# Patient Record
Sex: Female | Born: 1940 | Race: Black or African American | Hispanic: No | Smoking: Never smoker
Health system: Southern US, Community
[De-identification: ages and names within clinical notes are randomized; demographics above are authoritative.]

## PROBLEM LIST (undated history)

## (undated) ENCOUNTER — Emergency Department (HOSPITAL_COMMUNITY): Admission: EM | Payer: Medicare PPO | Source: Home / Self Care

## (undated) DIAGNOSIS — E785 Hyperlipidemia, unspecified: Secondary | ICD-10-CM

## (undated) DIAGNOSIS — I10 Essential (primary) hypertension: Secondary | ICD-10-CM

## (undated) HISTORY — PX: TUBAL LIGATION: SHX77

---

## 1999-07-17 ENCOUNTER — Other Ambulatory Visit: Admission: RE | Admit: 1999-07-17 | Discharge: 1999-07-17 | Payer: Self-pay | Admitting: Family Medicine

## 2000-02-20 ENCOUNTER — Encounter: Admission: RE | Admit: 2000-02-20 | Discharge: 2000-02-20 | Payer: Self-pay | Admitting: Family Medicine

## 2000-02-20 ENCOUNTER — Encounter: Payer: Self-pay | Admitting: Family Medicine

## 2000-05-27 ENCOUNTER — Encounter: Admission: RE | Admit: 2000-05-27 | Discharge: 2000-05-27 | Payer: Self-pay | Admitting: Family Medicine

## 2000-05-27 ENCOUNTER — Encounter: Payer: Self-pay | Admitting: Family Medicine

## 2000-06-04 ENCOUNTER — Encounter: Payer: Self-pay | Admitting: Family Medicine

## 2000-06-04 ENCOUNTER — Encounter: Admission: RE | Admit: 2000-06-04 | Discharge: 2000-06-04 | Payer: Self-pay | Admitting: Family Medicine

## 2000-12-08 ENCOUNTER — Other Ambulatory Visit: Admission: RE | Admit: 2000-12-08 | Discharge: 2000-12-08 | Payer: Self-pay | Admitting: Family Medicine

## 2001-12-10 ENCOUNTER — Encounter: Payer: Self-pay | Admitting: Family Medicine

## 2001-12-10 ENCOUNTER — Encounter: Admission: RE | Admit: 2001-12-10 | Discharge: 2001-12-10 | Payer: Self-pay | Admitting: Family Medicine

## 2002-08-25 ENCOUNTER — Encounter: Payer: Self-pay | Admitting: Family Medicine

## 2002-08-25 ENCOUNTER — Encounter: Admission: RE | Admit: 2002-08-25 | Discharge: 2002-08-25 | Payer: Self-pay | Admitting: Family Medicine

## 2003-05-26 ENCOUNTER — Encounter: Payer: Self-pay | Admitting: Family Medicine

## 2003-05-26 ENCOUNTER — Encounter: Admission: RE | Admit: 2003-05-26 | Discharge: 2003-05-26 | Payer: Self-pay | Admitting: Family Medicine

## 2003-08-22 ENCOUNTER — Encounter: Admission: RE | Admit: 2003-08-22 | Discharge: 2003-08-22 | Payer: Self-pay | Admitting: Family Medicine

## 2003-08-30 ENCOUNTER — Ambulatory Visit (HOSPITAL_COMMUNITY): Admission: RE | Admit: 2003-08-30 | Discharge: 2003-08-30 | Payer: Self-pay | Admitting: *Deleted

## 2003-09-26 ENCOUNTER — Encounter: Admission: RE | Admit: 2003-09-26 | Discharge: 2003-09-26 | Payer: Self-pay | Admitting: *Deleted

## 2004-10-30 ENCOUNTER — Ambulatory Visit (HOSPITAL_COMMUNITY): Admission: RE | Admit: 2004-10-30 | Discharge: 2004-10-30 | Payer: Self-pay | Admitting: Orthopedic Surgery

## 2004-10-30 ENCOUNTER — Ambulatory Visit (HOSPITAL_BASED_OUTPATIENT_CLINIC_OR_DEPARTMENT_OTHER): Admission: RE | Admit: 2004-10-30 | Discharge: 2004-10-30 | Payer: Self-pay | Admitting: Orthopedic Surgery

## 2005-09-09 ENCOUNTER — Encounter: Admission: RE | Admit: 2005-09-09 | Discharge: 2005-09-09 | Payer: Self-pay | Admitting: Family Medicine

## 2005-11-10 ENCOUNTER — Encounter: Admission: RE | Admit: 2005-11-10 | Discharge: 2005-11-10 | Payer: Self-pay | Admitting: Family Medicine

## 2006-11-10 ENCOUNTER — Emergency Department (HOSPITAL_COMMUNITY): Admission: EM | Admit: 2006-11-10 | Discharge: 2006-11-10 | Payer: Self-pay | Admitting: Emergency Medicine

## 2008-12-21 ENCOUNTER — Encounter: Admission: RE | Admit: 2008-12-21 | Discharge: 2008-12-21 | Payer: Self-pay | Admitting: Family Medicine

## 2010-08-13 ENCOUNTER — Emergency Department (HOSPITAL_COMMUNITY): Admission: EM | Admit: 2010-08-13 | Discharge: 2010-08-13 | Payer: Self-pay | Admitting: Emergency Medicine

## 2010-11-17 ENCOUNTER — Encounter: Payer: Self-pay | Admitting: Family Medicine

## 2011-02-18 ENCOUNTER — Other Ambulatory Visit: Payer: Self-pay | Admitting: Gastroenterology

## 2011-02-20 ENCOUNTER — Ambulatory Visit
Admission: RE | Admit: 2011-02-20 | Discharge: 2011-02-20 | Disposition: A | Discharge status: Home / Self Care | Payer: Medicare Other | Source: Ambulatory Visit | Attending: Gastroenterology | Admitting: Gastroenterology

## 2011-02-20 MED ORDER — IOHEXOL 300 MG/ML  SOLN
125.0000 mL | Freq: Once | INTRAMUSCULAR | Status: AC | PRN
  Administered 2011-02-20: 125 mL via INTRAVENOUS

## 2011-02-28 ENCOUNTER — Other Ambulatory Visit: Payer: Self-pay

## 2011-03-12 ENCOUNTER — Ambulatory Visit (HOSPITAL_COMMUNITY)
Admission: RE | Admit: 2011-03-12 | Discharge: 2011-03-12 | Disposition: A | Discharge status: Home / Self Care | Payer: Medicare Other | Source: Ambulatory Visit | Attending: Gastroenterology | Admitting: Gastroenterology

## 2011-03-12 DIAGNOSIS — K8020 Calculus of gallbladder without cholecystitis without obstruction: Secondary | ICD-10-CM | POA: Insufficient documentation

## 2011-03-12 DIAGNOSIS — I10 Essential (primary) hypertension: Secondary | ICD-10-CM | POA: Insufficient documentation

## 2011-03-12 DIAGNOSIS — E669 Obesity, unspecified: Secondary | ICD-10-CM | POA: Insufficient documentation

## 2011-03-12 DIAGNOSIS — K838 Other specified diseases of biliary tract: Secondary | ICD-10-CM | POA: Insufficient documentation

## 2011-03-12 DIAGNOSIS — K319 Disease of stomach and duodenum, unspecified: Secondary | ICD-10-CM | POA: Insufficient documentation

## 2011-03-12 DIAGNOSIS — E7800 Pure hypercholesterolemia, unspecified: Secondary | ICD-10-CM | POA: Insufficient documentation

## 2011-03-12 DIAGNOSIS — R1084 Generalized abdominal pain: Secondary | ICD-10-CM | POA: Insufficient documentation

## 2011-03-12 DIAGNOSIS — R1310 Dysphagia, unspecified: Secondary | ICD-10-CM | POA: Insufficient documentation

## 2011-03-14 NOTE — Op Note (Signed)
NAMEJESSIKA, Samantha Gonzales               ACCOUNT NO.:  [id number]   MEDICAL RECORD NO.:  [id number]          PATIENT TYPE:  AMB   LOCATION:  DSC                          FACILITY:  [facility]   PHYSICIAN:  Harvie Junior, M.D.   DATE OF BIRTH:  May 12, 1941   DATE OF PROCEDURE:  10/31/2003  DATE OF DISCHARGE:                                 OPERATIVE REPORT   PREOPERATIVE DIAGNOSIS:  Medial meniscal tear.   POSTOPERATIVE DIAGNOSES:  1.  Medial meniscal tear with corresponding chondromalacia of the medial      femoral condyle.  2.  Chondromalacia of the patellofemoral joint.   PRINCIPAL PROCEDURE:  1.  Partial posterior horn medial meniscectomy with corresponding      debridement of the medial compartment.  2.  Debridement of chondromalacia of the patellofemoral compartment,      specifically, the medial patella facet.   SURGEON:  Harvie Junior, M.D.   ASSISTANT:  Marshia Ly, P.A.   ANESTHESIA:  General.   BRIEF HISTORY:  She is a 70 year old female with a long history of having  had right knee pain.  She ultimately will be evaluated with MRI which showed  that she had a posterior horn medial meniscal tear.  I tried her  conservatively with anti-inflammatory medication, activity modification,  steroid injection.  All this failed and, because of continuing complaints of  pain, she will also be taken to the operating room for operative knee  arthroscopy.   DESCRIPTION OF PROCEDURE:  The patient was taken to the operating room.  After adequate anesthesia was obtained with general anesthetic, the patient  was placed on the operating table.  The right leg was prepped and draped in  the usual sterile fashion.  Following this, routine arthroscopic examination  of the knee revealed that there was an obvious posterior horn medial  meniscal tear.  This was debrided back to a smooth and stable rim.  It is a  complex tear which really went almost back to the posterior rim.  Once it  was  debrided, a probe was used to make sure that the meniscus was stable.  The suction shaver was used to contour the rim.  Attention was then turned  to the ACL normal, lateral side normal.  Attention turned to the  patellofemoral joint in the patellofemoral area.  There was a little bit of  a medial plica which was protecting what appeared to be some chondromalacia  of the medial patella facet.  This was debrided with a suction shaver back  to smooth and stable rim.  At this point, the knee was copiously  irrigated, suctioned, and dried.  The arthroscopic portals were closed with  a bandage.  A sterile and compressive dressing was applied and the patient  was taken to the recovery room.  She was noted to be in satisfactory  condition.  Estimated blood loss for the procedure was none.      JLG/MEDQ  D:  10/30/2004  T:  10/30/2004  Job:  [id number]

## 2011-05-19 ENCOUNTER — Ambulatory Visit
Admission: RE | Admit: 2011-05-19 | Discharge: 2011-05-19 | Disposition: A | Discharge status: Home / Self Care | Payer: Medicare Other | Source: Ambulatory Visit | Attending: Gastroenterology | Admitting: Gastroenterology

## 2011-12-25 ENCOUNTER — Ambulatory Visit (HOSPITAL_COMMUNITY)
Admission: RE | Admit: 2011-12-25 | Discharge: 2011-12-25 | Disposition: A | Discharge status: Home / Self Care | Payer: Medicare Other | Source: Ambulatory Visit | Attending: Internal Medicine | Admitting: Internal Medicine

## 2011-12-25 DIAGNOSIS — M79609 Pain in unspecified limb: Secondary | ICD-10-CM

## 2011-12-25 DIAGNOSIS — R609 Edema, unspecified: Secondary | ICD-10-CM

## 2011-12-25 DIAGNOSIS — R52 Pain, unspecified: Secondary | ICD-10-CM

## 2011-12-25 NOTE — Progress Notes (Signed)
VASCULAR LAB PRELIMINARY  PRELIMINARY  PRELIMINARY  PRELIMINARY  Left lower extremity venous duplex completed.    Preliminary report: Left leg is negative for superficial and deep vein thrombosis. Vanna Scotland, 12/25/2011, 7:01 PM

## 2013-11-03 ENCOUNTER — Other Ambulatory Visit (HOSPITAL_COMMUNITY): Payer: Self-pay | Admitting: Internal Medicine

## 2013-11-03 DIAGNOSIS — Z1231 Encounter for screening mammogram for malignant neoplasm of breast: Secondary | ICD-10-CM

## 2013-11-04 ENCOUNTER — Ambulatory Visit (HOSPITAL_COMMUNITY)
Admission: RE | Admit: 2013-11-04 | Discharge: 2013-11-04 | Disposition: A | Discharge status: Home / Self Care | Payer: Medicare HMO | Source: Ambulatory Visit | Attending: Internal Medicine | Admitting: Internal Medicine

## 2013-11-04 DIAGNOSIS — Z1231 Encounter for screening mammogram for malignant neoplasm of breast: Secondary | ICD-10-CM | POA: Insufficient documentation

## 2014-02-07 ENCOUNTER — Emergency Department (INDEPENDENT_AMBULATORY_CARE_PROVIDER_SITE_OTHER): Payer: Medicare HMO

## 2014-02-07 ENCOUNTER — Encounter (HOSPITAL_COMMUNITY): Payer: Self-pay | Admitting: Emergency Medicine

## 2014-02-07 ENCOUNTER — Emergency Department (INDEPENDENT_AMBULATORY_CARE_PROVIDER_SITE_OTHER)
Admission: EM | Admit: 2014-02-07 | Discharge: 2014-02-07 | Disposition: A | Discharge status: Home / Self Care | Payer: Self-pay | Source: Home / Self Care | Attending: Family Medicine | Admitting: Family Medicine

## 2014-02-07 DIAGNOSIS — S5000XA Contusion of unspecified elbow, initial encounter: Secondary | ICD-10-CM

## 2014-02-07 DIAGNOSIS — S8000XA Contusion of unspecified knee, initial encounter: Secondary | ICD-10-CM

## 2014-02-07 DIAGNOSIS — Y92009 Unspecified place in unspecified non-institutional (private) residence as the place of occurrence of the external cause: Secondary | ICD-10-CM

## 2014-02-07 DIAGNOSIS — W19XXXA Unspecified fall, initial encounter: Secondary | ICD-10-CM

## 2014-02-07 HISTORY — DX: Essential (primary) hypertension: I10

## 2014-02-07 MED ORDER — NAPROXEN 500 MG PO TABS: 500.0000 mg | ORAL_TABLET | Freq: Two times a day (BID) | ORAL | Status: DC

## 2014-02-07 MED ORDER — TRAMADOL HCL 50 MG PO TABS: 50.0000 mg | ORAL_TABLET | Freq: Four times a day (QID) | ORAL | Status: DC | PRN

## 2014-02-07 NOTE — Discharge Instructions (Signed)

## 2014-02-07 NOTE — ED Provider Notes (Signed)
CSN: [id number]     Arrival date & time 02/07/14  1441 History   First MD Initiated Contact with Patient 02/07/14 1616     Chief Complaint  Patient presents with  . Fall   (Consider location/radiation/quality/duration/timing/severity/associated sxs/prior Treatment) HPI Comments: 73 year old female presents complaining of right knee pain, left elbow pain after falling in her hallway earlier today. She was walking when her foot caught on the ground and she fell. She had immediate pain in the knee and elbow, as well as a slight pain in her head that has now resolved. She has been ambulatory but with severe pain. She has no numbness anywhere. The right knee seems to be swelling. No other injuries. No loss of consciousness. No NVD or blurry vision.  Patient is a 73 y.o. female presenting with fall.  Fall Associated symptoms include headaches (resolved).    Past Medical History  Diagnosis Date  . Hypertension    History reviewed. No pertinent past surgical history. History reviewed. No pertinent family history. History  Substance Use Topics  . Smoking status: Never Smoker   . Smokeless tobacco: Not on file  . Alcohol Use: No   OB History   Grav Para Term Preterm Abortions TAB SAB Ect Mult Living                 Review of Systems  Eyes: Negative for visual disturbance.  Gastrointestinal: Negative for nausea and vomiting.  Musculoskeletal:       See HPI  Neurological: Positive for headaches (resolved). Negative for dizziness.  All other systems reviewed and are negative.   Allergies  Penicillins  Home Medications   Prior to Admission medications   Not on File   BP 155/80  Pulse 76  Temp(Src) 98.7 F (37.1 C) (Oral)  Resp 18  SpO2 100% Physical Exam  Nursing note and vitals reviewed. Constitutional: She is oriented to person, place, and time. Vital signs are normal. She appears well-developed and well-nourished. She is active and cooperative. She does not appear ill.  No distress.  HENT:  Head: Normocephalic and atraumatic.  Pulmonary/Chest: Effort normal. No respiratory distress.  Musculoskeletal:       Left elbow: She exhibits normal range of motion, no swelling and no effusion. Tenderness found. Olecranon process tenderness noted.       Right knee: She exhibits decreased range of motion, swelling (diffuse, worse over patella), ecchymosis (prepatellar), abnormal patellar mobility and bony tenderness (patella). She exhibits normal alignment, no LCL laxity and no MCL laxity. Tenderness found. Medial joint line, lateral joint line and patellar tendon tenderness noted.  Neurological: She is alert and oriented to person, place, and time. She has normal strength. No cranial nerve deficit or sensory deficit. She exhibits normal muscle tone. Coordination normal. GCS eye subscore is 4. GCS verbal subscore is 5. GCS motor subscore is 6.  Skin: Skin is warm and dry. No rash noted. She is not diaphoretic.  Psychiatric: She has a normal mood and affect. Judgment normal.    ED Course  Procedures (including critical care time) Labs Review Labs Reviewed - No data to display  No results found for this or any previous visit. Imaging Review Dg Elbow Complete Left  02/07/2014   CLINICAL DATA:  Left elbow pain status post fall.  EXAM: LEFT ELBOW - COMPLETE 3+ VIEW  COMPARISON:  None.  FINDINGS: The radial head appears intact. The olecranon is also intact. There is a small olecranon spur. The supracondylar region exhibits no acute  abnormality. There is minimal spurring from the medial humeral epicondyles. There is no joint effusion.  IMPRESSION: There is no evidence of an acute fracture of the bones of the elbow. There is small olecranon spur.   Electronically Signed   By: David  SwazilandJordan   On: 02/07/2014 16:51   Dg Knee Complete 4 Views Right  02/07/2014   CLINICAL DATA:  Fall.  EXAM: RIGHT KNEE - COMPLETE 4+ VIEW  COMPARISON:  None.  FINDINGS: There is no evidence of fracture,  dislocation, or joint effusion. There are mild degenerative changes involving the right knee including medial compartment narrowing and marginal spur formation. Soft tissues are unremarkable.  IMPRESSION: 1. Osteoarthritis. 2. No acute findings.   Electronically Signed   By: Signa Kellaylor  Stroud M.D.   On: 02/07/2014 17:04     MDM   1. Fall   2. Knee contusion   3. Elbow contusion    X-rays negative. Treating symptomatically. RICE. Followup if no improvement in the week   Meds ordered this encounter  Medications  . naproxen (NAPROSYN) 500 MG tablet    Sig: Take 1 tablet (500 mg total) by mouth 2 (two) times daily.    Dispense:  30 tablet    Refill:  0    Order Specific Question:  Supervising Provider    Answer:  Linna HoffKINDL, JAMES D [id number]]  . traMADol (ULTRAM) 50 MG tablet    Sig: Take 1 tablet (50 mg total) by mouth every 6 (six) hours as needed.    Dispense:  15 tablet    Refill:  0    Order Specific Question:  Supervising Provider    Answer:  Bradd CanaryKINDL, JAMES D [[id number]]       Graylon GoodZachary H Dustin Bumbaugh, PA-C 02/07/14 1723

## 2014-02-07 NOTE — ED Notes (Signed)
Reports walking down hall way and falling to the floor.  Denies tripping over anything.   Pain in right knee and left elbow.  Incident happened today.

## 2014-02-10 NOTE — ED Provider Notes (Signed)
Medical screening examination/treatment/procedure(s) were performed by resident physician or non-physician practitioner and as supervising physician I was immediately available for consultation/collaboration.   Barkley BrunsKINDL,Zhania Shaheen DOUGLAS MD.   Linna HoffJames D Coulton Schlink, MD 02/10/14 [phone]

## 2014-11-01 ENCOUNTER — Other Ambulatory Visit: Payer: Self-pay | Admitting: Pain Medicine

## 2014-11-01 DIAGNOSIS — M545 Low back pain, unspecified: Secondary | ICD-10-CM

## 2014-11-01 DIAGNOSIS — M4686 Other specified inflammatory spondylopathies, lumbar region: Secondary | ICD-10-CM

## 2014-11-01 DIAGNOSIS — M79606 Pain in leg, unspecified: Secondary | ICD-10-CM

## 2014-11-02 ENCOUNTER — Ambulatory Visit
Admission: RE | Admit: 2014-11-02 | Discharge: 2014-11-02 | Disposition: A | Discharge status: Home / Self Care | Payer: Medicare HMO | Source: Ambulatory Visit | Attending: Pain Medicine | Admitting: Pain Medicine

## 2014-11-02 DIAGNOSIS — M79606 Pain in leg, unspecified: Secondary | ICD-10-CM

## 2014-11-02 DIAGNOSIS — M545 Low back pain, unspecified: Secondary | ICD-10-CM

## 2014-11-02 DIAGNOSIS — M4686 Other specified inflammatory spondylopathies, lumbar region: Secondary | ICD-10-CM

## 2015-04-20 ENCOUNTER — Encounter: Payer: Self-pay | Admitting: Gastroenterology

## 2015-09-27 ENCOUNTER — Other Ambulatory Visit: Payer: Self-pay | Admitting: Gastroenterology

## 2015-09-27 DIAGNOSIS — R1084 Generalized abdominal pain: Secondary | ICD-10-CM

## 2015-09-27 DIAGNOSIS — R634 Abnormal weight loss: Secondary | ICD-10-CM

## 2015-09-28 ENCOUNTER — Ambulatory Visit
Admission: RE | Admit: 2015-09-28 | Discharge: 2015-09-28 | Disposition: A | Discharge status: Home / Self Care | Payer: Medicare HMO | Source: Ambulatory Visit | Attending: Gastroenterology | Admitting: Gastroenterology

## 2015-09-28 DIAGNOSIS — R634 Abnormal weight loss: Secondary | ICD-10-CM

## 2015-09-28 DIAGNOSIS — R1084 Generalized abdominal pain: Secondary | ICD-10-CM

## 2015-09-28 MED ORDER — IOPAMIDOL (ISOVUE-300) INJECTION 61%: 100.0000 mL | Freq: Once | INTRAVENOUS | Status: DC | PRN

## 2018-01-17 ENCOUNTER — Emergency Department (HOSPITAL_COMMUNITY): Payer: Medicare Other

## 2018-01-17 ENCOUNTER — Other Ambulatory Visit: Payer: Self-pay

## 2018-01-17 ENCOUNTER — Inpatient Hospital Stay (HOSPITAL_COMMUNITY)
Admission: EM | Admit: 2018-01-17 | Discharge: 2018-01-20 | DRG: 641 | Disposition: A | Discharge status: Home / Self Care | Payer: Medicare Other | Attending: Internal Medicine | Admitting: Internal Medicine

## 2018-01-17 ENCOUNTER — Encounter (HOSPITAL_COMMUNITY): Payer: Self-pay | Admitting: Emergency Medicine

## 2018-01-17 ENCOUNTER — Observation Stay (HOSPITAL_COMMUNITY): Payer: Medicare Other

## 2018-01-17 DIAGNOSIS — R4182 Altered mental status, unspecified: Secondary | ICD-10-CM | POA: Diagnosis not present

## 2018-01-17 DIAGNOSIS — Y92009 Unspecified place in unspecified non-institutional (private) residence as the place of occurrence of the external cause: Secondary | ICD-10-CM

## 2018-01-17 DIAGNOSIS — M549 Dorsalgia, unspecified: Secondary | ICD-10-CM | POA: Diagnosis present

## 2018-01-17 DIAGNOSIS — Z880 Allergy status to penicillin: Secondary | ICD-10-CM

## 2018-01-17 DIAGNOSIS — N179 Acute kidney failure, unspecified: Secondary | ICD-10-CM | POA: Diagnosis present

## 2018-01-17 DIAGNOSIS — G8929 Other chronic pain: Secondary | ICD-10-CM | POA: Diagnosis present

## 2018-01-17 DIAGNOSIS — E519 Thiamine deficiency, unspecified: Secondary | ICD-10-CM | POA: Diagnosis present

## 2018-01-17 DIAGNOSIS — E538 Deficiency of other specified B group vitamins: Principal | ICD-10-CM | POA: Diagnosis present

## 2018-01-17 DIAGNOSIS — R269 Unspecified abnormalities of gait and mobility: Secondary | ICD-10-CM

## 2018-01-17 DIAGNOSIS — R634 Abnormal weight loss: Secondary | ICD-10-CM | POA: Diagnosis present

## 2018-01-17 DIAGNOSIS — R410 Disorientation, unspecified: Secondary | ICD-10-CM

## 2018-01-17 DIAGNOSIS — Z79891 Long term (current) use of opiate analgesic: Secondary | ICD-10-CM

## 2018-01-17 DIAGNOSIS — R262 Difficulty in walking, not elsewhere classified: Secondary | ICD-10-CM | POA: Diagnosis present

## 2018-01-17 DIAGNOSIS — R270 Ataxia, unspecified: Secondary | ICD-10-CM | POA: Diagnosis present

## 2018-01-17 DIAGNOSIS — R109 Unspecified abdominal pain: Secondary | ICD-10-CM | POA: Diagnosis present

## 2018-01-17 DIAGNOSIS — T50905A Adverse effect of unspecified drugs, medicaments and biological substances, initial encounter: Secondary | ICD-10-CM | POA: Diagnosis present

## 2018-01-17 DIAGNOSIS — F17220 Nicotine dependence, chewing tobacco, uncomplicated: Secondary | ICD-10-CM | POA: Diagnosis present

## 2018-01-17 DIAGNOSIS — Z79899 Other long term (current) drug therapy: Secondary | ICD-10-CM

## 2018-01-17 DIAGNOSIS — R52 Pain, unspecified: Secondary | ICD-10-CM

## 2018-01-17 DIAGNOSIS — I10 Essential (primary) hypertension: Secondary | ICD-10-CM | POA: Diagnosis present

## 2018-01-17 LAB — TROPONIN I: Troponin I: <0.03 ng/mL (ref <0.03)

## 2018-01-17 LAB — COMPREHENSIVE METABOLIC PANEL
ALBUMIN: 3.5 g/dL (ref 3.5–5.0)
ALK PHOS: 71 U/L (ref 38–126)
ALT: 11 U/L — ABNORMAL LOW (ref 14–54)
AST: 24 U/L (ref 15–41)
Anion gap: 11 (ref 5–15)
BILIRUBIN TOTAL: 0.6 mg/dL (ref 0.3–1.2)
BUN: 17 mg/dL (ref 6–20)
CALCIUM: 9.3 mg/dL (ref 8.9–10.3)
CO2: 25 mmol/L (ref 22–32)
Chloride: 103 mmol/L (ref 101–111)
Creatinine, Ser: 1.37 mg/dL — ABNORMAL HIGH (ref 0.44–1.00)
GFR calc Af Amer: 43 mL/min — ABNORMAL LOW (ref >60)
GFR calc non Af Amer: 37 mL/min — ABNORMAL LOW (ref >60)
GLUCOSE: 107 mg/dL — AB (ref 65–99)
POTASSIUM: 3.5 mmol/L (ref 3.5–5.1)
Sodium: 139 mmol/L (ref 135–145)
TOTAL PROTEIN: 6.6 g/dL (ref 6.5–8.1)

## 2018-01-17 LAB — URINALYSIS, ROUTINE W REFLEX MICROSCOPIC
Bilirubin Urine: NEGATIVE
Glucose, UA: NEGATIVE mg/dL
Hgb urine dipstick: NEGATIVE
Ketones, ur: NEGATIVE mg/dL
Nitrite: NEGATIVE
Protein, ur: 30 mg/dL — AB
Specific Gravity, Urine: 1.023 (ref 1.005–1.030)
pH: 5 (ref 5.0–8.0)

## 2018-01-17 LAB — CBC
HCT: 39 % (ref 36.0–46.0)
Hemoglobin: 12.9 g/dL (ref 12.0–15.0)
MCH: 32.9 pg (ref 26.0–34.0)
MCHC: 33.1 g/dL (ref 30.0–36.0)
MCV: 99.5 fL (ref 78.0–100.0)
Platelets: 207 10*3/uL (ref 150–400)
RBC: 3.92 MIL/uL (ref 3.87–5.11)
RDW: 14 % (ref 11.5–15.5)
WBC: 7.6 10*3/uL (ref 4.0–10.5)

## 2018-01-17 LAB — I-STAT CG4 LACTIC ACID, ED: LACTIC ACID, VENOUS: 1.04 mmol/L (ref 0.5–1.9)

## 2018-01-17 LAB — AMMONIA: Ammonia: 53 umol/L — ABNORMAL HIGH (ref 9–35)

## 2018-01-17 MED ORDER — SENNOSIDES-DOCUSATE SODIUM 8.6-50 MG PO TABS: 1.0000 | ORAL_TABLET | Freq: Every evening | ORAL | Status: DC | PRN

## 2018-01-17 MED ORDER — TRAMADOL HCL 50 MG PO TABS
50.0000 mg | ORAL_TABLET | Freq: Once | ORAL | Status: AC
  Administered 2018-01-17: 50 mg via ORAL
  Filled 2018-01-17: qty 1

## 2018-01-17 MED ORDER — STROKE: EARLY STAGES OF RECOVERY BOOK
Freq: Once | Status: AC
  Administered 2018-01-17: 21:00:00

## 2018-01-17 MED ORDER — HEPARIN SODIUM (PORCINE) 5000 UNIT/ML IJ SOLN
5000.0000 [IU] | Freq: Three times a day (TID) | INTRAMUSCULAR | Status: DC
  Administered 2018-01-17 – 2018-01-20 (×8): 5000 [IU] via SUBCUTANEOUS
  Filled 2018-01-17 (×8): qty 1

## 2018-01-17 MED ORDER — SODIUM CHLORIDE 0.9 % IV SOLN
INTRAVENOUS | Status: DC
  Administered 2018-01-17: 21:00:00 via INTRAVENOUS

## 2018-01-17 MED ORDER — PANTOPRAZOLE SODIUM 40 MG IV SOLR
40.0000 mg | INTRAVENOUS | Status: DC
  Administered 2018-01-17 – 2018-01-18 (×2): 40 mg via INTRAVENOUS
  Filled 2018-01-17 (×2): qty 40

## 2018-01-17 MED ORDER — SODIUM CHLORIDE 0.9 % IV SOLN
INTRAVENOUS | Status: DC
  Administered 2018-01-17: 250 mL/h via INTRAVENOUS

## 2018-01-17 MED ORDER — ACETAMINOPHEN 500 MG PO TABS
1000.0000 mg | ORAL_TABLET | Freq: Three times a day (TID) | ORAL | Status: DC
  Administered 2018-01-17 – 2018-01-18 (×2): 1000 mg via ORAL
  Filled 2018-01-17 (×2): qty 2

## 2018-01-17 NOTE — ED Notes (Signed)
Attempted report 

## 2018-01-17 NOTE — ED Provider Notes (Signed)
[facility] EMERGENCY DEPARTMENT Provider Note   CSN: [id number] Arrival date & time: 01/17/18  1033     History   Chief Complaint Chief Complaint  Patient presents with  . Altered Mental Status    HPI Samantha Gonzales is a 77 y.o. female.  HPI Patient presents to the emergency department with unsteady gait and altered mental status.  The daughter gives the history that the patient seems confused and she is not able to walk over the last 2 days.  Patient states that she is only having back pain at this time.  She denies any others symptoms.  The patient denies chest pain, shortness of breath, headache,blurred vision, neck pain, fever, cough,numbness, dizziness, anorexia, edema, abdominal pain, nausea, vomiting, diarrhea, rash, back pain, dysuria, hematemesis, bloody stool, near syncope, or syncope. Past Medical History:  Diagnosis Date  . Hypertension     There are no active problems to display for this patient.   History reviewed. No pertinent surgical history.   OB History   None      Home Medications    Prior to Admission medications   Medication Sig Start Date End Date Taking? Authorizing Provider  HYDROcodone-acetaminophen (NORCO) 7.5-325 MG tablet Take 1 tablet by mouth 3 (three) times daily as needed for pain. 12/24/17  Yes [provider]  naproxen (NAPROSYN) 500 MG tablet Take 1 tablet (500 mg total) by mouth 2 (two) times daily. Patient not taking: Reported on 01/17/2018 02/07/14   Graylon Good, PA-C  traMADol (ULTRAM) 50 MG tablet Take 1 tablet (50 mg total) by mouth every 6 (six) hours as needed. Patient not taking: Reported on 01/17/2018 02/07/14   Graylon Good, PA-C    Family History No family history on file.  Social History Social History   Tobacco Use  . Smoking status: Never Smoker  . Smokeless tobacco: Never Used  Substance Use Topics  . Alcohol use: No  . Drug use: No     Allergies    Penicillins   Review of Systems Review of Systems All other systems negative except as documented in the HPI. All pertinent positives and negatives as reviewed in the HPI.  Physical Exam Updated Vital Signs BP (!) 144/70 (BP Location: Right Arm)   Pulse (!) 46   Temp 98.4 F (36.9 C) (Oral)   Resp 14   SpO2 98%   Physical Exam  Constitutional: She is oriented to person, place, and time. She appears well-developed and well-nourished. No distress.  HENT:  Head: Normocephalic and atraumatic.  Mouth/Throat: Oropharynx is clear and moist.  Eyes: Pupils are equal, round, and reactive to light.  Neck: Normal range of motion. Neck supple.  Cardiovascular: Normal rate, regular rhythm and normal heart sounds. Exam reveals no gallop and no friction rub.  No murmur heard. Pulmonary/Chest: Effort normal and breath sounds normal. No respiratory distress. She has no wheezes.  Abdominal: Soft. Bowel sounds are normal. She exhibits no distension. There is no tenderness.  Neurological: She is alert and oriented to person, place, and time. She exhibits normal muscle tone. Coordination normal.  Skin: Skin is warm and dry. Capillary refill takes less than 2 seconds. No rash noted. No erythema.  Psychiatric: She has a normal mood and affect. Her behavior is normal.  Nursing note and vitals reviewed.    ED Treatments / Results  Labs (all labs ordered are listed, but only abnormal results are displayed) Labs Reviewed  COMPREHENSIVE METABOLIC PANEL - Abnormal; Notable  for the following components:      Result Value   Glucose, Bld 107 (*)    Creatinine, Ser 1.37 (*)    ALT 11 (*)    GFR calc non Af Amer 37 (*)    GFR calc Af Amer 43 (*)    All other components within normal limits  URINE CULTURE  CBC  AMMONIA  URINALYSIS, ROUTINE W REFLEX MICROSCOPIC  CBG MONITORING, ED  I-STAT CG4 LACTIC ACID, ED  I-STAT CG4 LACTIC ACID, ED    EKG None  Radiology Dg Chest 2 View  Result Date:  01/17/2018 CLINICAL DATA:  Balance problems with altered mental status 2 days. EXAM: CHEST - 2 VIEW COMPARISON:  None. FINDINGS: Lungs are adequately inflated without focal airspace consolidation or effusion. Mild cardiomegaly. Calcified plaque over the thoracic aorta. Degenerative change of the spine and shoulders. IMPRESSION: No acute cardiopulmonary disease. Mild cardiomegaly. Electronically Signed   By: Elberta Fortisaniel  Boyle M.D.   On: 01/17/2018 15:54   Dg Cervical Spine Complete  Result Date: 01/17/2018 CLINICAL DATA:  Altered mental status 2 days. EXAM: CERVICAL SPINE - COMPLETE 4+ VIEW COMPARISON:  None. FINDINGS: Vertebral body alignment and heights are normal. There is moderate spondylosis throughout the cervical spine. There is mild disc space narrowing at the C3-4, C4-5, C5-6 and C6-7 levels. Prevertebral soft tissues are within normal. There is moderate uncovertebral joint spurring and facet arthropathy. There is suboptimal positioning for proper evaluation of the right-sided neural foramina. There is moderate narrowing of the left neural foramina at the C4-5 and C6-7 levels. Atlantoaxial articulation is within normal. Hardware is present over the mandible. Two metallic densities project over the region of the left maxilla. IMPRESSION: No acute findings. 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 Suboptimal positioning for accurate evaluation of the right neural foramina. Electronically Signed   By: Elberta Fortisaniel  Boyle M.D.   On: 01/17/2018 15:57   Ct Head Wo Contrast  Result Date: 01/17/2018 CLINICAL DATA:  Altered mental status EXAM: CT HEAD WITHOUT CONTRAST TECHNIQUE: Contiguous axial images were obtained from the base of the skull through the vertex without intravenous contrast. COMPARISON:  None. FINDINGS: Brain: Mild atrophic changes are noted. No findings to suggest acute  hemorrhage, acute infarction or mass lesion are noted. Vascular: No hyperdense vessel or unexpected calcification. Skull: Normal. Negative for fracture or focal lesion. Sinuses/Orbits: No acute finding. Other: None. IMPRESSION: Mild atrophic changes without acute abnormality. Electronically Signed   By: Alcide CleverMark  Lukens M.D.   On: 01/17/2018 15:59    Procedures Procedures (including critical care time)  Medications Ordered in ED Medications  0.9 %  sodium chloride infusion (has no administration in time range)     Initial Impression / Assessment and Plan / ED Course  I have reviewed the triage vital signs and the nursing notes.  Pertinent labs & imaging results that were available during my care of the patient were reviewed by me and considered in my medical decision making (see chart for details).     Patient was not able to ambulate and there is a concern due to the fact that she has had some dizziness along with inability to walk that she has had a stroke.  I did consult the hospitalist who will see the patient I did order an MRI of her brain due to the concern for stroke.  Patient will be admitted and advised them  of the plan. Final Clinical Impressions(s) / ED Diagnoses   Final diagnoses:  None    ED Discharge Orders    None       Charlestine Night, PA-C 01/18/18 0015    Rolan Bucco, MD 01/18/18 [phone]

## 2018-01-17 NOTE — H&P (Signed)
History and Physical   Samantha Gonzales:[id number] DOB: Dec 15, 1940 DOA: 01/17/2018  PCP: System, Pcp Not In  Chief Complaint: confusion  HPI: this is a 77 year old woman with medical problems including chronic back pain on opioid therapy, chronic abdominal pain, hypertension, and longstanding chewing tobacco user presenting with 2 day history of confusion and difficulty walking.  Note history is obtained by the patient's family at bedside including her daughter and her sister, patient's own account is somewhat limited due to her cognitive state.  At baseline, the patient is independent in her ADLs and IADLs. She walks without assistance. However, 2 days ago she suffered an unwitnessed fall, he fell on her bilateral knees. Since then she's been unsteady on her feet, leaning against household objects to maintain her balance. Has had significant difficulty ambulating. Family reports that she's been somewhat confused, not oriented to year. Her conversation at times has not been appropriate, and she's been noted to be stumbling, family reports that she's also been dizzy. The patient cannot report that she takes for her medications, but does report she is followed by pain management clinic and takes a medication prescribed to 3 times daily. She denies any recent increase in her pain medications that are prescribed however, she does report chronic abdominal pain that she takes frequent over-the-counter medications including NSAIDs. She does not drink alcohol.  Family at bedside to report that she's been evaluated with an EGD as well as colonoscopy, this was over 1 year ago and did not reveal any acute abnormalities. The patient denies any fevers, chills, chest pain, shortness of breath, nausea, vomiting. Her bowel pain is poorly localized, she has bowel movements to 3 times weekly, describes soft.She takes a laxative as needed. She reports back pain as thoracic in location, rates a 5 out of 10.  Other  details include, she is educated through the eighth grade, formerly worked in [employer], never smoker, chronically chews tobacco. Formerly enjoyed working in her garden however she had to stop due to her chronic back pain.  ED Course:  Vital signs remarkable for heart rate ranging from 46-105, systolic blood pressure remained over 130 mm Hg, respiratory rate and oxygen saturation normal.  Diagnostics remarkable for pain of 1.37, which is elevated from baseline. Normal lactic acid. CBC unremarkable.  Chest x-ray without acute findings. Radiograph cervical spine without acute findings.CT scan of the head without contrast revealed mild atrophic changes without acute normality. Emergency medicine team's concern for CVA given that we may attempt to send the patient up she was unsteady on her feet, required much support. This accommodation confusion MRI was ordered. Hospital medicine consult further management.  Ammonia 53  Review of Systems: A complete ROS was obtained; pertinent positives negatives are denoted in the HPI. Otherwise, all systems are negative.   Past Medical History:  Diagnosis Date  . Hypertension    Social History   Socioeconomic History  . Marital status: Widowed    Spouse name: Not on file  . Number of children: Not on file  . Years of education: Not on file  . Highest education level: Not on file  Occupational History  . Not on file  Social Needs  . Financial resource strain: Not on file  . Food insecurity:    Worry: Not on file    Inability: Not on file  . Transportation needs:    Medical: Not on file    Non-medical: Not on file  Tobacco Use  . Smoking status:  Never Smoker  . Smokeless tobacco: Never Used  Substance and Sexual Activity  . Alcohol use: No  . Drug use: No  . Sexual activity: Never  Lifestyle  . Physical activity:    Days per week: Not on file    Minutes per session: Not on file  . Stress: Not on file  Relationships  . Social  connections:    Talks on phone: Not on file    Gets together: Not on file    Attends religious service: Not on file    Active member of club or organization: Not on file    Attends meetings of clubs or organizations: Not on file    Relationship status: Not on file  . Intimate partner violence:    Fear of current or ex partner: Not on file    Emotionally abused: Not on file    Physically abused: Not on file    Forced sexual activity: Not on file  Other Topics Concern  . Not on file  Social History Narrative  . Not on file   Family hx: Daughter living, obese.  Physical Exam: Vitals:   01/17/18 1430 01/17/18 1559 01/17/18 1830 01/17/18 2000  BP: 130/67 (!) 144/70 (!) 171/71 (!) 149/59  Pulse: 94 (!) 46 96   Resp: 16 14 17    Temp:      TempSrc:  Oral    SpO2: 100% 98% 100% 97%   General: Elderly black woman. Appears calm and comfortable. ENT: Grossly normal hearing, MMM, mouth with poor dentition, chewing tobacco - copious amounts present. Cardiovascular: RRR. 2/6 systolic murmur. No LE edema.  Respiratory: CTA bilaterally. No wheezes or crackles. Normal respiratory effort. Abdomen: Soft, mild generalized tenderness to palpation, ne rebound or guarding. Skin: No rash or induration seen on limited exam. Dry. Musculoskeletal: Grossly normal tone BUE/BLE. Appropriate ROM.  Psychiatric: Grossly normal mood and affect. Neurologic: Moves all extremities in coordinated fashion. Not oriented to year or specific historical details. Finger to nose WNL bilaterally. Is delayed in following commands. No rigidity in upper extremities. Gross sensation intact x 4 extremities. Proximal upper and lower extremity strength preserved.  Pupils b/l round and equally reactive. Heel to shin slow but without frank abnormality bilaterally. Visual fields intact to confrontation.  No frank CN deficits appreciated.   Deferred ambulation given recent attempt to ambulate with emergency medicine team resulted in  immediate unsteadiness requiring much support.  I have personally reviewed the following labs, culture data, and imaging studies.  Assessment/Plan:  #Confusion  #Impaired ambulation #Concern for CVA Course: presenting symptoms of 2 days of confusion and unsteadiness on feet.  CT head without head bleed or acute abnormalities. Assessment: differential remains possible posterior circulation CVA, other potential contributors include medication induced (on opioids at home for chronic back pain), AKI (Cr > 1 in this elderly woman), vs other (micronutrient deficiency, underlying chronic illness, metabolic, subclinical infection); if this is a CVA Plan: -will continue with CVA work up including MR brain imaging, TTE, telemetry, neck vessel imaging -limit sedating medications; checking TSH, RPR, vit B12, HIV, HCV to evaluate other causes -uncertain of what to make of mildly elevated ammonia level, consider liver US if clinical picture does not become more clear with aforementioned work up  #Other problems -AKI, likely: on admission, Cr of 1.37, UA with mildly increased spec grav; will hydrate with isotonic fluids (100 cc x 10 hrs) and re-assess response -Hypertension: ARB listed on home medication, hold and monitor for now -Chronic abdominal pain:  uncertain cause, start PPI, hold NSAIDs; procure troponin -Chronic back pain: limit sedating medications, acetaminophen 1 g PO tid  DVT prophylaxis: Perry hep Code Status: full code after discussion on admission Disposition Plan: possible DC within 24-36 hrs pending clinical course Consults called: none, consider PT / OT  Admission status: admit to hospital medicine, telemetry level of care   Laurell RoofPatrick Aithan Farrelly, MD [facility] [phone]  If 7PM-7AM, please contact night-coverage [email or web address] Password [email or web address]

## 2018-01-17 NOTE — ED Provider Notes (Signed)
MSE was initiated and I personally evaluated the patient and placed orders (if any) at  2:23 PM on January 17, 2018.  The patient appears stable so that the remainder of the MSE may be completed by another provider.  Blood pressure 131/85, pulse (!) 105, temperature 98.4 F (36.9 C), temperature source Oral, resp. rate 20, SpO2 99 %.  Samantha Gonzales is a 77 y.o. female companied by her daughter with whom she lives they are complaining of alteration in her balance and mental status onset 2 days ago she also has voice difference.  She is been in her normal state of health, she is reporting a fall several days ago that the daughter did not know about they also reporting that she has been noncompliant with her medications which they were unaware of, no fever, chills, nausea or vomiting, no known substance abuse.  Patient is reporting abdominal pain (daughter states that this is chronic) and back pain.    Kaylyn Limisciotta, Claudy Abdallah, PA-C 01/17/18 1434    Terrilee FilesButler, Michael C, MD 01/18/18 [phone]

## 2018-01-17 NOTE — ED Notes (Signed)
Attempted to ambulate pt to bathroom.  Pt very unsteady on her feet.  Opted for bedside commode.

## 2018-01-17 NOTE — ED Triage Notes (Signed)
Daughter stated, she's been not been able to walk or walking but stumbling and needs assistance. she's getting her days mixed up and repeating the same questions. This started a couple of days ago.

## 2018-01-17 NOTE — ED Notes (Addendum)
Pt requesting pain meds for chronic back pain.  PA notified.  PA also stated he walked in to the room and the pt was eating a large meal that had been brought in by family, though family had specifically been told not to feed pt.  PA stated pt was tolerating swallowing well.

## 2018-01-17 NOTE — ED Notes (Signed)
Asked pt for urine specimen. Pt unable to urinate @ this time.

## 2018-01-18 ENCOUNTER — Observation Stay (HOSPITAL_COMMUNITY): Payer: Medicare Other

## 2018-01-18 ENCOUNTER — Other Ambulatory Visit: Payer: Self-pay

## 2018-01-18 ENCOUNTER — Inpatient Hospital Stay (HOSPITAL_COMMUNITY): Payer: Medicare Other

## 2018-01-18 DIAGNOSIS — Y92009 Unspecified place in unspecified non-institutional (private) residence as the place of occurrence of the external cause: Secondary | ICD-10-CM | POA: Diagnosis not present

## 2018-01-18 DIAGNOSIS — F17220 Nicotine dependence, chewing tobacco, uncomplicated: Secondary | ICD-10-CM | POA: Diagnosis present

## 2018-01-18 DIAGNOSIS — R262 Difficulty in walking, not elsewhere classified: Secondary | ICD-10-CM | POA: Diagnosis present

## 2018-01-18 DIAGNOSIS — Z79891 Long term (current) use of opiate analgesic: Secondary | ICD-10-CM | POA: Diagnosis not present

## 2018-01-18 DIAGNOSIS — E519 Thiamine deficiency, unspecified: Secondary | ICD-10-CM | POA: Diagnosis present

## 2018-01-18 DIAGNOSIS — N179 Acute kidney failure, unspecified: Secondary | ICD-10-CM | POA: Diagnosis present

## 2018-01-18 DIAGNOSIS — R270 Ataxia, unspecified: Secondary | ICD-10-CM | POA: Diagnosis present

## 2018-01-18 DIAGNOSIS — R269 Unspecified abnormalities of gait and mobility: Secondary | ICD-10-CM | POA: Diagnosis not present

## 2018-01-18 DIAGNOSIS — R634 Abnormal weight loss: Secondary | ICD-10-CM | POA: Diagnosis present

## 2018-01-18 DIAGNOSIS — R4182 Altered mental status, unspecified: Secondary | ICD-10-CM | POA: Diagnosis present

## 2018-01-18 DIAGNOSIS — M549 Dorsalgia, unspecified: Secondary | ICD-10-CM | POA: Diagnosis present

## 2018-01-18 DIAGNOSIS — G8929 Other chronic pain: Secondary | ICD-10-CM | POA: Diagnosis present

## 2018-01-18 DIAGNOSIS — I6789 Other cerebrovascular disease: Secondary | ICD-10-CM

## 2018-01-18 DIAGNOSIS — E538 Deficiency of other specified B group vitamins: Secondary | ICD-10-CM | POA: Diagnosis present

## 2018-01-18 DIAGNOSIS — R109 Unspecified abdominal pain: Secondary | ICD-10-CM

## 2018-01-18 DIAGNOSIS — D519 Vitamin B12 deficiency anemia, unspecified: Secondary | ICD-10-CM | POA: Diagnosis not present

## 2018-01-18 DIAGNOSIS — I10 Essential (primary) hypertension: Secondary | ICD-10-CM | POA: Diagnosis present

## 2018-01-18 DIAGNOSIS — Z79899 Other long term (current) drug therapy: Secondary | ICD-10-CM | POA: Diagnosis not present

## 2018-01-18 DIAGNOSIS — T50905A Adverse effect of unspecified drugs, medicaments and biological substances, initial encounter: Secondary | ICD-10-CM | POA: Diagnosis present

## 2018-01-18 DIAGNOSIS — R7989 Other specified abnormal findings of blood chemistry: Secondary | ICD-10-CM | POA: Diagnosis not present

## 2018-01-18 DIAGNOSIS — Z880 Allergy status to penicillin: Secondary | ICD-10-CM | POA: Diagnosis not present

## 2018-01-18 DIAGNOSIS — R410 Disorientation, unspecified: Secondary | ICD-10-CM | POA: Diagnosis not present

## 2018-01-18 LAB — ECHOCARDIOGRAM COMPLETE
HEIGHTINCHES: 71 in
Weight: 2652.57 oz

## 2018-01-18 LAB — LIPID PANEL
CHOLESTEROL: 290 mg/dL — AB (ref 0–200)
HDL: 50 mg/dL (ref >40)
LDL Cholesterol: 221 mg/dL — ABNORMAL HIGH (ref 0–99)
TRIGLYCERIDES: 94 mg/dL (ref <150)
Total CHOL/HDL Ratio: 5.8 RATIO
VLDL: 19 mg/dL (ref 0–40)

## 2018-01-18 LAB — LIPASE, BLOOD: Lipase: 22 U/L (ref 11–51)

## 2018-01-18 LAB — VITAMIN B12: VITAMIN B 12: 140 pg/mL — AB (ref 180–914)

## 2018-01-18 LAB — RPR: RPR: NONREACTIVE

## 2018-01-18 LAB — HEMOGLOBIN A1C
HEMOGLOBIN A1C: 5.1 % (ref 4.8–5.6)
MEAN PLASMA GLUCOSE: 99.67 mg/dL

## 2018-01-18 LAB — URINE CULTURE: CULTURE: NO GROWTH

## 2018-01-18 LAB — TSH: TSH: 1.191 u[IU]/mL (ref 0.350–4.500)

## 2018-01-18 LAB — HIV ANTIBODY (ROUTINE TESTING W REFLEX): HIV SCREEN 4TH GENERATION: NONREACTIVE

## 2018-01-18 MED ORDER — CYANOCOBALAMIN 1000 MCG/ML IJ SOLN
1000.0000 ug | Freq: Once | INTRAMUSCULAR | Status: AC
  Administered 2018-01-18: 1000 ug via INTRAMUSCULAR
  Filled 2018-01-18: qty 1

## 2018-01-18 MED ORDER — GI COCKTAIL ~~LOC~~
30.0000 mL | Freq: Once | ORAL | Status: AC
  Administered 2018-01-18: 30 mL via ORAL
  Filled 2018-01-18 (×3): qty 30

## 2018-01-18 MED ORDER — HYDROCODONE-ACETAMINOPHEN 5-325 MG PO TABS
2.0000 | ORAL_TABLET | Freq: Once | ORAL | Status: AC
  Administered 2018-01-18: 2 via ORAL
  Filled 2018-01-18: qty 2

## 2018-01-18 MED ORDER — ONDANSETRON HCL 4 MG/2ML IJ SOLN: 4.0000 mg | Freq: Four times a day (QID) | INTRAMUSCULAR | Status: DC | PRN

## 2018-01-18 MED ORDER — SENNOSIDES-DOCUSATE SODIUM 8.6-50 MG PO TABS
1.0000 | ORAL_TABLET | Freq: Two times a day (BID) | ORAL | Status: DC
  Administered 2018-01-18: 1 via ORAL
  Filled 2018-01-18: qty 1

## 2018-01-18 NOTE — Progress Notes (Signed)
Rehab Admissions Coordinator Note:  Patient was screened by Clois DupesBoyette, Alysia Scism Godwin for appropriateness for an Inpatient Acute Rehab Consult per PT and OT recommendations, BUT, no definitive diagnosis at this time. I will follow. Can't plan consult without diagnosis to justify admit at this time.  Clois Dupes.  Kaislyn Gulas Godwin 01/18/2018, 2:06 PM  I can be reached at [phone].

## 2018-01-18 NOTE — Evaluation (Signed)
Clinical/Bedside Swallow Evaluation Patient Details  Name: Samantha Gonzales MRN: [id number] Date of Birth: 01-Dec-1940  Today's Date: 01/18/2018 Time: SLP Start Time (ACUTE ONLY): 0915 SLP Stop Time (ACUTE ONLY): 0929 SLP Time Calculation (min) (ACUTE ONLY): 14 min  Past Medical History:  Past Medical History:  Diagnosis Date  . Hypertension    Past Surgical History: History reviewed. No pertinent surgical history. HPI:  Patient presents to the emergency department with unsteady gait and altered mental status. The daughter gives the history that the patient seems confused and she is not able to walk over the last 2 days. Patient states that she is only having back pain at this time. Patient's CT and MRI were negative for stroke.    Assessment / Plan / Recommendation Clinical Impression  Patient has no history of swallowing problems. She was admitted due to confusion and possible stroke. Her CT and MRI are negative for stroke. She tolerated all consistencies well with no overt s/s of aspiration, with the exception of a consistent late throat clear with ice chips and ice water. She had a clear vocal quality. Coffee was given and patient had no throat clear after multiple attempts. Daughter was present at bedside and confirmed that her mother only drinks room temperature and warm drinks due to poor dentition and pain with cold liquids. Suspect the throat clear may be due to poor bolus control of very cold liquids. Recommend regular diet with thin liquids with no ice and speech therapy to follow up for diet tolerance. Communicated diet recommendation to nurse.  SLP Visit Diagnosis: Dysphagia, oropharyngeal phase (R13.12)    Aspiration Risk  Mild aspiration risk    Diet Recommendation Regular;Thin liquid   Liquid Administration via: Cup;Straw Medication Administration: Whole meds with liquid Supervision: Patient able to self feed Postural Changes: Seated upright at 90 degrees    Other   Recommendations Oral Care Recommendations: Oral care QID   Follow up Recommendations        Frequency and Duration min 1 x/week  1 week       Prognosis Prognosis for Safe Diet Advancement: Good Barriers to Reach Goals: Cognitive deficits      Swallow Study   General Date of Onset: 07/20/18 HPI: Patient presents to the emergency department with unsteady gait and altered mental status. The daughter gives the history that the patient seems confused and she is not able to walk over the last 2 days. Patient states that she is only having back pain at this time. Patient's CT and MRI were negative for stroke.  Type of Study: Bedside Swallow Evaluation Previous Swallow Assessment: no Diet Prior to this Study: NPO Temperature Spikes Noted: No Respiratory Status: Room air History of Recent Intubation: No Behavior/Cognition: Alert;Cooperative;Pleasant mood Oral Cavity Assessment: Within Functional Limits Oral Care Completed by SLP: No Oral Cavity - Dentition: Poor condition;Missing dentition Vision: Functional for self-feeding Self-Feeding Abilities: Able to feed self Patient Positioning: Upright in bed Baseline Vocal Quality: Normal Volitional Cough: Strong Volitional Swallow: Able to elicit    Oral/Motor/Sensory Function Overall Oral Motor/Sensory Function: Within functional limits   Ice Chips Ice chips: Impaired Presentation: Spoon Oral Phase Impairments: Reduced lingual movement/coordination Other Comments: Patient had a clear vocal quality after swallow but did have a delayed throat clear with ice chips and thin liquid.   Thin Liquid Thin Liquid: Impaired Presentation: Cup;Self Fed Oral Phase Impairments: Poor awareness of bolus Other Comments: Patient had a clear vocal quality after swallow but did have a delayed  throat clear with ice chips and thin liquid.    Nectar Thick Nectar Thick Liquid: Not tested   Honey Thick Honey Thick Liquid: Not tested   Puree Puree: Within  functional limits Presentation: Self Fed   Solid   GO   Solid: Within functional limits Presentation: Self Fed       Lindalou HoseSarah J. Tisheena Maguire, MA, CCC-SLP 01/18/2018 9:57 AM

## 2018-01-18 NOTE — Progress Notes (Signed)
Patient c/o of pain 7-10 in abd. Patient has no PRN ordered , MD(Vann) called. GI cocktail ordered, pharmacy notified and staff awaiting medication.

## 2018-01-18 NOTE — Progress Notes (Signed)
*  PRELIMINARY RESULTS* Echocardiogram 2D Echocardiogram has been performed.  Jeryl Columbialliott, Philana Younis 01/18/2018, 2:49 PM

## 2018-01-18 NOTE — Evaluation (Signed)
Occupational Therapy Evaluation Patient Details Name: Samantha Gonzales MRN: [id number] DOB: 03/04/1941 Today's Date: 01/18/2018    History of Present Illness 77 year old woman with medical problems including chronic back pain on opioid therapy, chronic abdominal pain, hypertension, and longstanding chewing tobacco user. Patient presents to the emergency department with unsteady gait and altered mental status. The daughter gives the history that the patient seems confused and she is not able to walk over the last 2 days. CT and MRI were negative for stroke.    Clinical Impression   PTA, pt was living with her daughter and was independent with ADLs and IADLs. Pt currently requiring Min A for ADLs in standing, Min A for UB ADLs, and Mod A for LB ADLs. Pt presenting with decreased balance and required physical A to prevent LOB. Currently, pt is at high risk to fall.  Daughter reporting that current cognition not baseline and pt presenting with decreased ST memory, processing, awareness, and attention. Pt will require further acute OT to facilitate safe dc. Recommend dc to CIR for further intensive OT to optimize safety, independence with ADLs, and return to PLOF.      Follow Up Recommendations  CIR;Supervision/Assistance - 24 hour    Equipment Recommendations  Other (comment);3 in 1 bedside commode(Defer to next venue)    Recommendations for Other Services Rehab consult;PT consult;Speech consult     Precautions / Restrictions Precautions Precautions: Fall Restrictions Weight Bearing Restrictions: No      Mobility Bed Mobility Overal bed mobility: Needs Assistance Bed Mobility: Supine to Sit;Sit to Supine     Supine to sit: Supervision;HOB elevated Sit to supine: Supervision   General bed mobility comments: supervision for safety  Transfers Overall transfer level: Needs assistance   Transfers: Sit to/from Stand Sit to Stand: Min assist         General transfer comment:  Min A for standing balance. Pt with anterior lean in standing and required tactile and verabl cues to correct and prevent LOB    Balance Overall balance assessment: Needs assistance Sitting-balance support: No upper extremity supported;Feet supported Sitting balance-Leahy Scale: Fair Sitting balance - Comments: Able to reach forward and adjust socks   Standing balance support: Bilateral upper extremity supported;During functional activity Standing balance-Leahy Scale: Poor Standing balance comment: Reliant on UE support and physical A                           ADL either performed or assessed with clinical judgement   ADL Overall ADL's : Needs assistance/impaired Eating/Feeding: Set up;Sitting   Grooming: Oral care;Minimal assistance;Standing Grooming Details (indicate cue type and reason): Pt requiring Min A for standing balance due to left lateral lean. Pt with three LOB stadning at sink and required physical A to correct Upper Body Bathing: Minimal assistance;Sitting   Lower Body Bathing: Moderate assistance;Sit to/from stand   Upper Body Dressing : Minimal assistance;Sitting   Lower Body Dressing: Moderate assistance;Sit to/from stand Lower Body Dressing Details (indicate cue type and reason): Pt able to adjust socks at EOB. Mod A for standign balance during LB dressing Toilet Transfer: Minimal assistance;Ambulation(Simulated in room)           Functional mobility during ADLs: Minimal assistance General ADL Comments: Pt presenting with poor fucntional performance and demonstrated decreased cognition, balance, and safety awareness.      Vision Baseline Vision/History: Wears glasses Wears Glasses: (As needed) Patient Visual Report: No change from baseline  Perception     Praxis      Pertinent Vitals/Pain Pain Assessment: 0-10 Pain Score: 8  Pain Location: Stomach Pain Descriptors / Indicators: Discomfort;Grimacing Pain Intervention(s): Monitored  during session;Repositioned;Patient requesting pain meds-RN notified     Hand Dominance Right   Extremity/Trunk Assessment Upper Extremity Assessment Upper Extremity Assessment: Overall WFL for tasks assessed   Lower Extremity Assessment Lower Extremity Assessment: Defer to PT evaluation   Cervical / Trunk Assessment Cervical / Trunk Assessment: Normal   Communication Communication Communication: Other (comment)(Slow response)   Cognition Arousal/Alertness: Awake/alert Behavior During Therapy: WFL for tasks assessed/performed Overall Cognitive Status: Impaired/Different from baseline Area of Impairment: Attention;Memory;Following commands;Safety/judgement;Awareness;Problem solving                   Current Attention Level: Sustained Memory: Decreased short-term memory Following Commands: Follows one step commands inconsistently;Follows one step commands with increased time Safety/Judgement: Decreased awareness of safety;Decreased awareness of deficits Awareness: Emergent Problem Solving: Slow processing;Difficulty sequencing;Requires verbal cues;Requires tactile cues General Comments: Daughter reporting this is not baseline cognition. Pt joking throughout session and turning to her daughter to answer questions. Pt oriented to time, self, and place. Pt presenting with poor problem solving, attention, and awarness. Pt not recalling recent fall reported by daughter and states "when did I fall! I dont remember that."    General Comments  Daughter present throughout session    Exercises     Shoulder Instructions      Home Living Family/patient expects to be discharged to:: Private residence Living Arrangements: Children Available Help at Discharge: Family Type of Home: House Home Access: Stairs to enter Secretary/administrator of Steps: 3 Entrance Stairs-Rails: Can reach both Home Layout: One level     Bathroom Shower/Tub: Therapist, sports: Standard     Home Equipment: None          Prior Functioning/Environment Level of Independence: Independent        Comments: ADLs, IADLs, driving.         OT Problem List: Decreased strength;Decreased range of motion;Decreased activity tolerance;Impaired balance (sitting and/or standing);Decreased cognition;Decreased safety awareness;Decreased knowledge of use of DME or AE;Decreased knowledge of precautions;Pain      OT Treatment/Interventions: Self-care/ADL training;Therapeutic exercise;Energy conservation;DME and/or AE instruction;Therapeutic activities;Patient/family education    OT Goals(Current goals can be found in the care plan section) Acute Rehab OT Goals Patient Stated Goal: Go home OT Goal Formulation: With patient/family Time For Goal Achievement: 02/01/18 Potential to Achieve Goals: Good ADL Goals Pt Will Perform Grooming: with set-up;with supervision;standing Pt Will Perform Upper Body Dressing: with set-up;with supervision;sitting Pt Will Perform Lower Body Dressing: with set-up;with supervision;sit to/from stand Pt Will Transfer to Toilet: with set-up;with supervision;ambulating;bedside commode Pt Will Perform Toileting - Clothing Manipulation and hygiene: with set-up;with supervision;sit to/from stand Pt Will Perform Tub/Shower Transfer: Tub transfer;with min guard assist;ambulating;3 in 1 Additional ADL Goal #1: Pt will performing ADLs in distracting environment with 1-2 verbal cues  OT Frequency: Min 2X/week   Barriers to D/C:            Co-evaluation              AM-PAC PT "6 Clicks" Daily Activity     Outcome Measure Help from another person eating meals?: None Help from another person taking care of personal grooming?: A Little Help from another person toileting, which includes using toliet, bedpan, or urinal?: A Lot Help from another person bathing (including washing, rinsing, drying)?: A Lot Help  from another person to put on and  taking off regular upper body clothing?: A Little Help from another person to put on and taking off regular lower body clothing?: A Lot 6 Click Score: 16   End of Session Equipment Utilized During Treatment: Gait belt Nurse Communication: Mobility status  Activity Tolerance: Patient tolerated treatment well Patient left: in bed;with call bell/phone within reach;with bed alarm set;with family/visitor present  OT Visit Diagnosis: Unsteadiness on feet (R26.81);Other abnormalities of gait and mobility (R26.89);Muscle weakness (generalized) (M62.81);History of falling (Z91.81);Other symptoms and signs involving cognitive function;Pain Pain - part of body: (Stomach)                Time: 4098-1191 OT Time Calculation (min): 20 min Charges:  OT General Charges $OT Visit: 1 Visit OT Evaluation $OT Eval Moderate Complexity: 1 Mod G-Codes:     Mekaila Tarnow MSOT, OTR/L Acute Rehab Pager: [phone] Office: [phone]  Theodoro Grist Jaymien Landin 01/18/2018, 12:08 PM

## 2018-01-18 NOTE — Evaluation (Signed)
Physical Therapy Evaluation Patient Details Name: Samantha Gonzales MRN: [id number] DOB: June 27, 1941 Today's Date: 01/18/2018   History of Present Illness  77 year old woman with medical problems including chronic back pain on opioid therapy, chronic abdominal pain, hypertension, and longstanding chewing tobacco user. Patient presents to the emergency department with unsteady gait and altered mental status. The daughter gives the history that the patient seems confused and she is not able to walk over the last 2 days. CT and MRI were negative for stroke.   Clinical Impression  Pt with impaired cognition and is at high falls risk. Pt was indep PTA now requires modA for safe OOB mobility and demo's decreased insight to safety and deficits. Acute PT to con't to follow. Recommend CIR upon d/c for maximal functional recovery.    Follow Up Recommendations CIR    Equipment Recommendations  (TBD)    Recommendations for Other Services Rehab consult     Precautions / Restrictions Precautions Precautions: Fall Restrictions Weight Bearing Restrictions: No      Mobility  Bed Mobility Overal bed mobility: Needs Assistance Bed Mobility: Supine to Sit     Supine to sit: Supervision;HOB elevated Sit to supine: Supervision   General bed mobility comments: supervision for safety, increased time  Transfers Overall transfer level: Needs assistance Equipment used: 1 person hand held assist Transfers: Sit to/from Stand Sit to Stand: Min assist         General transfer comment: pt adamant about trying to stand up on own however very unsteady requiring minA to prevent fall  Ambulation/Gait Ambulation/Gait assistance: Min assist;Mod assist Ambulation Distance (Feet): 120 Feet Assistive device: 1 person hand held assist(however pt refused to hold onto PT) Gait Pattern/deviations: Step-through pattern;Decreased stride length;Scissoring;Staggering right;Staggering left Gait velocity:  slow Gait velocity interpretation: Below normal speed for age/gender General Gait Details: pt very unsteady with frequent cross over gait patterns to the R and to the L. pt stating "ooo i'm so sorry for leaning into you like that." but then seconds later stating"let me go by myself"  Pt with difficulty maintaining upright posture as well/trunk flexed  Stairs            Wheelchair Mobility    Modified Rankin (Stroke Patients Only)       Balance Overall balance assessment: Needs assistance Sitting-balance support: No upper extremity supported;Feet supported Sitting balance-Leahy Scale: Fair Sitting balance - Comments: Able to reach forward and adjust socks   Standing balance support: Bilateral upper extremity supported;During functional activity Standing balance-Leahy Scale: Poor Standing balance comment: Reliant on UE support and physical A                             Pertinent Vitals/Pain Pain Assessment: Faces Faces Pain Scale: No hurt    Home Living Family/patient expects to be discharged to:: Private residence Living Arrangements: Children Available Help at Discharge: Family;Available PRN/intermittently(both dtr and granddtr work) Type of Home: House Home Access: Stairs to enter Entrance Stairs-Rails: Can reach both Secretary/administrator of Steps: 3 Home Layout: One level Home Equipment: None      Prior Function Level of Independence: Independent         Comments: ADLs, IADLs, driving.      Hand Dominance   Dominant Hand: Right    Extremity/Trunk Assessment   Upper Extremity Assessment Upper Extremity Assessment: Generalized weakness    Lower Extremity Assessment Lower Extremity Assessment: Generalized weakness    Cervical / Trunk  Assessment Cervical / Trunk Assessment: Normal  Communication   Communication: Other (comment)(Slow response)  Cognition Arousal/Alertness: Awake/alert Behavior During Therapy: WFL for tasks  assessed/performed Overall Cognitive Status: Impaired/Different from baseline Area of Impairment: Attention;Memory;Following commands;Safety/judgement;Awareness;Problem solving                   Current Attention Level: Sustained Memory: Decreased short-term memory Following Commands: Follows one step commands inconsistently;Follows one step commands with increased time Safety/Judgement: Decreased awareness of safety;Decreased awareness of deficits Awareness: Emergent Problem Solving: Slow processing;Difficulty sequencing;Requires verbal cues;Requires tactile cues        General Comments General comments (skin integrity, edema, etc.): suspect pt has chewing tobacco in room, notified RN    Exercises     Assessment/Plan    PT Assessment Patient needs continued PT services  PT Problem List Decreased strength;Decreased activity tolerance;Decreased balance;Decreased mobility;Decreased coordination;Decreased cognition;Decreased knowledge of use of DME;Decreased safety awareness       PT Treatment Interventions DME instruction;Gait training;Stair training;Functional mobility training;Therapeutic activities;Therapeutic exercise;Balance training;Neuromuscular re-education    PT Goals (Current goals can be found in the Care Plan section)  Acute Rehab PT Goals Patient Stated Goal: Go home PT Goal Formulation: With patient/family Time For Goal Achievement: 01/25/18 Potential to Achieve Goals: Good    Frequency Min 4X/week   Barriers to discharge Decreased caregiver support family works    Co-evaluation               AM-PAC PT "6 Clicks" Daily Activity  Outcome Measure Difficulty turning over in bed (including adjusting bedclothes, sheets and blankets)?: A Little Difficulty moving from lying on back to sitting on the side of the bed? : A Little Difficulty sitting down on and standing up from a chair with arms (e.g., wheelchair, bedside commode, etc,.)?: Unable Help  needed moving to and from a bed to chair (including a wheelchair)?: A Little Help needed walking in hospital room?: A Little Help needed climbing 3-5 steps with a railing? : A Little 6 Click Score: 16    End of Session Equipment Utilized During Treatment: Gait belt Activity Tolerance: Patient tolerated treatment well Patient left: in chair;with call bell/phone within reach;with chair alarm set;with family/visitor present Nurse Communication: Mobility status(suspicion of having chewing tobacco in room) PT Visit Diagnosis: Unsteadiness on feet (R26.81)    Time: 4098-11911112-1137 PT Time Calculation (min) (ACUTE ONLY): 25 min   Charges:   PT Evaluation $PT Eval Moderate Complexity: 1 Mod PT Treatments $Gait Training: 8-22 mins   PT G Codes:        Lewis ShockAshly Areesha Dehaven, PT, DPT Pager #: [phone] Office #: [phone]   Neri Vieyra M Keyaria Lawson 01/18/2018, 1:24 PM

## 2018-01-18 NOTE — Progress Notes (Signed)
Patient received GI cocktail at later time, due to medication not available, requiring pharmacy to be contacted. Then patient off unit for procedure, ultra sound was contacted for approval to give GI cocktail so close to procedure time. US gave the okay and GI cocktail given for the management of patient symptoms.

## 2018-01-18 NOTE — Progress Notes (Signed)
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  Wealthy Danielski Eunice, RDMS, RVT   

## 2018-01-18 NOTE — Progress Notes (Signed)
PROGRESS NOTE    Samantha Gonzales  NFA:[id number] DOB: [birth year] DOA: 01/17/2018 PCP: System, Pcp Not In   Outpatient Specialists:  Outlaw (GI)   Brief Narrative:  77 year old woman with medical problems including chronic back pain on opioid therapy, chronic abdominal pain, hypertension, and longstanding chewing tobacco user presenting with 2 day history of confusion and difficulty walking.  Note history is obtained by the patient's family at bedside including her daughter and her sister, patient's own account is somewhat limited due to her cognitive state.  At baseline, the patient is independent in her ADLs and IADLs. She walks without assistance. However, 2 days ago she suffered an unwitnessed fall, she fell on her bilateral knees. Since then she's been unsteady on her feet, leaning against household objects to maintain her balance. Has had significant difficulty ambulating. Family reports that she's been somewhat confused, not oriented to year. Her conversation at times has not been appropriate, and she's been noted to be stumbling, family reports that she's also been dizzy. The patient cannot report that she takes for her medications, but does report she is followed by pain management clinic and takes a medication prescribed to 3 times daily. She denies any recent increase in her pain medications that are prescribed however, she does report chronic abdominal pain that she takes frequent over-the-counter medications including NSAIDs. She does not drink alcohol.  Family at bedside to report that she's been evaluated with an EGD as well as colonoscopy, this was over 1 year ago and did not reveal any acute abnormalities. The patient denies any fevers, chills, chest pain, shortness of breath, nausea, vomiting. Her bowel pain is poorly localized, she has bowel movements to 3 times weekly, describes soft.She takes a laxative as needed. She reports back pain as thoracic in location, rates a 5 out of  10.      Assessment & Plan:   Active Problems:   Altered mental state   Confusion with Impaired ambulation -CT head and MRI negative -B12 low-- replace IM -carotids/echo pending -? Ammonia significance -bowel regimen -limit sedating meds -not taking in much PO and not back at baseline yet per family -no sign of infection -may need thoracic and lumbar imaging of spine  AKI -IVF -recheck in AM  Hypertension:  -hold nephrotoxic meds  Chronic abdominal pain:  -check RUQ u/s -had EGD in last year per family-- no records in Epic-- takes NSAIDS -mildly elevated ammonia level -limit sedating meds      DVT prophylaxis:  SQ Heparin  Code Status: Full Code   Family Communication: At bedside  Disposition Plan:     Consultants:    Subjective: C/o pain in abdomen--not eating much Mental status and speech not at baseline either-- slowed  Objective: Vitals:   01/17/18 2000 01/17/18 2048 01/18/18 0424 01/18/18 1152  BP: (!) 149/59 (!) 127/114 (!) 174/54 (!) 148/59  Pulse:  81 77 92  Resp:  16 18 18   Temp:  98.7 F (37.1 C) 98.4 F (36.9 C) 99.1 F (37.3 C)  TempSrc:  Oral Oral Oral  SpO2: 97% 100% 100% 100%  Weight:  75.2 kg (165 lb 12.6 oz)    Height:  5\' 11"  (1.803 m)      Intake/Output Summary (Last 24 hours) at 01/18/2018 1337 Last data filed at 01/18/2018 1000 Gross per 24 hour  Intake 4751.25 ml  Output -  Net 4751.25 ml   Filed Weights   01/17/18 2048  Weight: 75.2 kg (165 lb 12.6 oz)  Examination:  General exam: Appears calm and comfortable  Respiratory system: Clear to auscultation. Respiratory effort normal. Cardiovascular system: S1 & S2 heard, RRR. No JVD, murmurs, rubs, gallops or clicks. No pedal edema. Gastrointestinal system: Abdomen is nondistended, soft and nontender. No organomegaly or masses felt. Normal bowel sounds heard. Central nervous system: Alert and oriented. No focal neurological deficits. Extremities:  Symmetric 5 x 5 power. Skin: No rashes, lesions or ulcers Psychiatry: Judgement and insight appear normal. Mood & affect appropriate.     Data Reviewed: I have personally reviewed following labs and imaging studies  CBC: Recent Labs  Lab 01/17/18 1058  WBC 7.6  HGB 12.9  HCT 39.0  MCV 99.5  PLT 207   Basic Metabolic Panel: Recent Labs  Lab 01/17/18 1058  NA 139  K 3.5  CL 103  CO2 25  GLUCOSE 107*  BUN 17  CREATININE 1.37*  CALCIUM 9.3   GFR: Estimated Creatinine Clearance: 40.3 mL/min (A) (by C-G formula based on SCr of 1.37 mg/dL (H)). Liver Function Tests: Recent Labs  Lab 01/17/18 1058  AST 24  ALT 11*  ALKPHOS 71  BILITOT 0.6  PROT 6.6  ALBUMIN 3.5   No results for input(s): LIPASE, AMYLASE in the last 168 hours. Recent Labs  Lab 01/17/18 1540  AMMONIA 53*   Coagulation Profile: No results for input(s): INR, PROTIME in the last 168 hours. Cardiac Enzymes: Recent Labs  Lab 01/17/18 2130  TROPONINI <0.03   BNP (last 3 results) No results for input(s): PROBNP in the last 8760 hours. HbA1C: Recent Labs    01/18/18 0432  HGBA1C 5.1   CBG: No results for input(s): GLUCAP in the last 168 hours. Lipid Profile: Recent Labs    01/18/18 0432  CHOL 290*  HDL 50  LDLCALC 221*  TRIG 94  CHOLHDL 5.8   Thyroid Function Tests: Recent Labs    01/18/18 0432  TSH 1.191   Anemia Panel: Recent Labs    01/18/18 0432  VITAMINB12 140*   Urine analysis:    Component Value Date/Time   COLORURINE YELLOW 01/17/2018 1655   APPEARANCEUR HAZY (A) 01/17/2018 1655   LABSPEC 1.023 01/17/2018 1655   PHURINE 5.0 01/17/2018 1655   GLUCOSEU NEGATIVE 01/17/2018 1655   HGBUR NEGATIVE 01/17/2018 1655   BILIRUBINUR NEGATIVE 01/17/2018 1655   KETONESUR NEGATIVE 01/17/2018 1655   PROTEINUR 30 (A) 01/17/2018 1655   NITRITE NEGATIVE 01/17/2018 1655   LEUKOCYTESUR SMALL (A) 01/17/2018 1655     )No results found for this or any previous visit (from the  past 240 hour(s)).    Anti-infectives (From admission, onward)   None       Radiology Studies: Dg Chest 2 View  Result Date: 01/17/2018 CLINICAL DATA:  Balance problems with altered mental status 2 days. EXAM: CHEST - 2 VIEW COMPARISON:  None. FINDINGS: Lungs are adequately inflated without focal airspace consolidation or effusion. Mild cardiomegaly. Calcified plaque over the thoracic aorta. Degenerative change of the spine and shoulders. IMPRESSION: No acute cardiopulmonary disease. Mild cardiomegaly. Electronically Signed   By: Elberta Fortisaniel  Boyle M.D.   On: 01/17/2018 15:54   Dg Cervical Spine Complete  Result Date: 01/17/2018 CLINICAL DATA:  Altered mental status 2 days. EXAM: CERVICAL SPINE - COMPLETE 4+ VIEW COMPARISON:  None. FINDINGS: Vertebral body alignment and heights are normal. There is moderate spondylosis throughout the cervical spine. There is mild disc space narrowing at the C3-4, C4-5, C5-6 and C6-7 levels. Prevertebral soft tissues are within normal. There is  moderate uncovertebral joint spurring and facet arthropathy. There is suboptimal positioning for proper evaluation of the right-sided neural foramina. There is moderate narrowing of the left neural foramina at the C4-5 and C6-7 levels. Atlantoaxial articulation is within normal. Hardware is present over the mandible. Two metallic densities project over the region of the left maxilla. IMPRESSION: No acute findings. 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 Suboptimal positioning for accurate evaluation of the right neural foramina. Electronically Signed   By: Elberta Fortis M.D.   On: 01/17/2018 15:57   Ct Head Wo Contrast  Result Date: 01/17/2018 CLINICAL DATA:  Altered mental status EXAM: CT HEAD WITHOUT CONTRAST TECHNIQUE: Contiguous axial images were obtained from the base of the skull through the  vertex without intravenous contrast. COMPARISON:  None. FINDINGS: Brain: Mild atrophic changes are noted. No findings to suggest acute hemorrhage, acute infarction or mass lesion are noted. Vascular: No hyperdense vessel or unexpected calcification. Skull: Normal. Negative for fracture or focal lesion. Sinuses/Orbits: No acute finding. Other: None. IMPRESSION: Mild atrophic changes without acute abnormality. Electronically Signed   By: Alcide Clever M.D.   On: 01/17/2018 15:59   Mr Brain Wo Contrast (neuro Protocol)  Result Date: 01/17/2018 CLINICAL DATA:  Ataxia EXAM: MRI HEAD WITHOUT CONTRAST TECHNIQUE: Multiplanar, multiecho pulse sequences of the brain and surrounding structures were obtained without intravenous contrast. COMPARISON:  Head CT 01/17/2018 FINDINGS: Brain: The midline structures are normal. There is no acute infarct or acute hemorrhage. No mass lesion, hydrocephalus, dural abnormality or extra-axial collection. Multifocal white matter hyperintensity, most commonly due to chronic ischemic microangiopathy. 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 No age-advanced or lobar predominant atrophy. No chronic microhemorrhage or superficial siderosis. Vascular: Major intracranial arterial and venous sinus flow voids are preserved. Skull and upper cervical spine: Mild narrowing of the upper cervical spinal canal. Normal calvarium. Sinuses/Orbits: No fluid levels or advanced mucosal thickening. No mastoid or middle ear effusion. Normal orbits. IMPRESSION: 1. No acute abnormality. 2. Chronic microvascular ischemia. Electronically Signed   By: Deatra Robinson M.D.   On: 01/17/2018 23:30        Scheduled Meds: . acetaminophen  1,000 mg Oral Q8H  . gi cocktail  30 mL Oral Once  . heparin  5,000 Units Subcutaneous Q8H  . pantoprazole (PROTONIX) IV  40 mg Intravenous Q24H   Continuous Infusions: . sodium chloride 75 mL/hr at 01/18/18 0809     LOS: 0 days    Time  spent: 35 min    Joseph Art, DO [facility] Pager [phone]  If 7PM-7AM, please contact night-coverage [email or web address] Password [facility] 01/18/2018, 1:37 PM

## 2018-01-18 NOTE — Progress Notes (Signed)
Patient back from procedures and regular diet continued. Patient was chewing tobacco and was inform that she couldn't since this is a tobacco free establishment

## 2018-01-19 DIAGNOSIS — R7989 Other specified abnormal findings of blood chemistry: Secondary | ICD-10-CM

## 2018-01-19 LAB — BASIC METABOLIC PANEL
ANION GAP: 9 (ref 5–15)
BUN: 7 mg/dL (ref 6–20)
CHLORIDE: 109 mmol/L (ref 101–111)
CO2: 23 mmol/L (ref 22–32)
Calcium: 9 mg/dL (ref 8.9–10.3)
Creatinine, Ser: 0.84 mg/dL (ref 0.44–1.00)
GFR calc non Af Amer: >60 mL/min (ref >60)
Glucose, Bld: 88 mg/dL (ref 65–99)
Potassium: 3.3 mmol/L — ABNORMAL LOW (ref 3.5–5.1)
Sodium: 141 mmol/L (ref 135–145)

## 2018-01-19 LAB — MAGNESIUM: Magnesium: 2 mg/dL (ref 1.7–2.4)

## 2018-01-19 LAB — CBC
HCT: 37.6 % (ref 36.0–46.0)
HEMOGLOBIN: 12.2 g/dL (ref 12.0–15.0)
MCH: 31.6 pg (ref 26.0–34.0)
MCHC: 32.4 g/dL (ref 30.0–36.0)
MCV: 97.4 fL (ref 78.0–100.0)
Platelets: 164 10*3/uL (ref 150–400)
RBC: 3.86 MIL/uL — ABNORMAL LOW (ref 3.87–5.11)
RDW: 13.5 % (ref 11.5–15.5)
WBC: 5.3 10*3/uL (ref 4.0–10.5)

## 2018-01-19 LAB — AMMONIA: Ammonia: 75 umol/L — ABNORMAL HIGH (ref 9–35)

## 2018-01-19 LAB — HEPATITIS C ANTIBODY: HCV Ab: <0.1 s/co ratio (ref 0.0–0.9)

## 2018-01-19 MED ORDER — PANTOPRAZOLE SODIUM 40 MG PO TBEC
40.0000 mg | DELAYED_RELEASE_TABLET | Freq: Two times a day (BID) | ORAL | Status: DC
  Administered 2018-01-19 – 2018-01-20 (×3): 40 mg via ORAL
  Filled 2018-01-19 (×3): qty 1

## 2018-01-19 MED ORDER — AMLODIPINE BESYLATE 5 MG PO TABS
5.0000 mg | ORAL_TABLET | Freq: Every day | ORAL | Status: DC
  Administered 2018-01-19 – 2018-01-20 (×2): 5 mg via ORAL
  Filled 2018-01-19 (×2): qty 1

## 2018-01-19 MED ORDER — CYANOCOBALAMIN 1000 MCG/ML IJ SOLN
1000.0000 ug | Freq: Once | INTRAMUSCULAR | Status: AC
  Administered 2018-01-19: 1000 ug via INTRAMUSCULAR
  Filled 2018-01-19: qty 1

## 2018-01-19 MED ORDER — POTASSIUM CHLORIDE CRYS ER 20 MEQ PO TBCR
40.0000 meq | EXTENDED_RELEASE_TABLET | Freq: Once | ORAL | Status: AC
  Administered 2018-01-19: 40 meq via ORAL
  Filled 2018-01-19: qty 2

## 2018-01-19 MED ORDER — VITAMIN B-1 100 MG PO TABS
100.0000 mg | ORAL_TABLET | Freq: Every day | ORAL | Status: DC
  Administered 2018-01-19 – 2018-01-20 (×2): 100 mg via ORAL
  Filled 2018-01-19 (×2): qty 1

## 2018-01-19 MED ORDER — HYDROCODONE-ACETAMINOPHEN 5-325 MG PO TABS
1.0000 | ORAL_TABLET | Freq: Four times a day (QID) | ORAL | Status: DC | PRN
  Administered 2018-01-19 – 2018-01-20 (×2): 1 via ORAL
  Filled 2018-01-19 (×2): qty 1

## 2018-01-19 MED ORDER — LACTULOSE 10 GM/15ML PO SOLN
20.0000 g | Freq: Two times a day (BID) | ORAL | Status: DC
  Administered 2018-01-19 – 2018-01-20 (×3): 20 g via ORAL
  Filled 2018-01-19 (×3): qty 30

## 2018-01-19 NOTE — Progress Notes (Signed)
Physical Therapy Treatment Patient Details Name: Samantha Gonzales MRN: [id number] DOB: 05/16/1941 Today's Date: 01/19/2018    History of Present Illness 77 year old woman with medical problems including chronic back pain on opioid therapy, chronic abdominal pain, hypertension, and longstanding chewing tobacco user. Patient presents to the emergency department with unsteady gait and altered mental status. The daughter gives the history that the patient seems confused and she is not able to walk over the last 2 days. CT and MRI were negative for stroke.     PT Comments    Pt slightly improved from yesterday. Pt with no recall of ambulating or working with PT yesterday. Pt con't to chew tobacco in room despite education that this a tobacco free facility. Pt remains to be at high falls risk. Discussed options with daughter. If pt unable to go to CIR and family unable to provide 24/7 assist pt will need SNF.   Follow Up Recommendations  CIR     Equipment Recommendations       Recommendations for Other Services Rehab consult     Precautions / Restrictions Precautions Precautions: Fall Restrictions Weight Bearing Restrictions: No    Mobility  Bed Mobility Overal bed mobility: Needs Assistance Bed Mobility: Supine to Sit     Supine to sit: Supervision;HOB elevated Sit to supine: Supervision   General bed mobility comments: supervision for safety, increased time  Transfers Overall transfer level: Needs assistance Equipment used: 1 person hand held assist Transfers: Sit to/from Stand Sit to Stand: Min guard         General transfer comment: pt attempted to stand on own and fell forward catching self on sink with minA from PT  Ambulation/Gait Ambulation/Gait assistance: Min assist Ambulation Distance (Feet): 175 Feet Assistive device: 1 person hand held assist Gait Pattern/deviations: Step-through pattern;Decreased stride length;Scissoring;Staggering right;Staggering  left Gait velocity: slow Gait velocity interpretation: Below normal speed for age/gender General Gait Details: pt with more stability this date however con't to have cross over gait pattern requiring minA to maintain balance.   Stairs Stairs: Yes   Stair Management: One rail Right;Alternating pattern Number of Stairs: 5 General stair comments: pt unsteady requiring minA to prevent fall, mildly ataxic/impaired coordination  Wheelchair Mobility    Modified Rankin (Stroke Patients Only)       Balance Overall balance assessment: Needs assistance Sitting-balance support: No upper extremity supported;Feet supported Sitting balance-Leahy Scale: Fair Sitting balance - Comments: Able to reach forward and adjust socks   Standing balance support: During functional activity;No upper extremity supported Standing balance-Leahy Scale: Poor Standing balance comment: reaching for something to hold onto                            Cognition Arousal/Alertness: Awake/alert Behavior During Therapy: [facility] for tasks assessed/performed Overall Cognitive Status: Impaired/Different from baseline Area of Impairment: Attention;Memory;Following commands;Safety/judgement;Awareness;Problem solving                   Current Attention Level: Sustained Memory: Decreased short-term memory(doesn't remember walking or PT yesterday) Following Commands: Follows multi-step commands inconsistently Safety/Judgement: Decreased awareness of safety;Decreased awareness of deficits Awareness: Emergent Problem Solving: Slow processing;Difficulty sequencing;Requires verbal cues;Requires tactile cues General Comments: pt con't to chew tobacco despite education on this being a tobacco free facility      Exercises      General Comments General comments (skin integrity, edema, etc.): educated pt on not being able to have chewing tobacco in room  Pertinent Vitals/Pain Pain Assessment:  Faces Faces Pain Scale: No hurt    Home Living                      Prior Function            PT Goals (current goals can now be found in the care plan section) Acute Rehab PT Goals Patient Stated Goal: go home Progress towards PT goals: Progressing toward goals    Frequency    Min 4X/week      PT Plan Current plan remains appropriate    Co-evaluation              AM-PAC PT "6 Clicks" Daily Activity  Outcome Measure  Difficulty turning over in bed (including adjusting bedclothes, sheets and blankets)?: None Difficulty moving from lying on back to sitting on the side of the bed? : None Difficulty sitting down on and standing up from a chair with arms (e.g., wheelchair, bedside commode, etc,.)?: Unable Help needed moving to and from a bed to chair (including a wheelchair)?: A Little Help needed walking in hospital room?: A Little Help needed climbing 3-5 steps with a railing? : A Little 6 Click Score: 18    End of Session Equipment Utilized During Treatment: Gait belt Activity Tolerance: Patient tolerated treatment well Patient left: in bed;with call bell/phone within reach;with bed alarm set;with family/visitor present Nurse Communication: Mobility status PT Visit Diagnosis: Unsteadiness on feet (R26.81)     Time: [id number] PT Time Calculation (min) (ACUTE ONLY): 18 min  Charges:  $Gait Training: 8-22 mins                    G Codes:       Lewis ShockAshly Mosi Hannold, PT, DPT Pager #: [phone] Office #: [phone]    Kiante Ciavarella M Paizlee Kinder 01/19/2018, 11:47 AM

## 2018-01-19 NOTE — Care Management Note (Signed)
Case Management Note  Patient Details  Name: Samantha Gonzales MRN: [id number] Date of Birth: 05/19/1941  Subjective/Objective:      Pt in with altered mental state. She is from home with family.             Action/Plan: Recommendations are for CIR. Pt does not have admitting diagnosis for CIR. Per PT note family wanting SNF. CSW updated. CM following for d/c disposition.  Expected Discharge Date:                  Expected Discharge Plan:  Skilled Nursing Facility  In-House Referral:  Clinical Social Work  Discharge planning Services     Post Acute Care Choice:    Choice offered to:     DME Arranged:    DME Agency:     HH Arranged:    HH Agency:     Status of Service:  In process, will continue to follow  If discussed at Long Length of Stay Meetings, dates discussed:    Additional Comments:  Kermit BaloKelli F Wassim Kirksey, RN 01/19/2018, 2:56 PM

## 2018-01-19 NOTE — Evaluation (Signed)
Speech Language Pathology Evaluation Patient Details Name: Samantha Gonzales MRN: [id number] DOB: 04/22/1941 Today's Date: 01/19/2018 Time: [id number] SLP Time Calculation (min) (ACUTE ONLY): 21 min  Problem List:  Patient Active Problem List   Diagnosis Date Noted  . Altered mental status 01/18/2018  . Altered mental state 01/17/2018   Past Medical History:  Past Medical History:  Diagnosis Date  . Hypertension    Past Surgical History: History reviewed. No pertinent surgical history. HPI:  Patient presented 3/24 to the emergency department with unsteady gait and altered mental status. The daughter gives the history that the patient seemed confused and she is not able to walk 2 days PTA. Patient's CT and MRI were negative for stroke.   Assessment / Plan / Recommendation Clinical Impression   Pt presents with Mod cognitive communication deficits including sustained attention, memory, calculation, executive functioning (planning, thought organization), and reasoning. Deficits identified in verbal basic questioning and during functional mobility when pt ambulated to bathroom with observed unsteadiness and reduced awareness for need for safety precautions. Pt benefited from mod verbal cues to reinforce instructions, provide additional context clues, and sustain attention to task to complete assessment. Memory and attention deficits further emphasized with pt's repeated questioning about purpose of test with SLP providing significant explanations. Pt and pt's granddaughter report that pt's current cognitive ability is equivalent to baseline and decline further acute SLP services; however, recommend further assessment in next venue of care.     SLP Assessment  SLP Recommendation/Assessment: All further Speech Lanaguage Pathology  needs can be addressed in the next venue of care SLP Visit Diagnosis: Cognitive communication deficit (R41.841)    Follow Up Recommendations  Inpatient Rehab     Frequency and Duration           SLP Evaluation Cognition  Overall Cognitive Status: History of cognitive impairments - at baseline Arousal/Alertness: Awake/alert Orientation Level: Oriented to place;Oriented to situation;Oriented to person;Disoriented to time Attention: Sustained Sustained Attention: Impaired Sustained Attention Impairment: Verbal basic Memory: Impaired Memory Impairment: Storage deficit;Decreased recall of new information Awareness: Impaired Awareness Impairment: Emergent impairment Problem Solving: Impaired Problem Solving Impairment: Verbal basic;Functional basic Executive Function: Reasoning;Organizing;Self Correcting Reasoning: Impaired Reasoning Impairment: Verbal basic;Functional basic Organizing: Impaired Organizing Impairment: Verbal basic Self Correcting: Appears intact Safety/Judgment: Impaired       Comprehension  Auditory Comprehension Overall Auditory Comprehension: Appears within functional limits for tasks assessed    Expression Expression Primary Mode of Expression: Verbal Verbal Expression Overall Verbal Expression: Appears within functional limits for tasks assessed Written Expression Dominant Hand: Right Written Expression: Not tested   Oral / Motor  Motor Speech Overall Motor Speech: Appears within functional limits for tasks assessed   GO                   [place] Umaiza Matusik SLP Student Clinician  [place] Virgilene Stryker 01/19/2018, 4:16 PM

## 2018-01-19 NOTE — Consult Note (Signed)
[facility] Consult[facility]  Referring Provider: Dr. Marlin CanaryJessica Vann [facility] Primary Care Physician:  System, Pcp Not In Primary Gastroenterologist:  Dr. Charlott RakesVincent Schooler  Reason for Consultation:  confusion  HPI: Samantha HalonMaxine B Gonzales is a 77 y.o. female with chronic back pain and chronic opioid use.  Presents for confusion and difficulty walking.  Patient has couple year history constant abdominal pain, diffuse, "upset stomach,"  no alleviating/exacerbating factors.  Associated symptoms include nausea, occasional constipation.  No change in pain after bowel movement.  No blood in stool.  No loss-of-appetite, unintentional weight loss.  Endoscopy and colonoscopy February 2017 by Dr. Bosie ClosSchooler showed couple polyps but otherwise unremarkable.  Has history of abdominal pain dating back at least 15 years, and has had ongoing biliary ductal dilatation but with normal LFTs.   Past Medical History:  Diagnosis Date  . Hypertension     History reviewed. No pertinent surgical history.  Prior to Admission medications   Medication Sig Start Date End Date Taking? Authorizing Provider  HYDROcodone-acetaminophen (NORCO) 7.5-325 MG tablet Take 1 tablet by mouth 3 (three) times daily as needed for pain. 12/24/17  Yes [provider]  irbesartan (AVAPRO) 300 MG tablet Take 300 mg by mouth daily.   Yes [provider]  naproxen (NAPROSYN) 500 MG tablet Take 1 tablet (500 mg total) by mouth 2 (two) times daily. Patient not taking: Reported on 01/17/2018 02/07/14   Graylon GoodBaker, Zachary H, PA-C  traMADol (ULTRAM) 50 MG tablet Take 1 tablet (50 mg total) by mouth every 6 (six) hours as needed. Patient not taking: Reported on 01/17/2018 02/07/14   Graylon GoodBaker, Zachary H, PA-C    Current Facility-Administered Medications  Medication Dose Route Frequency Provider Last Rate Last Dose  . 0.9 %  sodium chloride infusion   Intravenous Continuous Marlin CanaryVann, Jessica U, DO 75 mL/hr at 01/19/18 0945    . amLODipine  (NORVASC) tablet 5 mg  5 mg Oral Daily Vann, Jessica U, DO      . cyanocobalamin ((VITAMIN B-12)) injection 1,000 mcg  1,000 mcg Intramuscular Once Vann, Jessica U, DO      . heparin injection 5,000 Units  5,000 Units Subcutaneous Q8H Elder LoveKuhlman, Patrick D, MD   5,000 Units at 01/19/18 0615  . lactulose (CHRONULAC) 10 GM/15ML solution 20 g  20 g Oral BID Marlin CanaryVann, Jessica U, DO   20 g at 01/19/18 0943  . ondansetron (ZOFRAN) injection 4 mg  4 mg Intravenous Q6H PRN Vann, Jessica U, DO      . pantoprazole (PROTONIX) EC tablet 40 mg  40 mg Oral BID Vann, Jessica U, DO      . thiamine (VITAMIN B-1) tablet 100 mg  100 mg Oral Daily Vann, Jessica U, DO        Allergies as of 01/17/2018 - Review Complete 01/17/2018  Allergen Reaction Noted  . Penicillins Hives, Itching, and Rash 02/07/2014    No family history on file.  Social History   Socioeconomic History  . Marital status: Widowed    Spouse name: Not on file  . Number of children: Not on file  . Years of education: Not on file  . Highest education level: Not on file  Occupational History  . Not on file  Social Needs  . Financial resource strain: Not on file  . Food insecurity:    Worry: Not on file    Inability: Not on file  . Transportation needs:    Medical: Not on file    Non-medical: Not on  file  Tobacco Use  . Smoking status: Never Smoker  . Smokeless tobacco: Never Used  Substance and Sexual Activity  . Alcohol use: No  . Drug use: No  . Sexual activity: Never  Lifestyle  . Physical activity:    Days per week: Not on file    Minutes per session: Not on file  . Stress: Not on file  Relationships  . Social connections:    Talks on phone: Not on file    Gets together: Not on file    Attends religious service: Not on file    Active member of club or organization: Not on file    Attends meetings of clubs or organizations: Not on file    Relationship status: Not on file  . Intimate partner violence:    Fear of current or  ex partner: Not on file    Emotionally abused: Not on file    Physically abused: Not on file    Forced sexual activity: Not on file  Other Topics Concern  . Not on file  Social History Narrative  . Not on file    Review of Systems: As per HPI, all others negative.  Physical Exam: Vital signs in last 24 hours: Temp:  [97.7 F (36.5 C)-99.2 F (37.3 C)] 98.5 F (36.9 C) (03/26 1258) Pulse Rate:  [42-89] 74 (03/26 1258) Resp:  [18-20] 18 (03/26 1258) BP: (160-208)/(65-175) 165/70 (03/26 1258) SpO2:  [97 %-100 %] 99 % (03/26 1258)   General:   Alert,  Well-developed, well-nourished, pleasant and cooperative in NAD Head:  Normocephalic and atraumatic. Eyes:  Sclera clear, no icterus.   Conjunctiva pink. Ears:  Normal auditory acuity. Nose:  No deformity, discharge,  or lesions. Mouth:  No deformity or lesions.  Oropharynx pink & moist. Neck:  Supple; no masses or thyromegaly. Lungs:  Clear throughout to auscultation.   No wheezes, crackles, or rhonchi. No acute distress. Heart:  Regular rate and rhythm; no murmurs, clicks, rubs,  or gallops. Abdomen:  Soft, generalized abdominal tenderness without peritonitis, and nondistended. No masses, hepatosplenomegaly or hernias noted. Normal bowel sounds, without guarding, and without rebound.     Msk:  Symmetrical without gross deformities. Normal posture. Pulses:  Normal pulses noted. Extremities:  Without clubbing or edema. Neurologic:  Alert and  oriented x4, a bit bradykinetic and somnolent, but does not appear confused to me right now.;  grossly normal neurologically. Skin:  Intact without significant lesions or rashes. Psych:  Alert and cooperative. Normal mood and affect.   Lab Results: Recent Labs    01/17/18 1058 01/19/18 0410  WBC 7.6 5.3  HGB 12.9 12.2  HCT 39.0 37.6  PLT 207 164   BMET Recent Labs    01/17/18 1058 01/19/18 0410  NA 139 141  K 3.5 3.3*  CL 103 109  CO2 25 23  GLUCOSE 107* 88  BUN 17 7   CREATININE 1.37* 0.84  CALCIUM 9.3 9.0   LFT Recent Labs    01/17/18 1058  PROT 6.6  ALBUMIN 3.5  AST 24  ALT 11*  ALKPHOS 71  BILITOT 0.6   PT/INR No results for input(s): LABPROT, INR in the last 72 hours.  Studies/Results: Dg Chest 2 View  Result Date: 01/17/2018 CLINICAL DATA:  Balance problems with altered mental status 2 days. EXAM: CHEST - 2 VIEW COMPARISON:  None. FINDINGS: Lungs are adequately inflated without focal airspace consolidation or effusion. Mild cardiomegaly. Calcified plaque over the thoracic aorta. Degenerative change of the  spine and shoulders. IMPRESSION: No acute cardiopulmonary disease. Mild cardiomegaly. Electronically Signed   By: Elberta Fortis M.D.   On: 01/17/2018 15:54   Dg Cervical Spine Complete  Result Date: 01/17/2018 CLINICAL DATA:  Altered mental status 2 days. EXAM: CERVICAL SPINE - COMPLETE 4+ VIEW COMPARISON:  None. FINDINGS: Vertebral body alignment and heights are normal. There is moderate spondylosis throughout the cervical spine. There is mild disc space narrowing at the C3-4, C4-5, C5-6 and C6-7 levels. Prevertebral soft tissues are within normal. There is moderate uncovertebral joint spurring and facet arthropathy. There is suboptimal positioning for proper evaluation of the right-sided neural foramina. There is moderate narrowing of the left neural foramina at the C4-5 and C6-7 levels. Atlantoaxial articulation is within normal. Hardware is present over the mandible. Two metallic densities project over the region of the left maxilla. IMPRESSION: No acute findings. 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 Suboptimal positioning for accurate evaluation of the right neural foramina. Electronically Signed   By: Elberta Fortis M.D.   On: 01/17/2018 15:57   Ct Head Wo Contrast  Result Date: 01/17/2018 CLINICAL DATA:   Altered mental status EXAM: CT HEAD WITHOUT CONTRAST TECHNIQUE: Contiguous axial images were obtained from the base of the skull through the vertex without intravenous contrast. COMPARISON:  None. FINDINGS: Brain: Mild atrophic changes are noted. No findings to suggest acute hemorrhage, acute infarction or mass lesion are noted. Vascular: No hyperdense vessel or unexpected calcification. Skull: Normal. Negative for fracture or focal lesion. Sinuses/Orbits: No acute finding. Other: None. IMPRESSION: Mild atrophic changes without acute abnormality. Electronically Signed   By: Alcide Clever M.D.   On: 01/17/2018 15:59   Mr Brain Wo Contrast (neuro Protocol)  Result Date: 01/17/2018 CLINICAL DATA:  Ataxia EXAM: MRI HEAD WITHOUT CONTRAST TECHNIQUE: Multiplanar, multiecho pulse sequences of the brain and surrounding structures were obtained without intravenous contrast. COMPARISON:  Head CT 01/17/2018 FINDINGS: Brain: The midline structures are normal. There is no acute infarct or acute hemorrhage. No mass lesion, hydrocephalus, dural abnormality or extra-axial collection. Multifocal white matter hyperintensity, most commonly due to chronic ischemic microangiopathy. 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 No age-advanced or lobar predominant atrophy. No chronic microhemorrhage or superficial siderosis. Vascular: Major intracranial arterial and venous sinus flow voids are preserved. Skull and upper cervical spine: Mild narrowing of the upper cervical spinal canal. Normal calvarium. Sinuses/Orbits: No fluid levels or advanced mucosal thickening. No mastoid or middle ear effusion. Normal orbits. IMPRESSION: 1. No acute abnormality. 2. Chronic microvascular ischemia. Electronically Signed   By: Deatra Robinson M.D.   On: 01/17/2018 23:30   US Abdomen Limited Ruq  Result Date: 01/18/2018 CLINICAL DATA:  77 year old female with abdominal pain for 3 weeks. Initial encounter. EXAM: ULTRASOUND  ABDOMEN LIMITED RIGHT UPPER QUADRANT COMPARISON:  09/28/2015 CT. FINDINGS: Gallbladder: Elongated gallbladder spanning over 13.7 cm containing small amount of sludge. No gallbladder wall thickening. No tenderness over the gallbladder during scanning per sonographer. Common bile duct: Diameter: 8.1 mm proximally. 13 mm mid aspect. Distal aspect not visualized secondary to bowel gas. No common bile duct stone identified. Liver: Intrahepatic biliary duct dilation. 2.1 x 1.9 x 2 cm cyst right lobe. Portal vein is patent on color Doppler imaging with normal direction of blood flow towards the liver. IMPRESSION: Elongated gallbladder containing small amount of sludge without ultrasound evidence of inflammation. Similar appearance of dilated  intra and extrahepatic biliary ducts when compared to prior CT. Etiology indeterminate. If further delineation is clinically desired, MRCP may then be considered. 2.1 cm liver cyst. Smaller low-density liver lesions noted on prior CT are not delineated on present ultrasound. Electronically Signed   By: Lacy Duverney M.D.   On: 01/18/2018 18:38   Impression:  1.  Elevated ammonia level.  Modestly elevated.  No clear imaging or clinical or biochemical evidence of liver disease.   Do not think patient has hepatic encephalopathy.  This is very likely a spurious value of unlikely significance.  Further, there is no correlation between ammonia levels and encephalopathy even in patients with known liver disease (which this patient does not have). 2.  Abdominal pain.  Chronic, dating at least 15 years by review of chart.  Patient could have functional abdominal pain or even narcotic bowel syndrome. 3.  Dilated PD/CBD.  Normal LFTs.  Likely from narcotics.  This has been chronic finding on imaging studies dating back at least 7 years. 4.  Altered mental status, improving, medication effect?  Plan:  1.  No new GI work-up needed for patient's longstanding and previously investigated  (CT, endoscopy, colonoscopy, labs) chronic abdominal pain. 2.  Would not pursue further work-up of elevated ammonia in absence of more compelling evidence of liver disease (jaundice, elevated LFTs, imaging appearance consistent with cirrhosis, none of which patient currently has). 3.  Unclear why patient had confusion; it's possible she accidentally took too many of her narcotics (and daughter tells me this has happened before), but patient denies this. 4.  [facility] will sign-off; please call with questions; thank you for the consultation.   LOS: 1 day   Emeline Simpson M  01/19/2018, 1:28 PM  Cell [phone] If no answer or after 5 PM call [phone]

## 2018-01-19 NOTE — Progress Notes (Signed)
PROGRESS NOTE    Samantha Gonzales  NWG:[id number] DOB: 1941/01/30 DOA: 01/17/2018 PCP: System, Pcp Not In   Outpatient Specialists:  Outlaw (GI)   Brief Narrative:  77 year old woman with medical problems including chronic back pain on opioid therapy, chronic abdominal pain, hypertension, and longstanding chewing tobacco user presenting with 2 day history of confusion and difficulty walking.  Note history is obtained by the patient's family at bedside including her daughter and her sister, patient's own account is somewhat limited due to her cognitive state.  At baseline, the patient is independent in her ADLs and IADLs. She walks without assistance. However, 2 days ago she suffered an unwitnessed fall, she fell on her bilateral knees. Since then she's been unsteady on her feet, leaning against household objects to maintain her balance. Has had significant difficulty ambulating. Family reports that she's been somewhat confused, not oriented to year. Her conversation at times has not been appropriate, and she's been noted to be stumbling, family reports that she's also been dizzy. The patient cannot report that she takes for her medications, but does report she is followed by pain management clinic and takes a medication prescribed to 3 times daily. She denies any recent increase in her pain medications that are prescribed however, she does report chronic abdominal pain that she takes frequent over-the-counter medications including NSAIDs. She does not drink alcohol.  Family at bedside to report that she's been evaluated with an EGD as well as colonoscopy, this was over 1 year ago and did not reveal any acute abnormalities. The patient denies any fevers, chills, chest pain, shortness of breath, nausea, vomiting. Her bowel pain is poorly localized, she has bowel movements to 3 times weekly, describes soft.She takes a laxative as needed. She reports back pain as thoracic in location, rates a 5 out of  10.      Assessment & Plan:   Active Problems:   Altered mental state   Altered mental status   Confusion with Impaired ambulation -CT head and MRI negative -B12 low-- replace IM -carotids/echo: EF 70%- hyperdynamic function, carotids: normal -? Ammonia significance -bowel regimen as was on pain meds at home -limit sedating meds -not taking in much PO and not back at baseline yet per family- not safe to be d/c'd -no sign of infection -GI consult- ? EGD needed: last was 2012 -PPI BID ? 30lb weight loss of 5 years  AKI -IVF -improved  Hypertension:  -hold nephrotoxic meds -add norvasc for now  Chronic abdominal pain:  -RUQ u/s - takes NSAIDS- last EGD was 2012- Dr. Dulce Sellar??-- not sure if patient ever followed up with Dr. Bosie Clos -mildly elevated ammonia level -LFTs normal     DVT prophylaxis:  SQ Heparin  Code Status: Full Code   Family Communication: At bedside  Disposition Plan:     Consultants:  GI  Subjective: Up with PT- not able to find way back to room Denies alcohol consumption-- family also denies  Objective: Vitals:   01/19/18 0000 01/19/18 0420 01/19/18 0837 01/19/18 1258  BP: (!) 166/75 (!) 163/129 (!) 160/68 (!) 165/70  Pulse: 85 67 89 74  Resp: 18  20 18   Temp: 97.7 F (36.5 C) 97.9 F (36.6 C) 98.3 F (36.8 C) 98.5 F (36.9 C)  TempSrc: Oral Oral Oral Oral  SpO2: 97% 100% 100% 99%  Weight:      Height:        Intake/Output Summary (Last 24 hours) at 01/19/2018 1318 Last data filed at  01/19/2018 0840 Gross per 24 hour  Intake 240 ml  Output -  Net 240 ml   Filed Weights   01/17/18 2048  Weight: 75.2 kg (165 lb 12.6 oz)    Examination:  General exam: in bed, NAD Respiratory system: no increased work of breathing Cardiovascular system: rrr Gastrointestinal system: +BS, soft Central nervous system: alert Extremities: uncoordinated Skin: No rashes, lesions or ulcers Psychiatry: Judgement and insight appear  normal. Mood & affect appropriate.     Data Reviewed: I have personally reviewed following labs and imaging studies  CBC: Recent Labs  Lab 01/17/18 1058 01/19/18 0410  WBC 7.6 5.3  HGB 12.9 12.2  HCT 39.0 37.6  MCV 99.5 97.4  PLT 207 164   Basic Metabolic Panel: Recent Labs  Lab 01/17/18 1058 01/19/18 0410  NA 139 141  K 3.5 3.3*  CL 103 109  CO2 25 23  GLUCOSE 107* 88  BUN 17 7  CREATININE 1.37* 0.84  CALCIUM 9.3 9.0   GFR: Estimated Creatinine Clearance: 65.7 mL/min (by C-G formula based on SCr of 0.84 mg/dL). Liver Function Tests: Recent Labs  Lab 01/17/18 1058  AST 24  ALT 11*  ALKPHOS 71  BILITOT 0.6  PROT 6.6  ALBUMIN 3.5   Recent Labs  Lab 01/18/18 0432  LIPASE 22   Recent Labs  Lab 01/17/18 1540 01/19/18 0410  AMMONIA 53* 75*   Coagulation Profile: No results for input(s): INR, PROTIME in the last 168 hours. Cardiac Enzymes: Recent Labs  Lab 01/17/18 2130  TROPONINI <0.03   BNP (last 3 results) No results for input(s): PROBNP in the last 8760 hours. HbA1C: Recent Labs    01/18/18 0432  HGBA1C 5.1   CBG: No results for input(s): GLUCAP in the last 168 hours. Lipid Profile: Recent Labs    01/18/18 0432  CHOL 290*  HDL 50  LDLCALC 221*  TRIG 94  CHOLHDL 5.8   Thyroid Function Tests: Recent Labs    01/18/18 0432  TSH 1.191   Anemia Panel: Recent Labs    01/18/18 0432  VITAMINB12 140*   Urine analysis:    Component Value Date/Time   COLORURINE YELLOW 01/17/2018 1655   APPEARANCEUR HAZY (A) 01/17/2018 1655   LABSPEC 1.023 01/17/2018 1655   PHURINE 5.0 01/17/2018 1655   GLUCOSEU NEGATIVE 01/17/2018 1655   HGBUR NEGATIVE 01/17/2018 1655   BILIRUBINUR NEGATIVE 01/17/2018 1655   KETONESUR NEGATIVE 01/17/2018 1655   PROTEINUR 30 (A) 01/17/2018 1655   NITRITE NEGATIVE 01/17/2018 1655   LEUKOCYTESUR SMALL (A) 01/17/2018 1655     ) Recent Results (from the past 240 hour(s))  Urine culture     Status: None    Collection Time: 01/17/18  4:55 PM  Result Value Ref Range Status   Specimen Description URINE, RANDOM  Final   Special Requests NONE  Final   Culture   Final    NO GROWTH Performed at [facility], [address] [place], [place] [address]    Report Status 01/18/2018 FINAL  Final      Anti-infectives (From admission, onward)   None       Radiology Studies: Dg Chest 2 View  Result Date: 01/17/2018 CLINICAL DATA:  Balance problems with altered mental status 2 days. EXAM: CHEST - 2 VIEW COMPARISON:  None. FINDINGS: Lungs are adequately inflated without focal airspace consolidation or effusion. Mild cardiomegaly. Calcified plaque over the thoracic aorta. Degenerative change of the spine and shoulders. IMPRESSION: No acute cardiopulmonary disease. Mild  cardiomegaly. Electronically Signed   By: Elberta Fortis M.D.   On: 01/17/2018 15:54   Dg Cervical Spine Complete  Result Date: 01/17/2018 CLINICAL DATA:  Altered mental status 2 days. EXAM: CERVICAL SPINE - COMPLETE 4+ VIEW COMPARISON:  None. FINDINGS: Vertebral body alignment and heights are normal. There is moderate spondylosis throughout the cervical spine. There is mild disc space narrowing at the C3-4, C4-5, C5-6 and C6-7 levels. Prevertebral soft tissues are within normal. There is moderate uncovertebral joint spurring and facet arthropathy. There is suboptimal positioning for proper evaluation of the right-sided neural foramina. There is moderate narrowing of the left neural foramina at the C4-5 and C6-7 levels. Atlantoaxial articulation is within normal. Hardware is present over the mandible. Two metallic densities project over the region of the left maxilla. IMPRESSION: No acute findings. 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 Suboptimal positioning for accurate evaluation of the right neural foramina.  Electronically Signed   By: Elberta Fortis M.D.   On: 01/17/2018 15:57   Ct Head Wo Contrast  Result Date: 01/17/2018 CLINICAL DATA:  Altered mental status EXAM: CT HEAD WITHOUT CONTRAST TECHNIQUE: Contiguous axial images were obtained from the base of the skull through the vertex without intravenous contrast. COMPARISON:  None. FINDINGS: Brain: Mild atrophic changes are noted. No findings to suggest acute hemorrhage, acute infarction or mass lesion are noted. Vascular: No hyperdense vessel or unexpected calcification. Skull: Normal. Negative for fracture or focal lesion. Sinuses/Orbits: No acute finding. Other: None. IMPRESSION: Mild atrophic changes without acute abnormality. Electronically Signed   By: Alcide Clever M.D.   On: 01/17/2018 15:59   Mr Brain Wo Contrast (neuro Protocol)  Result Date: 01/17/2018 CLINICAL DATA:  Ataxia EXAM: MRI HEAD WITHOUT CONTRAST TECHNIQUE: Multiplanar, multiecho pulse sequences of the brain and surrounding structures were obtained without intravenous contrast. COMPARISON:  Head CT 01/17/2018 FINDINGS: Brain: The midline structures are normal. There is no acute infarct or acute hemorrhage. No mass lesion, hydrocephalus, dural abnormality or extra-axial collection. Multifocal white matter hyperintensity, most commonly due to chronic ischemic microangiopathy. 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 No age-advanced or lobar predominant atrophy. No chronic microhemorrhage or superficial siderosis. Vascular: Major intracranial arterial and venous sinus flow voids are preserved. Skull and upper cervical spine: Mild narrowing of the upper cervical spinal canal. Normal calvarium. Sinuses/Orbits: No fluid levels or advanced mucosal thickening. No mastoid or middle ear effusion. Normal orbits. IMPRESSION: 1. No acute abnormality. 2. Chronic microvascular ischemia. Electronically Signed   By: Deatra Robinson M.D.   On: 01/17/2018 23:30   US Abdomen Limited  Ruq  Result Date: 01/18/2018 CLINICAL DATA:  77 year old female with abdominal pain for 3 weeks. Initial encounter. EXAM: ULTRASOUND ABDOMEN LIMITED RIGHT UPPER QUADRANT COMPARISON:  09/28/2015 CT. FINDINGS: Gallbladder: Elongated gallbladder spanning over 13.7 cm containing small amount of sludge. No gallbladder wall thickening. No tenderness over the gallbladder during scanning per sonographer. Common bile duct: Diameter: 8.1 mm proximally. 13 mm mid aspect. Distal aspect not visualized secondary to bowel gas. No common bile duct stone identified. Liver: Intrahepatic biliary duct dilation. 2.1 x 1.9 x 2 cm cyst right lobe. Portal vein is patent on color Doppler imaging with normal direction of blood flow towards the liver. IMPRESSION: Elongated gallbladder containing small amount of sludge without ultrasound evidence of inflammation. Similar appearance of dilated intra and extrahepatic biliary ducts when compared to prior  CT. Etiology indeterminate. If further delineation is clinically desired, MRCP may then be considered. 2.1 cm liver cyst. Smaller low-density liver lesions noted on prior CT are not delineated on present ultrasound. Electronically Signed   By: Lacy Duverney M.D.   On: 01/18/2018 18:38        Scheduled Meds: . acetaminophen  1,000 mg Oral Q8H  . cyanocobalamin  1,000 mcg Intramuscular Once  . heparin  5,000 Units Subcutaneous Q8H  . lactulose  20 g Oral BID  . pantoprazole (PROTONIX) IV  40 mg Intravenous Q24H   Continuous Infusions: . sodium chloride 75 mL/hr at 01/19/18 0945     LOS: 1 day    Time spent: 35 min    Joseph Art, DO [facility] Pager [phone]  If 7PM-7AM, please contact night-coverage [email or web address] Password [email or web address] 01/19/2018, 1:18 PM

## 2018-01-20 DIAGNOSIS — D519 Vitamin B12 deficiency anemia, unspecified: Secondary | ICD-10-CM

## 2018-01-20 DIAGNOSIS — R410 Disorientation, unspecified: Secondary | ICD-10-CM

## 2018-01-20 LAB — CBC
HEMATOCRIT: 37.6 % (ref 36.0–46.0)
HEMOGLOBIN: 12.3 g/dL (ref 12.0–15.0)
MCH: 31.9 pg (ref 26.0–34.0)
MCHC: 32.7 g/dL (ref 30.0–36.0)
MCV: 97.4 fL (ref 78.0–100.0)
Platelets: 170 10*3/uL (ref 150–400)
RBC: 3.86 MIL/uL — AB (ref 3.87–5.11)
RDW: 13.3 % (ref 11.5–15.5)
WBC: 5.5 10*3/uL (ref 4.0–10.5)

## 2018-01-20 LAB — BASIC METABOLIC PANEL
ANION GAP: 11 (ref 5–15)
BUN: 6 mg/dL (ref 6–20)
CO2: 20 mmol/L — ABNORMAL LOW (ref 22–32)
Calcium: 9.3 mg/dL (ref 8.9–10.3)
Chloride: 108 mmol/L (ref 101–111)
Creatinine, Ser: 0.75 mg/dL (ref 0.44–1.00)
GFR calc non Af Amer: >60 mL/min (ref >60)
Glucose, Bld: 96 mg/dL (ref 65–99)
POTASSIUM: 4 mmol/L (ref 3.5–5.1)
SODIUM: 139 mmol/L (ref 135–145)

## 2018-01-20 MED ORDER — AMLODIPINE BESYLATE 5 MG PO TABS: 5.0000 mg | ORAL_TABLET | Freq: Every day | ORAL | 0 refills | Status: DC

## 2018-01-20 MED ORDER — PANTOPRAZOLE SODIUM 40 MG PO TBEC: 40.0000 mg | DELAYED_RELEASE_TABLET | Freq: Two times a day (BID) | ORAL | 0 refills | Status: DC

## 2018-01-20 MED ORDER — B-12 1000 MCG SL SUBL: 1000.0000 ug | SUBLINGUAL_TABLET | Freq: Every day | SUBLINGUAL | 0 refills | Status: DC

## 2018-01-20 MED ORDER — THIAMINE HCL 100 MG PO TABS: 100.0000 mg | ORAL_TABLET | Freq: Every day | ORAL | 0 refills | Status: DC

## 2018-01-20 MED ORDER — LACTULOSE 10 GM/15ML PO SOLN: 20.0000 g | Freq: Two times a day (BID) | ORAL | 0 refills | Status: DC | PRN

## 2018-01-20 NOTE — [place] FL2 (Signed)
  [place] MEDICAID FL2 LEVEL OF CARE SCREENING TOOL     IDENTIFICATION  Patient Name: Samantha HalonMaxine B Romo Birthdate: 06/14/1941 Sex: female Admission Date (Current Location): 01/17/2018  [facility] [place]   Facility and Address:  [facility], [address], [place], [place] [address]      Provider Number: [id number]  Attending Physician Name and Address:  Joseph ArtVann, Jessica U, DO  Relative Name and Phone Number:       Current Level of Care: Hospital Recommended Level of Care: Skilled Nursing Facility Prior Approval Number:    Date Approved/Denied:   PASRR Number: [id number]  Discharge Plan: SNF    Current Diagnoses: Patient Active Problem List   Diagnosis Date Noted  . Altered mental status 01/18/2018  . Altered mental state 01/17/2018    Orientation RESPIRATION BLADDER Height & Weight     Self, Situation, Place  Normal Continent Weight: 165 lb 12.6 oz (75.2 kg) Height:  5\' 11"  (180.3 cm)  BEHAVIORAL SYMPTOMS/MOOD NEUROLOGICAL BOWEL NUTRITION STATUS      Continent Diet(regular)  AMBULATORY STATUS COMMUNICATION OF NEEDS Skin   Limited Assist Verbally Normal                       Personal Care Assistance Level of Assistance  Bathing, Feeding, Dressing Bathing Assistance: Limited assistance Feeding assistance: Independent Dressing Assistance: Limited assistance     Functional Limitations Info  Sight, Hearing, Speech Sight Info: Adequate Hearing Info: Adequate Speech Info: Impaired(slurred/dysarthria)    SPECIAL CARE FACTORS FREQUENCY  PT (By licensed PT), OT (By licensed OT), Speech therapy     PT Frequency: 5x.wk OT Frequency: 5x/wk     Speech Therapy Frequency: 5x/wk      Contractures Contractures Info: Not present    Additional Factors Info  Code Status, Allergies Code Status Info: Full Allergies Info: Penicillins           Current Medications (01/20/2018):  This is the current hospital active  medication list Current Facility-Administered Medications  Medication Dose Route Frequency Provider Last Rate Last Dose  . amLODipine (NORVASC) tablet 5 mg  5 mg Oral Daily Marlin CanaryVann, Jessica U, DO   5 mg at 01/19/18 1543  . heparin injection 5,000 Units  5,000 Units Subcutaneous Q8H Elder LoveKuhlman, Patrick D, MD   5,000 Units at 01/20/18 0545  . HYDROcodone-acetaminophen (NORCO/VICODIN) 5-325 MG per tablet 1 tablet  1 tablet Oral Q6H PRN Marlin CanaryVann, Jessica U, DO   1 tablet at 01/20/18 0054  . lactulose (CHRONULAC) 10 GM/15ML solution 20 g  20 g Oral BID Marlin CanaryVann, Jessica U, DO   20 g at 01/19/18 2144  . ondansetron (ZOFRAN) injection 4 mg  4 mg Intravenous Q6H PRN Vann, Jessica U, DO      . pantoprazole (PROTONIX) EC tablet 40 mg  40 mg Oral BID Marlin CanaryVann, Jessica U, DO   40 mg at 01/19/18 2144  . thiamine (VITAMIN B-1) tablet 100 mg  100 mg Oral Daily Marlin CanaryVann, Jessica U, DO   100 mg at 01/19/18 1543     Discharge Medications: Please see discharge summary for a list of discharge medications.  Relevant Imaging Results:  Relevant Lab Results:   Additional Information SS#: [id number]  Baldemar LenisElizabeth M Princesa Willig, LCSW

## 2018-01-20 NOTE — Progress Notes (Signed)
Noted plans fro SNF. I agree with recommendation. [phone]

## 2018-01-20 NOTE — Progress Notes (Signed)
CSW met with patient and patient's granddaughter at bedside to discuss discharge plan and potential for SNF. Patient said she was agreeable if that was that recommendation, but that she really feels like she could go home. Patient's granddaughter called patient's daughter on the phone and said that they would really like PT to walk with her again to see what she needs. PT was able to meet with patient, and per report will be updating recommendations for patient to return home with home health and 24 hour supervision for the first few days. Patient's granddaughter says someone can stay with the patient for a few days at discharge to ensure her safety.  SNF placement no longer needed. CSW signing off.  Laveda Abbe,  Clinical Social Worker [phone]

## 2018-01-20 NOTE — Progress Notes (Signed)
Occupational Therapy Treatment Patient Details Name: Samantha Gonzales MRN: [id number] DOB: 02/20/1941 Today's Date: 01/20/2018    History of present illness 77 year old woman with medical problems including chronic back pain on opioid therapy, chronic abdominal pain, hypertension, and longstanding chewing tobacco user. Patient presents to the emergency department with unsteady gait and altered mental status. The daughter gives the history that the patient seems confused and she is not able to walk over the last 2 days. CT and MRI were negative for stroke.    OT comments  Pt progressing towards established OT goals. Providing education on safe tub transfer and use of shower seat for safety and to decrease fall risk. Pt and daughter verbalize understanding. Pt continue to present with decreased cognition and balance, but deficits have decreased since previous session. Pt near baseline function. Update dc recommendation to home with 24 hour assist. Discussed need for initial 24 hour supervision/assistance; daughter verbalized understanding. Answering all pt and family questions in preparation for dc later today.    Follow Up Recommendations  No OT follow up;Supervision/Assistance - 24 hour    Equipment Recommendations  Other (comment)(Family confirming that will purchase shower seat after dc)    Recommendations for Other Services Rehab consult;PT consult;Speech consult    Precautions / Restrictions Precautions Precautions: Fall Restrictions Weight Bearing Restrictions: No       Mobility Bed Mobility Overal bed mobility: Needs Assistance;Modified Independent Bed Mobility: Supine to Sit;Sit to Supine     Supine to sit: Modified independent (Device/Increase time) Sit to supine: Modified independent (Device/Increase time)   General bed mobility comments: no physical assist needed, HOB elevated  Transfers Overall transfer level: Needs assistance Equipment used: None Transfers: Sit  to/from Stand Sit to Stand: Supervision         General transfer comment: pt with increased stability this date, con't to have trunk flexion however granddaughter states "that's how she always is"    Balance Overall balance assessment: Needs assistance Sitting-balance support: No upper extremity supported;Feet supported Sitting balance-Leahy Scale: Good Sitting balance - Comments: Able to reach forward and adjust socks   Standing balance support: During functional activity Standing balance-Leahy Scale: Fair Standing balance comment: reaching for something to hold onto                           ADL either performed or assessed with clinical judgement   ADL Overall ADL's : Needs assistance/impaired                                 Tub/ Shower Transfer: Tub transfer;Min guard;Ambulation;Shower Field seismologistseat Tub/Shower Transfer Details (indicate cue type and reason): Pt performing tub transfer with Min guard for safety. Mod VCs for sequencing and follow educational cues Functional mobility during ADLs: Min guard General ADL Comments: Pt demonstrating increased balance and cognition compared to evaluation. Pt continued to present with some balance and cognitive deficits.      Vision       Perception     Praxis      Cognition Arousal/Alertness: Awake/alert Behavior During Therapy: WFL for tasks assessed/performed Overall Cognitive Status: History of cognitive impairments - at baseline Area of Impairment: Safety/judgement;Awareness;Problem solving;Following commands;Memory;Attention                   Current Attention Level: Sustained Memory: Decreased short-term memory Following Commands: Follows multi-step commands with increased time;Follows multi-step commands inconsistently(easily  distracted inhibiting multistep command follow) Safety/Judgement: Decreased awareness of safety;Decreased awareness of deficits Awareness: Emergent Problem Solving:  Requires verbal cues;Requires tactile cues General Comments: Pt continues to be highly distractable. requiring Max verbal and visual cues for reutrning to room and using signs. Pt requiring verbal cues to follow educational sequencing.        Exercises     Shoulder Instructions       General Comments Pt's daughter present during session. Discussed purchase of shower seat for safety adn daughter stating she would purchase after dc. Pt's daughter confirming she is aware of 24/7 supervision need at dc    Pertinent Vitals/ Pain       Pain Assessment: No/denies pain Pain Location: ("but it hurts all the time") Pain Intervention(s): Monitored during session  Home Living                                          Prior Functioning/Environment              Frequency  Min 2X/week        Progress Toward Goals  OT Goals(current goals can now be found in the care plan section)  Progress towards OT goals: Progressing toward goals  Acute Rehab OT Goals Patient Stated Goal: go home OT Goal Formulation: With patient/family Time For Goal Achievement: 02/01/18 Potential to Achieve Goals: Good ADL Goals Pt Will Perform Grooming: with set-up;with supervision;standing Pt Will Perform Upper Body Dressing: with set-up;with supervision;sitting Pt Will Perform Lower Body Dressing: with set-up;with supervision;sit to/from stand Pt Will Transfer to Toilet: with set-up;with supervision;ambulating;bedside commode Pt Will Perform Toileting - Clothing Manipulation and hygiene: with set-up;with supervision;sit to/from stand Pt Will Perform Tub/Shower Transfer: Tub transfer;with min guard assist;ambulating;3 in 1 Additional ADL Goal #1: Pt will performing ADLs in distracting environment with 1-2 verbal cues  Plan Discharge plan needs to be updated    Co-evaluation                 AM-PAC PT "6 Clicks" Daily Activity     Outcome Measure   Help from another person  eating meals?: None Help from another person taking care of personal grooming?: A Little Help from another person toileting, which includes using toliet, bedpan, or urinal?: A Little Help from another person bathing (including washing, rinsing, drying)?: A Lot Help from another person to put on and taking off regular upper body clothing?: A Little Help from another person to put on and taking off regular lower body clothing?: A Little 6 Click Score: 18    End of Session Equipment Utilized During Treatment: Gait belt  OT Visit Diagnosis: Unsteadiness on feet (R26.81);Other abnormalities of gait and mobility (R26.89);Muscle weakness (generalized) (M62.81);History of falling (Z91.81);Other symptoms and signs involving cognitive function;Pain Pain - part of body: (Stomach)   Activity Tolerance Patient tolerated treatment well   Patient Left in bed;with call bell/phone within reach;with bed alarm set;with family/visitor present   Nurse Communication Mobility status        Time: 1340-1356 OT Time Calculation (min): 16 min  Charges: OT General Charges $OT Visit: 1 Visit OT Treatments $Self Care/Home Management : 8-22 mins  Rennie Hack MSOT, OTR/L Acute Rehab Pager: [phone] Office: [phone]   Theodoro Grist Nikeshia Keetch 01/20/2018, 4:06 PM

## 2018-01-20 NOTE — Progress Notes (Signed)
Physical Therapy Treatment Patient Details Name: Samantha Gonzales MRN: [id number] DOB: 03/17/1941 Today's Date: 01/20/2018    History of Present Illness 77 year old woman with medical problems including chronic back pain on opioid therapy, chronic abdominal pain, hypertension, and longstanding chewing tobacco user. Patient presents to the emergency department with unsteady gait and altered mental status. The daughter gives the history that the patient seems confused and she is not able to walk over the last 2 days. CT and MRI were negative for stroke.     PT Comments    Pt much improved both functionally and cognitively today compared to the last 2 days. Pt safe to d/c home with support of family 24/7 assist for the next 2-3 days. Per pt's granddaughter pt's ambulation and cognition near baseline. Pt tolerated amb of 300' without AD and demo'd no episode of LOB. Pt carried a 3 pound object to mimic home set up and had no difficulty. Pt also was able to maintain balance during moderate perturbations in all directions. Acute PT to con't to follow.   Follow Up Recommendations  No PT follow up;Supervision/Assistance - 24 hour     Equipment Recommendations  None recommended by PT    Recommendations for Other Services       Precautions / Restrictions Precautions Precautions: Fall Restrictions Weight Bearing Restrictions: No    Mobility  Bed Mobility Overal bed mobility: Needs Assistance;Modified Independent Bed Mobility: Supine to Sit;Sit to Supine     Supine to sit: Modified independent (Device/Increase time) Sit to supine: Modified independent (Device/Increase time)   General bed mobility comments: no physical assist needed, HOB elevated  Transfers Overall transfer level: Needs assistance Equipment used: None Transfers: Sit to/from Stand Sit to Stand: Supervision         General transfer comment: pt with increased stability this date, con't to have trunk flexion however  granddaughter states "that's how she always is"  Ambulation/Gait Ambulation/Gait assistance: Min guard Ambulation Distance (Feet): 300 Feet Assistive device: None Gait Pattern/deviations: Step-through pattern;Decreased stride length;Trunk flexed Gait velocity: slow Gait velocity interpretation: Below normal speed for age/gender General Gait Details: pt more steady than yesterday. Pt with no cross over gait pattern however does present with L/R lateral sway. Pt with extremely short step length for patients height. no overt episode of LOB. Pt's granddaughter states this is pretty close to home   Stairs Stairs: Yes   Stair Management: One rail Right;Alternating pattern Number of Stairs: 5 General stair comments: pt more steady and didn't demo any ataxia this date.  Wheelchair Mobility    Modified Rankin (Stroke Patients Only)       Balance Overall balance assessment: Needs assistance Sitting-balance support: No upper extremity supported;Feet supported Sitting balance-Leahy Scale: Good     Standing balance support: During functional activity Standing balance-Leahy Scale: Fair                 High Level Balance Comments: provided moderate pertebations in all directions t/o ambulation, pt able to maintain balance and self correct without assist.             Cognition Arousal/Alertness: Awake/alert Behavior During Therapy: WFL for tasks assessed/performed Overall Cognitive Status: History of cognitive impairments - at baseline                       Memory: Decreased short-term memory Following Commands: (easily distracted inhibiting multistep command follow) Safety/Judgement: Decreased awareness of safety;Decreased awareness of deficits Awareness: Emergent   General  Comments: pt reports "i can't read well" pt doesn't keep track of days. pt more aware of situation      Exercises      General Comments General comments (skin integrity, edema, etc.):  spoke with patient and granddaughter regarding pt requiring 24/7 assist upon d/c home for atleast 2-3 days      Pertinent Vitals/Pain Pain Assessment: 0-10 Pain Score: 3  Pain Location: stomach("but it hurts all the time") Pain Descriptors / Indicators: Discomfort;Grimacing Pain Intervention(s): Monitored during session    Home Living                      Prior Function            PT Goals (current goals can now be found in the care plan section) Acute Rehab PT Goals Patient Stated Goal: go home Progress towards PT goals: Progressing toward goals    Frequency    Min 4X/week      PT Plan Discharge plan needs to be updated    Co-evaluation              AM-PAC PT "6 Clicks" Daily Activity  Outcome Measure  Difficulty turning over in bed (including adjusting bedclothes, sheets and blankets)?: None Difficulty moving from lying on back to sitting on the side of the bed? : None Difficulty sitting down on and standing up from a chair with arms (e.g., wheelchair, bedside commode, etc,.)?: A Little Help needed moving to and from a bed to chair (including a wheelchair)?: A Little Help needed walking in hospital room?: A Little Help needed climbing 3-5 steps with a railing? : A Little 6 Click Score: 20    End of Session Equipment Utilized During Treatment: Gait belt Activity Tolerance: Patient tolerated treatment well Patient left: in bed;with call bell/phone within reach;with bed alarm set;with family/visitor present Nurse Communication: Mobility status PT Visit Diagnosis: Unsteadiness on feet (R26.81)     Time: 1610-9604 PT Time Calculation (min) (ACUTE ONLY): 22 min  Charges:  $Gait Training: 8-22 mins                    G Codes:       Lewis Shock, PT, DPT Pager #: [phone] Office #: [phone]    Samantha Gonzales 01/20/2018, 12:12 PM

## 2018-01-20 NOTE — Progress Notes (Signed)
Pt discharged from unit. No new concerns. IV removed. Pt and family educated on discharge information and follow up.

## 2018-01-20 NOTE — Discharge Summary (Signed)
Physician Discharge Summary  Drema HalonMaxine B Foglio UJW:[id number] DOB: 09/08/1941 DOA: 01/17/2018  PCP: System, Pcp Not In  Admit date: 01/17/2018 Discharge date: 01/20/2018   Recommendations for Outpatient Follow-Up:   1. Improved after B12 replacement and thimaine supplementation-monitor levels 2. Family to provide supervision   Discharge Diagnosis:   Active Problems:   Altered mental state   Altered mental status   Discharge disposition:  Home  Discharge Condition: Improved.  Diet recommendation: Regular.  Wound care: None.   History of Present Illness:   77 year old woman with medical problems including chronic back pain on opioid therapy, chronic abdominal pain, hypertension, and longstanding chewing tobacco user presenting with 2 day history of confusion and difficulty walking.  Note history is obtained by the patient's family at bedside including her daughter and her sister, patient's own account is somewhat limited due to her cognitive state.  At baseline, the patient is independent in her ADLs and IADLs. She walks without assistance. However, 2 days ago she suffered an unwitnessed fall, he fell on her bilateral knees. Since then she's been unsteady on her feet, leaning against household objects to maintain her balance. Has had significant difficulty ambulating. Family reports that she's been somewhat confused, not oriented to year. Her conversation at times has not been appropriate, and she's been noted to be stumbling, family reports that she's also been dizzy. The patient cannot report that she takes for her medications, but does report she is followed by pain management clinic and takes a medication prescribed to 3 times daily. She denies any recent increase in her pain medications that are prescribed however, she does report chronic abdominal pain that she takes frequent over-the-counter medications including NSAIDs. She does not drink alcohol.  Family at bedside to  report that she's been evaluated with an EGD as well as colonoscopy, this was over 1 year ago and did not reveal any acute abnormalities. The patient denies any fevers, chills, chest pain, shortness of breath, nausea, vomiting. Her bowel pain is poorly localized, she has bowel movements to 3 times weekly, describes soft.She takes a laxative as needed. She reports back pain as thoracic in location, rates a 5 out of 10.  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 Formerly enjoyed working in her garden however she had to stop due to her chronic back pain.     Hospital Course by Problem:   Confusion with Impaired ambulation due to low B12/thiamine vs medication misuse -CT head and MRI negative -B12 low-- replaced IM x 2 and now sublingual -carotids/echo: EF 70%- hyperdynamic function, carotids: normal -? Ammonia significance -bowel regimen as was on pain meds at home -limit sedating meds -no sign of infection -PPI BID   AKI -IVF -improved  Hypertension:  -resume home emds -add norvasc for now  Chronic abdominal pain:  -RUQ u/s stable - takes NSAIDS- last EGD was 2012- Dr. Dulce Sellarutlaw??-- not sure if patient ever followed up with Dr. Bosie ClosSchooler -seen by GI-- no further intervention -mildly elevated ammonia level -LFTs normal      Medical Consultants:    gi   Discharge Exam:   Vitals:   01/20/18 0750 01/20/18 1157  BP: (!) 147/58 (!) 150/57  Pulse: 74   Resp: 16   Temp: 98.5 F (36.9 C)   SpO2: 100%    Vitals:   01/20/18 0008 01/20/18 0439 01/20/18 0750 01/20/18 1157  BP: (!) 150/65 (!) 149/63 Marland Kitchen(!)  147/58 (!) 150/57  Pulse: 77 71 74   Resp: 20 20 16    Temp: 98.4 F (36.9 C) 98.1 F (36.7 C) 98.5 F (36.9 C)   TempSrc: Oral Oral Oral   SpO2: 97% 98% 100%   Weight:      Height:        Gen:  NAD   The results of significant diagnostics from this  hospitalization (including imaging, microbiology, ancillary and laboratory) are listed below for reference.     Procedures and Diagnostic Studies:   Dg Chest 2 View  Result Date: 01/17/2018 CLINICAL DATA:  Balance problems with altered mental status 2 days. EXAM: CHEST - 2 VIEW COMPARISON:  None. FINDINGS: Lungs are adequately inflated without focal airspace consolidation or effusion. Mild cardiomegaly. Calcified plaque over the thoracic aorta. Degenerative change of the spine and shoulders. IMPRESSION: No acute cardiopulmonary disease. Mild cardiomegaly. Electronically Signed   By: Elberta Fortis M.D.   On: 01/17/2018 15:54   Dg Cervical Spine Complete  Result Date: 01/17/2018 CLINICAL DATA:  Altered mental status 2 days. EXAM: CERVICAL SPINE - COMPLETE 4+ VIEW COMPARISON:  None. FINDINGS: Vertebral body alignment and heights are normal. There is moderate spondylosis throughout the cervical spine. There is mild disc space narrowing at the C3-4, C4-5, C5-6 and C6-7 levels. Prevertebral soft tissues are within normal. There is moderate uncovertebral joint spurring and facet arthropathy. There is suboptimal positioning for proper evaluation of the right-sided neural foramina. There is moderate narrowing of the left neural foramina at the C4-5 and C6-7 levels. Atlantoaxial articulation is within normal. Hardware is present over the mandible. Two metallic densities project over the region of the left maxilla. IMPRESSION: No acute findings. 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 Suboptimal positioning for accurate evaluation of the right neural foramina. Electronically Signed   By: Elberta Fortis M.D.   On: 01/17/2018 15:57   Ct Head Wo Contrast  Result Date: 01/17/2018 CLINICAL DATA:  Altered mental status EXAM: CT HEAD WITHOUT CONTRAST TECHNIQUE: Contiguous axial images were  obtained from the base of the skull through the vertex without intravenous contrast. COMPARISON:  None. FINDINGS: Brain: Mild atrophic changes are noted. No findings to suggest acute hemorrhage, acute infarction or mass lesion are noted. Vascular: No hyperdense vessel or unexpected calcification. Skull: Normal. Negative for fracture or focal lesion. Sinuses/Orbits: No acute finding. Other: None. IMPRESSION: Mild atrophic changes without acute abnormality. Electronically Signed   By: Alcide Clever M.D.   On: 01/17/2018 15:59   Mr Brain Wo Contrast (neuro Protocol)  Result Date: 01/17/2018 CLINICAL DATA:  Ataxia EXAM: MRI HEAD WITHOUT CONTRAST TECHNIQUE: Multiplanar, multiecho pulse sequences of the brain and surrounding structures were obtained without intravenous contrast. COMPARISON:  Head CT 01/17/2018 FINDINGS: Brain: The midline structures are normal. There is no acute infarct or acute hemorrhage. No mass lesion, hydrocephalus, dural abnormality or extra-axial collection. Multifocal white matter hyperintensity, most commonly due to chronic ischemic microangiopathy. 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 No age-advanced or lobar predominant atrophy. No chronic microhemorrhage or superficial siderosis. Vascular: Major intracranial arterial and venous sinus flow voids are preserved. Skull and upper cervical spine: Mild narrowing of the upper cervical spinal canal. Normal calvarium. Sinuses/Orbits: No fluid levels or advanced mucosal thickening. No mastoid or middle ear effusion. Normal orbits. IMPRESSION: 1. No acute abnormality. 2. Chronic microvascular ischemia. Electronically Signed   By: Caryn Bee  Chase Picket M.D.   On: 01/17/2018 23:30   US Abdomen Limited Ruq  Result Date: 01/18/2018 CLINICAL DATA:  77 year old female with abdominal pain for 3 weeks. Initial encounter. EXAM: ULTRASOUND ABDOMEN LIMITED RIGHT UPPER QUADRANT COMPARISON:  09/28/2015 CT. FINDINGS: Gallbladder:  Elongated gallbladder spanning over 13.7 cm containing small amount of sludge. No gallbladder wall thickening. No tenderness over the gallbladder during scanning per sonographer. Common bile duct: Diameter: 8.1 mm proximally. 13 mm mid aspect. Distal aspect not visualized secondary to bowel gas. No common bile duct stone identified. Liver: Intrahepatic biliary duct dilation. 2.1 x 1.9 x 2 cm cyst right lobe. Portal vein is patent on color Doppler imaging with normal direction of blood flow towards the liver. IMPRESSION: Elongated gallbladder containing small amount of sludge without ultrasound evidence of inflammation. Similar appearance of dilated intra and extrahepatic biliary ducts when compared to prior CT. Etiology indeterminate. If further delineation is clinically desired, MRCP may then be considered. 2.1 cm liver cyst. Smaller low-density liver lesions noted on prior CT are not delineated on present ultrasound. Electronically Signed   By: Lacy Duverney M.D.   On: 01/18/2018 18:38     Labs:   Basic Metabolic Panel: Recent Labs  Lab 01/17/18 1058 01/19/18 0410 01/20/18 0046  NA 139 141 139  K 3.5 3.3* 4.0  CL 103 109 108  CO2 25 23 20*  GLUCOSE 107* 88 96  BUN 17 7 6   CREATININE 1.37* 0.84 0.75  CALCIUM 9.3 9.0 9.3  MG  --  2.0  --    GFR Estimated Creatinine Clearance: 69 mL/min (by C-G formula based on SCr of 0.75 mg/dL). Liver Function Tests: Recent Labs  Lab 01/17/18 1058  AST 24  ALT 11*  ALKPHOS 71  BILITOT 0.6  PROT 6.6  ALBUMIN 3.5   Recent Labs  Lab 01/18/18 0432  LIPASE 22   Recent Labs  Lab 01/17/18 1540 01/19/18 0410  AMMONIA 53* 75*   Coagulation profile No results for input(s): INR, PROTIME in the last 168 hours.  CBC: Recent Labs  Lab 01/17/18 1058 01/19/18 0410 01/20/18 0046  WBC 7.6 5.3 5.5  HGB 12.9 12.2 12.3  HCT 39.0 37.6 37.6  MCV 99.5 97.4 97.4  PLT 207 164 170   Cardiac Enzymes: Recent Labs  Lab 01/17/18 2130  TROPONINI  <0.03   BNP: Invalid input(s): POCBNP CBG: No results for input(s): GLUCAP in the last 168 hours. D-Dimer No results for input(s): DDIMER in the last 72 hours. Hgb A1c Recent Labs    01/18/18 0432  HGBA1C 5.1   Lipid Profile Recent Labs    01/18/18 0432  CHOL 290*  HDL 50  LDLCALC 221*  TRIG 94  CHOLHDL 5.8   Thyroid function studies Recent Labs    01/18/18 0432  TSH 1.191   Anemia work up Recent Labs    01/18/18 0432  VITAMINB12 140*   Microbiology Recent Results (from the past 240 hour(s))  Urine culture     Status: None   Collection Time: 01/17/18  4:55 PM  Result Value Ref Range Status   Specimen Description URINE, RANDOM  Final   Special Requests NONE  Final   Culture   Final    NO GROWTH Performed at [facility], [address], [place], [place] [address]    Report Status 01/18/2018 FINAL  Final     Discharge Instructions:   Discharge Instructions    Diet general   Complete by:  As directed  Discharge instructions   Complete by:  As directed    Establish with PCP Bowel regimen while on pain medications-- use pain medications sparingly-- only when absolutely necessary Supervision by family   Increase activity slowly   Complete by:  As directed      Allergies as of 01/20/2018      Reactions   Penicillins Hives, Itching, Rash   Has patient had a PCN reaction causing immediate rash, facial/tongue/throat swelling, SOB or lightheadedness with hypotension: Yes Has patient had a PCN reaction causing severe rash involving mucus membranes or skin necrosis: yes Has patient had a PCN reaction that required hospitalization: unk Has patient had a PCN reaction occurring within the last 10 years: unk If all of the above answers are "NO", then may proceed with Cephalosporin use.      Medication List    STOP taking these medications   irbesartan 300 MG tablet Commonly known as:  AVAPRO   naproxen 500 MG tablet Commonly known as:   NAPROSYN   traMADol 50 MG tablet Commonly known as:  ULTRAM     TAKE these medications   amLODipine 5 MG tablet Commonly known as:  NORVASC Take 1 tablet (5 mg total) by mouth daily. Start taking on:  01/21/2018   B-12 1000 MCG Subl Place 1,000 mcg under the tongue daily.   HYDROcodone-acetaminophen 7.5-325 MG tablet Commonly known as:  NORCO Take 1 tablet by mouth 3 (three) times daily as needed for pain.   lactulose 10 GM/15ML solution Commonly known as:  CHRONULAC Take 30 mLs (20 g total) by mouth 2 (two) times daily as needed for mild constipation. For daily bowel movements   pantoprazole 40 MG tablet Commonly known as:  PROTONIX Take 1 tablet (40 mg total) by mouth 2 (two) times daily.   thiamine 100 MG tablet Take 1 tablet (100 mg total) by mouth daily. Start taking on:  01/21/2018      Follow-up Information    establish with PCP Follow up.            Time coordinating discharge: 37 min  Signed:  Joseph Art   [facility] 01/20/2018, 12:10 PM

## 2018-01-20 NOTE — Care Management Note (Signed)
Case Management Note  Patient Details  Name: Samantha HalonMaxine B Mofield MRN: [id number] Date of Birth: 11/14/1940  Subjective/Objective:       Pt admitted with altered mental status. She is from home alone but has support from her daughter and sister. Pt has no PCP.             Action/Plan: PT today recommending no PT f/u and no DME. They do recommend 24 hour supervision. CM spoke to the patient and her daughter and she will have 24/7 supervision at least through Tuesday.  CM provided the daughter with the [facility] number to assist her in finding her mother a PCP.  Daughter to provide transportation home.     Expected Discharge Date:  01/20/18               Expected Discharge Plan:  Skilled Nursing Facility  In-House Referral:  Clinical Social Work  Discharge planning Services  CM Consult(no PCP)  Post Acute Care Choice:    Choice offered to:     DME Arranged:    DME Agency:     HH Arranged:    HH Agency:     Status of Service:  Completed, signed off  If discussed at [employer] Meetings, dates discussed:    Additional Comments:  Kermit BaloKelli F Jireh Vinas, RN 01/20/2018, 12:45 PM

## 2018-01-21 ENCOUNTER — Other Ambulatory Visit: Payer: Self-pay

## 2018-01-21 ENCOUNTER — Emergency Department (HOSPITAL_COMMUNITY): Payer: Medicare Other

## 2018-01-21 ENCOUNTER — Encounter (HOSPITAL_COMMUNITY): Payer: Self-pay | Admitting: Emergency Medicine

## 2018-01-21 ENCOUNTER — Emergency Department (HOSPITAL_COMMUNITY)
Admission: EM | Admit: 2018-01-21 | Discharge: 2018-01-21 | Disposition: A | Discharge status: Home / Self Care | Payer: Medicare Other | Attending: Emergency Medicine | Admitting: Emergency Medicine

## 2018-01-21 DIAGNOSIS — K5900 Constipation, unspecified: Secondary | ICD-10-CM | POA: Insufficient documentation

## 2018-01-21 DIAGNOSIS — I10 Essential (primary) hypertension: Secondary | ICD-10-CM | POA: Insufficient documentation

## 2018-01-21 DIAGNOSIS — Z79899 Other long term (current) drug therapy: Secondary | ICD-10-CM | POA: Insufficient documentation

## 2018-01-21 DIAGNOSIS — K5909 Other constipation: Secondary | ICD-10-CM

## 2018-01-21 NOTE — ED Provider Notes (Signed)
[facility] Provider Note   CSN: [id number] Arrival date & time: 01/21/18  [id number]     History   Chief Complaint Chief Complaint  Patient presents with  . Constipation    HPI Samantha Gonzales is a 77 y.o. female with a history of hypertension who presents the emergency department today for constipation.  Daughter at bedside helps provide history.  She was recently admitted to the hospital from 3/24-3/27 for confusion and altered mental status.  Patient had a reassuring workup with a negative CT head and brain MRI.  Her B12 was noted to be low and this was replaced IM.  At that time the patient was reported to have chronic abdominal pain was evaluated by Dr. Dulce Sellarutlaw of [facility].  Patient is reported to have a history of chronic abdominal pain (15 years) that is thought to be either chronic opioid use or functional abdominal pain.  She has had reassuring endoscopic and colonoscopy in February 2017 by Dr. Bosie ClosSchooler that showed polyps but otherwise unremarkable.  She also does have a history of biliary ductal dilation with normal LFTs.  Patient states that she is presenting because she has not had a bowel movement since her stay in the hospital.  She states that she has been taking MiraLAX for her symptoms without relief.  She was discharged home on lactulose but has not filled this prescription.  She has her normal generalized "upset stomach" but no new belly pain.  No distention.  She is still passing gas.  Denies previous abdominal history.  No fever, chills, chest pain, shortness of breath, nausea/vomiting/diarrhea, or urinary symptoms.  HPI  Past Medical History:  Diagnosis Date  . Hypertension     Patient Active Problem List   Diagnosis Date Noted  . Altered mental status 01/18/2018  . Altered mental state 01/17/2018    History reviewed. No pertinent surgical history.   OB History   None      Home Medications    Prior to Admission  medications   Medication Sig Start Date End Date Taking? Authorizing Provider  amLODipine (NORVASC) 5 MG tablet Take 1 tablet (5 mg total) by mouth daily. 01/21/18   Joseph ArtVann, Jessica U, DO  Cyanocobalamin (B-12) 1000 MCG SUBL Place 1,000 mcg under the tongue daily. 01/20/18   Joseph ArtVann, Jessica U, DO  HYDROcodone-acetaminophen (NORCO) 7.5-325 MG tablet Take 1 tablet by mouth 3 (three) times daily as needed for pain. 12/24/17   [provider]  lactulose (CHRONULAC) 10 GM/15ML solution Take 30 mLs (20 g total) by mouth 2 (two) times daily as needed for mild constipation. For daily bowel movements 01/20/18   Marlin CanaryVann, Jessica U, DO  pantoprazole (PROTONIX) 40 MG tablet Take 1 tablet (40 mg total) by mouth 2 (two) times daily. 01/20/18   Joseph ArtVann, Jessica U, DO  thiamine 100 MG tablet Take 1 tablet (100 mg total) by mouth daily. 01/21/18   Joseph ArtVann, Jessica U, DO    Family History No family history on file.  Social History Social History   Tobacco Use  . Smoking status: Never Smoker  . Smokeless tobacco: Never Used  Substance Use Topics  . Alcohol use: No  . Drug use: No     Allergies   Penicillins   Review of Systems Review of Systems  All other systems reviewed and are negative.    Physical Exam Updated Vital Signs BP (!) 163/73 (BP Location: Left Arm)   Pulse 88   Temp 98.5  F (36.9 C) (Oral)   Resp 14   Ht 6' (1.829 m)   Wt 81.6 kg (180 lb)   SpO2 99%   BMI 24.41 kg/m   Physical Exam  Constitutional: She appears well-developed and well-nourished.  HENT:  Head: Normocephalic and atraumatic.  Right Ear: External ear normal.  Left Ear: External ear normal.  Nose: Nose normal.  Mouth/Throat: Uvula is midline, oropharynx is clear and moist and mucous membranes are normal. No tonsillar exudate.  Eyes: Pupils are equal, round, and reactive to light. Right eye exhibits no discharge. Left eye exhibits no discharge. No scleral icterus.  Neck: Trachea normal. Neck supple. No spinous  process tenderness present. No neck rigidity. Normal range of motion present.  Cardiovascular: Normal rate, regular rhythm and intact distal pulses.  No murmur heard. Pulses:      Radial pulses are 2+ on the right side, and 2+ on the left side.       Dorsalis pedis pulses are 2+ on the right side, and 2+ on the left side.       Posterior tibial pulses are 2+ on the right side, and 2+ on the left side.  No lower extremity swelling or edema. Calves symmetric in size bilaterally.  Pulmonary/Chest: Effort normal and breath sounds normal. She exhibits no tenderness.  Abdominal: Soft. Bowel sounds are normal. She exhibits no distension. There is no tenderness. There is no rigidity, no rebound, no guarding, no CVA tenderness, no tenderness at McBurney's point and negative Murphy's sign. No hernia.  Genitourinary:  Genitourinary Comments: Chaperone was present.  Patient without pain around the rectal area. Perianal sensory intact. No external fissure's palpated or examined. Noted external hemorrhoids without thrombosis. No induration of the skin or swelling. Digital Rectal Exam reveals sphincter with good tone. No masses palpated. No hard stool or impacted stool in rectal vault. Stool color is brown with no overt blood or melena.  Musculoskeletal: She exhibits no edema.  Lymphadenopathy:    She has no cervical adenopathy.  Neurological: She is alert.  Skin: Skin is warm and dry. No rash noted. She is not diaphoretic.  Psychiatric: She has a normal mood and affect.  Nursing note and vitals reviewed.    ED Treatments / Results  Labs (all labs ordered are listed, but only abnormal results are displayed) Labs Reviewed - No data to display  EKG None  Radiology Dg Abdomen 1 View  Result Date: 01/21/2018 CLINICAL DATA:  Constipation for several days EXAM: ABDOMEN - 1 VIEW COMPARISON:  None. FINDINGS: Scattered large and small bowel gas is noted. Mild retained fecal material is seen although no  obstructive changes are seen. No abnormal mass or abnormal calcifications are noted. Degenerative changes of the hip joints and lumbar spine are noted. IMPRESSION: Mild retained fecal material although no significant constipation is noted. Electronically Signed   By: Alcide Clever M.D.   On: 01/21/2018 08:47    Procedures Procedures (including critical care time)  Medications Ordered in ED Medications - No data to display   Initial Impression / Assessment and Plan / ED Course  I have reviewed the triage vital signs and the nursing notes.  Pertinent labs & imaging results that were available during my care of the patient were reviewed by me and considered in my medical decision making (see chart for details).     77 y.o. female with a history of hypertension who presents the emergency department today for constipation.  Daughter at bedside helps provide history.  She was recently admitted to the hospital from 3/24-3/27 for confusion and altered mental status.  Patient had a reassuring workup with a negative CT head and brain MRI.  Her B12 was noted to be low and this was replaced IM.  At that time the patient was reported to have chronic abdominal pain was evaluated by Dr. Dulce Sellar of [facility].  Patient is reported to have a history of chronic abdominal pain (15 years) that is thought to be either chronic opioid use or functional abdominal pain.  She has had reassuring endoscopy and colonoscopy in February 2017 by Dr. Bosie Clos that showed polyps but otherwise unremarkable.  Patient states that she is presenting because she has not had a bowel movement since her stay in the hospital.  She states that she has been taking MiraLAX for her symptoms without relief.  She was discharged home on lactulose but has not filled this prescription.  She has her normal generalized "upset stomach" but no new belly pain.  No distention.  She is still passing gas.  Denies previous abdominal history.  No fever, chills,  chest pain, shortness of breath, nausea/vomiting/diarrhea, or urinary symptoms.  Patient's vital signs are reassuring.  On exam the patient abdomen is soft, nondistended and nontender to palpation.  Bowel sounds auscultated in all 4 quadrants.  No high-pitched, tinkling bowel sounds.  Digital rectal exam without evidence of impaction.  X-ray with mild retained fecal matter.  No evidence of obstruction.  Given the patient does not have tenderness, distention, nausea/vomiting do not feel the patient needs further workup with a CT scan at this time.  She has been prescribed lactulose at home but has not filled this.  Recommend the patient fill this and continue taking MiraLAX as well as lactulose for her symptoms.  She has a GI doctor that I recommend she follow-up with if this becomes a recurring issue. I advised the patient to follow-up with PCP this week. Specific return precautions discussed. Time was given for all questions to be answered. The patient verbalized understanding and agreement with plan. The patient appears safe for discharge home.  Patient case discussed with Dr. Rush Landmark who is in agreement with plan.  Final Clinical Impressions(s) / ED Diagnoses   Final diagnoses:  Other constipation    ED Discharge Orders    None       Princella Pellegrini 01/21/18 1028    Tegeler, Canary Brim, MD 01/21/18 [phone]

## 2018-01-21 NOTE — Discharge Instructions (Addendum)
Please read and follow all provided instructions  Your diagnoses today includes: Constipation  Tests performed today include: X-ray of your abdomen that shows a large amount of stool in your abdomen consistent with constipation Vital signs. See below for your results today.   To help reduce constipation and promote bowel health, 1. Drink at least 64 ounces of water each day (increase water intake); 2. Eat plenty of fiber -i.e. fruits, vegetables, whole grains, legumes (increase fiber intake). If you are not able to eat foods high in fiber, you may use Benefiber or Metamucil over-the-counter. 3. Get plenty of physical activity Please continue your MiraLAX as prescribed.  Please fill your lactulose and take as prescribed. Please note that some of these medications may cause you to have abdominal cramping which is normal. If you develop severe abdominal pain, fever, vomiting, distention of your abdomen, unable to have a bowel movement for 5 days or are not passing gas, please return to the hospital.  Return instructions:  Please return to the Emergency Department if you experience worsening symptoms.  Please return if you have worsening abdominal pain, swelling of your abdomen, persistent vomiting, blood in your stool or vomit, or fever.  Please return if you have any other emergent concerns. Additional Information:  Your vital signs today were: BP (!) 163/73 (BP Location: Left Arm)    Pulse 88    Temp 98.5 F (36.9 C) (Oral)    Resp 14    Ht 6' (1.829 m)    Wt 81.6 kg (180 lb)    SpO2 99%    BMI 24.41 kg/m  If your blood pressure (BP) was elevated above 135/85 this visit, please have this repeated by your doctor within one month. ---------------

## 2018-01-21 NOTE — ED Triage Notes (Signed)
Patient complains of constipation, last bowel movement four days ago. Patient state she took a laxative yesterday without success so she came to be evaluated. Denies any pain. Discharged yesterday after being admitted for "disorientation". Alert and oriented and in no apparent distress at this time.

## 2019-09-04 ENCOUNTER — Inpatient Hospital Stay (HOSPITAL_COMMUNITY)
Admission: EM | Admit: 2019-09-04 | Discharge: 2019-09-14 | DRG: 417 | Disposition: A | Discharge status: Home Health | Payer: Medicare Other | Attending: Surgery | Admitting: Surgery

## 2019-09-04 ENCOUNTER — Encounter (HOSPITAL_COMMUNITY): Payer: Self-pay | Admitting: Emergency Medicine

## 2019-09-04 ENCOUNTER — Emergency Department (HOSPITAL_COMMUNITY): Payer: Medicare Other

## 2019-09-04 ENCOUNTER — Other Ambulatory Visit: Payer: Self-pay

## 2019-09-04 DIAGNOSIS — Z880 Allergy status to penicillin: Secondary | ICD-10-CM

## 2019-09-04 DIAGNOSIS — Z6824 Body mass index (BMI) 24.0-24.9, adult: Secondary | ICD-10-CM

## 2019-09-04 DIAGNOSIS — K838 Other specified diseases of biliary tract: Secondary | ICD-10-CM | POA: Diagnosis present

## 2019-09-04 DIAGNOSIS — K810 Acute cholecystitis: Secondary | ICD-10-CM | POA: Diagnosis not present

## 2019-09-04 DIAGNOSIS — Z20828 Contact with and (suspected) exposure to other viral communicable diseases: Secondary | ICD-10-CM | POA: Diagnosis present

## 2019-09-04 DIAGNOSIS — R109 Unspecified abdominal pain: Secondary | ICD-10-CM

## 2019-09-04 DIAGNOSIS — R188 Other ascites: Secondary | ICD-10-CM

## 2019-09-04 DIAGNOSIS — K8000 Calculus of gallbladder with acute cholecystitis without obstruction: Secondary | ICD-10-CM | POA: Diagnosis not present

## 2019-09-04 DIAGNOSIS — L0291 Cutaneous abscess, unspecified: Secondary | ICD-10-CM

## 2019-09-04 DIAGNOSIS — E785 Hyperlipidemia, unspecified: Secondary | ICD-10-CM | POA: Diagnosis present

## 2019-09-04 DIAGNOSIS — I10 Essential (primary) hypertension: Secondary | ICD-10-CM | POA: Diagnosis present

## 2019-09-04 DIAGNOSIS — Z79899 Other long term (current) drug therapy: Secondary | ICD-10-CM

## 2019-09-04 DIAGNOSIS — F17220 Nicotine dependence, chewing tobacco, uncomplicated: Secondary | ICD-10-CM | POA: Diagnosis present

## 2019-09-04 DIAGNOSIS — K8050 Calculus of bile duct without cholangitis or cholecystitis without obstruction: Secondary | ICD-10-CM

## 2019-09-04 DIAGNOSIS — K828 Other specified diseases of gallbladder: Secondary | ICD-10-CM | POA: Diagnosis present

## 2019-09-04 DIAGNOSIS — R1031 Right lower quadrant pain: Secondary | ICD-10-CM

## 2019-09-04 DIAGNOSIS — K819 Cholecystitis, unspecified: Secondary | ICD-10-CM | POA: Diagnosis present

## 2019-09-04 DIAGNOSIS — Z419 Encounter for procedure for purposes other than remedying health state, unspecified: Secondary | ICD-10-CM

## 2019-09-04 DIAGNOSIS — E440 Moderate protein-calorie malnutrition: Secondary | ICD-10-CM | POA: Insufficient documentation

## 2019-09-04 DIAGNOSIS — K651 Peritoneal abscess: Secondary | ICD-10-CM | POA: Diagnosis not present

## 2019-09-04 DIAGNOSIS — K660 Peritoneal adhesions (postprocedural) (postinfection): Secondary | ICD-10-CM | POA: Diagnosis present

## 2019-09-04 LAB — LACTIC ACID, PLASMA
Lactic Acid, Venous: 1.2 mmol/L (ref 0.5–1.9)
Lactic Acid, Venous: 1.5 mmol/L (ref 0.5–1.9)

## 2019-09-04 LAB — URINALYSIS, ROUTINE W REFLEX MICROSCOPIC
Bacteria, UA: NONE SEEN
Bilirubin Urine: NEGATIVE
Glucose, UA: NEGATIVE mg/dL
Ketones, ur: NEGATIVE mg/dL
Nitrite: NEGATIVE
Protein, ur: 100 mg/dL — AB
Specific Gravity, Urine: 1.035 — ABNORMAL HIGH (ref 1.005–1.030)
pH: 8 (ref 5.0–8.0)

## 2019-09-04 LAB — CBC
HCT: 39.3 % (ref 36.0–46.0)
Hemoglobin: 12.9 g/dL (ref 12.0–15.0)
MCH: 32.7 pg (ref 26.0–34.0)
MCHC: 32.8 g/dL (ref 30.0–36.0)
MCV: 99.7 fL (ref 80.0–100.0)
Platelets: 179 10*3/uL (ref 150–400)
RBC: 3.94 MIL/uL (ref 3.87–5.11)
RDW: 14.2 % (ref 11.5–15.5)
WBC: 14.2 10*3/uL — ABNORMAL HIGH (ref 4.0–10.5)
nRBC: 0 % (ref 0.0–0.2)

## 2019-09-04 LAB — COMPREHENSIVE METABOLIC PANEL
ALT: 168 U/L — ABNORMAL HIGH (ref 0–44)
AST: 201 U/L — ABNORMAL HIGH (ref 15–41)
Albumin: 3.3 g/dL — ABNORMAL LOW (ref 3.5–5.0)
Alkaline Phosphatase: 208 U/L — ABNORMAL HIGH (ref 38–126)
Anion gap: 15 (ref 5–15)
BUN: 11 mg/dL (ref 8–23)
CO2: 25 mmol/L (ref 22–32)
Calcium: 9.7 mg/dL (ref 8.9–10.3)
Chloride: 98 mmol/L (ref 98–111)
Creatinine, Ser: 0.99 mg/dL (ref 0.44–1.00)
GFR calc Af Amer: >60 mL/min (ref >60)
GFR calc non Af Amer: 55 mL/min — ABNORMAL LOW (ref >60)
Glucose, Bld: 124 mg/dL — ABNORMAL HIGH (ref 70–99)
Potassium: 2.8 mmol/L — ABNORMAL LOW (ref 3.5–5.1)
Sodium: 138 mmol/L (ref 135–145)
Total Bilirubin: 1.4 mg/dL — ABNORMAL HIGH (ref 0.3–1.2)
Total Protein: 7.3 g/dL (ref 6.5–8.1)

## 2019-09-04 LAB — SURGICAL PCR SCREEN
MRSA, PCR: NEGATIVE
Staphylococcus aureus: NEGATIVE

## 2019-09-04 LAB — LIPASE, BLOOD: Lipase: 20 U/L (ref 11–51)

## 2019-09-04 LAB — SARS CORONAVIRUS 2 (TAT 6-24 HRS): SARS Coronavirus 2: NEGATIVE

## 2019-09-04 MED ORDER — ACETAMINOPHEN 325 MG PO TABS
650.0000 mg | ORAL_TABLET | Freq: Four times a day (QID) | ORAL | Status: DC | PRN
  Administered 2019-09-05 – 2019-09-14 (×9): 650 mg via ORAL
  Filled 2019-09-04 (×11): qty 2

## 2019-09-04 MED ORDER — CIPROFLOXACIN IN D5W 400 MG/200ML IV SOLN
400.0000 mg | Freq: Two times a day (BID) | INTRAVENOUS | Status: DC
  Administered 2019-09-04 – 2019-09-05 (×2): 400 mg via INTRAVENOUS
  Filled 2019-09-04 (×3): qty 200

## 2019-09-04 MED ORDER — CHLORHEXIDINE GLUCONATE 0.12 % MT SOLN
15.0000 mL | Freq: Two times a day (BID) | OROMUCOSAL | Status: DC
  Administered 2019-09-04 – 2019-09-14 (×12): 15 mL via OROMUCOSAL
  Filled 2019-09-04 (×14): qty 15

## 2019-09-04 MED ORDER — OXYCODONE HCL 5 MG PO TABS
5.0000 mg | ORAL_TABLET | ORAL | Status: DC | PRN
  Administered 2019-09-04 – 2019-09-14 (×47): 10 mg via ORAL
  Filled 2019-09-04 (×49): qty 2

## 2019-09-04 MED ORDER — HYDROMORPHONE HCL 1 MG/ML IJ SOLN
1.0000 mg | INTRAMUSCULAR | Status: DC | PRN
  Administered 2019-09-04: 1 mg via INTRAVENOUS
  Filled 2019-09-04: qty 1

## 2019-09-04 MED ORDER — ONDANSETRON 4 MG PO TBDP: 4.0000 mg | ORAL_TABLET | Freq: Four times a day (QID) | ORAL | Status: DC | PRN

## 2019-09-04 MED ORDER — DIPHENHYDRAMINE HCL 50 MG/ML IJ SOLN: 12.5000 mg | Freq: Four times a day (QID) | INTRAMUSCULAR | Status: DC | PRN

## 2019-09-04 MED ORDER — IOHEXOL 300 MG/ML  SOLN
100.0000 mL | Freq: Once | INTRAMUSCULAR | Status: AC | PRN
  Administered 2019-09-04: 100 mL via INTRAVENOUS

## 2019-09-04 MED ORDER — METOPROLOL TARTRATE 5 MG/5ML IV SOLN: 5.0000 mg | Freq: Four times a day (QID) | INTRAVENOUS | Status: DC | PRN

## 2019-09-04 MED ORDER — MORPHINE SULFATE (PF) 4 MG/ML IV SOLN
4.0000 mg | Freq: Once | INTRAVENOUS | Status: AC
  Administered 2019-09-04: 4 mg via INTRAVENOUS
  Filled 2019-09-04: qty 1

## 2019-09-04 MED ORDER — FENTANYL CITRATE (PF) 100 MCG/2ML IJ SOLN
50.0000 ug | Freq: Once | INTRAMUSCULAR | Status: AC
  Administered 2019-09-04: 50 ug via INTRAVENOUS

## 2019-09-04 MED ORDER — DIPHENHYDRAMINE HCL 12.5 MG/5ML PO ELIX: 12.5000 mg | ORAL_SOLUTION | Freq: Four times a day (QID) | ORAL | Status: DC | PRN

## 2019-09-04 MED ORDER — ORAL CARE MOUTH RINSE
15.0000 mL | Freq: Two times a day (BID) | OROMUCOSAL | Status: DC
  Administered 2019-09-05 – 2019-09-14 (×17): 15 mL via OROMUCOSAL

## 2019-09-04 MED ORDER — CIPROFLOXACIN IN D5W 400 MG/200ML IV SOLN
400.0000 mg | Freq: Once | INTRAVENOUS | Status: AC
  Administered 2019-09-04: 400 mg via INTRAVENOUS
  Filled 2019-09-04: qty 200

## 2019-09-04 MED ORDER — ONDANSETRON HCL 4 MG/2ML IJ SOLN
4.0000 mg | Freq: Four times a day (QID) | INTRAMUSCULAR | Status: DC | PRN
  Administered 2019-09-04 – 2019-09-07 (×3): 4 mg via INTRAVENOUS
  Filled 2019-09-04 (×3): qty 2

## 2019-09-04 MED ORDER — ACETAMINOPHEN 650 MG RE SUPP: 650.0000 mg | Freq: Four times a day (QID) | RECTAL | Status: DC | PRN

## 2019-09-04 MED ORDER — METRONIDAZOLE IN NACL 5-0.79 MG/ML-% IV SOLN
500.0000 mg | Freq: Once | INTRAVENOUS | Status: AC
  Administered 2019-09-04: 500 mg via INTRAVENOUS
  Filled 2019-09-04: qty 100

## 2019-09-04 MED ORDER — KCL IN DEXTROSE-NACL 20-5-0.45 MEQ/L-%-% IV SOLN
INTRAVENOUS | Status: DC
  Administered 2019-09-04 – 2019-09-07 (×3): via INTRAVENOUS
  Administered 2019-09-09: 12:00:00 1 mL via INTRAVENOUS
  Administered 2019-09-10 – 2019-09-12 (×4): via INTRAVENOUS
  Filled 2019-09-04 (×12): qty 1000

## 2019-09-04 MED ORDER — SODIUM CHLORIDE 0.9% FLUSH: 3.0000 mL | Freq: Once | INTRAVENOUS | Status: DC

## 2019-09-04 MED ORDER — FENTANYL CITRATE (PF) 100 MCG/2ML IJ SOLN
50.0000 ug | Freq: Once | INTRAMUSCULAR | Status: DC
  Filled 2019-09-04: qty 2

## 2019-09-04 MED ORDER — FENTANYL CITRATE (PF) 100 MCG/2ML IJ SOLN
50.0000 ug | Freq: Once | INTRAMUSCULAR | Status: AC
  Administered 2019-09-04: 50 ug via INTRAVENOUS
  Filled 2019-09-04: qty 2

## 2019-09-04 MED ORDER — MORPHINE SULFATE (PF) 4 MG/ML IV SOLN
4.0000 mg | INTRAVENOUS | Status: DC | PRN
  Administered 2019-09-04 (×2): 4 mg via INTRAVENOUS
  Filled 2019-09-04 (×2): qty 1

## 2019-09-04 NOTE — ED Provider Notes (Signed)
[facility] EMERGENCY DEPARTMENT Provider Note   CSN: [id number] Arrival date & time: 09/04/19  0909     History   Chief Complaint Chief Complaint  Patient presents with   Abdominal Pain    HPI Samantha Gonzales is a 78 y.o. female.     The history is provided by the patient and medical records. No language interpreter was used.  Abdominal Pain Pain location:  RLQ Pain quality: aching   Pain radiates to:  Does not radiate Pain severity:  Severe Onset quality:  Gradual Duration:  3 days Timing:  Constant Progression:  Worsening Chronicity:  New Relieved by:  Nothing Worsened by:  Palpation Ineffective treatments:  None tried Associated symptoms: anorexia   Associated symptoms: no chest pain, no chills, no constipation, no cough, no diarrhea, no dysuria, no fatigue, no fever, no nausea, no shortness of breath and no vomiting     Past Medical History:  Diagnosis Date   Hypertension     Patient Active Problem List   Diagnosis Date Noted   Altered mental status 01/18/2018   Altered mental state 01/17/2018    History reviewed. No pertinent surgical history.   OB History   No obstetric history on file.      Home Medications    Prior to Admission medications   Medication Sig Start Date End Date Taking? Authorizing Provider  amLODipine (NORVASC) 5 MG tablet Take 1 tablet (5 mg total) by mouth daily. 01/21/18   Joseph Art, DO  Cyanocobalamin (B-12) 1000 MCG SUBL Place 1,000 mcg under the tongue daily. 01/20/18   Joseph Art, DO  HYDROcodone-acetaminophen (NORCO) 7.5-325 MG tablet Take 1 tablet by mouth 3 (three) times daily as needed for pain. 12/24/17   [provider]  lactulose (CHRONULAC) 10 GM/15ML solution Take 30 mLs (20 g total) by mouth 2 (two) times daily as needed for mild constipation. For daily bowel movements 01/20/18   Marlin Canary U, DO  pantoprazole (PROTONIX) 40 MG tablet Take 1 tablet (40 mg total) by mouth 2  (two) times daily. 01/20/18   Joseph Art, DO  thiamine 100 MG tablet Take 1 tablet (100 mg total) by mouth daily. 01/21/18   Joseph Art, DO    Family History No family history on file.  Social History Social History   Tobacco Use   Smoking status: Never Smoker   Smokeless tobacco: Never Used  Substance Use Topics   Alcohol use: No   Drug use: No     Allergies   Penicillins   Review of Systems Review of Systems  Constitutional: Negative for chills, fatigue and fever.  HENT: Negative for congestion.   Respiratory: Negative for cough, chest tightness, shortness of breath and wheezing.   Cardiovascular: Negative for chest pain and palpitations.  Gastrointestinal: Positive for abdominal pain and anorexia. Negative for abdominal distention, constipation, diarrhea, nausea and vomiting.  Genitourinary: Negative for dysuria, flank pain and frequency.  Musculoskeletal: Negative for back pain, neck pain and neck stiffness.  Skin: Negative for rash and wound.  Neurological: Negative for light-headedness and headaches.  All other systems reviewed and are negative.    Physical Exam Updated Vital Signs BP (!) 167/70 (BP Location: Right Arm)    Pulse 88    Temp 100.1 F (37.8 C) (Oral)    Resp 18    SpO2 96%   Physical Exam Vitals signs and nursing note reviewed.  Constitutional:      General: She is  not in acute distress.    Appearance: She is well-developed. She is not ill-appearing, toxic-appearing or diaphoretic.  HENT:     Head: Normocephalic and atraumatic.     Right Ear: External ear normal.     Left Ear: External ear normal.     Nose: Nose normal.     Mouth/Throat:     Pharynx: No oropharyngeal exudate.  Eyes:     Conjunctiva/sclera: Conjunctivae normal.     Pupils: Pupils are equal, round, and reactive to light.  Neck:     Musculoskeletal: Normal range of motion and neck supple.  Cardiovascular:     Rate and Rhythm: Normal rate.     Heart sounds:  Normal heart sounds. No murmur.  Pulmonary:     Effort: Pulmonary effort is normal. No respiratory distress.     Breath sounds: No stridor. No wheezing, rhonchi or rales.  Chest:     Chest wall: No tenderness.  Abdominal:     General: Abdomen is flat. Bowel sounds are normal. There is no distension.     Tenderness: There is abdominal tenderness in the right lower quadrant. There is no right CVA tenderness, left CVA tenderness or rebound.    Skin:    General: Skin is warm.     Findings: No erythema or rash.  Neurological:     Mental Status: She is alert and oriented to person, place, and time.     Motor: No abnormal muscle tone.     Coordination: Coordination normal.     Deep Tendon Reflexes: Reflexes are normal and symmetric.      ED Treatments / Results  Labs (all labs ordered are listed, but only abnormal results are displayed) Labs Reviewed  COMPREHENSIVE METABOLIC PANEL - Abnormal; Notable for the following components:      Result Value   Potassium 2.8 (*)    Glucose, Bld 124 (*)    Albumin 3.3 (*)    AST 201 (*)    ALT 168 (*)    Alkaline Phosphatase 208 (*)    Total Bilirubin 1.4 (*)    GFR calc non Af Amer 55 (*)    All other components within normal limits  CBC - Abnormal; Notable for the following components:   WBC 14.2 (*)    All other components within normal limits  URINALYSIS, ROUTINE W REFLEX MICROSCOPIC - Abnormal; Notable for the following components:   Specific Gravity, Urine 1.035 (*)    Hgb urine dipstick SMALL (*)    Protein, ur 100 (*)    Leukocytes,Ua TRACE (*)    All other components within normal limits  URINE CULTURE  CULTURE, BLOOD (ROUTINE X 2)  CULTURE, BLOOD (ROUTINE X 2)  SARS CORONAVIRUS 2 (TAT 6-24 HRS)  LIPASE, BLOOD  LACTIC ACID, PLASMA  LACTIC ACID, PLASMA    EKG None  Radiology Ct Abdomen Pelvis W Contrast  Result Date: 09/04/2019 CLINICAL DATA:  Severe right lower quadrant abdominal pain. EXAM: CT ABDOMEN AND PELVIS  WITH CONTRAST TECHNIQUE: Multidetector CT imaging of the abdomen and pelvis was performed using the standard protocol following bolus administration of intravenous contrast. CONTRAST:  OMNIPAQUE IOHEXOL 300 MG/ML  SOLN COMPARISON:  CT abdomen pelvis 09/28/2015; CT abdomen pelvis 02/20/2011 FINDINGS: Lower chest: Normal heart size. Dependent atelectasis bilateral lower lobes. No pleural effusion. Hepatobiliary: Liver is normal in size and contour. Stable cyst within the central aspect of the liver. Persistent intrahepatic and extrahepatic biliary ductal dilatation. Common bile duct measures 9 mm distally (  image 39; series 3). The gallbladder is markedly distended measuring up to 18 cm in craniocaudal dimension. There is mild wall thickening of the mid aspect of the gallbladder. Sludge within the gallbladder lumen. Pancreas: Similar-appearing prominence of the pancreatic duct. No inflammatory change. Spleen: Unremarkable Adrenals/Urinary Tract: Normal adrenal glands. Bilateral renal cyst. Kidneys enhance symmetrically with contrast. No hydronephrosis. Urinary bladder is unremarkable. Stomach/Bowel: No abnormal bowel wall thickening or evidence for bowel obstruction. Normal appendix. No free fluid or free intraperitoneal air. Vascular/Lymphatic: Normal caliber abdominal aorta. Peripheral calcified atherosclerotic plaque. No retroperitoneal lymphadenopathy. Reproductive: Reflux of contrast into the left gonadal veins. Other: None. Musculoskeletal: Left hip joint degenerative changes. Lumbar spine degenerative changes. No aggressive or acute appearing osseous lesions. IMPRESSION: 1. The gallbladder is hydropic and markedly distended measuring up to 18 cm, extending caudally into the lower abdomen. There is some wall thickening of the mid aspect of the gallbladder. Findings raise the possibility of associated superimposed acute cholecystitis. 2. There is persistent intrahepatic and extrahepatic biliary ductal  dilatation which is similar when compared to CT 02/14/2011. 3. These results were called by telephone at the time of interpretation on 09/04/2019 at 11:11 am to provider [facility] Alesha Jaffee , who verbally acknowledged these results. Electronically Signed   By: Annia Beltrew  Davis M.D.   On: 09/04/2019 11:14    Procedures Procedures (including critical care time)  CRITICAL CARE Performed by: Canary Brimhristopher J Cesilia Shinn Total critical care time: 35 minutes Critical care time was exclusive of separately billable procedures and treating other patients. Critical care was necessary to treat or prevent imminent or life-threatening deterioration. Critical care was time spent personally by me on the following activities: development of treatment plan with patient and/or surrogate as well as nursing, discussions with consultants, evaluation of patient's response to treatment, examination of patient, obtaining history from patient or surrogate, ordering and performing treatments and interventions, ordering and review of laboratory studies, ordering and review of radiographic studies, pulse oximetry and re-evaluation of patient's condition.    Medications Ordered in ED Medications  sodium chloride flush (NS) 0.9 % injection 3 mL (0 mLs Intravenous Hold 09/04/19 0919)  ciprofloxacin (CIPRO) IVPB 400 mg (has no administration in time range)  acetaminophen (TYLENOL) tablet 650 mg (has no administration in time range)    Or  acetaminophen (TYLENOL) suppository 650 mg (has no administration in time range)  oxyCODONE (Oxy IR/ROXICODONE) immediate release tablet 5-10 mg (10 mg Oral Given 09/04/19 1344)  morphine 4 MG/ML injection 4 mg (has no administration in time range)  diphenhydrAMINE (BENADRYL) 12.5 MG/5ML elixir 12.5 mg (has no administration in time range)    Or  diphenhydrAMINE (BENADRYL) injection 12.5 mg (has no administration in time range)  ondansetron (ZOFRAN-ODT) disintegrating tablet 4 mg ( Oral See  Alternative 09/04/19 1344)    Or  ondansetron (ZOFRAN) injection 4 mg (4 mg Intravenous Given 09/04/19 1344)  metoprolol tartrate (LOPRESSOR) injection 5 mg (has no administration in time range)  fentaNYL (SUBLIMAZE) injection 50 mcg (50 mcg Intravenous Given 09/04/19 0952)  iohexol (OMNIPAQUE) 300 MG/ML solution 100 mL (100 mLs Intravenous Contrast Given 09/04/19 1008)  ciprofloxacin (CIPRO) IVPB 400 mg (400 mg Intravenous New Bag/Given 09/04/19 1333)    And  metroNIDAZOLE (FLAGYL) IVPB 500 mg (0 mg Intravenous Stopped 09/04/19 1334)  fentaNYL (SUBLIMAZE) injection 50 mcg (50 mcg Intravenous Given 09/04/19 1123)  morphine 4 MG/ML injection 4 mg (4 mg Intravenous Given 09/04/19 1230)     Initial Impression / Assessment and Plan / ED Course  I have reviewed the triage vital signs and the nursing notes.  Pertinent labs & imaging results that were available during my care of the patient were reviewed by me and considered in my medical decision making (see chart for details).        Samantha Gonzales is a 78 y.o. female with a past medical history significant for hypertension who presents with worsening right lower quadrant abdominal pain.  Patient reports that for the last 3 to 4 days she has had worsening pain in her right lower quadrant.  She reports that the pain is a 9 out of 10 in severity currently.  She is never had this pain before.  She denies recent trauma.  She denies nausea, vomiting, urinary symptoms, or GI symptoms.  Constipation or diarrhea and had a normal bowel movement yesterday.  She reports he still passing flatus.  She is concerned about her appendix given location of pain.  She denies history of kidney stones but reports the pain does radiate around towards the right flank.  She denies significant right back pain.  No rashes.  She denies fevers, chills, but does have decreased appetite.  On exam, patient does have extreme tenderness in her right lower quadrant and somewhat in the  right mid abdomen.  Other abdomen not significant tender.  Minimal tenderness in her right flank and no CVA tenderness.  Lungs clear and chest nontender.  Exam otherwise unremarkable.  I did hear bowel sounds.  Clinically I am concerned about an appendicitis, right kidney stone, right side diverticulitis, or other etiology of pain on the side.   Patient will get pain medicine and be n.p.o.  She will have screening labs and a CT scan to further evaluate.  Anticipate reassessment after work-up.  Labs are concerning for leukocytosis and LFT elevation.  CT scan returned showing evidence of acute cholecystitis but no abscess or perforation.  General surgery was called who will see and admit patient.  Patient was given antibiotics.  Patient required multiple pain medication doses will with her discomfort and will be admitted.  Patient admitted for further management.   Final Clinical Impressions(s) / ED Diagnoses   Final diagnoses:  Right lower quadrant abdominal pain  Acute cholecystitis     Clinical Impression: 1. Right lower quadrant abdominal pain   2. Acute cholecystitis     Disposition: Admit  This note was prepared with assistance of Dragon voice recognition software. Occasional wrong-word or sound-a-like substitutions may have occurred due to the inherent limitations of voice recognition software.      Ryun Velez, Gwenyth Allegra, MD 09/04/19 [phone]

## 2019-09-04 NOTE — ED Triage Notes (Addendum)
Pt here for eval of right sided abdominal pain for 3 days, pin point to RLQ. Guarding abdomen. Denies N/V/D/dysuria. Denies lumps/hernias.

## 2019-09-04 NOTE — Progress Notes (Signed)
Pt received from ED via stretcher.  Up to the bathroom with assistance of two.  C/o pain, will medicate.  Oriented to room and dept.  Explained to pt that she will be having her gall bladder taken out tomorrow.

## 2019-09-04 NOTE — Progress Notes (Signed)
Talked to patient's daughter (pt lives with her) and updated her on the plan of care, also helped pt talk to her on her phone.  Informed her that although surgery is not posted now, she will likely have her gallbladder taken out tomorrow.  Pt would like to talk to the surgeon in the am before signing her operative permit.

## 2019-09-04 NOTE — H&P (Signed)
Samantha Gonzales is an 78 y.o. female.   Chief Complaint: Right-sided abdominal pain HPI: 78 year old female with a history of hypertension presented to the emergency department complaining of right-sided abdominal pain.  No previous history of this.  Work-up revealed moderate elevation of liver function tests.  CT scan of the abdomen and pelvis was done showing a very enlarged gallbladder with some wall thickening.  There was also some intrahepatic and extrahepatic biliary ductal dilatation, however, that was seen on a previous study in 2012.  I was asked to see her for admission and management of her gallbladder disease.  Her daughter is present and assists with the history.  No recent illness or sick exposure.  Past Medical History:  Diagnosis Date  . Hypertension     Past surgical history, tubal ligation  No family history on file. Social History:  reports that she has never smoked. She has never used smokeless tobacco. She reports that she does not drink alcohol or use drugs.  Allergies:  Allergies  Allergen Reactions  . Penicillins Hives, Itching and Rash    Has patient had a PCN reaction causing immediate rash, facial/tongue/throat swelling, SOB or lightheadedness with hypotension: Yes Has patient had a PCN reaction causing severe rash involving mucus membranes or skin necrosis: yes Has patient had a PCN reaction that required hospitalization: unk Has patient had a PCN reaction occurring within the last 10 years: unk If all of the above answers are "NO", then may proceed with Cephalosporin use.     (Not in a hospital admission)   Results for orders placed or performed during the hospital encounter of 09/04/19 (from the past 48 hour(s))  Lipase, blood     Status: None   Collection Time: 09/04/19  9:22 AM  Result Value Ref Range   Lipase 20 11 - 51 U/L    Comment: Performed at [facility], [address], [place], [place] [address]  Comprehensive metabolic panel      Status: Abnormal   Collection Time: 09/04/19  9:22 AM  Result Value Ref Range   Sodium 138 135 - 145 mmol/L   Potassium 2.8 (L) 3.5 - 5.1 mmol/L   Chloride 98 98 - 111 mmol/L   CO2 25 22 - 32 mmol/L   Glucose, Bld 124 (H) 70 - 99 mg/dL   BUN 11 8 - 23 mg/dL   Creatinine, Ser 7.65 0.44 - 1.00 mg/dL   Calcium 9.7 8.9 - 46.5 mg/dL   Total Protein 7.3 6.5 - 8.1 g/dL   Albumin 3.3 (L) 3.5 - 5.0 g/dL   AST 035 (H) 15 - 41 U/L   ALT 168 (H) 0 - 44 U/L   Alkaline Phosphatase 208 (H) 38 - 126 U/L   Total Bilirubin 1.4 (H) 0.3 - 1.2 mg/dL   GFR calc non Af Amer 55 (L) >60 mL/min   GFR calc Af Amer >60 >60 mL/min   Anion gap 15 5 - 15    Comment: Performed at [facility], [address], [place], [place] [address]  CBC     Status: Abnormal   Collection Time: 09/04/19  9:22 AM  Result Value Ref Range   WBC 14.2 (H) 4.0 - 10.5 K/uL   RBC 3.94 3.87 - 5.11 MIL/uL   Hemoglobin 12.9 12.0 - 15.0 g/dL   HCT 12.7 51.7 - 00.1 %   MCV 99.7 80.0 - 100.0 fL   MCH 32.7 26.0 - 34.0 pg   MCHC 32.8  30.0 - 36.0 g/dL   RDW 24.414.2 01.011.5 - 27.215.5 %   Platelets 179 150 - 400 K/uL   nRBC 0.0 0.0 - 0.2 %    Comment: Performed at [facility], [address] [place], [place] [address]  Lactic acid, plasma     Status: None   Collection Time: 09/04/19  9:49 AM  Result Value Ref Range   Lactic Acid, Venous 1.2 0.5 - 1.9 mmol/L    Comment: Performed at [facility], [address] [place], [place] [address]  Urinalysis, Routine w reflex microscopic     Status: Abnormal   Collection Time: 09/04/19 11:10 AM  Result Value Ref Range   Color, Urine YELLOW YELLOW   APPearance CLEAR CLEAR   Specific Gravity, Urine 1.035 (H) 1.005 - 1.030   pH 8.0 5.0 - 8.0   Glucose, UA NEGATIVE NEGATIVE mg/dL   Hgb urine dipstick SMALL (A) NEGATIVE   Bilirubin Urine NEGATIVE NEGATIVE   Ketones, ur NEGATIVE NEGATIVE mg/dL   Protein, ur 425100 (A) NEGATIVE mg/dL   Nitrite NEGATIVE NEGATIVE   Leukocytes,Ua TRACE (A)  NEGATIVE   RBC / HPF 0-5 0 - 5 RBC/hpf   WBC, UA 6-10 0 - 5 WBC/hpf   Bacteria, UA NONE SEEN NONE SEEN   Squamous Epithelial / LPF 0-5 0 - 5    Comment: Performed at [facility], [address] [place], [place] [address]   Ct Abdomen Pelvis W Contrast  Result Date: 09/04/2019 CLINICAL DATA:  Severe right lower quadrant abdominal pain. EXAM: CT ABDOMEN AND PELVIS WITH CONTRAST TECHNIQUE: Multidetector CT imaging of the abdomen and pelvis was performed using the standard protocol following bolus administration of intravenous contrast. CONTRAST:  100mL OMNIPAQUE IOHEXOL 300 MG/ML  SOLN COMPARISON:  CT abdomen pelvis 09/28/2015; CT abdomen pelvis 02/20/2011 FINDINGS: Lower chest: Normal heart size. Dependent atelectasis bilateral lower lobes. No pleural effusion. Hepatobiliary: Liver is normal in size and contour. Stable cyst within the central aspect of the liver. Persistent intrahepatic and extrahepatic biliary ductal dilatation. Common bile duct measures 9 mm distally (image 39; series 3). The gallbladder is markedly distended measuring up to 18 cm in craniocaudal dimension. There is mild wall thickening of the mid aspect of the gallbladder. Sludge within the gallbladder lumen. Pancreas: Similar-appearing prominence of the pancreatic duct. No inflammatory change. Spleen: Unremarkable Adrenals/Urinary Tract: Normal adrenal glands. Bilateral renal cyst. Kidneys enhance symmetrically with contrast. No hydronephrosis. Urinary bladder is unremarkable. Stomach/Bowel: No abnormal bowel wall thickening or evidence for bowel obstruction. Normal appendix. No free fluid or free intraperitoneal air. Vascular/Lymphatic: Normal caliber abdominal aorta. Peripheral calcified atherosclerotic plaque. No retroperitoneal lymphadenopathy. Reproductive: Reflux of contrast into the left gonadal veins. Other: None. Musculoskeletal: Left hip joint degenerative changes. Lumbar spine degenerative changes. No aggressive or  acute appearing osseous lesions. IMPRESSION: 1. The gallbladder is hydropic and markedly distended measuring up to 18 cm, extending caudally into the lower abdomen. There is some wall thickening of the mid aspect of the gallbladder. Findings raise the possibility of associated superimposed acute cholecystitis. 2. There is persistent intrahepatic and extrahepatic biliary ductal dilatation which is similar when compared to CT 02/14/2011. 3. These results were called by telephone at the time of interpretation on 09/04/2019 at 11:11 am to provider [facility] , who verbally acknowledged these results. Electronically Signed   By: Annia Beltrew  Davis M.D.   On: 09/04/2019 11:14    Review of Systems  Constitutional: Negative for chills and fever.  HENT: Negative.   Eyes: Negative for blurred vision.  Respiratory: Negative for cough and shortness of breath.   Cardiovascular: Negative for chest pain.  Gastrointestinal: Positive for abdominal pain and nausea. Negative for constipation, diarrhea and vomiting.  Genitourinary: Negative.   Musculoskeletal: Negative.   Skin: Negative.   Neurological: Negative.   Endo/Heme/Allergies: Negative.   Psychiatric/Behavioral: Negative.     Blood pressure (!) 163/59, pulse 88, temperature 99.5 F (37.5 C), resp. rate 16, SpO2 99 %. Physical Exam  Constitutional: She is oriented to person, place, and time. She appears well-developed and well-nourished. No distress.  HENT:  Head: Normocephalic.  Right Ear: External ear normal.  Left Ear: External ear normal.  Mouth/Throat: Oropharynx is clear and moist.  Eyes: Pupils are equal, round, and reactive to light. EOM are normal.  No notable icterus  Neck: Neck supple. No tracheal deviation present. No thyromegaly present.  Cardiovascular: Normal rate, regular rhythm and normal heart sounds.  Respiratory: Effort normal and breath sounds normal. No respiratory distress. She has no wheezes. She has no rales.  GI: Soft.  She exhibits no distension. There is abdominal tenderness. There is no rebound and no guarding.  Quite tender on right side but no peritonitis, gallbladder is vaguely palpable  Musculoskeletal: Normal range of motion.        General: No edema.  Neurological: She is alert and oriented to person, place, and time.  Skin: Skin is warm.  Psychiatric: She has a normal mood and affect.     Assessment/Plan Cholecystitis with enlarged gallbladder -admit, IV Cipro in light of allergy, LFTs are moderately elevated so we will repeat those in the a.m.  I discussed laparoscopic cholecystectomy with possible cholangiogram with her and her daughter.  I discussed the procedure, risks, and benefits.  Dr. Brantley Stage will consider going ahead with that tomorrow pending her laboratory studies.  Covid test ordered.  Zenovia Jarred, MD 09/04/2019, 11:59 AM

## 2019-09-05 ENCOUNTER — Encounter (HOSPITAL_COMMUNITY): Payer: Self-pay | Admitting: Anesthesiology

## 2019-09-05 ENCOUNTER — Encounter (HOSPITAL_COMMUNITY): Admission: EM | Disposition: A | Discharge status: Home Health | Payer: Self-pay | Source: Home / Self Care

## 2019-09-05 ENCOUNTER — Observation Stay (HOSPITAL_COMMUNITY): Payer: Medicare Other | Admitting: Anesthesiology

## 2019-09-05 ENCOUNTER — Observation Stay (HOSPITAL_COMMUNITY): Payer: Medicare Other

## 2019-09-05 DIAGNOSIS — K8000 Calculus of gallbladder with acute cholecystitis without obstruction: Secondary | ICD-10-CM | POA: Diagnosis present

## 2019-09-05 DIAGNOSIS — Z79899 Other long term (current) drug therapy: Secondary | ICD-10-CM | POA: Diagnosis not present

## 2019-09-05 DIAGNOSIS — K660 Peritoneal adhesions (postprocedural) (postinfection): Secondary | ICD-10-CM | POA: Diagnosis present

## 2019-09-05 DIAGNOSIS — K810 Acute cholecystitis: Secondary | ICD-10-CM | POA: Diagnosis present

## 2019-09-05 DIAGNOSIS — Z6824 Body mass index (BMI) 24.0-24.9, adult: Secondary | ICD-10-CM | POA: Diagnosis not present

## 2019-09-05 DIAGNOSIS — K651 Peritoneal abscess: Secondary | ICD-10-CM | POA: Diagnosis not present

## 2019-09-05 DIAGNOSIS — K838 Other specified diseases of biliary tract: Secondary | ICD-10-CM | POA: Diagnosis present

## 2019-09-05 DIAGNOSIS — Z20828 Contact with and (suspected) exposure to other viral communicable diseases: Secondary | ICD-10-CM | POA: Diagnosis present

## 2019-09-05 DIAGNOSIS — K828 Other specified diseases of gallbladder: Secondary | ICD-10-CM | POA: Diagnosis present

## 2019-09-05 DIAGNOSIS — E785 Hyperlipidemia, unspecified: Secondary | ICD-10-CM | POA: Diagnosis present

## 2019-09-05 DIAGNOSIS — E440 Moderate protein-calorie malnutrition: Secondary | ICD-10-CM | POA: Diagnosis present

## 2019-09-05 DIAGNOSIS — Z880 Allergy status to penicillin: Secondary | ICD-10-CM | POA: Diagnosis not present

## 2019-09-05 DIAGNOSIS — F17220 Nicotine dependence, chewing tobacco, uncomplicated: Secondary | ICD-10-CM | POA: Diagnosis present

## 2019-09-05 DIAGNOSIS — I10 Essential (primary) hypertension: Secondary | ICD-10-CM | POA: Diagnosis present

## 2019-09-05 HISTORY — PX: CHOLECYSTECTOMY: SHX55

## 2019-09-05 LAB — COMPREHENSIVE METABOLIC PANEL
ALT: 171 U/L — ABNORMAL HIGH (ref 0–44)
AST: 204 U/L — ABNORMAL HIGH (ref 15–41)
Albumin: 2.7 g/dL — ABNORMAL LOW (ref 3.5–5.0)
Alkaline Phosphatase: 301 U/L — ABNORMAL HIGH (ref 38–126)
Anion gap: 11 (ref 5–15)
BUN: 9 mg/dL (ref 8–23)
CO2: 27 mmol/L (ref 22–32)
Calcium: 9.3 mg/dL (ref 8.9–10.3)
Chloride: 99 mmol/L (ref 98–111)
Creatinine, Ser: 1.15 mg/dL — ABNORMAL HIGH (ref 0.44–1.00)
GFR calc Af Amer: 54 mL/min — ABNORMAL LOW (ref >60)
GFR calc non Af Amer: 46 mL/min — ABNORMAL LOW (ref >60)
Glucose, Bld: 108 mg/dL — ABNORMAL HIGH (ref 70–99)
Potassium: 2.7 mmol/L — CL (ref 3.5–5.1)
Sodium: 137 mmol/L (ref 135–145)
Total Bilirubin: 1 mg/dL (ref 0.3–1.2)
Total Protein: 6.3 g/dL — ABNORMAL LOW (ref 6.5–8.1)

## 2019-09-05 LAB — CBC
HCT: 34.2 % — ABNORMAL LOW (ref 36.0–46.0)
Hemoglobin: 11.4 g/dL — ABNORMAL LOW (ref 12.0–15.0)
MCH: 33.2 pg (ref 26.0–34.0)
MCHC: 33.3 g/dL (ref 30.0–36.0)
MCV: 99.7 fL (ref 80.0–100.0)
Platelets: 147 10*3/uL — ABNORMAL LOW (ref 150–400)
RBC: 3.43 MIL/uL — ABNORMAL LOW (ref 3.87–5.11)
RDW: 14 % (ref 11.5–15.5)
WBC: 10.4 10*3/uL (ref 4.0–10.5)
nRBC: 0 % (ref 0.0–0.2)

## 2019-09-05 LAB — URINE CULTURE: Culture: <10000 — AB

## 2019-09-05 SURGERY — LAPAROSCOPIC CHOLECYSTECTOMY WITH INTRAOPERATIVE CHOLANGIOGRAM
Anesthesia: General | Site: Abdomen

## 2019-09-05 MED ORDER — SUGAMMADEX SODIUM 200 MG/2ML IV SOLN
INTRAVENOUS | Status: DC | PRN
  Administered 2019-09-05: 150 mg via INTRAVENOUS

## 2019-09-05 MED ORDER — HYDROMORPHONE HCL 1 MG/ML IJ SOLN
INTRAMUSCULAR | Status: AC
  Filled 2019-09-05: qty 1

## 2019-09-05 MED ORDER — MIDAZOLAM HCL 2 MG/2ML IJ SOLN
INTRAMUSCULAR | Status: AC
  Filled 2019-09-05: qty 2

## 2019-09-05 MED ORDER — PROPOFOL 10 MG/ML IV BOLUS
INTRAVENOUS | Status: AC
  Filled 2019-09-05: qty 20

## 2019-09-05 MED ORDER — 0.9 % SODIUM CHLORIDE (POUR BTL) OPTIME
TOPICAL | Status: DC | PRN
  Administered 2019-09-05: 10:00:00 1000 mL

## 2019-09-05 MED ORDER — BUPIVACAINE-EPINEPHRINE (PF) 0.5% -1:200000 IJ SOLN
INTRAMUSCULAR | Status: DC | PRN
  Administered 2019-09-05: 12 mL

## 2019-09-05 MED ORDER — BOOST / RESOURCE BREEZE PO LIQD CUSTOM
1.0000 | Freq: Three times a day (TID) | ORAL | Status: DC
  Administered 2019-09-06 – 2019-09-09 (×4): 1 via ORAL

## 2019-09-05 MED ORDER — SODIUM CHLORIDE 0.9 % IR SOLN
Status: DC | PRN
  Administered 2019-09-05: 3000 mL

## 2019-09-05 MED ORDER — POTASSIUM CHLORIDE CRYS ER 20 MEQ PO TBCR
40.0000 meq | EXTENDED_RELEASE_TABLET | Freq: Once | ORAL | Status: AC
  Administered 2019-09-05: 40 meq via ORAL
  Filled 2019-09-05: qty 2

## 2019-09-05 MED ORDER — FENTANYL CITRATE (PF) 100 MCG/2ML IJ SOLN
INTRAMUSCULAR | Status: DC | PRN
  Administered 2019-09-05: 25 ug via INTRAVENOUS
  Administered 2019-09-05: 50 ug via INTRAVENOUS
  Administered 2019-09-05: 25 ug via INTRAVENOUS

## 2019-09-05 MED ORDER — PROPOFOL 10 MG/ML IV BOLUS
INTRAVENOUS | Status: DC | PRN
  Administered 2019-09-05: 40 mg via INTRAVENOUS
  Administered 2019-09-05: 100 mg via INTRAVENOUS

## 2019-09-05 MED ORDER — CIPROFLOXACIN IN D5W 400 MG/200ML IV SOLN
400.0000 mg | Freq: Two times a day (BID) | INTRAVENOUS | Status: AC
  Administered 2019-09-05 – 2019-09-12 (×13): 400 mg via INTRAVENOUS
  Filled 2019-09-05 (×16): qty 200

## 2019-09-05 MED ORDER — FENTANYL CITRATE (PF) 250 MCG/5ML IJ SOLN
INTRAMUSCULAR | Status: AC
  Filled 2019-09-05: qty 5

## 2019-09-05 MED ORDER — ROCURONIUM BROMIDE 10 MG/ML (PF) SYRINGE
PREFILLED_SYRINGE | INTRAVENOUS | Status: AC
  Filled 2019-09-05: qty 10

## 2019-09-05 MED ORDER — LACTATED RINGERS IV SOLN
INTRAVENOUS | Status: DC | PRN
  Administered 2019-09-05 (×2): via INTRAVENOUS

## 2019-09-05 MED ORDER — POTASSIUM CHLORIDE 10 MEQ/100ML IV SOLN
INTRAVENOUS | Status: AC
  Filled 2019-09-05: qty 100

## 2019-09-05 MED ORDER — LACTATED RINGERS IV SOLN
Freq: Once | INTRAVENOUS | Status: AC
  Administered 2019-09-05: 10:00:00 via INTRAVENOUS

## 2019-09-05 MED ORDER — ONDANSETRON HCL 4 MG/2ML IJ SOLN
INTRAMUSCULAR | Status: DC | PRN
  Administered 2019-09-05: 4 mg via INTRAVENOUS

## 2019-09-05 MED ORDER — ONDANSETRON HCL 4 MG/2ML IJ SOLN
INTRAMUSCULAR | Status: AC
  Filled 2019-09-05: qty 2

## 2019-09-05 MED ORDER — LIDOCAINE 2% (20 MG/ML) 5 ML SYRINGE
INTRAMUSCULAR | Status: AC
  Filled 2019-09-05: qty 5

## 2019-09-05 MED ORDER — HYDROMORPHONE HCL 1 MG/ML IJ SOLN
0.5000 mg | INTRAMUSCULAR | Status: DC | PRN
  Administered 2019-09-05 (×2): 0.5 mg via INTRAVENOUS

## 2019-09-05 MED ORDER — POTASSIUM CHLORIDE 10 MEQ/100ML IV SOLN
10.0000 meq | INTRAVENOUS | Status: AC
  Administered 2019-09-05 (×4): 10 meq via INTRAVENOUS
  Filled 2019-09-05: qty 100

## 2019-09-05 MED ORDER — ROCURONIUM BROMIDE 50 MG/5ML IV SOSY
PREFILLED_SYRINGE | INTRAVENOUS | Status: DC | PRN
  Administered 2019-09-05: 40 mg via INTRAVENOUS

## 2019-09-05 MED ORDER — DEXAMETHASONE SODIUM PHOSPHATE 10 MG/ML IJ SOLN
INTRAMUSCULAR | Status: AC
  Filled 2019-09-05: qty 1

## 2019-09-05 MED ORDER — BUPIVACAINE-EPINEPHRINE 0.5% -1:200000 IJ SOLN
INTRAMUSCULAR | Status: AC
  Filled 2019-09-05: qty 1

## 2019-09-05 MED ORDER — DEXAMETHASONE SODIUM PHOSPHATE 10 MG/ML IJ SOLN
INTRAMUSCULAR | Status: DC | PRN
  Administered 2019-09-05: 5 mg via INTRAVENOUS

## 2019-09-05 MED ORDER — SODIUM CHLORIDE 0.9 % IV SOLN
INTRAVENOUS | Status: DC | PRN
  Administered 2019-09-05: 11:00:00 40 mL

## 2019-09-05 MED ORDER — LIDOCAINE 2% (20 MG/ML) 5 ML SYRINGE
INTRAMUSCULAR | Status: DC | PRN
  Administered 2019-09-05: 100 mg via INTRAVENOUS

## 2019-09-05 SURGICAL SUPPLY — 41 items
APPLIER CLIP ROT 10 11.4 M/L (STAPLE) ×4
BLADE CLIPPER SURG (BLADE) IMPLANT
CANISTER SUCT 3000ML PPV (MISCELLANEOUS) ×2 IMPLANT
CHLORAPREP W/TINT 26 (MISCELLANEOUS) ×2 IMPLANT
CLIP APPLIE ROT 10 11.4 M/L (STAPLE) ×2 IMPLANT
COVER MAYO STAND STRL (DRAPES) ×2 IMPLANT
COVER SURGICAL LIGHT HANDLE (MISCELLANEOUS) ×2 IMPLANT
COVER WAND RF STERILE (DRAPES) IMPLANT
DERMABOND ADVANCED (GAUZE/BANDAGES/DRESSINGS) ×1
DERMABOND ADVANCED .7 DNX12 (GAUZE/BANDAGES/DRESSINGS) ×1 IMPLANT
DRAPE C-ARM 42X120 X-RAY (DRAPES) ×2 IMPLANT
DRAPE WARM FLUID 44X44 (DRAPES) IMPLANT
ELECT REM PT RETURN 9FT ADLT (ELECTROSURGICAL) ×2
ELECTRODE REM PT RTRN 9FT ADLT (ELECTROSURGICAL) ×1 IMPLANT
GLOVE BIO SURGEON STRL SZ8 (GLOVE) ×2 IMPLANT
GLOVE BIOGEL PI IND STRL 8 (GLOVE) ×1 IMPLANT
GLOVE BIOGEL PI INDICATOR 8 (GLOVE) ×1
GOWN STRL REUS W/ TWL LRG LVL3 (GOWN DISPOSABLE) ×3 IMPLANT
GOWN STRL REUS W/ TWL XL LVL3 (GOWN DISPOSABLE) ×1 IMPLANT
GOWN STRL REUS W/TWL LRG LVL3 (GOWN DISPOSABLE) ×3
GOWN STRL REUS W/TWL XL LVL3 (GOWN DISPOSABLE) ×1
KIT BASIN OR (CUSTOM PROCEDURE TRAY) ×2 IMPLANT
KIT TURNOVER KIT B (KITS) ×2 IMPLANT
NS IRRIG 1000ML POUR BTL (IV SOLUTION) ×2 IMPLANT
PAD ARMBOARD 7.5X6 YLW CONV (MISCELLANEOUS) ×2 IMPLANT
POUCH RETRIEVAL ECOSAC 10 (ENDOMECHANICALS) ×1 IMPLANT
POUCH RETRIEVAL ECOSAC 10MM (ENDOMECHANICALS) ×1
SCISSORS LAP 5X35 DISP (ENDOMECHANICALS) ×2 IMPLANT
SET CHOLANGIOGRAPH 5 50 .035 (SET/KITS/TRAYS/PACK) ×2 IMPLANT
SET IRRIG TUBING LAPAROSCOPIC (IRRIGATION / IRRIGATOR) ×2 IMPLANT
SET TUBE SMOKE EVAC HIGH FLOW (TUBING) ×2 IMPLANT
SLEEVE ENDOPATH XCEL 5M (ENDOMECHANICALS) ×2 IMPLANT
SPECIMEN JAR SMALL (MISCELLANEOUS) ×2 IMPLANT
SUT MNCRL AB 4-0 PS2 18 (SUTURE) ×2 IMPLANT
TOWEL GREEN STERILE (TOWEL DISPOSABLE) ×2 IMPLANT
TOWEL GREEN STERILE FF (TOWEL DISPOSABLE) ×2 IMPLANT
TRAY LAPAROSCOPIC MC (CUSTOM PROCEDURE TRAY) ×2 IMPLANT
TROCAR XCEL BLUNT TIP 100MML (ENDOMECHANICALS) ×2 IMPLANT
TROCAR XCEL NON-BLD 11X100MML (ENDOMECHANICALS) ×2 IMPLANT
TROCAR XCEL NON-BLD 5MMX100MML (ENDOMECHANICALS) ×2 IMPLANT
WATER STERILE IRR 1000ML POUR (IV SOLUTION) ×2 IMPLANT

## 2019-09-05 NOTE — Anesthesia Preprocedure Evaluation (Signed)
Anesthesia Evaluation  Patient identified by MRN, date of birth, ID band Patient awake    Reviewed: Allergy & Precautions, H&P , NPO status , Patient's Chart, lab work & pertinent test results, reviewed documented beta blocker date and time   Airway Mallampati: II  TM Distance: >3 FB Neck ROM: full    Dental no notable dental hx.    Pulmonary neg pulmonary ROS,    Pulmonary exam normal breath sounds clear to auscultation       Cardiovascular Exercise Tolerance: Good hypertension, Pt. on medications negative cardio ROS   Rhythm:regular Rate:Normal     Neuro/Psych negative neurological ROS  negative psych ROS   GI/Hepatic Neg liver ROS, GERD  Medicated,  Endo/Other  negative endocrine ROS  Renal/GU negative Renal ROS  negative genitourinary   Musculoskeletal   Abdominal   Peds  Hematology negative hematology ROS (+)   Anesthesia Other Findings   Reproductive/Obstetrics negative OB ROS                             Anesthesia Physical Anesthesia Plan  ASA: III  Anesthesia Plan: General   Post-op Pain Management:    Induction: Intravenous and Cricoid pressure planned  PONV Risk Score and Plan: 3 and Ondansetron and Treatment may vary due to age or medical condition  Airway Management Planned: Oral ETT  Additional Equipment:   Intra-op Plan:   Post-operative Plan:   Informed Consent: I have reviewed the patients History and Physical, chart, labs and discussed the procedure including the risks, benefits and alternatives for the proposed anesthesia with the patient or authorized representative who has indicated his/her understanding and acceptance.     Dental Advisory Given  Plan Discussed with: CRNA, Anesthesiologist and Surgeon  Anesthesia Plan Comments:         Anesthesia Quick Evaluation

## 2019-09-05 NOTE — Progress Notes (Signed)
CRITICAL VALUE ALERT  Critical Value: potassium 2.7  Date & Time Notied:  09/05/2019 3086   Provider Notified: Georganna Skeans  Orders Received/Actions taken: awaiting

## 2019-09-05 NOTE — Anesthesia Procedure Notes (Addendum)
Procedure Name: Intubation Date/Time: 09/05/2019 10:17 AM Performed by: Scheryl Darter, CRNA Pre-anesthesia Checklist: Patient identified, Emergency Drugs available, Suction available and Patient being monitored Patient Re-evaluated:Patient Re-evaluated prior to induction Oxygen Delivery Method: Circle System Utilized Preoxygenation: Pre-oxygenation with 100% oxygen Induction Type: IV induction Ventilation: Mask ventilation without difficulty Laryngoscope Size: Mac and 3 Grade View: Grade II Tube type: Oral Tube size: 7.5 mm Number of attempts: 1 Airway Equipment and Method: Stylet and Oral airway Placement Confirmation: ETT inserted through vocal cords under direct vision,  positive ETCO2 and breath sounds checked- equal and bilateral Secured at: 22 cm Tube secured with: Tape Dental Injury: Teeth and Oropharynx as per pre-operative assessment

## 2019-09-05 NOTE — Anesthesia Postprocedure Evaluation (Signed)
Anesthesia Post Note  Patient: Samantha Gonzales  Procedure(s) Performed: LAPAROSCOPIC CHOLECYSTECTOMY WITH INTRAOPERATIVE CHOLANGIOGRAM (N/A Abdomen)     Patient location during evaluation: PACU Anesthesia Type: General Level of consciousness: awake and alert Pain management: pain level controlled Vital Signs Assessment: post-procedure vital signs reviewed and stable Respiratory status: spontaneous breathing, nonlabored ventilation, respiratory function stable and patient connected to nasal cannula oxygen Cardiovascular status: blood pressure returned to baseline and stable Postop Assessment: no apparent nausea or vomiting Anesthetic complications: no    Last Vitals:  Vitals:   09/05/19 1417 09/05/19 2120  BP: (!) 143/57 (!) 140/56  Pulse: 91 88  Resp:  18  Temp: 37 C 37.1 C  SpO2: 100% 100%    Last Pain:  Vitals:   09/05/19 2120  TempSrc: Oral  PainSc:    Pain Goal: Patients Stated Pain Goal: 0 (09/04/19 1945)                 Kennidy Lamke     

## 2019-09-05 NOTE — Discharge Instructions (Signed)
[facility] ° °Please arrive at least 30 min before your appointment to complete your check in paperwork.  If you are unable to arrive 30 min prior to your appointment time we may have to cancel or reschedule you. °LAPAROSCOPIC SURGERY: POST OP INSTRUCTIONS °Always review your discharge instruction sheet given to you by the facility where your surgery was performed. °IF YOU HAVE DISABILITY OR FAMILY LEAVE FORMS, YOU MUST BRING THEM TO THE OFFICE FOR PROCESSING.   °DO NOT GIVE THEM TO YOUR DOCTOR. ° °PAIN CONTROL ° °1. First take acetaminophen (Tylenol) AND/or ibuprofen (Advil) to control your pain after surgery.  Follow directions on package.  Taking acetaminophen (Tylenol) and/or ibuprofen (Advil) regularly after surgery will help to control your pain and lower the amount of prescription pain medication you may need.  You should not take more than 4,000 mg (4 grams) of acetaminophen (Tylenol) in 24 hours.  You should not take ibuprofen (Advil), aleve, motrin, naprosyn or other NSAIDS if you have a history of stomach ulcers or chronic kidney disease.  °2. A prescription for pain medication may be given to you upon discharge.  Take your pain medication as prescribed, if you still have uncontrolled pain after taking acetaminophen (Tylenol) or ibuprofen (Advil). °3. Use ice packs to help control pain. °4. If you need a refill on your pain medication, please contact your pharmacy.  They will contact our office to request authorization. Prescriptions will not be filled after 5pm or on week-ends. ° °HOME MEDICATIONS °5. Take your usually prescribed medications unless otherwise directed. ° °DIET °6. You should follow a light diet the first few days after arrival home.  Be sure to include lots of fluids daily. Avoid fatty, fried foods.  ° °CONSTIPATION °7. It is common to experience some constipation after surgery and if you are taking pain medication.  Increasing fluid intake and taking a stool  softener (such as Colace) will usually help or prevent this problem from occurring.  A mild laxative (Milk of Magnesia or Miralax) should be taken according to package instructions if there are no bowel movements after 48 hours. ° °WOUND/INCISION CARE °8. Most patients will experience some swelling and bruising in the area of the incisions.  Ice packs will help.  Swelling and bruising can take several days to resolve.  °9. Unless discharge instructions indicate otherwise, follow guidelines below  °a. STERI-STRIPS - you may remove your outer bandages 48 hours after surgery, and you may shower at that time.  You have steri-strips (small skin tapes) in place directly over the incision.  These strips should be left on the skin for 7-10 days.   °b. DERMABOND/SKIN GLUE - you may shower in 24 hours.  The glue will flake off over the next 2-3 weeks. °10. Any sutures or staples will be removed at the office during your follow-up visit. ° °ACTIVITIES °11. You may resume regular (light) daily activities beginning the next day--such as daily self-care, walking, climbing stairs--gradually increasing activities as tolerated.  You may have sexual intercourse when it is comfortable.  Refrain from any heavy lifting or straining until approved by your doctor. °a. You may drive when you are no longer taking prescription pain medication, you can comfortably wear a seatbelt, and you can safely maneuver your car and apply brakes. ° °FOLLOW-UP °12. You should see your doctor in the office for a follow-up appointment approximately 2-3 weeks after your surgery.  You should have been given your post-op/follow-up appointment when   your surgery was scheduled.  If you did not receive a post-op/follow-up appointment, make sure that you call for this appointment within a day or two after you arrive home to insure a convenient appointment time. ° °OTHER INSTRUCTIONS ° °WHEN TO CALL YOUR DOCTOR: °1. Fever over 101.0 °2. Inability to  urinate °3. Continued bleeding from incision. °4. Increased pain, redness, or drainage from the incision. °5. Increasing abdominal pain ° °The clinic staff is available to answer your questions during regular business hours.  Please don’t hesitate to call and ask to speak to one of the nurses for clinical concerns.  If you have a medical emergency, go to the nearest emergency room or call 911.  A surgeon from [facility] is always on call at the hospital. °[address], [address], [place], [place]  [address] ? [address] [address], [place], [place]   [address] °[phone] ? [phone] ? FAX [phone] ° ° ° °

## 2019-09-05 NOTE — Plan of Care (Signed)
  Problem: Pain Managment: Goal: General experience of comfort will improve Outcome: Progressing   Problem: Safety: Goal: Ability to remain free from injury will improve Outcome: Progressing   Problem: Education: Goal: Required Educational Video(s) Outcome: Progressing   Problem: Clinical Measurements: Goal: Postoperative complications will be avoided or minimized Outcome: Progressing   Problem: Skin Integrity: Goal: Demonstration of wound healing without infection will improve Outcome: Progressing   

## 2019-09-05 NOTE — Transfer of Care (Signed)
Immediate Anesthesia Transfer of Care Note  Patient: Samantha Gonzales  Procedure(s) Performed: LAPAROSCOPIC CHOLECYSTECTOMY WITH INTRAOPERATIVE CHOLANGIOGRAM (N/A Abdomen)  Patient Location: PACU  Anesthesia Type:General  Level of Consciousness: awake, alert , oriented and sedated  Airway & Oxygen Therapy: Patient Spontanous Breathing and Patient connected to nasal cannula oxygen  Post-op Assessment: Report given to RN, Post -op Vital signs reviewed and stable and Patient moving all extremities  Post vital signs: Reviewed and stable  Last Vitals:  Vitals Value Taken Time  BP 158/68 09/05/19 1225  Temp    Pulse 88 09/05/19 1225  Resp 22 09/05/19 1226  SpO2 95 % 09/05/19 1225  Vitals shown include unvalidated device data.  Last Pain:  Vitals:   09/05/19 0840  TempSrc:   PainSc: 3       Patients Stated Pain Goal: 0 (94/17/40 8144)  Complications: No apparent anesthesia complications

## 2019-09-05 NOTE — Anesthesia Postprocedure Evaluation (Signed)
Anesthesia Post Note  Patient: MILAYA HORA  Procedure(s) Performed: LAPAROSCOPIC CHOLECYSTECTOMY WITH INTRAOPERATIVE CHOLANGIOGRAM (N/A Abdomen)     Patient location during evaluation: PACU Anesthesia Type: General Level of consciousness: awake and alert Pain management: pain level controlled Vital Signs Assessment: post-procedure vital signs reviewed and stable Respiratory status: spontaneous breathing, nonlabored ventilation, respiratory function stable and patient connected to nasal cannula oxygen Cardiovascular status: blood pressure returned to baseline and stable Postop Assessment: no apparent nausea or vomiting Anesthetic complications: no    Last Vitals:  Vitals:   09/05/19 1417 09/05/19 2120  BP: (!) 143/57 (!) 140/56  Pulse: 91 88  Resp:  18  Temp: 37 C 37.1 C  SpO2: 100% 100%    Last Pain:  Vitals:   09/05/19 2120  TempSrc: Oral  PainSc:    Pain Goal: Patients Stated Pain Goal: 0 (09/04/19 1945)                 Casondra Gasca

## 2019-09-05 NOTE — Interval H&P Note (Signed)
History and Physical Interval Note:  09/05/2019 8:15 AM  Samantha Gonzales  has presented today for surgery, with the diagnosis of cholecystitis.  The various methods of treatment have been discussed with the patient and family. After consideration of risks, benefits and other options for treatment, the patient has consented to  Procedure(s): LAPAROSCOPIC CHOLECYSTECTOMY WITH POSSIBLE INTRAOPERATIVE CHOLANGIOGRAM (N/A) as a surgical intervention.  The patient's history has been reviewed, patient examined, no change in status, stable for surgery.  I have reviewed the patient's chart and labs.  Questions were answered to the patient's satisfaction.   Pt seen, examined and agree The procedure has been discussed with the patient. Operative and non operative treatments have been discussed. Risks of surgery include bleeding, infection,  Common bile duct injury,  Injury to the stomach,liver, colon,small intestine, abdominal wall,  Diaphragm,  Major blood vessels,  And the need for an open procedure.  Other risks include worsening of medical problems, death,  DVT and pulmonary embolism, and cardiovascular events.   Medical options have also been discussed. The patient has been informed of long term expectations of surgery and non surgical options,  The patient agrees to proceed.    Elfrida

## 2019-09-05 NOTE — Op Note (Signed)
Laparoscopic Cholecystectomy with IOC Procedure Note  Indications: This patient presents with symptomatic gallbladder disease and will undergo laparoscopic cholecystectomy. The procedure has been discussed with the patient. Operative and non operative treatments have been discussed. Risks of surgery include bleeding, infection,  Common bile duct injury,  Injury to the stomach,liver, colon,small intestine, abdominal wall,  Diaphragm,  Major blood vessels,  And the need for an open procedure.  Other risks include worsening of medical problems, death,  DVT and pulmonary embolism, and cardiovascular events.   Medical options have also been discussed. The patient has been informed of long term expectations of surgery and non surgical options,  The patient agrees to proceed.    Pre-operative Diagnosis: Calculus of gallbladder with acute cholecystitis, without mention of obstruction  Post-operative Diagnosis: Same with possible common bile duct obstruction/stone   Surgeon: Dortha Schwalbe  MD  Assistants: Lavell Anchors PA-C  Anesthesia: General endotracheal anesthesia and Local anesthesia 0.25.% bupivacaine, with epinephrine  ASA Class: 2  Procedure Details  The patient was seen again in the Holding Room. The risks, benefits, complications, treatment options, and expected outcomes were discussed with the patient. The possibilities of reaction to medication, pulmonary aspiration, perforation of viscus, bleeding, recurrent infection, finding a normal gallbladder, the need for additional procedures, failure to diagnose a condition, the possible need to convert to an open procedure, and creating a complication requiring transfusion or operation were discussed with the patient. The patient and/or family concurred with the proposed plan, giving informed consent. The site of surgery properly noted/marked. The patient was taken to Operating Room, identified as Samantha Gonzales and the procedure verified as Laparoscopic  Cholecystectomy with Intraoperative Cholangiograms. A Time Out was held and the above information confirmed.  Prior to the induction of general anesthesia, antibiotic prophylaxis was administered. General endotracheal anesthesia was then administered and tolerated well. After the induction, the abdomen was prepped in the usual sterile fashion. The patient was positioned in the supine position with the left arm comfortably tucked, along with some reverse Trendelenburg.  Local anesthetic agent was injected into the skin near the umbilicus and an incision made. The midline fascia was incised and the Hasson technique was used to introduce a 12 mm port under direct vision. It was secured with a figure of eight Vicryl suture placed in the usual fashion. Pneumoperitoneum was then created with CO2 and tolerated well without any adverse changes in the patient's vital signs. Additional trocars were introduced under direct vision with an 11 mm trocar in the epigastrium and two  5 mm trocars in the right upper quadrant. All skin incisions were infiltrated with a local anesthetic agent before making the incision and placing the trocars.   The gallbladder was identified, the fundus grasped and retracted cephalad. Adhesions were lysed bluntly and with the electrocautery where indicated, taking care not to injure any adjacent organs or viscus. The infundibulum was grasped and retracted laterally, exposing the peritoneum overlying the triangle of Calot. This was then divided and exposed in a blunt fashion. The cystic duct was clearly identified and bluntly dissected circumferentially. The junctions of the gallbladder, cystic duct and common bile duct were clearly identified prior to the division of any linear structure.   An incision was made in the cystic duct and the cholangiogram catheter introduced. The catheter was secured using an endoclip. The study showed obstruction from possible stones with good visualization of  the distal and proximal biliary tree.  The duct was dilated and there is no  evidence of excretion of contrast into the duodenum.  Contrast ended in the distal common bile duct at the level of the ampulla or just proximal to that.  Patient will require GI consultation for possible ERCP.  The catheter was then removed.   The cystic duct was then  ligated with surgical clips  on the patient side and  clipped on the gallbladder side and divided. The cystic artery was identified, dissected free, ligated with clips and divided as well. Posterior cystic artery clipped and divided.  The gallbladder was dissected from the liver bed in retrograde fashion with the electrocautery. The gallbladder was removed. The liver bed was irrigated and inspected. Hemostasis was achieved with the electrocautery. Copious irrigation was utilized and was repeatedly aspirated until clear all particulate matter. Hemostasis was achieved with no signs  Of bleeding or bile leakage.  Pneumoperitoneum was completely reduced after viewing removal of the trocars under direct vision. The wound was thoroughly irrigated and the fascia was then closed with a figure of eight suture; the skin was then closed with 4 0 monocryl  and a sterile dressing was applied.  Instrument, sponge, and needle counts were correct at closure and at the conclusion of the case.   Findings: Cholecystitis with Cholelithiasis  Estimated Blood Loss: less than 50 mL         Drains: none          Total IV Fluids: per record          Specimens: Gallbladder           Complications: None; patient tolerated the procedure well.         Disposition: PACU - hemodynamically stable.         Condition: stable

## 2019-09-06 ENCOUNTER — Encounter (HOSPITAL_COMMUNITY): Payer: Self-pay | Admitting: Surgery

## 2019-09-06 LAB — HEPATIC FUNCTION PANEL
ALT: 175 U/L — ABNORMAL HIGH (ref 0–44)
AST: 144 U/L — ABNORMAL HIGH (ref 15–41)
Albumin: 2.4 g/dL — ABNORMAL LOW (ref 3.5–5.0)
Alkaline Phosphatase: 380 U/L — ABNORMAL HIGH (ref 38–126)
Bilirubin, Direct: 0.4 mg/dL — ABNORMAL HIGH (ref 0.0–0.2)
Indirect Bilirubin: 0.3 mg/dL (ref 0.3–0.9)
Total Bilirubin: 0.7 mg/dL (ref 0.3–1.2)
Total Protein: 6.3 g/dL — ABNORMAL LOW (ref 6.5–8.1)

## 2019-09-06 LAB — COMPREHENSIVE METABOLIC PANEL
ALT: UNDETERMINED U/L (ref 0–44)
AST: 272 U/L — ABNORMAL HIGH (ref 15–41)
Albumin: 2.5 g/dL — ABNORMAL LOW (ref 3.5–5.0)
Alkaline Phosphatase: 454 U/L — ABNORMAL HIGH (ref 38–126)
Anion gap: 12 (ref 5–15)
BUN: 7 mg/dL — ABNORMAL LOW (ref 8–23)
CO2: 22 mmol/L (ref 22–32)
Calcium: 9.3 mg/dL (ref 8.9–10.3)
Chloride: 104 mmol/L (ref 98–111)
Creatinine, Ser: 1.08 mg/dL — ABNORMAL HIGH (ref 0.44–1.00)
GFR calc Af Amer: 58 mL/min — ABNORMAL LOW (ref >60)
GFR calc non Af Amer: 50 mL/min — ABNORMAL LOW (ref >60)
Glucose, Bld: 172 mg/dL — ABNORMAL HIGH (ref 70–99)
Potassium: 3.6 mmol/L (ref 3.5–5.1)
Sodium: 138 mmol/L (ref 135–145)
Total Bilirubin: UNDETERMINED mg/dL (ref 0.3–1.2)
Total Protein: 6.2 g/dL — ABNORMAL LOW (ref 6.5–8.1)

## 2019-09-06 LAB — CBC
HCT: 38.6 % (ref 36.0–46.0)
Hemoglobin: 13.4 g/dL (ref 12.0–15.0)
MCH: 33.9 pg (ref 26.0–34.0)
MCHC: 34.7 g/dL (ref 30.0–36.0)
MCV: 97.7 fL (ref 80.0–100.0)
Platelets: 192 10*3/uL (ref 150–400)
RBC: 3.95 MIL/uL (ref 3.87–5.11)
RDW: 13.9 % (ref 11.5–15.5)
WBC: 12.4 10*3/uL — ABNORMAL HIGH (ref 4.0–10.5)
nRBC: 0 % (ref 0.0–0.2)

## 2019-09-06 LAB — SURGICAL PATHOLOGY

## 2019-09-06 MED ORDER — ENOXAPARIN SODIUM 40 MG/0.4ML ~~LOC~~ SOLN
40.0000 mg | SUBCUTANEOUS | Status: DC
  Administered 2019-09-06 – 2019-09-14 (×7): 40 mg via SUBCUTANEOUS
  Filled 2019-09-06 (×7): qty 0.4

## 2019-09-06 MED ORDER — MORPHINE SULFATE (PF) 2 MG/ML IV SOLN
2.0000 mg | INTRAVENOUS | Status: DC | PRN
  Administered 2019-09-06 – 2019-09-07 (×3): 2 mg via INTRAVENOUS
  Filled 2019-09-06 (×2): qty 1
  Filled 2019-09-06: qty 2

## 2019-09-06 NOTE — Progress Notes (Signed)
Initial Nutrition Assessment  DOCUMENTATION CODES:   Non-severe (moderate) malnutrition in context of chronic illness  INTERVENTION:   -Boost Breeze po TID, each supplement provides 250 kcal and 9 grams of protein. 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  GOAL:   Patient will meet greater than or equal to 90% of their needs  MONITOR:   PO intake, Supplement acceptance, Diet advancement, Labs, Weight trends  REASON FOR ASSESSMENT:   Malnutrition Screening Tool   ASSESSMENT:   78 year old pt admitted with right sided abdominal pain with PMH of HTN and cholecystitis. Pt found to have enlarged gallbladder and underwent laparoscopic cholecystectomy on 09/05/19.  Pt was sleeping and had recently received pain medication. Her daughter, Butch Penny, was in the room and was able to answer questions. Pt lives with daughter. She stated the pt has been declining in functional status for several years. She believes this is due to the issues with her gallbladder. She stated the pt typically eats very little. At breakfast she may have a few bites of grits and some bacon and then refuse to eat anymore for the day. Sometimes goes days without eating. She believes the pts typical wt is about 167#. She said it would be difficult to get the pt to consume any supplement but would try.  Performed a limited NFPE due to the pt waking and saying she was in too much pain for an exam.   Pt has been on a clear liquid diet and has consumed <25% at all meals. Spoke with the nurse, Remo Lipps, and she said the pt is refusing meals, boost and drinks. Pt only wants pain medication.   Last charted wt 68.9kg from 07/28/18.   Boost Breeze po TID and MVI with minerals.   Medications Reviewed: Cipro IVPB 400mg , dextrose 5% and  0.45% NaCL with KCl 20 mEq/L, dilaudid 1mg , morphine 2mg , oxycodone 10mg   Labs Reviewed: glucose 130, potassium 3.2   NUTRITION - FOCUSED PHYSICAL EXAM:    Most Recent Value  Orbital Region  Moderate depletion  Upper Arm Region  Unable to assess  Thoracic and Lumbar Region  Unable to assess  Buccal Region  Moderate depletion  Temple Region  Moderate depletion  Clavicle Bone Region  Moderate depletion  Clavicle and Acromion Bone Region  Moderate depletion  Scapular Bone Region  Unable to assess  Dorsal Hand  Unable to assess  Patellar Region  Mild depletion  Anterior Thigh Region  Unable to assess  Posterior Calf Region  Unable to assess  Edema (RD Assessment)  Unable to assess  Hair  Reviewed  Eyes  Unable to assess  Mouth  Unable to assess  Skin  Reviewed  Nails  Unable to assess       Diet Order:   Diet Order            Diet NPO time specified  Diet effective midnight              EDUCATION NEEDS:   Not appropriate for education at this time  Skin:  Skin Assessment: Reviewed RN Assessment  Last BM:  11/8  Height:   Ht Readings from Last 1 Encounters:  09/07/19 6' (1.829 m)    Weight:   Wt Readings from Last 1 Encounters:  09/07/19 81.6 kg    Ideal  Body Weight:  72.7 kg  BMI:  Body mass index is 24.4 kg/m.  Estimated Nutritional Needs:   Kcal:  2200-2400  Protein:  110-125  Fluid:  >2.0L    Daine Gip Dietetic Intern Pager # [phone]

## 2019-09-06 NOTE — Progress Notes (Signed)
1 Day Post-Op  Subjective: CC: Patient reports that she is having abdominal pain in her RUQ and around her incisions. No N/V.   Objective: Vital signs in last 24 hours: Temp:  [97 F (36.1 C)-98.8 F (37.1 C)] 98.8 F (37.1 C) (11/09 2120) Pulse Rate:  [88-91] 88 (11/09 2120) Resp:  [13-26] 18 (11/09 2120) BP: (140-165)/(56-78) 140/56 (11/09 2120) SpO2:  [93 %-100 %] 100 % (11/09 2120) Last BM Date: 09/04/19  Intake/Output from previous day: 11/09 0701 - 11/10 0700 In: 2541 [P.O.:120; I.V.:2221; IV Piggyback:200] Out: 150 [Urine:150] Intake/Output this shift: No intake/output data recorded.  PE: Gen:  Alert, NAD, pleasant Card:  RRR Pulm:  CTAB, no W/R/R, effort normal Abd: Soft, ND, generalized tenderness without peritonitis, +BS, Incisions with glue intact appears well and are without drainage, bleeding, or signs of infection Ext:  No LE edema Skin: no rashes noted, warm and dry  Lab Results:  Recent Labs    09/04/19 0922 09/05/19 0440  WBC 14.2* 10.4  HGB 12.9 11.4*  HCT 39.3 34.2*  PLT 179 147*   BMET Recent Labs    09/05/19 0440 09/06/19 0253  NA 137 138  K 2.7* 3.6  CL 99 104  CO2 27 22  GLUCOSE 108* 172*  BUN 9 7*  CREATININE 1.15* 1.08*  CALCIUM 9.3 9.3   PT/INR No results for input(s): LABPROT, INR in the last 72 hours. CMP     Component Value Date/Time   NA 138 09/06/2019 0253   K 3.6 09/06/2019 0253   CL 104 09/06/2019 0253   CO2 22 09/06/2019 0253   GLUCOSE 172 (H) 09/06/2019 0253   BUN 7 (L) 09/06/2019 0253   CREATININE 1.08 (H) 09/06/2019 0253   CALCIUM 9.3 09/06/2019 0253   PROT 6.2 (L) 09/06/2019 0253   ALBUMIN 2.5 (L) 09/06/2019 0253   AST 272 (H) 09/06/2019 0253   ALT QUANTITY NOT SUFFICIENT, UNABLE TO PERFORM TEST 09/06/2019 0253   ALKPHOS 454 (H) 09/06/2019 0253   BILITOT QUANTITY NOT SUFFICIENT, UNABLE TO PERFORM TEST 09/06/2019 0253   GFRNONAA 50 (L) 09/06/2019 0253   GFRAA 58 (L) 09/06/2019 0253   Lipase      Component Value Date/Time   LIPASE 20 09/04/2019 7628       Studies/Results: Dg Cholangiogram Operative  Result Date: 09/05/2019 CLINICAL DATA:  78 year old female with cholelithiasis EXAM: INTRAOPERATIVE CHOLANGIOGRAM TECHNIQUE: Cholangiographic images from the C-arm fluoroscopic device were submitted for interpretation post-operatively. Please see the procedural report for the amount of contrast and the fluoroscopy time utilized. COMPARISON:  None. FINDINGS: Surgical instruments project over the upper abdomen. There is cannulation of the cystic duct/gallbladder neck, with antegrade infusion of contrast. Enlarged intrahepatic and extrahepatic biliary system. No filling defect identified. There is abrupt cutoff of the common bile duct. IMPRESSION: Intraoperative cholangiogram demonstrates enlarged biliary system of the intrahepatic and extrahepatic ducts, with abrupt cutoff of the common bile duct. Given the appearance the differential includes underlying stricture, obstructing mass, and/or retained stones and correlation with ERCP or potentially MRCP may be useful. Please refer to the dictated operative report for full details of intraoperative findings and procedure Electronically Signed   By: Corrie Mckusick D.O.   On: 09/05/2019 11:47   Ct Abdomen Pelvis W Contrast  Result Date: 09/04/2019 CLINICAL DATA:  Severe right lower quadrant abdominal pain. EXAM: CT ABDOMEN AND PELVIS WITH CONTRAST TECHNIQUE: Multidetector CT imaging of the abdomen and pelvis was performed using the standard protocol following bolus administration  of intravenous contrast. CONTRAST:  OMNIPAQUE IOHEXOL 300 MG/ML  SOLN COMPARISON:  CT abdomen pelvis 09/28/2015; CT abdomen pelvis 02/20/2011 FINDINGS: Lower chest: Normal heart size. Dependent atelectasis bilateral lower lobes. No pleural effusion. Hepatobiliary: Liver is normal in size and contour. Stable cyst within the central aspect of the liver. Persistent intrahepatic  and extrahepatic biliary ductal dilatation. Common bile duct measures 9 mm distally (image 39; series 3). The gallbladder is markedly distended measuring up to 18 cm in craniocaudal dimension. There is mild wall thickening of the mid aspect of the gallbladder. Sludge within the gallbladder lumen. Pancreas: Similar-appearing prominence of the pancreatic duct. No inflammatory change. Spleen: Unremarkable Adrenals/Urinary Tract: Normal adrenal glands. Bilateral renal cyst. Kidneys enhance symmetrically with contrast. No hydronephrosis. Urinary bladder is unremarkable. Stomach/Bowel: No abnormal bowel wall thickening or evidence for bowel obstruction. Normal appendix. No free fluid or free intraperitoneal air. Vascular/Lymphatic: Normal caliber abdominal aorta. Peripheral calcified atherosclerotic plaque. No retroperitoneal lymphadenopathy. Reproductive: Reflux of contrast into the left gonadal veins. Other: None. Musculoskeletal: Left hip joint degenerative changes. Lumbar spine degenerative changes. No aggressive or acute appearing osseous lesions. IMPRESSION: 1. The gallbladder is hydropic and markedly distended measuring up to 18 cm, extending caudally into the lower abdomen. There is some wall thickening of the mid aspect of the gallbladder. Findings raise the possibility of associated superimposed acute cholecystitis. 2. There is persistent intrahepatic and extrahepatic biliary ductal dilatation which is similar when compared to CT 02/14/2011. 3. These results were called by telephone at the time of interpretation on 09/04/2019 at 11:11 am to provider [facility] , who verbally acknowledged these results. Electronically Signed   By: Annia Belt M.D.   On: 09/04/2019 11:14    Anti-infectives: Anti-infectives (From admission, onward)   Start     Dose/Rate Route Frequency Ordered Stop   09/05/19 2300  ciprofloxacin (CIPRO) IVPB 400 mg     400 mg 200 mL/hr over 60 Minutes Intravenous Every 12 hours  09/05/19 1425 09/12/19 2259   09/04/19 2300  ciprofloxacin (CIPRO) IVPB 400 mg  Status:  Discontinued     400 mg 200 mL/hr over 60 Minutes Intravenous Every 12 hours 09/04/19 1314 09/05/19 1425   09/04/19 1115  ciprofloxacin (CIPRO) IVPB 400 mg     400 mg 200 mL/hr over 60 Minutes Intravenous  Once 09/04/19 1101 09/04/19 1433   09/04/19 1115  metroNIDAZOLE (FLAGYL) IVPB 500 mg     500 mg 100 mL/hr over 60 Minutes Intravenous  Once 09/04/19 1101 09/04/19 1334       Assessment/Plan HTN HLD  Acute Cholecystitis  - POD #1. S/p Lap Chole with IOC - Dr. Luisa Hart - 09/05/2019 - Patient with positive IOC. LFT's pending this AM. GI consult  - Cont abx   FEN - NPO VTE - SCDs  ID - Cipro 11/8 - 11/16   LOS: 1 day    Jacinto Halim , [facility] 09/06/2019, 8:31 AM Please see Amion for pager number during day hours 7:00am-4:30pm

## 2019-09-06 NOTE — Consult Note (Signed)
Reason for Consult: Positive IOC Referring Physician: Surgical team  Samantha Gonzales is an 78 y.o. female.  HPI: Patient seen and examined and case discussed with her daughter as well as my partner Dr. Levora Angel in her office computer chart was reviewed by him and her IOC reviewed by me and she is currently having more pain than before her cholecystectomy but she cannot say whether the pain is any different but she is thirsty and wants to drink and has no other new complaints and her previous endoscopy and colonoscopy from 3 years ago was reviewed  Past Medical History:  Diagnosis Date  . Hypertension     Past Surgical History:  Procedure Laterality Date  . CHOLECYSTECTOMY  09/05/2019  . CHOLECYSTECTOMY N/A 09/05/2019   Procedure: LAPAROSCOPIC CHOLECYSTECTOMY WITH INTRAOPERATIVE CHOLANGIOGRAM;  Surgeon: Harriette Bouillon, MD;  Location: [facility] OR;  Service: General;  Laterality: N/A;  . TUBAL LIGATION      History reviewed. No pertinent family history.  Social History:  reports that she has never smoked. Her smokeless tobacco use includes chew. She reports that she does not drink alcohol or use drugs.  Allergies:  Allergies  Allergen Reactions  . Penicillins Hives, Itching and Rash    Has patient had a PCN reaction causing immediate rash, facial/tongue/throat swelling, SOB or lightheadedness with hypotension: Yes Has patient had a PCN reaction causing severe rash involving mucus membranes or skin necrosis: yes Has patient had a PCN reaction that required hospitalization: unk Has patient had a PCN reaction occurring within the last 10 years: unk If all of the above answers are "NO", then may proceed with Cephalosporin use.     Medications: I have reviewed the patient's current medications.  Results for orders placed or performed during the hospital encounter of 09/04/19 (from the past 48 hour(s))  Surgical PCR screen     Status: None   Collection Time: 09/04/19  8:05 PM   Specimen:  Nasal Mucosa; Nasal Swab  Result Value Ref Range   MRSA, PCR NEGATIVE NEGATIVE   Staphylococcus aureus NEGATIVE NEGATIVE    Comment: (NOTE) The Xpert SA Assay (FDA approved for NASAL specimens in patients 44 years of age and older), is one component of a comprehensive surveillance program. It is not intended to diagnose infection nor to guide or monitor treatment. Performed at [facility], [address] [place], [place] [address]   Comprehensive metabolic panel     Status: Abnormal   Collection Time: 09/05/19  4:40 AM  Result Value Ref Range   Sodium 137 135 - 145 mmol/L   Potassium 2.7 (LL) 3.5 - 5.1 mmol/L    Comment: CRITICAL RESULT CALLED TO, READ BACK BY AND VERIFIED WITH: RAGAAS N,RN 11/09/250 0526 WAYK    Chloride 99 98 - 111 mmol/L   CO2 27 22 - 32 mmol/L   Glucose, Bld 108 (H) 70 - 99 mg/dL   BUN 9 8 - 23 mg/dL   Creatinine, Ser 4.97 (H) 0.44 - 1.00 mg/dL   Calcium 9.3 8.9 - 02.6 mg/dL   Total Protein 6.3 (L) 6.5 - 8.1 g/dL   Albumin 2.7 (L) 3.5 - 5.0 g/dL   AST 378 (H) 15 - 41 U/L   ALT 171 (H) 0 - 44 U/L   Alkaline Phosphatase 301 (H) 38 - 126 U/L   Total Bilirubin 1.0 0.3 - 1.2 mg/dL   GFR calc non Af Amer 46 (L) >60 mL/min   GFR calc Af Amer 54 (L) >60  mL/min   Anion gap 11 5 - 15    Comment: Performed at [facility] [address], [place], [place] [address]  CBC     Status: Abnormal   Collection Time: 09/05/19  4:40 AM  Result Value Ref Range   WBC 10.4 4.0 - 10.5 K/uL   RBC 3.43 (L) 3.87 - 5.11 MIL/uL   Hemoglobin 11.4 (L) 12.0 - 15.0 g/dL   HCT 34.2 (L) 36.0 - 46.0 %   MCV 99.7 80.0 - 100.0 fL   MCH 33.2 26.0 - 34.0 pg   MCHC 33.3 30.0 - 36.0 g/dL   RDW 14.0 11.5 - 15.5 %   Platelets 147 (L) 150 - 400 K/uL   nRBC 0.0 0.0 - 0.2 %    Comment: Performed at [facility], [place] [address] [place], [place] [address]  Comprehensive metabolic panel     Status: Abnormal   Collection Time: 09/06/19  2:53 AM  Result Value Ref Range    Sodium 138 135 - 145 mmol/L   Potassium 3.6 3.5 - 5.1 mmol/L   Chloride 104 98 - 111 mmol/L   CO2 22 22 - 32 mmol/L   Glucose, Bld 172 (H) 70 - 99 mg/dL   BUN 7 (L) 8 - 23 mg/dL   Creatinine, Ser 1.08 (H) 0.44 - 1.00 mg/dL   Calcium 9.3 8.9 - 10.3 mg/dL   Total Protein 6.2 (L) 6.5 - 8.1 g/dL   Albumin 2.5 (L) 3.5 - 5.0 g/dL   AST 272 (H) 15 - 41 U/L   ALT QUANTITY NOT SUFFICIENT, UNABLE TO PERFORM TEST 0 - 44 U/L   Alkaline Phosphatase 454 (H) 38 - 126 U/L   Total Bilirubin QUANTITY NOT SUFFICIENT, UNABLE TO PERFORM TEST 0.3 - 1.2 mg/dL   GFR calc non Af Amer 50 (L) >60 mL/min   GFR calc Af Amer 58 (L) >60 mL/min   Anion gap 12 5 - 15    Comment: Performed at [facility] [address], [place],  [address]    Dg Cholangiogram Operative  Result Date: 09/05/2019 CLINICAL DATA:  78 year old female with cholelithiasis EXAM: INTRAOPERATIVE CHOLANGIOGRAM TECHNIQUE: Cholangiographic images from the C-arm fluoroscopic device were submitted for interpretation post-operatively. Please see the procedural report for the amount of contrast and the fluoroscopy time utilized. COMPARISON:  None. FINDINGS: Surgical instruments project over the upper abdomen. There is cannulation of the cystic duct/gallbladder neck, with antegrade infusion of contrast. Enlarged intrahepatic and extrahepatic biliary system. No filling defect identified. There is abrupt cutoff of the common bile duct. IMPRESSION: Intraoperative cholangiogram demonstrates enlarged biliary system of the intrahepatic and extrahepatic ducts, with abrupt cutoff of the common bile duct. Given the appearance the differential includes underlying stricture, obstructing mass, and/or retained stones and correlation with ERCP or potentially MRCP may be useful. Please refer to the dictated operative report for full details of intraoperative findings and procedure Electronically Signed   By: Corrie Mckusick D.O.   On: 09/05/2019 11:47    ROS  negative except above Blood pressure (!) 140/56, pulse 96, temperature 98.8 F (37.1 C), temperature source Oral, resp. rate 20, SpO2 100 %. Physical Exam patient when she wants to answers questions appropriately vital signs stable afebrile lying comfortably in the bed lungs are clear decreased heart sounds abdomen is pertinent for moderate tenderness throughout although softer in the lower quadrants rare bowel sounds CT and IOC reviewed white count decreased yesterday liver test increased a little  Assessment/Plan: Positive IOC probable stone versus stricture Plan: The risk benefits methods and success rate of ERCP with possible stone extraction was discussed with the patient and her daughter and will proceed tomorrow morning with further work-up and plans pending those findings  Elverda Wendel E 09/06/2019, 12:48 PM

## 2019-09-07 ENCOUNTER — Encounter (HOSPITAL_COMMUNITY): Payer: Self-pay | Admitting: Emergency Medicine

## 2019-09-07 ENCOUNTER — Inpatient Hospital Stay (HOSPITAL_COMMUNITY): Payer: Medicare Other

## 2019-09-07 ENCOUNTER — Encounter (HOSPITAL_COMMUNITY): Admission: EM | Disposition: A | Discharge status: Home Health | Payer: Self-pay | Source: Home / Self Care

## 2019-09-07 ENCOUNTER — Inpatient Hospital Stay (HOSPITAL_COMMUNITY): Payer: Medicare Other | Admitting: Certified Registered Nurse Anesthetist

## 2019-09-07 DIAGNOSIS — E440 Moderate protein-calorie malnutrition: Secondary | ICD-10-CM | POA: Insufficient documentation

## 2019-09-07 HISTORY — PX: SPHINCTEROTOMY: SHX5544

## 2019-09-07 HISTORY — PX: PANCREATIC STENT PLACEMENT: SHX5539

## 2019-09-07 HISTORY — PX: ERCP: SHX5425

## 2019-09-07 HISTORY — PX: REMOVAL OF STONES: SHX5545

## 2019-09-07 LAB — CBC
HCT: 34.1 % — ABNORMAL LOW (ref 36.0–46.0)
Hemoglobin: 11.8 g/dL — ABNORMAL LOW (ref 12.0–15.0)
MCH: 33.6 pg (ref 26.0–34.0)
MCHC: 34.6 g/dL (ref 30.0–36.0)
MCV: 97.2 fL (ref 80.0–100.0)
Platelets: 180 10*3/uL (ref 150–400)
RBC: 3.51 MIL/uL — ABNORMAL LOW (ref 3.87–5.11)
RDW: 14.1 % (ref 11.5–15.5)
WBC: 11.7 10*3/uL — ABNORMAL HIGH (ref 4.0–10.5)
nRBC: 0 % (ref 0.0–0.2)

## 2019-09-07 LAB — COMPREHENSIVE METABOLIC PANEL
ALT: 140 U/L — ABNORMAL HIGH (ref 0–44)
AST: 103 U/L — ABNORMAL HIGH (ref 15–41)
Albumin: 2.1 g/dL — ABNORMAL LOW (ref 3.5–5.0)
Alkaline Phosphatase: 329 U/L — ABNORMAL HIGH (ref 38–126)
Anion gap: 10 (ref 5–15)
BUN: 9 mg/dL (ref 8–23)
CO2: 24 mmol/L (ref 22–32)
Calcium: 8.8 mg/dL — ABNORMAL LOW (ref 8.9–10.3)
Chloride: 101 mmol/L (ref 98–111)
Creatinine, Ser: 1.03 mg/dL — ABNORMAL HIGH (ref 0.44–1.00)
GFR calc Af Amer: >60 mL/min (ref >60)
GFR calc non Af Amer: 53 mL/min — ABNORMAL LOW (ref >60)
Glucose, Bld: 130 mg/dL — ABNORMAL HIGH (ref 70–99)
Potassium: 3.2 mmol/L — ABNORMAL LOW (ref 3.5–5.1)
Sodium: 135 mmol/L (ref 135–145)
Total Bilirubin: 0.7 mg/dL (ref 0.3–1.2)
Total Protein: 5.7 g/dL — ABNORMAL LOW (ref 6.5–8.1)

## 2019-09-07 SURGERY — ERCP, WITH INTERVENTION IF INDICATED
Anesthesia: General

## 2019-09-07 MED ORDER — SUCCINYLCHOLINE CHLORIDE 20 MG/ML IJ SOLN
INTRAMUSCULAR | Status: DC | PRN
  Administered 2019-09-07: 100 mg via INTRAVENOUS

## 2019-09-07 MED ORDER — DEXAMETHASONE SODIUM PHOSPHATE 10 MG/ML IJ SOLN
INTRAMUSCULAR | Status: DC | PRN
  Administered 2019-09-07: 4 mg via INTRAVENOUS

## 2019-09-07 MED ORDER — CIPROFLOXACIN IN D5W 400 MG/200ML IV SOLN: 400.0000 mg | INTRAVENOUS | Status: DC

## 2019-09-07 MED ORDER — LACTATED RINGERS IV SOLN
INTRAVENOUS | Status: DC | PRN
  Administered 2019-09-07: 12:00:00 via INTRAVENOUS

## 2019-09-07 MED ORDER — CIPROFLOXACIN IN D5W 400 MG/200ML IV SOLN
INTRAVENOUS | Status: AC
  Filled 2019-09-07: qty 200

## 2019-09-07 MED ORDER — INDOMETHACIN 50 MG RE SUPP
RECTAL | Status: DC | PRN
  Administered 2019-09-07: 100 mg via RECTAL

## 2019-09-07 MED ORDER — SODIUM CHLORIDE 0.9 % IV SOLN: INTRAVENOUS | Status: DC

## 2019-09-07 MED ORDER — PROMETHAZINE HCL 25 MG/ML IJ SOLN: 6.2500 mg | INTRAMUSCULAR | Status: DC | PRN

## 2019-09-07 MED ORDER — ROCURONIUM BROMIDE 50 MG/5ML IV SOSY
PREFILLED_SYRINGE | INTRAVENOUS | Status: DC | PRN
  Administered 2019-09-07: 35 mg via INTRAVENOUS

## 2019-09-07 MED ORDER — GLUCAGON HCL RDNA (DIAGNOSTIC) 1 MG IJ SOLR
INTRAMUSCULAR | Status: AC
  Filled 2019-09-07: qty 1

## 2019-09-07 MED ORDER — FENTANYL CITRATE (PF) 100 MCG/2ML IJ SOLN
INTRAMUSCULAR | Status: AC
  Filled 2019-09-07: qty 2

## 2019-09-07 MED ORDER — CHLORHEXIDINE GLUCONATE CLOTH 2 % EX PADS: 6.0000 | MEDICATED_PAD | Freq: Once | CUTANEOUS | Status: DC

## 2019-09-07 MED ORDER — ONDANSETRON HCL 4 MG/2ML IJ SOLN
INTRAMUSCULAR | Status: DC | PRN
  Administered 2019-09-07: 4 mg via INTRAVENOUS

## 2019-09-07 MED ORDER — SODIUM CHLORIDE 0.9 % IV SOLN
INTRAVENOUS | Status: DC | PRN
  Administered 2019-09-07: 25 mL

## 2019-09-07 MED ORDER — INDOMETHACIN 50 MG RE SUPP
RECTAL | Status: AC
  Filled 2019-09-07: qty 2

## 2019-09-07 MED ORDER — FENTANYL CITRATE (PF) 100 MCG/2ML IJ SOLN
50.0000 ug | Freq: Once | INTRAMUSCULAR | Status: AC
  Administered 2019-09-07: 50 ug via INTRAVENOUS

## 2019-09-07 MED ORDER — PHENYLEPHRINE HCL (PRESSORS) 10 MG/ML IV SOLN
INTRAVENOUS | Status: DC | PRN
  Administered 2019-09-07: 120 ug via INTRAVENOUS

## 2019-09-07 MED ORDER — FENTANYL CITRATE (PF) 100 MCG/2ML IJ SOLN
INTRAMUSCULAR | Status: DC | PRN
  Administered 2019-09-07: 100 ug via INTRAVENOUS

## 2019-09-07 MED ORDER — CIPROFLOXACIN IN D5W 400 MG/200ML IV SOLN
INTRAVENOUS | Status: DC | PRN
  Administered 2019-09-07: 400 mg via INTRAVENOUS

## 2019-09-07 MED ORDER — PROPOFOL 10 MG/ML IV BOLUS
INTRAVENOUS | Status: DC | PRN
  Administered 2019-09-07: 130 mg via INTRAVENOUS
  Administered 2019-09-07: 40 mg via INTRAVENOUS

## 2019-09-07 MED ORDER — LIDOCAINE 2% (20 MG/ML) 5 ML SYRINGE
INTRAMUSCULAR | Status: DC | PRN
  Administered 2019-09-07 (×2): 40 mg via INTRAVENOUS

## 2019-09-07 MED ORDER — SUGAMMADEX SODIUM 200 MG/2ML IV SOLN
INTRAVENOUS | Status: DC | PRN
  Administered 2019-09-07: 200 mg via INTRAVENOUS

## 2019-09-07 NOTE — Anesthesia Procedure Notes (Addendum)
Procedure Name: Intubation Date/Time: 09/07/2019 12:28 PM Performed by: Inda Coke, CRNA Pre-anesthesia Checklist: Patient identified, Emergency Drugs available, Suction available and Patient being monitored Patient Re-evaluated:Patient Re-evaluated prior to induction Oxygen Delivery Method: Circle System Utilized Preoxygenation: Pre-oxygenation with 100% oxygen Induction Type: IV induction Ventilation: Mask ventilation without difficulty Laryngoscope Size: Mac and 4 Grade View: Grade I Tube type: Oral Tube size: 7.0 mm Number of attempts: 1 Airway Equipment and Method: Stylet and Oral airway Placement Confirmation: ETT inserted through vocal cords under direct vision,  positive ETCO2 and breath sounds checked- equal and bilateral Secured at: 22 cm Tube secured with: Tape Dental Injury: Teeth and Oropharynx as per pre-operative assessment

## 2019-09-07 NOTE — Progress Notes (Addendum)
2 Days Post-Op  Subjective: CC: Patient complains of epigastrium abdominal pain and aorund her incisions. No n/v. She is insure of flatus. Last BM 11/8  Objective: Vital signs in last 24 hours: Temp:  [98.1 F (36.7 C)-99.5 F (37.5 C)] 99.5 F (37.5 C) (11/11 0542) Pulse Rate:  [90-102] 90 (11/11 0542) Resp:  [15-20] 15 (11/11 0542) BP: (136-151)/(59-68) 136/59 (11/11 0542) SpO2:  [98 %-100 %] 98 % (11/11 0542) Last BM Date: 09/04/19  Intake/Output from previous day: 11/10 0701 - 11/11 0700 In: 2309.1 [P.O.:240; I.V.:1469.1; IV Piggyback:600] Out: 5 [Urine:5] Intake/Output this shift: No intake/output data recorded.  PE: Gen:  Alert, NAD, pleasant Card:  RRR Pulm:  CTAB, no W/R/R, effort normal Abd: Soft, ND, generalized tenderness without peritonitis, +BS, Incisions with glue intact appears well and are without drainage, bleeding, or signs of infection Ext:  No LE edema Skin: no rashes noted, warm and dry  Lab Results:  Recent Labs    09/06/19 1718 09/07/19 0249  WBC 12.4* 11.7*  HGB 13.4 11.8*  HCT 38.6 34.1*  PLT 192 180   BMET Recent Labs    09/06/19 0253 09/07/19 0249  NA 138 135  K 3.6 3.2*  CL 104 101  CO2 22 24  GLUCOSE 172* 130*  BUN 7* 9  CREATININE 1.08* 1.03*  CALCIUM 9.3 8.8*   PT/INR No results for input(s): LABPROT, INR in the last 72 hours. CMP     Component Value Date/Time   NA 135 09/07/2019 0249   K 3.2 (L) 09/07/2019 0249   CL 101 09/07/2019 0249   CO2 24 09/07/2019 0249   GLUCOSE 130 (H) 09/07/2019 0249   BUN 9 09/07/2019 0249   CREATININE 1.03 (H) 09/07/2019 0249   CALCIUM 8.8 (L) 09/07/2019 0249   PROT 5.7 (L) 09/07/2019 0249   ALBUMIN 2.1 (L) 09/07/2019 0249   AST 103 (H) 09/07/2019 0249   ALT 140 (H) 09/07/2019 0249   ALKPHOS 329 (H) 09/07/2019 0249   BILITOT 0.7 09/07/2019 0249   GFRNONAA 53 (L) 09/07/2019 0249   GFRAA >60 09/07/2019 0249   Lipase     Component Value Date/Time   LIPASE 20 09/04/2019 3419        Studies/Results: Dg Cholangiogram Operative  Result Date: 09/05/2019 CLINICAL DATA:  78 year old female with cholelithiasis EXAM: INTRAOPERATIVE CHOLANGIOGRAM TECHNIQUE: Cholangiographic images from the C-arm fluoroscopic device were submitted for interpretation post-operatively. Please see the procedural report for the amount of contrast and the fluoroscopy time utilized. COMPARISON:  None. FINDINGS: Surgical instruments project over the upper abdomen. There is cannulation of the cystic duct/gallbladder neck, with antegrade infusion of contrast. Enlarged intrahepatic and extrahepatic biliary system. No filling defect identified. There is abrupt cutoff of the common bile duct. IMPRESSION: Intraoperative cholangiogram demonstrates enlarged biliary system of the intrahepatic and extrahepatic ducts, with abrupt cutoff of the common bile duct. Given the appearance the differential includes underlying stricture, obstructing mass, and/or retained stones and correlation with ERCP or potentially MRCP may be useful. Please refer to the dictated operative report for full details of intraoperative findings and procedure Electronically Signed   By: Gilmer Mor D.O.   On: 09/05/2019 11:47    Anti-infectives: Anti-infectives (From admission, onward)   Start     Dose/Rate Route Frequency Ordered Stop   09/05/19 2300  ciprofloxacin (CIPRO) IVPB 400 mg     400 mg 200 mL/hr over 60 Minutes Intravenous Every 12 hours 09/05/19 1425 09/12/19 2259   09/04/19 2300  ciprofloxacin (CIPRO) IVPB 400 mg  Status:  Discontinued     400 mg 200 mL/hr over 60 Minutes Intravenous Every 12 hours 09/04/19 1314 09/05/19 1425   09/04/19 1115  ciprofloxacin (CIPRO) IVPB 400 mg     400 mg 200 mL/hr over 60 Minutes Intravenous  Once 09/04/19 1101 09/04/19 1433   09/04/19 1115  metroNIDAZOLE (FLAGYL) IVPB 500 mg     500 mg 100 mL/hr over 60 Minutes Intravenous  Once 09/04/19 1101 09/04/19 1334       Assessment/Plan  HTN HLD  Acute Cholecystitis  - POD #2. S/p Lap Chole with IOC - Dr. Brantley Stage - 09/05/2019 - Patient with positive IOC. GI to perform ERCP today - Cont abx as below - Ambulate - Pulm toilet   FEN - NPO VTE - SCDs, Lovenox  ID - Cipro 11/8 - 11/16   LOS: 2 days    Jillyn Ledger , [facility] 09/07/2019, 8:37 AM Please see Amion for pager number during day hours 7:00am-4:30pm

## 2019-09-07 NOTE — Transfer of Care (Signed)
Immediate Anesthesia Transfer of Care Note  Patient: Samantha Gonzales  Procedure(s) Performed: ENDOSCOPIC RETROGRADE CHOLANGIOPANCREATOGRAPHY (ERCP) (N/A ) SPHINCTEROTOMY PANCREATIC STENT PLACEMENT  Patient Location: PACU  Anesthesia Type:General  Level of Consciousness: awake and alert   Airway & Oxygen Therapy: Patient Spontanous Breathing and Patient connected to nasal cannula oxygen  Post-op Assessment: Report given to RN and Post -op Vital signs reviewed and stable  Post vital signs: Reviewed and stable  Last Vitals:  Vitals Value Taken Time  BP 164/67 09/07/19 1328  Temp 37.2 C 09/07/19 1328  Pulse 93 09/07/19 1328  Resp 18 09/07/19 1332  SpO2 98 % 09/07/19 1328  Vitals shown include unvalidated device data.  Last Pain:  Vitals:   09/07/19 1328  TempSrc: Temporal  PainSc:       Patients Stated Pain Goal: 0 (28/36/62 9476)  Complications: No apparent anesthesia complications

## 2019-09-07 NOTE — Anesthesia Postprocedure Evaluation (Signed)
Anesthesia Post Note  Patient: Samantha Gonzales  Procedure(s) Performed: ENDOSCOPIC RETROGRADE CHOLANGIOPANCREATOGRAPHY (ERCP) (N/A ) SPHINCTEROTOMY PANCREATIC STENT PLACEMENT REMOVAL OF STONES     Patient location during evaluation: PACU Anesthesia Type: General Level of consciousness: awake and alert and oriented Pain management: pain level controlled Vital Signs Assessment: post-procedure vital signs reviewed and stable Respiratory status: spontaneous breathing, nonlabored ventilation and respiratory function stable Cardiovascular status: blood pressure returned to baseline Postop Assessment: no apparent nausea or vomiting Anesthetic complications: no    Last Vitals:  Vitals:   09/07/19 1336 09/07/19 1348  BP: (!) 168/56 (!) 160/59  Pulse:    Resp: 16 19  Temp:    SpO2: 100% 100%    Last Pain:  Vitals:   09/07/19 1328  TempSrc: Temporal  PainSc:                  Brennan Bailey

## 2019-09-07 NOTE — Anesthesia Preprocedure Evaluation (Addendum)
Anesthesia Evaluation  Patient identified by MRN, date of birth, ID band Patient awake    Reviewed: Allergy & Precautions, NPO status , Patient's Chart, lab work & pertinent test results  History of Anesthesia Complications Negative for: history of anesthetic complications  Airway Mallampati: II  TM Distance: >3 FB Neck ROM: Full    Dental no notable dental hx. (+) Poor Dentition,    Pulmonary neg pulmonary ROS,    Pulmonary exam normal        Cardiovascular hypertension, Pt. on medications Normal cardiovascular exam  TTE 12/2017: mild focal basal hypertrophy of the septum, EF 27-06%, grade 1 diastolic dysfunction    Neuro/Psych negative neurological ROS  negative psych ROS   GI/Hepatic Neg liver ROS, GERD  Medicated and Controlled,  Endo/Other  negative endocrine ROS  Renal/GU negative Renal ROS  negative genitourinary   Musculoskeletal negative musculoskeletal ROS (+)   Abdominal   Peds  Hematology negative hematology ROS (+)   Anesthesia Other Findings Day of surgery medications reviewed with patient.  Reproductive/Obstetrics negative OB ROS                            Anesthesia Physical Anesthesia Plan  ASA: II  Anesthesia Plan: General   Post-op Pain Management:    Induction: Intravenous  PONV Risk Score and Plan: 3 and Treatment may vary due to age or medical condition, Ondansetron and Dexamethasone  Airway Management Planned: Oral ETT  Additional Equipment: None  Intra-op Plan:   Post-operative Plan: Extubation in OR  Informed Consent: I have reviewed the patients History and Physical, chart, labs and discussed the procedure including the risks, benefits and alternatives for the proposed anesthesia with the patient or authorized representative who has indicated his/her understanding and acceptance.     Dental advisory given  Plan Discussed with: CRNA  Anesthesia  Plan Comments:        Anesthesia Quick Evaluation

## 2019-09-07 NOTE — Op Note (Signed)
[facility] Patient Name: Samantha Gonzales Procedure Date : 09/07/2019 MRN: [id number] Attending MD: Clarene Essex , MD Date of Birth: 10-11-1941 CSN: [id number] Age: 78 Admit Type: Inpatient Procedure:                ERCP Indications:              Filling defect on intraoperative cholangiogram,                            Suspected bile duct stone(s), Elevated liver                            enzymes and abdominal pain Providers:                Clarene Essex, MD, Baird Cancer, RN, Lazaro Arms,                            Technician Referring MD:              Medicines:                General Anesthesia Complications:            No immediate complications. Estimated Blood Loss:     Estimated blood loss: none. Procedure:                Pre-Anesthesia Assessment:                           - Prior to the procedure, a History and Physical                            was performed, and patient medications and                            allergies were reviewed. The patient's tolerance of                            previous anesthesia was also reviewed. The risks                            and benefits of the procedure and the sedation                            options and risks were discussed with the patient.                            All questions were answered, and informed consent                            was obtained. Prior Anticoagulants: The patient has                            taken no previous anticoagulant or antiplatelet                            agents. ASA Grade Assessment: II -  A patient with                            mild systemic disease. After reviewing the risks                            and benefits, the patient was deemed in                            satisfactory condition to undergo the procedure.                           After obtaining informed consent, the scope was                            passed under direct vision. Throughout the             procedure, the patient's blood pressure, pulse, and                            oxygen saturations were monitored continuously. The                            TJF-Q180V ([id number]) Olympus duodenoscope was                            introduced through the mouth, and used to inject                            contrast into and used to cannulate the bile duct.                            The ERCP was accomplished without difficulty. The                            patient tolerated the procedure well. Scope In: Scope Out: Findings:      The major papilla was normal. on the first attempt at cannulation the       wire went towards the pancreas and we elected to place one 4 Fr by 3 cm       plastic stent with a 3/4 external pigtail was placed 2.5 cm into the       ventral pancreatic duct. The stent was in good position. We then readily       obtained deep selective cannulation of the CBD with the sphincterotome       and wire and obvious filling defects were found in the CBD and we       proceeded with a biliary sphincterotomy which was made with a Hydratome       sphincterotome using ERBE electrocautery. There was no       post-sphincterotomy bleeding. We proceeded with sphincterotomy until we       had adequate biliary drainage and the fully bowed sphincterotome could       easily enter the duct and the lower third of the main bile duct and       upper third of the main bile duct  contained filling defect(s) thought to       be sludge as a lot was seen draining initially with just the       sphincterotomy. The biliary tree was swept with an adjustable 12- 15 mm       balloon starting at the bifurcation and right intrahepatic duct at the       end of the procedure to make sure no stones were in that location. Lots       of sludge was swept from the duct. However after multiple balloon       pull-through's using both size balloons nothing was found on subsequent       balloon pull-through's  and we elected to pull the 15 mm balloon to the       sphincterotomy site and decrease it to 12 after the 15 mm balloon was       withdrawn with mild resistance but no resistance and withdrawing the 12.       And we tried to proceed with an occlusion cholangiogram but there was       too much air in the system to be sure but she had excellent biliary       drainage and multiple balloon pull-through's as above we elected to stop       the procedure at this point and the patient tolerated the procedure well       there was no obvious immediate complication Impression:               - The major papilla appeared normal. However just                            before positioning ourselves properly for the                            sphincterotomy area just above the papilla did seem                            to be bulbous                           - The examination was suspicious for sludge. This                            was treated with a sphincterotomy and multiple                            balloon pull-through's.                           - One plastic stent was placed into the ventral                            pancreatic duct.                           - A biliary sphincterotomy was performed.                           - The biliary tree was swept and sludge  and at the                            end of the procedure nothing were found. Recommendation:           - Clear liquid diet for 6 hours. If doing well at 7                            PM may have soft solids if not may try to advance                            diet tomorrow                           - Continue present medications. Repeat liver tests                            tomorrow and if needed recheck as outpatient to                            confirm back to normal                           - Return to GI clinic PRN.                           - Telephone GI clinic if symptomatic PRN. Will need                            a  flat and upright abdominal x-ray in 1 to 2 weeks                            to confirm passage of the pancreatic duct stent on                            its own Procedure Code(s):        --- Professional ---                           [phone], Endoscopic retrograde                            cholangiopancreatography (ERCP); with placement of                            endoscopic stent into biliary or pancreatic duct,                            including pre- and post-dilation and guide wire                            passage, when performed, including sphincterotomy,                            when performed, each  stent                           [id number], Endoscopic retrograde                            cholangiopancreatography (ERCP); with removal of                            calculi/debris from biliary/pancreatic duct(s)                           43262, 59, Endoscopic retrograde                            cholangiopancreatography (ERCP); with                            sphincterotomy/papillotomy Diagnosis Code(s):        --- Professional ---                           R74.8, Abnormal levels of other serum enzymes                           R93.2, Abnormal findings on diagnostic imaging of                            liver and biliary tract CPT copyright 2019 American Medical Association. All rights reserved. The codes documented in this report are preliminary and upon coder review may  be revised to meet current compliance requirements. Vida RiggerMarc Liset Mcmonigle, MD 09/07/2019 1:23:39 PM This report has been signed electronically. Number of Addenda: 0

## 2019-09-07 NOTE — Progress Notes (Signed)
Samantha Gonzales 11:13 AM  Subjective: Patient without any new complaints but still in significant pain we again discussed the procedure with her and her daughter  Objective: Signs stable afebrile exam please see preassessment evaluation abdomen still fairly tender but seems softer today occasional bowel sounds white count stable liver tests slight decrease Intra-Op cholangiogram reviewed CT reviewed  Assessment: Positive Intra-Op cholangiogram  Plan: Okay to proceed with ERCP with anesthesia assistance with further work-up and plans pending those findings  [facility]  office [phone] After 5PM or if no answer call [phone]

## 2019-09-07 NOTE — Plan of Care (Signed)

## 2019-09-08 LAB — CBC WITH DIFFERENTIAL/PLATELET
Abs Immature Granulocytes: 0.4 10*3/uL — ABNORMAL HIGH (ref 0.00–0.07)
Basophils Absolute: 0 10*3/uL (ref 0.0–0.1)
Basophils Relative: 0 %
Eosinophils Absolute: 0 10*3/uL (ref 0.0–0.5)
Eosinophils Relative: 0 %
HCT: 32.1 % — ABNORMAL LOW (ref 36.0–46.0)
Hemoglobin: 11.1 g/dL — ABNORMAL LOW (ref 12.0–15.0)
Immature Granulocytes: 3 %
Lymphocytes Relative: 3 %
Lymphs Abs: 0.4 10*3/uL — ABNORMAL LOW (ref 0.7–4.0)
MCH: 33.4 pg (ref 26.0–34.0)
MCHC: 34.6 g/dL (ref 30.0–36.0)
MCV: 96.7 fL (ref 80.0–100.0)
Monocytes Absolute: 0.6 10*3/uL (ref 0.1–1.0)
Monocytes Relative: 4 %
Neutro Abs: 12.9 10*3/uL — ABNORMAL HIGH (ref 1.7–7.7)
Neutrophils Relative %: 90 %
Platelets: 198 10*3/uL (ref 150–400)
RBC: 3.32 MIL/uL — ABNORMAL LOW (ref 3.87–5.11)
RDW: 13.4 % (ref 11.5–15.5)
WBC: 14.3 10*3/uL — ABNORMAL HIGH (ref 4.0–10.5)
nRBC: 0 % (ref 0.0–0.2)

## 2019-09-08 LAB — COMPREHENSIVE METABOLIC PANEL
ALT: 101 U/L — ABNORMAL HIGH (ref 0–44)
AST: 69 U/L — ABNORMAL HIGH (ref 15–41)
Albumin: 2.1 g/dL — ABNORMAL LOW (ref 3.5–5.0)
Alkaline Phosphatase: 261 U/L — ABNORMAL HIGH (ref 38–126)
Anion gap: 10 (ref 5–15)
BUN: 13 mg/dL (ref 8–23)
CO2: 24 mmol/L (ref 22–32)
Calcium: 8.8 mg/dL — ABNORMAL LOW (ref 8.9–10.3)
Chloride: 103 mmol/L (ref 98–111)
Creatinine, Ser: 1.07 mg/dL — ABNORMAL HIGH (ref 0.44–1.00)
GFR calc Af Amer: 58 mL/min — ABNORMAL LOW (ref >60)
GFR calc non Af Amer: 50 mL/min — ABNORMAL LOW (ref >60)
Glucose, Bld: 145 mg/dL — ABNORMAL HIGH (ref 70–99)
Potassium: 3.5 mmol/L (ref 3.5–5.1)
Sodium: 137 mmol/L (ref 135–145)
Total Bilirubin: 0.5 mg/dL (ref 0.3–1.2)
Total Protein: 5.7 g/dL — ABNORMAL LOW (ref 6.5–8.1)

## 2019-09-08 LAB — LIPASE, BLOOD: Lipase: 20 U/L (ref 11–51)

## 2019-09-08 MED ORDER — MORPHINE SULFATE (PF) 2 MG/ML IV SOLN: 2.0000 mg | INTRAVENOUS | Status: DC | PRN

## 2019-09-08 MED ORDER — HYDROMORPHONE HCL 1 MG/ML IJ SOLN
1.0000 mg | INTRAMUSCULAR | Status: DC | PRN
  Administered 2019-09-08 – 2019-09-11 (×6): 1 mg via INTRAVENOUS
  Filled 2019-09-08 (×8): qty 1

## 2019-09-08 MED ORDER — KETOROLAC TROMETHAMINE 15 MG/ML IJ SOLN
15.0000 mg | Freq: Once | INTRAMUSCULAR | Status: AC
  Administered 2019-09-08: 15 mg via INTRAVENOUS
  Filled 2019-09-08: qty 1

## 2019-09-08 MED ORDER — METHOCARBAMOL 500 MG PO TABS
500.0000 mg | ORAL_TABLET | Freq: Four times a day (QID) | ORAL | Status: DC | PRN
  Administered 2019-09-08 – 2019-09-14 (×16): 500 mg via ORAL
  Filled 2019-09-08 (×17): qty 1

## 2019-09-08 MED ORDER — KETOROLAC TROMETHAMINE 15 MG/ML IJ SOLN
15.0000 mg | Freq: Four times a day (QID) | INTRAMUSCULAR | Status: AC | PRN
  Administered 2019-09-09 – 2019-09-13 (×12): 15 mg via INTRAVENOUS
  Filled 2019-09-08 (×13): qty 1

## 2019-09-08 NOTE — Progress Notes (Signed)
1 Day Post-Op  Subjective: CC: Patient s/p ERCP yesterday. She complains of right sided abdominal pain since surgery. She notes that it is constant, and at times worsens with a "cramping like pain". She denies n/v. She is currently on clears and denies n/v. She has mobilized in her room to and from the bathroom several times. She lives at home with her daughter.   Objective: Vital signs in last 24 hours: Temp:  [98.2 F (36.8 C)-98.9 F (37.2 C)] 98.4 F (36.9 C) (11/12 0447) Pulse Rate:  [72-90] 72 (11/12 0447) Resp:  [16-19] 18 (11/12 0447) BP: (118-168)/(56-69) 118/68 (11/12 0447) SpO2:  [98 %-100 %] 100 % (11/12 0447) Weight:  [81.6 kg] 81.6 kg (11/11 0907) Last BM Date: 09/04/19  Intake/Output from previous day: 11/11 0701 - 11/12 0700 In: 706.6 [I.V.:706.6] Out: 0  Intake/Output this shift: No intake/output data recorded.  PE: Gen: Alert, NAD, pleasant Card: RRR Pulm: CTAB, no W/R/R, effort normal Abd: Soft, ND,epigsatric tenderness without peritonitis,+BS, Incisions with glue intact appears well and are without drainage, bleeding, or signs of infection Ext: No LE edema Skin: no rashes noted, warm and dry  Lab Results:  Recent Labs    09/07/19 0249 09/08/19 0524  WBC 11.7* 14.3*  HGB 11.8* 11.1*  HCT 34.1* 32.1*  PLT 180 198   BMET Recent Labs    09/07/19 0249 09/08/19 0524  NA 135 137  K 3.2* 3.5  CL 101 103  CO2 24 24  GLUCOSE 130* 145*  BUN 9 13  CREATININE 1.03* 1.07*  CALCIUM 8.8* 8.8*   PT/INR No results for input(s): LABPROT, INR in the last 72 hours. CMP     Component Value Date/Time   NA 137 09/08/2019 0524   K 3.5 09/08/2019 0524   CL 103 09/08/2019 0524   CO2 24 09/08/2019 0524   GLUCOSE 145 (H) 09/08/2019 0524   BUN 13 09/08/2019 0524   CREATININE 1.07 (H) 09/08/2019 0524   CALCIUM 8.8 (L) 09/08/2019 0524   PROT 5.7 (L) 09/08/2019 0524   ALBUMIN 2.1 (L) 09/08/2019 0524   AST 69 (H) 09/08/2019 0524   ALT 101 (H)  09/08/2019 0524   ALKPHOS 261 (H) 09/08/2019 0524   BILITOT 0.5 09/08/2019 0524   GFRNONAA 50 (L) 09/08/2019 0524   GFRAA 58 (L) 09/08/2019 0524   Lipase     Component Value Date/Time   LIPASE 20 09/04/2019 8032       Studies/Results: Dg Ercp Biliary & Pancreatic Ducts  Result Date: 09/07/2019 CLINICAL DATA:  Choledocholithiasis, sphincterotomy EXAM: ERCP TECHNIQUE: Multiple spot images obtained with the fluoroscopic device and submitted for interpretation post-procedure. COMPARISON:  Intra op cholangiogram 09/05/2019 FINDINGS: A series of fluoroscopic spot images document endoscopic cannulation and opacification of the CBD. Limited opacification of the intrahepatic biliary tree, incompletely visualized. Cholecystectomy clips. No evidence of cystic duct stump leak. No evidence of extravasation on the final image. IMPRESSION: Endoscopic CBD cannulation and intervention These images were submitted for radiologic interpretation only. Please see the procedural report for the amount of contrast and the fluoroscopy time utilized. Electronically Signed   By: Corlis Leak M.D.   On: 09/07/2019 13:27    Anti-infectives: Anti-infectives (From admission, onward)   Start     Dose/Rate Route Frequency Ordered Stop   09/08/19 0600  ciprofloxacin (CIPRO) IVPB 400 mg  Status:  Discontinued     400 mg 200 mL/hr over 60 Minutes Intravenous On call to O.R. 09/07/19 1311 09/07/19 1414  09/05/19 2300  ciprofloxacin (CIPRO) IVPB 400 mg     400 mg 200 mL/hr over 60 Minutes Intravenous Every 12 hours 09/05/19 1425 09/12/19 2259   09/04/19 2300  ciprofloxacin (CIPRO) IVPB 400 mg  Status:  Discontinued     400 mg 200 mL/hr over 60 Minutes Intravenous Every 12 hours 09/04/19 1314 09/05/19 1425   09/04/19 1115  ciprofloxacin (CIPRO) IVPB 400 mg     400 mg 200 mL/hr over 60 Minutes Intravenous  Once 09/04/19 1101 09/04/19 1433   09/04/19 1115  metroNIDAZOLE (FLAGYL) IVPB 500 mg     500 mg 100 mL/hr over 60  Minutes Intravenous  Once 09/04/19 1101 09/04/19 1334       Assessment/Plan HTN HLD  Acute Cholecystitis - POD #3 S/p Lap Chole with IOC - Dr. Brantley Stage - 09/05/2019 - S/p ERCP with Dr. Watt Climes on 11/11. Will need flat and upright abdominal xray in 1-2 weeks to confirm passage of pancreatic stent per GI's note.  - Cont abxas below - Ambulate - Pulm toilet   FEN -Diet regular VTE -SCDs, Lovenox  ID -Cipro 11/8 - 11/16 Follow-up: DOW,  POC - attempted to call the daughter this morning without answer   Plan: Mobilize, adv diet to reg. Pain control with oral medications. Possible d/c later today.    LOS: 3 days    Jillyn Ledger , [facility] Surgery 09/08/2019, 8:12 AM Please see Amion for pager number during day hours 7:00am-4:30pm

## 2019-09-08 NOTE — Progress Notes (Signed)
Lyla Glassing 8:46 AM  Subjective: Patient looks much better and occasionally will smile however she says her pain is about the same and she does not have much of an appetite and we discussed her pain medicines she has no new complaints and is walking to the bathroom  Objective: Vital signs stable afebrile patient looks better as above abdomen is soft good bowel sounds still some tenderness on palpation in all quadrants white count slight increase all liver tests decreasing  Assessment: Status post lap chole and ERCP with sphincterotomy  Plan: She will need a follow-up x-ray and liver test as per procedure note and we will check on her tomorrow if she is in the hospital and if her pain continues as either an inpatient or outpatient consider repeat CT scan and have encouraged her to walk and try to eat more  [facility] E  office [phone] After 5PM or if no answer call [phone]

## 2019-09-08 NOTE — Evaluation (Signed)
Physical Therapy Evaluation Patient Details Name: Samantha Gonzales MRN: [id number] DOB: 05/26/1941 Today's Date: 09/08/2019   History of Present Illness  Pt is a 78 y/o female admitted secondary to abdominal pain and found to have cholecystitis, now s/p ex lap and ERCP. PMH including but not limited to HTN.    Clinical Impression  Pt presented supine in bed with HOB elevated, awake and willing to participate in therapy session. Pt's daughter was present throughout session as well. Prior to admission, pt reported that she was incredibly independent with all functional mobility, ADLs and IADLs. Pt lives with her daughter in a single level home with a few steps to enter. At the time of evaluation, pt limited secondary to abdominal pain, fatigue, poor balance and coordination with gait and decreased awareness of current deficits. Discussed gait training with RW at next session and f/u HHPT upon d/c home. Pt and pt's daughter agreeable to plan. Pt would continue to benefit from skilled physical therapy services at this time while admitted and after d/c to address the below listed limitations in order to improve overall safety and independence with functional mobility.     Follow Up Recommendations Home health PT;Supervision/Assistance - 24 hour    Equipment Recommendations  Rolling walker with 5" wheels    Recommendations for Other Services       Precautions / Restrictions Precautions Precautions: Fall Restrictions Weight Bearing Restrictions: No      Mobility  Bed Mobility Overal bed mobility: Needs Assistance Bed Mobility: Supine to Sit;Sit to Supine     Supine to sit: Supervision Sit to supine: Supervision   General bed mobility comments: for safety  Transfers Overall transfer level: Needs assistance Equipment used: None Transfers: Sit to/from Stand Sit to Stand: Min guard         General transfer comment: performed x1 from EOB and x1 from toilet; pt refusing any  physical assistance, min guard for safety  Ambulation/Gait Ambulation/Gait assistance: Min guard;Min assist Gait Distance (Feet): 25 Feet Assistive device: None Gait Pattern/deviations: Step-through pattern;Decreased stride length;Staggering right;Staggering left Gait velocity: decreased   General Gait Details: pt with modest instability requiring constant close min guard with LOB x2 requiring min A to recover  Stairs            Wheelchair Mobility    Modified Rankin (Stroke Patients Only)       Balance Overall balance assessment: Needs assistance Sitting-balance support: Feet supported Sitting balance-Leahy Scale: Good     Standing balance support: During functional activity;No upper extremity supported Standing balance-Leahy Scale: Poor Standing balance comment: static is fair, dynamic is poor                             Pertinent Vitals/Pain Pain Assessment: Faces Faces Pain Scale: Hurts even more Pain Location: abdomen Pain Descriptors / Indicators: Grimacing;Guarding Pain Intervention(s): Monitored during session;Repositioned;Premedicated before session    Home Living Family/patient expects to be discharged to:: Private residence Living Arrangements: Children Available Help at Discharge: Family;Available PRN/intermittently Type of Home: House Home Access: Stairs to enter Entrance Stairs-Rails: Right Entrance Stairs-Number of Steps: 3 Home Layout: One level Home Equipment: None      Prior Function Level of Independence: Independent         Comments: drives     Hand Dominance        Extremity/Trunk Assessment   Upper Extremity Assessment Upper Extremity Assessment: Overall WFL for tasks assessed  Lower Extremity Assessment Lower Extremity Assessment: Overall WFL for tasks assessed    Cervical / Trunk Assessment Cervical / Trunk Assessment: Normal  Communication   Communication: No difficulties  Cognition  Arousal/Alertness: Awake/alert Behavior During Therapy: WFL for tasks assessed/performed Overall Cognitive Status: Impaired/Different from baseline Area of Impairment: Safety/judgement                         Safety/Judgement: Decreased awareness of safety;Decreased awareness of deficits            General Comments      Exercises     Assessment/Plan    PT Assessment Patient needs continued PT services  PT Problem List Decreased balance;Decreased mobility;Decreased coordination;Decreased knowledge of use of DME;Decreased safety awareness;Decreased knowledge of precautions       PT Treatment Interventions DME instruction;Gait training;Stair training;Therapeutic activities;Therapeutic exercise;Balance training;Functional mobility training;Neuromuscular re-education;Patient/family education    PT Goals (Current goals can be found in the Care Plan section)  Acute Rehab PT Goals Patient Stated Goal: "to go home" PT Goal Formulation: With patient/family Time For Goal Achievement: 09/22/19 Potential to Achieve Goals: Good    Frequency Min 3X/week   Barriers to discharge        Co-evaluation               AM-PAC PT "6 Clicks" Mobility  Outcome Measure Help needed turning from your back to your side while in a flat bed without using bedrails?: None Help needed moving from lying on your back to sitting on the side of a flat bed without using bedrails?: None Help needed moving to and from a bed to a chair (including a wheelchair)?: A Little Help needed standing up from a chair using your arms (e.g., wheelchair or bedside chair)?: None Help needed to walk in hospital room?: A Little Help needed climbing 3-5 steps with a railing? : A Little 6 Click Score: 21    End of Session   Activity Tolerance: Patient tolerated treatment well Patient left: in bed;with call bell/phone within reach;with family/visitor present Nurse Communication: Mobility status PT Visit  Diagnosis: Other abnormalities of gait and mobility (R26.89)    Time: 1205-1229 PT Time Calculation (min) (ACUTE ONLY): 24 min   Charges:   PT Evaluation $PT Eval Moderate Complexity: 1 Mod PT Treatments $Gait Training: 8-22 mins        Anastasio Champion, DPT  Acute Rehabilitation Services Pager [phone] Office [place] 09/08/2019, 1:54 PM

## 2019-09-08 NOTE — TOC Initial Note (Signed)
Transition of Care [facility]) - Initial/Assessment Note    Patient Details  Name: Samantha Gonzales MRN: [id number] Date of Birth: 1941/04/23  [facility] CM/SW Contact:    Marilu Favre, RN Phone Number: 09/08/2019, 3:55 PM  Clinical Narrative:                  Patient from home with daughter. Confirmed facesheet information with patient and daughter at bedside.   Will need home health order for HHPT . Also on day of discharge will need to call for walker  Expected Discharge Plan: [place] Barriers to Discharge: Continued Medical Work up   Patient Goals and CMS Choice Patient states their goals for this hospitalization and ongoing recovery are:: to go home CMS Medicare.gov Compare Post Acute Care list provided to:: Patient Choice offered to / list presented to : Patient  Expected Discharge Plan and Services Expected Discharge Plan: [place]   Discharge Planning Services: CM Consult Post Acute Care Choice: Home Health, Durable Medical Equipment Living arrangements for the past 2 months: Single Family Home                 DME Arranged: Walker rolling DME Agency: [employer] Date DME Agency Contacted: 09/08/19 Time DME Agency Contacted: [phone] Representative spoke with at DME Agency: Zack will need to call on day of discharge [place] Arranged: PT [place]: [place] Date [place]: 09/08/19 Time [place]: 33 Representative spoke with at [place]: Tommi Rumps  Prior Living Arrangements/Services Living arrangements for the past 2 months: [place] with:: Adult Children Patient language and need for interpreter reviewed:: Yes        Need for Family Participation in Patient Care: Yes (Comment) Care giver support system in place?: Yes (comment)   Criminal Activity/Legal Involvement Pertinent to Current Situation/Hospitalization: No - Comment as needed  Activities of Daily  Living Home Assistive Devices/Equipment: Eyeglasses ADL Screening (condition at time of admission) Patient's cognitive ability adequate to safely complete daily activities?: Yes Is the patient deaf or have difficulty hearing?: No Does the patient have difficulty seeing, even when wearing glasses/contacts?: No Does the patient have difficulty concentrating, remembering, or making decisions?: No Patient able to express need for assistance with ADLs?: Yes Does the patient have difficulty dressing or bathing?: No Independently performs ADLs?: Yes (appropriate for developmental age) Does the patient have difficulty walking or climbing stairs?: Yes Weakness of Legs: Both Weakness of Arms/Hands: None  Permission Sought/Granted   Permission granted to share information with : Yes, Verbal Permission Granted     Permission granted to share info w AGENCY: [place]        Emotional Assessment Appearance:: Appears stated age Attitude/Demeanor/Rapport: Engaged Affect (typically observed): Accepting Orientation: : Oriented to Self, Oriented to Place, Oriented to  Time, Oriented to Situation Alcohol / Substance Use: Not Applicable    Admission diagnosis:  Acute cholecystitis [K81.0] Right lower quadrant abdominal pain [R10.31] Patient Active Problem List   Diagnosis Date Noted  . Malnutrition of moderate degree 09/07/2019  . Cholecystitis 09/04/2019  . Altered mental status 01/18/2018  . Altered mental state 01/17/2018   PCP:  System, Pcp Not In Pharmacy:   [facility] [place], [place] [address] [place] [address] Phone: [phone] Fax: [phone]     Social Determinants of Health (SDOH) Interventions  Readmission Risk Interventions No flowsheet data found.

## 2019-09-08 NOTE — Progress Notes (Addendum)
Pt c/o of severe abdominal pain rate of 10/10. Offered tylenol or morphine prn, pt stated "it doesn't work. Txt paged md on call. x1 of toradol 15mg  ordered.

## 2019-09-09 ENCOUNTER — Encounter (HOSPITAL_COMMUNITY): Payer: Self-pay | Admitting: Gastroenterology

## 2019-09-09 LAB — CULTURE, BLOOD (ROUTINE X 2)
Culture: NO GROWTH
Culture: NO GROWTH
Special Requests: ADEQUATE

## 2019-09-09 LAB — COMPREHENSIVE METABOLIC PANEL
ALT: 84 U/L — ABNORMAL HIGH (ref 0–44)
AST: 58 U/L — ABNORMAL HIGH (ref 15–41)
Albumin: 2.1 g/dL — ABNORMAL LOW (ref 3.5–5.0)
Alkaline Phosphatase: 218 U/L — ABNORMAL HIGH (ref 38–126)
Anion gap: 10 (ref 5–15)
BUN: 10 mg/dL (ref 8–23)
CO2: 25 mmol/L (ref 22–32)
Calcium: 8.6 mg/dL — ABNORMAL LOW (ref 8.9–10.3)
Chloride: 103 mmol/L (ref 98–111)
Creatinine, Ser: 0.9 mg/dL (ref 0.44–1.00)
GFR calc Af Amer: >60 mL/min (ref >60)
GFR calc non Af Amer: >60 mL/min (ref >60)
Glucose, Bld: 117 mg/dL — ABNORMAL HIGH (ref 70–99)
Potassium: 2.8 mmol/L — ABNORMAL LOW (ref 3.5–5.1)
Sodium: 138 mmol/L (ref 135–145)
Total Bilirubin: 0.2 mg/dL — ABNORMAL LOW (ref 0.3–1.2)
Total Protein: 5.6 g/dL — ABNORMAL LOW (ref 6.5–8.1)

## 2019-09-09 LAB — CBC
HCT: 33.6 % — ABNORMAL LOW (ref 36.0–46.0)
Hemoglobin: 11.4 g/dL — ABNORMAL LOW (ref 12.0–15.0)
MCH: 32.9 pg (ref 26.0–34.0)
MCHC: 33.9 g/dL (ref 30.0–36.0)
MCV: 96.8 fL (ref 80.0–100.0)
Platelets: 250 10*3/uL (ref 150–400)
RBC: 3.47 MIL/uL — ABNORMAL LOW (ref 3.87–5.11)
RDW: 13.4 % (ref 11.5–15.5)
WBC: 10.2 10*3/uL (ref 4.0–10.5)
nRBC: 0 % (ref 0.0–0.2)

## 2019-09-09 MED ORDER — ENSURE ENLIVE PO LIQD
237.0000 mL | Freq: Three times a day (TID) | ORAL | Status: DC
  Administered 2019-09-09 – 2019-09-13 (×5): 237 mL via ORAL

## 2019-09-09 MED ORDER — POTASSIUM CHLORIDE CRYS ER 20 MEQ PO TBCR
40.0000 meq | EXTENDED_RELEASE_TABLET | ORAL | Status: AC
  Administered 2019-09-09 (×2): 40 meq via ORAL
  Filled 2019-09-09 (×2): qty 2

## 2019-09-09 MED ORDER — DOCUSATE SODIUM 100 MG PO CAPS
100.0000 mg | ORAL_CAPSULE | Freq: Two times a day (BID) | ORAL | Status: DC
  Administered 2019-09-09 – 2019-09-14 (×8): 100 mg via ORAL
  Filled 2019-09-09 (×10): qty 1

## 2019-09-09 MED ORDER — POLYETHYLENE GLYCOL 3350 17 G PO PACK
17.0000 g | PACK | Freq: Every day | ORAL | Status: DC
  Administered 2019-09-09 – 2019-09-14 (×4): 17 g via ORAL
  Filled 2019-09-09 (×5): qty 1

## 2019-09-09 MED ORDER — ADULT MULTIVITAMIN W/MINERALS CH
1.0000 | ORAL_TABLET | Freq: Every day | ORAL | Status: DC
  Administered 2019-09-09 – 2019-09-14 (×5): 1 via ORAL
  Filled 2019-09-09 (×5): qty 1

## 2019-09-09 NOTE — Progress Notes (Addendum)
Nutrition Follow-up  RD working remotely.  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  Ongoing  GOAL:   Patient will meet greater than or equal to 90% of their needs  Progressing   MONITOR:   PO intake, Supplement acceptance, Diet advancement, Labs, Weight trends  REASON FOR ASSESSMENT:   Malnutrition Screening Tool    ASSESSMENT:   78 year old pt admitted with right sided abdominal pain with PMH of HTN and cholecystitis. Pt found to have enlarged gallbladder and underwent laparoscopic cholecystectomy on 09/05/19.  11/9- s/p lap chole 11/11- s/p ERCP- exam suspicious for sludge, s/p spincterotomy with balloon pull throughs, stent placed into ventral pancreatic duct 11/12- advanced to a regular det  Reviewed I/O's: +2 L x 24 hours and +8.5 L since admission  UOP: 250 ml x 24 hours  Pt continues to complain of pain. She has very limited PO intake due to poor appetite secondary to pain. Noted meal completion 0-10%. Pt has been taking Boost Breeze supplements. Now that diet is advanced, will try Ensure Enlive supplements due to increased nutrient density.   Physical therapy recommending home health services at discharge.   Labs reviewed.   Diet Order:   Diet Order            Diet regular Room service appropriate? Yes; Fluid consistency: Thin  Diet effective now              EDUCATION NEEDS:   Not appropriate for education at this time  Skin:  Skin Assessment: Skin Integrity Issues: Skin Integrity  Issues:: Incisions Incisions: closed abdomen  Last BM:  09/04/19  Height:   Ht Readings from Last 1 Encounters:  09/07/19 6' (1.829 m)    Weight:   Wt Readings from Last 1 Encounters:  09/07/19 81.6 kg    Ideal Body Weight:  72.7 kg  BMI:  Body mass index is 24.4 kg/m.  Estimated Nutritional Needs:   Kcal:  2200-2400  Protein:  110-125  Fluid:  >2.0L    Syaire Saber A. Jimmye Norman, RD, LDN, [place] Registered Dietitian II Certified Diabetes Care and Education Specialist Pager: [phone] After hours Pager: [phone]

## 2019-09-09 NOTE — Progress Notes (Signed)
PT Cancellation Note  Patient Details Name: Samantha Gonzales MRN: [id number] DOB: May 01, 1941   Cancelled Treatment:    Reason Eval/Treat Not Completed: Patient declined, no reason specified. Pt reporting increased pain, maintaining eyes closed and not wishing to participate in therapy at this time. PT will continue to follow acutely as available.    [place] 09/09/2019, 9:18 AM

## 2019-09-09 NOTE — Progress Notes (Signed)
Pt has low grade temp 99.9 given tylenol, encourage to drink more fluids, on going IV fluid at 75cc/hr, her daughter at the bedside, no appetite, given PRN med for abdominal pain, for abd chest x-ray tom.

## 2019-09-09 NOTE — Progress Notes (Signed)
Samantha Gonzales 2:25 PM  Subjective: Patient doing and looking better today and case again discussed with her daughter and no new complaints and we talked about soreness from surgery and and we discussed walking and eating and she has not moved her bowels since surgery  Objective: Vital signs stable afebrile no acute distress patient does look better overall positive bowel sounds abdomen soft less tender on exam but mild wincing in all quadrants liver tests continue to decrease white count okay  Assessment: Persistent abdominal pain questionable etiology  Plan: Will order a flat and upright abdomen x-ray for tomorrow morning to reevaluate her pancreatic duct stent and possibly if it has already migrated she will need an x-ray as an outpatient and I will check on that this weekend and please call my partner Dr. Michail Sermon if any further question or problems and consider repeat abdominal CT scan if her pain continues through the weekend although I think it will probably not be revealing  [facility]  office [phone] After 5PM or if no answer call [phone]

## 2019-09-09 NOTE — Progress Notes (Signed)
2 Days Post-Op  Subjective: CC: Patient continues to complain of pain over her right abdomen. Hasn't eaten much, just drank water yesterday. No N/V. She is not passing flatus. No BM since 11/8. She worked with PT yesterday.   Objective: Vital signs in last 24 hours: Temp:  [98.5 F (36.9 C)-98.8 F (37.1 C)] 98.5 F (36.9 C) (11/13 0428) Pulse Rate:  [72-88] 88 (11/13 0428) Resp:  [16] 16 (11/13 0428) BP: (147-168)/(65-76) 156/76 (11/13 0428) SpO2:  [98 %-100 %] 98 % (11/13 0428) Last BM Date: 09/04/19  Intake/Output from previous day: 11/12 0701 - 11/13 0700 In: 2221.7 [P.O.:435; I.V.:1389.3; IV Piggyback:397.3] Out: 250 [Urine:250] Intake/Output this shift: Total I/O In: -  Out: 200 [Urine:200]  PE: Gen: Alert, NAD, pleasant Lungs: Normal rate and effort  Abd: Soft, ND,epigsatric tenderness and RUQ without peritonitis,+BS, Incisions with glue intact appears well and are without drainage, bleeding, or signs of infection Ext: No LE edema Skin: no rashes noted, warm and dry  Lab Results:  Recent Labs    09/08/19 0524 09/09/19 0305  WBC 14.3* 10.2  HGB 11.1* 11.4*  HCT 32.1* 33.6*  PLT 198 250   BMET Recent Labs    09/08/19 0524 09/09/19 0305  NA 137 138  K 3.5 2.8*  CL 103 103  CO2 24 25  GLUCOSE 145* 117*  BUN 13 10  CREATININE 1.07* 0.90  CALCIUM 8.8* 8.6*   PT/INR No results for input(s): LABPROT, INR in the last 72 hours. CMP     Component Value Date/Time   NA 138 09/09/2019 0305   K 2.8 (L) 09/09/2019 0305   CL 103 09/09/2019 0305   CO2 25 09/09/2019 0305   GLUCOSE 117 (H) 09/09/2019 0305   BUN 10 09/09/2019 0305   CREATININE 0.90 09/09/2019 0305   CALCIUM 8.6 (L) 09/09/2019 0305   PROT 5.6 (L) 09/09/2019 0305   ALBUMIN 2.1 (L) 09/09/2019 0305   AST 58 (H) 09/09/2019 0305   ALT 84 (H) 09/09/2019 0305   ALKPHOS 218 (H) 09/09/2019 0305   BILITOT 0.2 (L) 09/09/2019 0305   GFRNONAA >60 09/09/2019 0305   GFRAA >60 09/09/2019 0305    Lipase     Component Value Date/Time   LIPASE 20 09/08/2019 0524       Studies/Results: Dg Ercp Biliary & Pancreatic Ducts  Result Date: 09/07/2019 CLINICAL DATA:  Choledocholithiasis, sphincterotomy EXAM: ERCP TECHNIQUE: Multiple spot images obtained with the fluoroscopic device and submitted for interpretation post-procedure. COMPARISON:  Intra op cholangiogram 09/05/2019 FINDINGS: A series of fluoroscopic spot images document endoscopic cannulation and opacification of the CBD. Limited opacification of the intrahepatic biliary tree, incompletely visualized. Cholecystectomy clips. No evidence of cystic duct stump leak. No evidence of extravasation on the final image. IMPRESSION: Endoscopic CBD cannulation and intervention These images were submitted for radiologic interpretation only. Please see the procedural report for the amount of contrast and the fluoroscopy time utilized. Electronically Signed   By: Corlis Leak M.D.   On: 09/07/2019 13:27    Anti-infectives: Anti-infectives (From admission, onward)   Start     Dose/Rate Route Frequency Ordered Stop   09/08/19 0600  ciprofloxacin (CIPRO) IVPB 400 mg  Status:  Discontinued     400 mg 200 mL/hr over 60 Minutes Intravenous On call to O.R. 09/07/19 1311 09/07/19 1414   09/05/19 2300  ciprofloxacin (CIPRO) IVPB 400 mg     400 mg 200 mL/hr over 60 Minutes Intravenous Every 12 hours 09/05/19 1425 09/12/19 2259  09/04/19 2300  ciprofloxacin (CIPRO) IVPB 400 mg  Status:  Discontinued     400 mg 200 mL/hr over 60 Minutes Intravenous Every 12 hours 09/04/19 1314 09/05/19 1425   09/04/19 1115  ciprofloxacin (CIPRO) IVPB 400 mg     400 mg 200 mL/hr over 60 Minutes Intravenous  Once 09/04/19 1101 09/04/19 1433   09/04/19 1115  metroNIDAZOLE (FLAGYL) IVPB 500 mg     500 mg 100 mL/hr over 60 Minutes Intravenous  Once 09/04/19 1101 09/04/19 1334       Assessment/Plan HTN HLD  Acute Cholecystitis - POD #4 S/p Lap Chole with IOC -  Dr. Brantley Stage - 09/05/2019 - S/p ERCP with Dr. Watt Climes on 11/11. Will need flat and upright abdominal xray in 1-2 weeks to confirm passage of pancreatic stent per GI's note.  - Cont abxas below - Monitor WBC. Down today.  - Keep for pain control. Hopefully home over the weekend. - Ambulate. PT recommending [place].  - Pulm toilet  FEN -Diet regular, bowel regimen VTE -SCDs, Lovenox ID -Cipro 11/8 - 11/16 Follow-up: DOW    LOS: 4 days    Jillyn Ledger , [facility] 09/09/2019, 10:08 AM Please see Amion for pager number during day hours 7:00am-4:30pm

## 2019-09-10 ENCOUNTER — Inpatient Hospital Stay (HOSPITAL_COMMUNITY): Payer: Medicare Other

## 2019-09-10 LAB — CBC
HCT: 34.5 % — ABNORMAL LOW (ref 36.0–46.0)
Hemoglobin: 11.7 g/dL — ABNORMAL LOW (ref 12.0–15.0)
MCH: 33.1 pg (ref 26.0–34.0)
MCHC: 33.9 g/dL (ref 30.0–36.0)
MCV: 97.5 fL (ref 80.0–100.0)
Platelets: 258 10*3/uL (ref 150–400)
RBC: 3.54 MIL/uL — ABNORMAL LOW (ref 3.87–5.11)
RDW: 13.5 % (ref 11.5–15.5)
WBC: 17.3 10*3/uL — ABNORMAL HIGH (ref 4.0–10.5)
nRBC: 0 % (ref 0.0–0.2)

## 2019-09-10 MED ORDER — IOHEXOL 300 MG/ML  SOLN
100.0000 mL | Freq: Once | INTRAMUSCULAR | Status: AC | PRN
  Administered 2019-09-10: 100 mL via INTRAVENOUS

## 2019-09-10 NOTE — Plan of Care (Signed)

## 2019-09-10 NOTE — Progress Notes (Deleted)
AVS given and reviewed with pt. Medications discussed. All questions answered to satisfaction. Pt verbalized understanding of information given. Pt to be escorted off the unit with all belongings via wheelchair by staff member.  

## 2019-09-10 NOTE — Progress Notes (Signed)
3 Days Post-Op   Subjective/Chief Complaint: Pt still complaining of right sided abdominal pain, moderate to severe   Objective: Vital signs in last 24 hours: Temp:  [98 F (36.7 C)-100 F (37.8 C)] 98.7 F (37.1 C) (11/14 0602) Pulse Rate:  [78-91] 78 (11/14 0602) Resp:  [14-17] 17 (11/14 0602) BP: (129-156)/(58-79) 130/58 (11/14 0602) SpO2:  [98 %-100 %] 100 % (11/14 0602) Last BM Date: 09/04/19  Intake/Output from previous day: 11/13 0701 - 11/14 0700 In: 2512.8 [P.O.:420; I.V.:1795.8; IV Piggyback:297] Out: 1300 [Urine:1300] Intake/Output this shift: No intake/output data recorded.  Exam: Awake and alert Looks comfortable Abdomen mildly full, incisions clean  Lab Results:  Recent Labs    09/09/19 0305 09/10/19 0248  WBC 10.2 17.3*  HGB 11.4* 11.7*  HCT 33.6* 34.5*  PLT 250 258   BMET Recent Labs    09/08/19 0524 09/09/19 0305  NA 137 138  K 3.5 2.8*  CL 103 103  CO2 24 25  GLUCOSE 145* 117*  BUN 13 10  CREATININE 1.07* 0.90  CALCIUM 8.8* 8.6*   PT/INR No results for input(s): LABPROT, INR in the last 72 hours. ABG No results for input(s): PHART, HCO3 in the last 72 hours.  Invalid input(s): PCO2, PO2  Studies/Results: No results found.  Anti-infectives: Anti-infectives (From admission, onward)   Start     Dose/Rate Route Frequency Ordered Stop   09/08/19 0600  ciprofloxacin (CIPRO) IVPB 400 mg  Status:  Discontinued     400 mg 200 mL/hr over 60 Minutes Intravenous On call to O.R. 09/07/19 1311 09/07/19 1414   09/05/19 2300  ciprofloxacin (CIPRO) IVPB 400 mg     400 mg 200 mL/hr over 60 Minutes Intravenous Every 12 hours 09/05/19 1425 09/12/19 2259   09/04/19 2300  ciprofloxacin (CIPRO) IVPB 400 mg  Status:  Discontinued     400 mg 200 mL/hr over 60 Minutes Intravenous Every 12 hours 09/04/19 1314 09/05/19 1425   09/04/19 1115  ciprofloxacin (CIPRO) IVPB 400 mg     400 mg 200 mL/hr over 60 Minutes Intravenous  Once 09/04/19 1101  09/04/19 1433   09/04/19 1115  metroNIDAZOLE (FLAGYL) IVPB 500 mg     500 mg 100 mL/hr over 60 Minutes Intravenous  Once 09/04/19 1101 09/04/19 1334      Assessment/Plan: s/p Procedure(s): ENDOSCOPIC RETROGRADE CHOLANGIOPANCREATOGRAPHY (ERCP) (N/A) SPHINCTEROTOMY PANCREATIC STENT PLACEMENT REMOVAL OF STONES  Continued abdominal pain and increasing WBC  Will CT Abd/pelvis with contrast to look for intra-abd abscess, cause of pain and increasing WBC  LOS: 5 days    Samantha Gonzales 09/10/2019

## 2019-09-10 NOTE — Progress Notes (Signed)
PT Cancellation Note  Patient Details Name: Samantha Gonzales MRN: [id number] DOB: 14-Oct-1941   Cancelled Treatment:    Reason Eval/Treat Not Completed: Patient declined, no reason specified. Pt reporting too much pain to participate in PT at this time. Pt alerted secretary that pt is requesting pain meds ASAP. PT will continue to f/u with pt acutely as available.    [name] 09/10/2019, 1:14 PM

## 2019-09-11 ENCOUNTER — Inpatient Hospital Stay (HOSPITAL_COMMUNITY): Payer: Medicare Other

## 2019-09-11 ENCOUNTER — Telehealth (HOSPITAL_COMMUNITY): Payer: Self-pay | Admitting: *Deleted

## 2019-09-11 LAB — PROTIME-INR
INR: 1.2 (ref 0.8–1.2)
Prothrombin Time: 15.4 seconds — ABNORMAL HIGH (ref 11.4–15.2)

## 2019-09-11 LAB — CBC
HCT: 31.2 % — ABNORMAL LOW (ref 36.0–46.0)
Hemoglobin: 10.5 g/dL — ABNORMAL LOW (ref 12.0–15.0)
MCH: 32.8 pg (ref 26.0–34.0)
MCHC: 33.7 g/dL (ref 30.0–36.0)
MCV: 97.5 fL (ref 80.0–100.0)
Platelets: 246 10*3/uL (ref 150–400)
RBC: 3.2 MIL/uL — ABNORMAL LOW (ref 3.87–5.11)
RDW: 13.4 % (ref 11.5–15.5)
WBC: 12 10*3/uL — ABNORMAL HIGH (ref 4.0–10.5)
nRBC: 0 % (ref 0.0–0.2)

## 2019-09-11 MED ORDER — FENTANYL CITRATE (PF) 100 MCG/2ML IJ SOLN
INTRAMUSCULAR | Status: AC | PRN
  Administered 2019-09-11 (×2): 25 ug via INTRAVENOUS
  Administered 2019-09-11: 50 ug via INTRAVENOUS

## 2019-09-11 MED ORDER — FENTANYL CITRATE (PF) 100 MCG/2ML IJ SOLN
INTRAMUSCULAR | Status: AC
  Filled 2019-09-11: qty 4

## 2019-09-11 MED ORDER — MIDAZOLAM HCL 2 MG/2ML IJ SOLN
INTRAMUSCULAR | Status: AC
  Filled 2019-09-11: qty 2

## 2019-09-11 MED ORDER — MIDAZOLAM HCL 2 MG/2ML IJ SOLN
INTRAMUSCULAR | Status: AC | PRN
  Administered 2019-09-11: 1 mg via INTRAVENOUS
  Administered 2019-09-11 (×2): 0.5 mg via INTRAVENOUS

## 2019-09-11 MED ORDER — LIDOCAINE-EPINEPHRINE 1 %-1:100000 IJ SOLN
INTRAMUSCULAR | Status: AC
  Filled 2019-09-11: qty 1

## 2019-09-11 MED ORDER — MIDAZOLAM HCL 2 MG/2ML IJ SOLN
INTRAMUSCULAR | Status: AC
  Filled 2019-09-11: qty 4

## 2019-09-11 MED ORDER — FENTANYL CITRATE (PF) 100 MCG/2ML IJ SOLN
INTRAMUSCULAR | Status: AC
  Filled 2019-09-11: qty 2

## 2019-09-11 MED ORDER — HYDROMORPHONE HCL 1 MG/ML IJ SOLN
INTRAMUSCULAR | Status: AC
  Filled 2019-09-11: qty 1

## 2019-09-11 MED ORDER — FENTANYL CITRATE (PF) 100 MCG/2ML IJ SOLN
INTRAMUSCULAR | Status: AC | PRN
  Administered 2019-09-11: 50 ug via INTRAVENOUS
  Administered 2019-09-11 (×2): 25 ug via INTRAVENOUS

## 2019-09-11 NOTE — Plan of Care (Signed)
  Problem: Education: Goal: Knowledge of General Education information will improve Description: Including pain rating scale, medication(s)/side effects and non-pharmacologic comfort measures Outcome: Progressing   Problem: Health Behavior/Discharge Planning: Goal: Ability to manage health-related needs will improve Outcome: Progressing   Problem: Clinical Measurements: Goal: Ability to maintain clinical measurements within normal limits will improve Outcome: Progressing Goal: Will remain free from infection Outcome: Progressing Goal: Diagnostic test results will improve Outcome: Progressing   Problem: Activity: Goal: Risk for activity intolerance will decrease Outcome: Progressing   Problem: Elimination: Goal: Will not experience complications related to urinary retention Outcome: Progressing   Problem: Pain Managment: Goal: General experience of comfort will improve Outcome: Progressing   Problem: Safety: Goal: Ability to remain free from injury will improve Outcome: Progressing   Problem: Skin Integrity: Goal: Risk for impaired skin integrity will decrease Outcome: Progressing

## 2019-09-11 NOTE — Procedures (Signed)
Pre procedural Dx: Post up fluid collections Post procedural Dx: Same  Technically successful CT guided placed of a 10 Fr drainage catheter placement into the lower pelvis via right transgluteal approach yielding 15 cc of purulent appearing fluid. Technically successful CT guided placed of a 10 Fr drainage catheter placement gall bladder fossa fluid collection yielding 10 cc of bloody fluid.  A sample from the TG drain was sent to the laboratory for analysis.    EBL: None Complications: None immediate  Ronny Bacon, MD Pager #: [phone]

## 2019-09-11 NOTE — Consult Note (Signed)
Chief Complaint: Patient was seen in consultation today for intraabdominal abscess x2/drain placement x2.  Referring Physician(s): Berna Bue  Supervising Physician: Simonne Come  Patient Status: [facility] - In-pt  History of Present Illness: Samantha Gonzales is a 78 y.o. female with a past medical history of hypertension. She presented to [facility] ED 09/04/2019 with complaint of right-sided abdominal pain. In ED, CT abdomen/pelvis revealed an enlarged gallbladder with some wall thickening along with intrahepatic/extrahepatic biliary ductal dilation. She was admitted for further management. She underwent a laparoscopic cholecystectomy in OR 09/05/2019 by Dr. Luisa Hart. In addition, underwent ERCP with CBD cannulation and intervention 09/07/2019 by Dr. Ewing Schlein. Following this surgery and procedure, patient noted to have leukocytosis. CT abdomen/pelvis was ordered for further evaluation.  CT abdomen/pelvis 09/10/2019: 1. The previously placed pancreatic duct stent has migrated distally in the left-sided small bowel. 2. There is a 5.5 by 2.8 by 4.0 cm fluid collection along the cholecystectomy site with surrounding clips but no gas in this collection. This could represent a postoperative seroma, hematoma, or abscess. 3. 100 cubic cm collection of free fluid in the pelvis potentially with mild enhancing margins. There is no gas within this fluid collection but strictly speaking, abscess cannot be excluded. There is also a small right paracentral presacral fluid collection which is likewise indeterminate for infection. 4. Proximal small bowel dilatation with transition point in the right central pelvis between mildly dilated and nondilated small bowel. However, there is dense contrast medium in the colon and accordingly I am skeptical of a high-grade small bowel obstruction. Given the angulation of the transition point, the possibility of a small adhesion causing low-grade/partial small bowel obstruction is  raised. 5. Other imaging findings of potential clinical significance: Coronary atherosclerosis. Mild atelectasis in both lower lobes. Hepatic and renal cysts. Mild prominence of left parametrial venous structures, query pelvic congestion syndrome. Pneumobilia compatible with prior sphincterotomy.  IR consulted by Dr. Fredricka Bonine for possible image-guided intraabdominal abscess drain placement. Patient awake and alert laying in bed watching TV. Complains of right-sided abdominal pain, rated 9/10 at this time. Denies fever, chills, chest pain, dyspnea, or headache.   Past Medical History:  Diagnosis Date   Hypertension     Past Surgical History:  Procedure Laterality Date   CHOLECYSTECTOMY  09/05/2019   CHOLECYSTECTOMY N/A 09/05/2019   Procedure: LAPAROSCOPIC CHOLECYSTECTOMY WITH INTRAOPERATIVE CHOLANGIOGRAM;  Surgeon: Harriette Bouillon, MD;  Location: [facility] OR;  Service: General;  Laterality: N/A;   ERCP N/A 09/07/2019   Procedure: ENDOSCOPIC RETROGRADE CHOLANGIOPANCREATOGRAPHY (ERCP);  Surgeon: Vida Rigger, MD;  Location: [facility] ENDOSCOPY;  Service: Endoscopy;  Laterality: N/A;   PANCREATIC STENT PLACEMENT  09/07/2019   Procedure: PANCREATIC STENT PLACEMENT;  Surgeon: Vida Rigger, MD;  Location: [facility] ENDOSCOPY;  Service: Endoscopy;;   REMOVAL OF STONES  09/07/2019   Procedure: REMOVAL OF STONES;  Surgeon: Vida Rigger, MD;  Location: [facility] ENDOSCOPY;  Service: Endoscopy;;   SPHINCTEROTOMY  09/07/2019   Procedure: Dennison Mascot;  Surgeon: Vida Rigger, MD;  Location: [facility] ENDOSCOPY;  Service: Endoscopy;;   TUBAL LIGATION      Allergies: Penicillins  Medications: Prior to Admission medications   Medication Sig Start Date End Date Taking? Authorizing Provider  acetaminophen (TYLENOL) 325 MG tablet Take 650 mg by mouth every 6 (six) hours as needed for mild pain or moderate pain.   Yes [provider]  amLODipine (NORVASC) 5 MG tablet Take 1 tablet (5 mg total) by mouth daily. Patient  taking differently: Take 10 mg by mouth  daily.  01/21/18  Yes Vann, Selinda Orion, DO  HYDROcodone-acetaminophen (NORCO) 7.5-325 MG tablet Take 1 tablet by mouth 3 (three) times daily as needed for pain. 12/24/17  Yes [provider]  lactulose (CHRONULAC) 10 GM/15ML solution Take 30 mLs (20 g total) by mouth 2 (two) times daily as needed for mild constipation. For daily bowel movements 01/20/18  Yes Vann, Jessica U, DO  rosuvastatin (CRESTOR) 20 MG tablet Take 20 mg by mouth daily. 07/30/18  Yes [provider]  Cyanocobalamin (B-12) 1000 MCG SUBL Place 1,000 mcg under the tongue daily. Patient not taking: Reported on 09/04/2019 01/20/18   Joseph Art, DO  pantoprazole (PROTONIX) 40 MG tablet Take 1 tablet (40 mg total) by mouth 2 (two) times daily. Patient not taking: Reported on 09/04/2019 01/20/18   Joseph Art, DO  thiamine 100 MG tablet Take 1 tablet (100 mg total) by mouth daily. Patient not taking: Reported on 09/04/2019 01/21/18   Joseph Art, DO     History reviewed. No pertinent family history.  Social History   Socioeconomic History   Marital status: Widowed    Spouse name: Not on file   Number of children: Not on file   Years of education: Not on file   Highest education level: Not on file  Occupational History   Not on file  Social Needs   Financial resource strain: Not on file   Food insecurity    Worry: Not on file    Inability: Not on file   Transportation needs    Medical: Not on file    Non-medical: Not on file  Tobacco Use   Smoking status: Never Smoker   Smokeless tobacco: Current User    Types: Chew  Substance and Sexual Activity   Alcohol use: No   Drug use: No   Sexual activity: Not Currently  Lifestyle   Physical activity    Days per week: Not on file    Minutes per session: Not on file   Stress: Not on file  Relationships   Social connections    Talks on phone: Not on file    Gets together: Not on file     Attends religious service: Not on file    Active member of club or organization: Not on file    Attends meetings of clubs or organizations: Not on file    Relationship status: Not on file  Other Topics Concern   Not on file  Social History Narrative   Not on file     Review of Systems: A 12 point ROS discussed and pertinent positives are indicated in the HPI above.  All other systems are negative.  Review of Systems  Constitutional: Negative for chills and fever.  Respiratory: Negative for shortness of breath and wheezing.   Cardiovascular: Negative for chest pain and palpitations.  Gastrointestinal: Positive for abdominal pain.  Neurological: Negative for headaches.  Psychiatric/Behavioral: Negative for behavioral problems and confusion.    Vital Signs: BP (!) 148/59 (BP Location: Right Arm)    Pulse 85    Temp 98.8 F (37.1 C) (Oral)    Resp 18    Ht 6' (1.829 m) Comment: per 01/21/18 encounter   Wt 179 lb 14.3 oz (81.6 kg) Comment: from 01/21/18 encounter   SpO2 99%    BMI 24.40 kg/m   Physical Exam Vitals signs and nursing note reviewed.  Constitutional:      General: She is not in acute distress.  Appearance: Normal appearance.  Cardiovascular:     Rate and Rhythm: Normal rate and regular rhythm.     Heart sounds: Normal heart sounds. No murmur.  Pulmonary:     Effort: Pulmonary effort is normal. No respiratory distress.     Breath sounds: Normal breath sounds. No wheezing.  Abdominal:     Palpations: Abdomen is soft.     Tenderness: There is abdominal tenderness.  Skin:    General: Skin is warm and dry.  Neurological:     Mental Status: She is alert and oriented to person, place, and time.      MD Evaluation Airway: WNL Heart: WNL Abdomen: WNL Chest/ Lungs: WNL ASA  Classification: 2 Mallampati/Airway Score: Two   Imaging: Dg Cholangiogram Operative  Result Date: 09/05/2019 CLINICAL DATA:  78 year old female with cholelithiasis EXAM: INTRAOPERATIVE  CHOLANGIOGRAM TECHNIQUE: Cholangiographic images from the C-arm fluoroscopic device were submitted for interpretation post-operatively. Please see the procedural report for the amount of contrast and the fluoroscopy time utilized. COMPARISON:  None. FINDINGS: Surgical instruments project over the upper abdomen. There is cannulation of the cystic duct/gallbladder neck, with antegrade infusion of contrast. Enlarged intrahepatic and extrahepatic biliary system. No filling defect identified. There is abrupt cutoff of the common bile duct. IMPRESSION: Intraoperative cholangiogram demonstrates enlarged biliary system of the intrahepatic and extrahepatic ducts, with abrupt cutoff of the common bile duct. Given the appearance the differential includes underlying stricture, obstructing mass, and/or retained stones and correlation with ERCP or potentially MRCP may be useful. Please refer to the dictated operative report for full details of intraoperative findings and procedure Electronically Signed   By: Corrie Mckusick D.O.   On: 09/05/2019 11:47   Ct Abdomen Pelvis W Contrast  Result Date: 09/10/2019 CLINICAL DATA:  Leukocytosis. Recent laparoscopic cholecystectomy and ERCP. EXAM: CT ABDOMEN AND PELVIS WITH CONTRAST TECHNIQUE: Multidetector CT imaging of the abdomen and pelvis was performed using the standard protocol following bolus administration of intravenous contrast. CONTRAST:  129mL OMNIPAQUE IOHEXOL 300 MG/ML  SOLN COMPARISON:  CT abdomen from 09/04/2019. I also reviewed the patient's operative note and ERCP procedure note. FINDINGS: Lower chest: Mild atelectasis in both lower lobes. Coronary and aortic atherosclerotic vascular calcifications. Hepatobiliary: Bilobed cyst of the right hepatic lobe 2.8 by 2.1 cm on image 19/6. Other small hypodense lesions of the liver technically too small to characterize although may well represent small cysts. There is a 5.5 by 2.8 4.0 cm fluid collection along the  cholecystectomy site with surrounding clips noted. No gas is present in this collection. There is a small amount of gas in the common bile duct compatible with sphincterotomy. No significant abnormal degree of enhancement along the extrahepatic biliary tree noted. Minimal intrahepatic biliary dilatation. Pancreas: Small amount of gas is present in the dorsal pancreatic duct which is borderline dilated although less so compared to 09/04/2019. The previously placed pancreatic duct stent has migrated distally appears to currently sit in the left-sided small bowel on image 50/9. Spleen: Unremarkable Adrenals/Urinary Tract: Hypodense bilateral renal lesions are compatible with cysts. Adrenal glands unremarkable. Stomach/Bowel: Scattered moderately dilated loops of small bowel extending down to a transition point in the central pelvis shown on images 69-72 of series 6, beyond which the small bowel is normal in caliber. Orally administered contrast is present in the colon but also there is some progressive dilution of contrast in the small bowel loops. Vascular/Lymphatic: Aortoiliac atherosclerotic vascular disease. Mild prominence of left parametrial venous structures. Reproductive: Unremarkable Other: There is a  collection of free fluid in the pelvis potentially with mild enhancing margins, measuring 7.4 by 4.1 by 6.4 cm (volume = 100 cm^3). There is no gas within this fluid collection but strictly speaking, abscess cannot be excluded. A smaller there is a small right paracentral presacral fluid collection which is likewise indeterminate for infection shown on image 70/6. Trace free fluid in the right paracolic gutter. No significant extraluminal gas. Musculoskeletal: Unremarkable IMPRESSION: 1. The previously placed pancreatic duct stent has migrated distally in the left-sided small bowel. 2. There is a 5.5 by 2.8 by 4.0 cm fluid collection along the cholecystectomy site with surrounding clips but no gas in this  collection. This could represent a postoperative seroma, hematoma, or abscess. 3. 100 cubic cm collection of free fluid in the pelvis potentially with mild enhancing margins. There is no gas within this fluid collection but strictly speaking, abscess cannot be excluded. There is also a small right paracentral presacral fluid collection which is likewise indeterminate for infection. 4. Proximal small bowel dilatation with transition point in the right central pelvis between mildly dilated and nondilated small bowel. However, there is dense contrast medium in the colon and accordingly I am skeptical of a high-grade small bowel obstruction. Given the angulation of the transition point, the possibility of a small adhesion causing low-grade/partial small bowel obstruction is raised. 5. Other imaging findings of potential clinical significance: Coronary atherosclerosis. Mild atelectasis in both lower lobes. Hepatic and renal cysts. Mild prominence of left parametrial venous structures, query pelvic congestion syndrome. Pneumobilia compatible with prior sphincterotomy. Aortic Atherosclerosis (ICD10-I70.0). Electronically Signed   By: Gaylyn RongWalter  Liebkemann M.D.   On: 09/10/2019 18:21   Ct Abdomen Pelvis W Contrast  Result Date: 09/04/2019 CLINICAL DATA:  Severe right lower quadrant abdominal pain. EXAM: CT ABDOMEN AND PELVIS WITH CONTRAST TECHNIQUE: Multidetector CT imaging of the abdomen and pelvis was performed using the standard protocol following bolus administration of intravenous contrast. CONTRAST:  100mL OMNIPAQUE IOHEXOL 300 MG/ML  SOLN COMPARISON:  CT abdomen pelvis 09/28/2015; CT abdomen pelvis 02/20/2011 FINDINGS: Lower chest: Normal heart size. Dependent atelectasis bilateral lower lobes. No pleural effusion. Hepatobiliary: Liver is normal in size and contour. Stable cyst within the central aspect of the liver. Persistent intrahepatic and extrahepatic biliary ductal dilatation. Common bile duct measures 9 mm  distally (image 39; series 3). The gallbladder is markedly distended measuring up to 18 cm in craniocaudal dimension. There is mild wall thickening of the mid aspect of the gallbladder. Sludge within the gallbladder lumen. Pancreas: Similar-appearing prominence of the pancreatic duct. No inflammatory change. Spleen: Unremarkable Adrenals/Urinary Tract: Normal adrenal glands. Bilateral renal cyst. Kidneys enhance symmetrically with contrast. No hydronephrosis. Urinary bladder is unremarkable. Stomach/Bowel: No abnormal bowel wall thickening or evidence for bowel obstruction. Normal appendix. No free fluid or free intraperitoneal air. Vascular/Lymphatic: Normal caliber abdominal aorta. Peripheral calcified atherosclerotic plaque. No retroperitoneal lymphadenopathy. Reproductive: Reflux of contrast into the left gonadal veins. Other: None. Musculoskeletal: Left hip joint degenerative changes. Lumbar spine degenerative changes. No aggressive or acute appearing osseous lesions. IMPRESSION: 1. The gallbladder is hydropic and markedly distended measuring up to 18 cm, extending caudally into the lower abdomen. There is some wall thickening of the mid aspect of the gallbladder. Findings raise the possibility of associated superimposed acute cholecystitis. 2. There is persistent intrahepatic and extrahepatic biliary ductal dilatation which is similar when compared to CT 02/14/2011. 3. These results were called by telephone at the time of interpretation on 09/04/2019 at 11:11 am to provider  CHRISTOPHER TEGELER , who verbally acknowledged these results. Electronically Signed   By: Annia Belt M.D.   On: 09/04/2019 11:14   Dg Ercp Biliary & Pancreatic Ducts  Result Date: 09/07/2019 CLINICAL DATA:  Choledocholithiasis, sphincterotomy EXAM: ERCP TECHNIQUE: Multiple spot images obtained with the fluoroscopic device and submitted for interpretation post-procedure. COMPARISON:  Intra op cholangiogram 09/05/2019 FINDINGS: A series  of fluoroscopic spot images document endoscopic cannulation and opacification of the CBD. Limited opacification of the intrahepatic biliary tree, incompletely visualized. Cholecystectomy clips. No evidence of cystic duct stump leak. No evidence of extravasation on the final image. IMPRESSION: Endoscopic CBD cannulation and intervention These images were submitted for radiologic interpretation only. Please see the procedural report for the amount of contrast and the fluoroscopy time utilized. Electronically Signed   By: Corlis Leak M.D.   On: 09/07/2019 13:27   Dg Abd 2 Views  Result Date: 09/10/2019 CLINICAL DATA:  Follow-up pancreatic duct stent. EXAM: ABDOMEN - 2 VIEW COMPARISON:  Abdomen and pelvis CT dated 09/04/2019. ERCP dated 09/07/2019. Operative cholangiogram dated 09/05/2019. FINDINGS: Normal bowel gas pattern. Small caliber stent with a distal pigtail located right mid to lower abdomen. This is in the region of the distal common duct and pancreatic duct on the previous CT. Interval cholecystectomy clips. Lumbar and lower thoracic spine degenerative changes. IMPRESSION: Small caliber stent in the right mid to lower abdomen. This is in the region of the distal common duct and pancreatic duct on the previous CT. Electronically Signed   By: Beckie Salts M.D.   On: 09/10/2019 10:52    Labs:  CBC: Recent Labs    09/08/19 0524 09/09/19 0305 09/10/19 0248 09/11/19 0251  WBC 14.3* 10.2 17.3* 12.0*  HGB 11.1* 11.4* 11.7* 10.5*  HCT 32.1* 33.6* 34.5* 31.2*  PLT 198 250 258 246    COAGS: No results for input(s): INR, APTT in the last 8760 hours.  BMP: Recent Labs    09/06/19 0253 09/07/19 0249 09/08/19 0524 09/09/19 0305  NA 138 135 137 138  K 3.6 3.2* 3.5 2.8*  CL 104 101 103 103  CO2 GLUCOSE 172* 130* 145* 117*  BUN 7* CALCIUM 9.3 8.8* 8.8* 8.6*  CREATININE 1.08* 1.03* 1.07* 0.90  GFRNONAA 50* 53* 50* >60  GFRAA 58* >60 58* >60    LIVER FUNCTION  TESTS: Recent Labs    09/06/19 1718 09/07/19 0249 09/08/19 0524 09/09/19 0305  BILITOT 0.7 0.7 0.5 0.2*  AST 144* 103* 69* 58*  ALT 175* 140* 101* 84*  ALKPHOS 380* 329* 261* 218*  PROT 6.3* 5.7* 5.7* 5.6*  ALBUMIN 2.4* 2.1* 2.1* 2.1*     Assessment and Plan:  Intraabdominal abscess x2. Plan for image-guided percutaneous intraabdominal abscess drain placement x2 tentatively for today in IR. Patient is NPO. Afebrile. Last dose Lovenox 09/10/2019 at 1140- ok to proceed per IR protocol. INR pending.  Risks and benefits discussed with the patient including bleeding, infection, damage to adjacent structures, bowel perforation/fistula connection, and sepsis. All of the patient's questions were answered, patient is agreeable to proceed. Consent signed and in chart.   Thank you for this interesting consult.  I greatly enjoyed meeting Samantha Gonzales and look forward to participating in their care.  A copy of this report was sent to the requesting provider on this date.  Electronically Signed: Elwin Mocha, PA-C 09/11/2019, 9:51 AM   I spent a total of 40 Minutes in face to face  in clinical consultation, greater than 50% of which was counseling/coordinating care for intraabdominal abscess x2/drain placement x2.

## 2019-09-11 NOTE — Progress Notes (Signed)
4 Days Post-Op   Subjective/Chief Complaint: Still with Right sided abdominal pain   Objective: Vital signs in last 24 hours: Temp:  [98.2 F (36.8 C)-99.6 F (37.6 C)] 98.7 F (37.1 C) (11/15 0423) Pulse Rate:  [74-94] 79 (11/15 0423) Resp:  [16-18] 18 (11/15 0423) BP: (124-157)/(44-67) 124/44 (11/15 0423) SpO2:  [98 %-100 %] 100 % (11/15 0423) Last BM Date: 09/09/19  Intake/Output from previous day: 11/14 0701 - 11/15 0700 In: 2519.5 [P.O.:120; I.V.:1999.5; IV Piggyback:400] Out: 2575 [Urine:2575] Intake/Output this shift: No intake/output data recorded.  Exam: Awake and alert Looks comfortable Abdomen soft, non-distended, mild right sided tenderness  Lab Results:  Recent Labs    09/10/19 0248 09/11/19 0251  WBC 17.3* 12.0*  HGB 11.7* 10.5*  HCT 34.5* 31.2*  PLT 258 246   BMET Recent Labs    09/09/19 0305  NA 138  K 2.8*  CL 103  CO2 25  GLUCOSE 117*  BUN 10  CREATININE 0.90  CALCIUM 8.6*   PT/INR No results for input(s): LABPROT, INR in the last 72 hours. ABG No results for input(s): PHART, HCO3 in the last 72 hours.  Invalid input(s): PCO2, PO2  Studies/Results: Ct Abdomen Pelvis W Contrast  Result Date: 09/10/2019 CLINICAL DATA:  Leukocytosis. Recent laparoscopic cholecystectomy and ERCP. EXAM: CT ABDOMEN AND PELVIS WITH CONTRAST TECHNIQUE: Multidetector CT imaging of the abdomen and pelvis was performed using the standard protocol following bolus administration of intravenous contrast. CONTRAST:  171mL OMNIPAQUE IOHEXOL 300 MG/ML  SOLN COMPARISON:  CT abdomen from 09/04/2019. I also reviewed the patient's operative note and ERCP procedure note. FINDINGS: Lower chest: Mild atelectasis in both lower lobes. Coronary and aortic atherosclerotic vascular calcifications. Hepatobiliary: Bilobed cyst of the right hepatic lobe 2.8 by 2.1 cm on image 19/6. Other small hypodense lesions of the liver technically too small to characterize although may well  represent small cysts. There is a 5.5 by 2.8 4.0 cm fluid collection along the cholecystectomy site with surrounding clips noted. No gas is present in this collection. There is a small amount of gas in the common bile duct compatible with sphincterotomy. No significant abnormal degree of enhancement along the extrahepatic biliary tree noted. Minimal intrahepatic biliary dilatation. Pancreas: Small amount of gas is present in the dorsal pancreatic duct which is borderline dilated although less so compared to 09/04/2019. The previously placed pancreatic duct stent has migrated distally appears to currently sit in the left-sided small bowel on image 50/9. Spleen: Unremarkable Adrenals/Urinary Tract: Hypodense bilateral renal lesions are compatible with cysts. Adrenal glands unremarkable. Stomach/Bowel: Scattered moderately dilated loops of small bowel extending down to a transition point in the central pelvis shown on images 69-72 of series 6, beyond which the small bowel is normal in caliber. Orally administered contrast is present in the colon but also there is some progressive dilution of contrast in the small bowel loops. Vascular/Lymphatic: Aortoiliac atherosclerotic vascular disease. Mild prominence of left parametrial venous structures. Reproductive: Unremarkable Other: There is a collection of free fluid in the pelvis potentially with mild enhancing margins, measuring 7.4 by 4.1 by 6.4 cm (volume = 100 cm^3). There is no gas within this fluid collection but strictly speaking, abscess cannot be excluded. A smaller there is a small right paracentral presacral fluid collection which is likewise indeterminate for infection shown on image 70/6. Trace free fluid in the right paracolic gutter. No significant extraluminal gas. Musculoskeletal: Unremarkable IMPRESSION: 1. The previously placed pancreatic duct stent has migrated distally in the left-sided  small bowel. 2. There is a 5.5 by 2.8 by 4.0 cm fluid collection  along the cholecystectomy site with surrounding clips but no gas in this collection. This could represent a postoperative seroma, hematoma, or abscess. 3. 100 cubic cm collection of free fluid in the pelvis potentially with mild enhancing margins. There is no gas within this fluid collection but strictly speaking, abscess cannot be excluded. There is also a small right paracentral presacral fluid collection which is likewise indeterminate for infection. 4. Proximal small bowel dilatation with transition point in the right central pelvis between mildly dilated and nondilated small bowel. However, there is dense contrast medium in the colon and accordingly I am skeptical of a high-grade small bowel obstruction. Given the angulation of the transition point, the possibility of a small adhesion causing low-grade/partial small bowel obstruction is raised. 5. Other imaging findings of potential clinical significance: Coronary atherosclerosis. Mild atelectasis in both lower lobes. Hepatic and renal cysts. Mild prominence of left parametrial venous structures, query pelvic congestion syndrome. Pneumobilia compatible with prior sphincterotomy. Aortic Atherosclerosis (ICD10-I70.0). Electronically Signed   By: Gaylyn Rong M.D.   On: 09/10/2019 18:21   Dg Abd 2 Views  Result Date: 09/10/2019 CLINICAL DATA:  Follow-up pancreatic duct stent. EXAM: ABDOMEN - 2 VIEW COMPARISON:  Abdomen and pelvis CT dated 09/04/2019. ERCP dated 09/07/2019. Operative cholangiogram dated 09/05/2019. FINDINGS: Normal bowel gas pattern. Small caliber stent with a distal pigtail located right mid to lower abdomen. This is in the region of the distal common duct and pancreatic duct on the previous CT. Interval cholecystectomy clips. Lumbar and lower thoracic spine degenerative changes. IMPRESSION: Small caliber stent in the right mid to lower abdomen. This is in the region of the distal common duct and pancreatic duct on the previous CT.  Electronically Signed   By: Beckie Salts M.D.   On: 09/10/2019 10:52    Anti-infectives: Anti-infectives (From admission, onward)   Start     Dose/Rate Route Frequency Ordered Stop   09/08/19 0600  ciprofloxacin (CIPRO) IVPB 400 mg  Status:  Discontinued     400 mg 200 mL/hr over 60 Minutes Intravenous On call to O.R. 09/07/19 1311 09/07/19 1414   09/05/19 2300  ciprofloxacin (CIPRO) IVPB 400 mg     400 mg 200 mL/hr over 60 Minutes Intravenous Every 12 hours 09/05/19 1425 09/12/19 2259   09/04/19 2300  ciprofloxacin (CIPRO) IVPB 400 mg  Status:  Discontinued     400 mg 200 mL/hr over 60 Minutes Intravenous Every 12 hours 09/04/19 1314 09/05/19 1425   09/04/19 1115  ciprofloxacin (CIPRO) IVPB 400 mg     400 mg 200 mL/hr over 60 Minutes Intravenous  Once 09/04/19 1101 09/04/19 1433   09/04/19 1115  metroNIDAZOLE (FLAGYL) IVPB 500 mg     500 mg 100 mL/hr over 60 Minutes Intravenous  Once 09/04/19 1101 09/04/19 1334      Assessment/Plan: s/p Procedure(s): ENDOSCOPIC RETROGRADE CHOLANGIOPANCREATOGRAPHY (ERCP) (N/A) SPHINCTEROTOMY PANCREATIC STENT PLACEMENT REMOVAL OF STONES  CT reviewed.  Possible GB fossa abscess or post op fluid. Clinically, she does not look like a bile leak. IR has been asked to see for opinion regarding possible drain given WBC (which is back down today) Will repeat labs in the morning  LOS: 6 days    Abigail Miyamoto 09/11/2019

## 2019-09-11 NOTE — Progress Notes (Signed)
Pt continuously asking for pain medication. Pt was put NPO all night.

## 2019-09-12 LAB — COMPREHENSIVE METABOLIC PANEL
ALT: 40 U/L (ref 0–44)
AST: 27 U/L (ref 15–41)
Albumin: 2.2 g/dL — ABNORMAL LOW (ref 3.5–5.0)
Alkaline Phosphatase: 154 U/L — ABNORMAL HIGH (ref 38–126)
Anion gap: 15 (ref 5–15)
BUN: <5 mg/dL — ABNORMAL LOW (ref 8–23)
CO2: 24 mmol/L (ref 22–32)
Calcium: 8.8 mg/dL — ABNORMAL LOW (ref 8.9–10.3)
Chloride: 101 mmol/L (ref 98–111)
Creatinine, Ser: 0.74 mg/dL (ref 0.44–1.00)
GFR calc Af Amer: >60 mL/min (ref >60)
GFR calc non Af Amer: >60 mL/min (ref >60)
Glucose, Bld: 87 mg/dL (ref 70–99)
Potassium: 3.4 mmol/L — ABNORMAL LOW (ref 3.5–5.1)
Sodium: 140 mmol/L (ref 135–145)
Total Bilirubin: 0.6 mg/dL (ref 0.3–1.2)
Total Protein: 5.5 g/dL — ABNORMAL LOW (ref 6.5–8.1)

## 2019-09-12 LAB — CBC
HCT: 33.9 % — ABNORMAL LOW (ref 36.0–46.0)
Hemoglobin: 11.4 g/dL — ABNORMAL LOW (ref 12.0–15.0)
MCH: 33 pg (ref 26.0–34.0)
MCHC: 33.6 g/dL (ref 30.0–36.0)
MCV: 98.3 fL (ref 80.0–100.0)
Platelets: 284 10*3/uL (ref 150–400)
RBC: 3.45 MIL/uL — ABNORMAL LOW (ref 3.87–5.11)
RDW: 13.7 % (ref 11.5–15.5)
WBC: 10.6 10*3/uL — ABNORMAL HIGH (ref 4.0–10.5)
nRBC: 0 % (ref 0.0–0.2)

## 2019-09-12 NOTE — Progress Notes (Signed)
Referring Physician(s): MD - Twana First   Supervising Physician: Simonne Come  Patient Status:  [facility] - In-pt  Chief Complaint:  Right sided abdominal pain found to have acute cholecystitis.    Subjective:  78 y.o female. History of HTN presented to [facility] with right sided abdominal pain. 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 2. There is a 5.5 by 2.8 by 4.0 cm fluid collection along the cholecystectomy site with surrounding clips but no gas in this collection. This could represent a postoperative seroma, hematoma, or abscess. 3. 100 cubic cm collection of free fluid in the pelvis potentially with mild enhancing margins. There is no gas within this fluid collection but strictly speaking, abscess cannot be excluded. There is also a small right paracentral presacral fluid collection which is likewise indeterminate for infection  IR placed a 10 Fr APDL in the pelvic fluid collection and a 10 Fr APDL on the gallbladder fossa on 11.15.20.   Allergies: Penicillins  Medications: Prior to Admission medications   Medication Sig Start Date End Date Taking? Authorizing Provider  acetaminophen (TYLENOL) 325 MG tablet Take 650 mg by mouth every 6 (six) hours as needed for mild pain or moderate pain.   Yes [provider]  amLODipine (NORVASC) 5 MG tablet Take 1 tablet (5 mg total) by mouth daily. Patient taking differently: Take 10 mg by mouth daily.  01/21/18  Yes Joseph Art, DO  HYDROcodone-acetaminophen (NORCO) 7.5-325 MG tablet Take 1 tablet by mouth 3 (three) times daily as needed for pain. 12/24/17  Yes [provider]  lactulose (CHRONULAC) 10 GM/15ML solution Take 30 mLs (20 g total) by  mouth 2 (two) times daily as needed for mild constipation. For daily bowel movements 01/20/18  Yes Vann, Jessica U, DO  rosuvastatin (CRESTOR) 20 MG tablet Take 20 mg by mouth daily. 07/30/18  Yes [provider]  Cyanocobalamin (B-12) 1000 MCG SUBL Place 1,000 mcg under the tongue daily. Patient not taking: Reported on 09/04/2019 01/20/18   Joseph Art, DO  pantoprazole (PROTONIX) 40 MG tablet Take 1 tablet (40 mg total) by mouth 2 (two) times daily. Patient not taking: Reported on 09/04/2019 01/20/18   Joseph Art, DO  thiamine 100 MG tablet Take 1 tablet (100 mg total) by mouth daily. Patient not taking: Reported on 09/04/2019 01/21/18   Joseph Art, DO     Vital Signs: BP (!) 154/75 Comment: recheck   Pulse 83    Temp 98.9 F (37.2 C) (Oral)    Resp 16    Ht 6' (1.829 m) Comment: per 01/21/18 encounter   Wt 179 lb 14.3 oz (81.6 kg) Comment: from 01/21/18 encounter   SpO2 100%    BMI 24.40 kg/m   Physical Exam Vitals signs and nursing note reviewed.  Constitutional:      General: She is not in acute distress. Cardiovascular:     Rate and Rhythm: Normal rate.  Pulmonary:     Effort: Pulmonary effort is normal. No respiratory distress.  Abdominal:     Comments: Positive RUQ drain to suction serosanguinous output. scant amount of fluid noted. Sat loc and suture intact.  Dressing is clean dry and intact. No eryethema, edema, drainage or bleeding noted at  exit site.   Positive transgluteal drain to suction cloudy serous output. Scant amount of fluid noted. Sat loc and suture intact.  Dressing is clean dry and intact. No erythema, edema, drainage or bleeding noted at exit site.   Skin:    General: Skin is warm and dry.  Neurological:     Mental Status: She is alert. Mental status is at baseline.    Vital signs reviewed. Patient is awake and alert laying   Imaging: Ct Abdomen Pelvis W Contrast  Result Date: 09/10/2019 CLINICAL DATA:  Leukocytosis. Recent laparoscopic  cholecystectomy and ERCP. EXAM: CT ABDOMEN AND PELVIS WITH CONTRAST TECHNIQUE: Multidetector CT imaging of the abdomen and pelvis was performed using the standard protocol following bolus administration of intravenous contrast. CONTRAST:  OMNIPAQUE IOHEXOL 300 MG/ML  SOLN COMPARISON:  CT abdomen from 09/04/2019. I also reviewed the patient's operative note and ERCP procedure note. FINDINGS: Lower chest: Mild atelectasis in both lower lobes. Coronary and aortic atherosclerotic vascular calcifications. Hepatobiliary: Bilobed cyst of the right hepatic lobe 2.8 by 2.1 cm on image 19/6. Other small hypodense lesions of the liver technically too small to characterize although may well represent small cysts. There is a 5.5 by 2.8 4.0 cm fluid collection along the cholecystectomy site with surrounding clips noted. No gas is present in this collection. There is a small amount of gas in the common bile duct compatible with sphincterotomy. No significant abnormal degree of enhancement along the extrahepatic biliary tree noted. Minimal intrahepatic biliary dilatation. Pancreas: Small amount of gas is present in the dorsal pancreatic duct which is borderline dilated although less so compared to 09/04/2019. The previously placed pancreatic duct stent has migrated distally appears to currently sit in the left-sided small bowel on image 50/9. Spleen: Unremarkable Adrenals/Urinary Tract: Hypodense bilateral renal lesions are compatible with cysts. Adrenal glands unremarkable. Stomach/Bowel: Scattered moderately dilated loops of small bowel extending down to a transition point in the central pelvis shown on images 69-72 of series 6, beyond which the small bowel is normal in caliber. Orally administered contrast is present in the colon but also there is some progressive dilution of contrast in the small bowel loops. Vascular/Lymphatic: Aortoiliac atherosclerotic vascular disease. Mild prominence of left parametrial venous  structures. Reproductive: Unremarkable Other: There is a collection of free fluid in the pelvis potentially with mild enhancing margins, measuring 7.4 by 4.1 by 6.4 cm (volume = 100 cm^3). There is no gas within this fluid collection but strictly speaking, abscess cannot be excluded. A smaller there is a small right paracentral presacral fluid collection which is likewise indeterminate for infection shown on image 70/6. Trace free fluid in the right paracolic gutter. No significant extraluminal gas. Musculoskeletal: Unremarkable IMPRESSION: 1. The previously placed pancreatic duct stent has migrated distally in the left-sided small bowel. 2. There is a 5.5 by 2.8 by 4.0 cm fluid collection along the cholecystectomy site with surrounding clips but no gas in this collection. This could represent a postoperative seroma, hematoma, or abscess. 3. 100 cubic cm collection of free fluid in the pelvis potentially with mild enhancing margins. There is no gas within this fluid collection but strictly speaking, abscess cannot be excluded. There is also a small right paracentral presacral fluid collection which is likewise indeterminate for infection. 4. Proximal small bowel dilatation with transition point in the right central pelvis between mildly dilated and nondilated small bowel. However, there is dense contrast medium in the colon and accordingly I am skeptical of a high-grade small  bowel obstruction. Given the angulation of the transition point, the possibility of a small adhesion causing low-grade/partial small bowel obstruction is raised. 5. Other imaging findings of potential clinical significance: Coronary atherosclerosis. Mild atelectasis in both lower lobes. Hepatic and renal cysts. Mild prominence of left parametrial venous structures, query pelvic congestion syndrome. Pneumobilia compatible with prior sphincterotomy. Aortic Atherosclerosis (ICD10-I70.0). Electronically Signed   By: Gaylyn RongWalter  Liebkemann M.D.   On:  09/10/2019 18:21   Dg Abd 2 Views  Result Date: 09/10/2019 CLINICAL DATA:  Follow-up pancreatic duct stent. EXAM: ABDOMEN - 2 VIEW COMPARISON:  Abdomen and pelvis CT dated 09/04/2019. ERCP dated 09/07/2019. Operative cholangiogram dated 09/05/2019. FINDINGS: Normal bowel gas pattern. Small caliber stent with a distal pigtail located right mid to lower abdomen. This is in the region of the distal common duct and pancreatic duct on the previous CT. Interval cholecystectomy clips. Lumbar and lower thoracic spine degenerative changes. IMPRESSION: Small caliber stent in the right mid to lower abdomen. This is in the region of the distal common duct and pancreatic duct on the previous CT. Electronically Signed   By: Beckie SaltsSteven  Reid M.D.   On: 09/10/2019 10:52   Ct Image Guided Drainage Percut Cath  Peritoneal Retroperit  Result Date: 09/11/2019 INDICATION: History of cholecystectomy (Dr. Luisa Hartornett), now with indeterminate fluid collections within the gallbladder fossa and pelvis. Please perform CT-guided aspiration and/or drainage catheter placement for infection source control purposes. EXAM: 1. CT-GUIDED RIGHT TRANS GLUTEAL APPROACH DRAINAGE CATHETER PLACEMENT. 2. ULTRASOUND AND CT-GUIDED GALLBLADDER FOSSA DRAINAGE CATHETER PLACEMENT COMPARISON:  CT abdomen pelvis-09/10/2019; 09/04/2019; ERCP-09/07/2019 intraoperative cholangiogram during laparoscopic cholecystectomy-09/05/2019 MEDICATIONS: The patient is currently admitted to the hospital and receiving intravenous antibiotics. The antibiotics were administered within an appropriate time frame prior to the initiation of the procedure. ANESTHESIA/SEDATION: Moderate (conscious) sedation was employed during this procedure. A total of Versed 2 mg and Fentanyl 100 mcg was administered intravenously. Moderate Sedation Time: 20 minutes. The patient's level of consciousness and vital signs were monitored continuously by radiology nursing throughout the procedure under my  direct supervision. CONTRAST:  None COMPLICATIONS: None immediate. PROCEDURE: Informed written consent was obtained from the patient after a discussion of the risks, benefits and alternatives to treatment. The patient was initially placed prone on the CT gantry and a pre procedural CT was performed re-demonstrating the known abscess/fluid collection within the lower pelvis with dominant ill-defined component measuring approximately 3.4 x 2.8 cm (image 30, series 3). The procedure was planned. A timeout was performed prior to the initiation of the procedure. The skin overlying the right buttocks was prepped and draped in the usual sterile fashion. The overlying soft tissues were anesthetized with 1% lidocaine with epinephrine. Appropriate trajectory was planned with the use of a 22 gauge spinal needle. An 18 gauge trocar needle was advanced into the abscess/fluid collection and a short Amplatz super stiff wire was coiled within the collection. Appropriate positioning was confirmed with a limited CT scan. The tract was serially dilated allowing placement of a 10 [place] all-purpose drainage catheter. Appropriate positioning was confirmed with a limited postprocedural CT scan. Approximately 15 ml of purulent fluid was aspirated. The tube was connected to a JP bulb and sutured in place. A dressing was placed. _________________________________________________________ Attention was now paid towards the indeterminate fluid collection within the gallbladder fossa. The patient was positioned supine on the CT gantry with preprocedural imaging demonstrating an approximately 4.5 x 2.3 cm mixed attenuating fluid collection with the gallbladder fossa (image 45, series 2). The  collection was identified sonographically. The skin overlying the right upper abdominal quadrant was prepped and draped in usual sterile fashion. After lying soft tissues were anesthetized with 1% lidocaine with epinephrine, the collection was accessed with  an 18 gauge trocar needle under direct ultrasound guidance. Ultrasound image was saved for procedural documentation purposes Next, a short Amplatz wire was coiled within the collection. Appropriate position was confirmed with limited CT imaging. The track was dilated allowing placement of a 10 French percutaneous drainage catheter. Appropriate drainage catheter positioning was confirmed with subsequent CT imaging and the drainage catheter was retracted with end coiled and locked with the peripheral aspect of the residual collection within the gallbladder fossa. Following drainage catheter placement, approximately 10 cc of bloody fluid was aspirated. Both drainage catheters were flushed with a small amount of saline, connected to JP bulbs and sutured in place. The patient tolerated the above procedures well without immediate post procedural complication. IMPRESSION: 1. Successful CT guided placement of a 10 [place] all purpose drain catheter into the pelvic fluid collection via right trans gluteal approach with aspiration of 15 mL of purulent fluid. Samples were sent to the laboratory as requested by the ordering clinical team. 2. Successful ultrasound and CT-guided placement of a 10 French all-purpose drainage catheter into the mixed attenuating fluid collection with gallbladder fossa yielding approximately 10 cc bloody fluid. PLAN: - Repeat IV only CT imaging may be performed once both drainage catheters yield less than 10 cc of output (excluding flushes) per day. - Once the pelvic fluid collection has resolved with CT imaging and output is less than 10 cc per day, the trans gluteal approach drain may be removed without dedicated drainage catheter injection given its remote location from the operative intervention. - would recommend fluoroscopic guided drainage catheter injection of the gallbladder fossa could drain prior to consideration of removal. Electronically Signed   By: Sandi Mariscal M.D.   On: 09/11/2019  14:30   Ct Image Guided Drainage Percut Cath  Peritoneal Retroperit  Result Date: 09/11/2019 INDICATION: History of cholecystectomy (Dr. Brantley Stage), now with indeterminate fluid collections within the gallbladder fossa and pelvis. Please perform CT-guided aspiration and/or drainage catheter placement for infection source control purposes. EXAM: 1. CT-GUIDED RIGHT TRANS GLUTEAL APPROACH DRAINAGE CATHETER PLACEMENT. 2. ULTRASOUND AND CT-GUIDED GALLBLADDER FOSSA DRAINAGE CATHETER PLACEMENT COMPARISON:  CT abdomen pelvis-09/10/2019; 09/04/2019; ERCP-09/07/2019 intraoperative cholangiogram during laparoscopic cholecystectomy-09/05/2019 MEDICATIONS: The patient is currently admitted to the hospital and receiving intravenous antibiotics. The antibiotics were administered within an appropriate time frame prior to the initiation of the procedure. ANESTHESIA/SEDATION: Moderate (conscious) sedation was employed during this procedure. A total of Versed 2 mg and Fentanyl 100 mcg was administered intravenously. Moderate Sedation Time: 20 minutes. The patient's level of consciousness and vital signs were monitored continuously by radiology nursing throughout the procedure under my direct supervision. CONTRAST:  None COMPLICATIONS: None immediate. PROCEDURE: Informed written consent was obtained from the patient after a discussion of the risks, benefits and alternatives to treatment. The patient was initially placed prone on the CT gantry and a pre procedural CT was performed re-demonstrating the known abscess/fluid collection within the lower pelvis with dominant ill-defined component measuring approximately 3.4 x 2.8 cm (image 30, series 3). The procedure was planned. A timeout was performed prior to the initiation of the procedure. The skin overlying the right buttocks was prepped and draped in the usual sterile fashion. The overlying soft tissues were anesthetized with 1% lidocaine with epinephrine. Appropriate trajectory  was planned with  the use of a 22 gauge spinal needle. An 18 gauge trocar needle was advanced into the abscess/fluid collection and a short Amplatz super stiff wire was coiled within the collection. Appropriate positioning was confirmed with a limited CT scan. The tract was serially dilated allowing placement of a 10 [place] all-purpose drainage catheter. Appropriate positioning was confirmed with a limited postprocedural CT scan. Approximately 15 ml of purulent fluid was aspirated. The tube was connected to a JP bulb and sutured in place. A dressing was placed. _________________________________________________________ Attention was now paid towards the indeterminate fluid collection within the gallbladder fossa. The patient was positioned supine on the CT gantry with preprocedural imaging demonstrating an approximately 4.5 x 2.3 cm mixed attenuating fluid collection with the gallbladder fossa (image 45, series 2). The collection was identified sonographically. The skin overlying the right upper abdominal quadrant was prepped and draped in usual sterile fashion. After lying soft tissues were anesthetized with 1% lidocaine with epinephrine, the collection was accessed with an 18 gauge trocar needle under direct ultrasound guidance. Ultrasound image was saved for procedural documentation purposes Next, a short Amplatz wire was coiled within the collection. Appropriate position was confirmed with limited CT imaging. The track was dilated allowing placement of a 10 French percutaneous drainage catheter. Appropriate drainage catheter positioning was confirmed with subsequent CT imaging and the drainage catheter was retracted with end coiled and locked with the peripheral aspect of the residual collection within the gallbladder fossa. Following drainage catheter placement, approximately 10 cc of bloody fluid was aspirated. Both drainage catheters were flushed with a small amount of saline, connected to JP bulbs and sutured  in place. The patient tolerated the above procedures well without immediate post procedural complication. IMPRESSION: 1. Successful CT guided placement of a 10 [place] all purpose drain catheter into the pelvic fluid collection via right trans gluteal approach with aspiration of 15 mL of purulent fluid. Samples were sent to the laboratory as requested by the ordering clinical team. 2. Successful ultrasound and CT-guided placement of a 10 French all-purpose drainage catheter into the mixed attenuating fluid collection with gallbladder fossa yielding approximately 10 cc bloody fluid. PLAN: - Repeat IV only CT imaging may be performed once both drainage catheters yield less than 10 cc of output (excluding flushes) per day. - Once the pelvic fluid collection has resolved with CT imaging and output is less than 10 cc per day, the trans gluteal approach drain may be removed without dedicated drainage catheter injection given its remote location from the operative intervention. - would recommend fluoroscopic guided drainage catheter injection of the gallbladder fossa could drain prior to consideration of removal. Electronically Signed   By: Simonne Come M.D.   On: 09/11/2019 14:30    Labs:  CBC: Recent Labs    09/09/19 0305 09/10/19 0248 09/11/19 0251 09/12/19 0306  WBC 10.2 17.3* 12.0* 10.6*  HGB 11.4* 11.7* 10.5* 11.4*  HCT 33.6* 34.5* 31.2* 33.9*  PLT 250 258 246 284    COAGS: Recent Labs    09/11/19 1006  INR 1.2    BMP: Recent Labs    09/07/19 0249 09/08/19 0524 09/09/19 0305 09/12/19 0306  NA 135 137 138 140  K 3.2* 3.5 2.8* 3.4*  CL 101 103 103 101  CO2 GLUCOSE 130* 145* 117* 87  BUN <5*  CALCIUM 8.8* 8.8* 8.6* 8.8*  CREATININE 1.03* 1.07* 0.90 0.74  GFRNONAA 53* 50* >60 >60  GFRAA >60 58* >60 >  60    LIVER FUNCTION TESTS: Recent Labs    09/07/19 0249 09/08/19 0524 09/09/19 0305 09/12/19 0306  BILITOT 0.7 0.5 0.2* 0.6  AST 103* 69* 58* 27  ALT  140* 101* 84* 40  ALKPHOS 329* 261* 218* 154*  PROT 5.7* 5.7* 5.6* 5.5*  ALBUMIN 2.1* 2.1* 2.1* 2.2*    Assessment and Plan:  78 y.o female. History of HTN presented to [facility] with right sided abdominal pain. 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 2. There is a 5.5 by 2.8 by 4.0 cm fluid collection along the cholecystectomy site with surrounding clips but no gas in this collection. This could represent a postoperative seroma, hematoma, or abscess. 3. 100 cubic cm collection of free fluid in the pelvis potentially with mild enhancing margins. There is no gas within this fluid collection but strictly speaking, abscess cannot be excluded. There is also a small right paracentral presacral fluid collection which is likewise indeterminate for infection  IR placed a 10 Fr APDL in the pelvic fluid collection and a 10 Fr APDL on the gallbladder fossa on 11.15.20.   Per Epic output is: Right buttock :65 ml RUQ- 32.5 ml.,  Recommend team continue with flushing TID, output recording q shift and dressing changes as needed. Would consider additional imaging when output is less than 10 ml for 24 hours not including flush material.   Electronically Signed: Marletta Lor, NP 09/12/2019, 11:07 AM   I spent a total of 15 Minutes at the the patient's bedside AND on the patient's hospital floor or unit, greater than 50% of which was counseling/coordinating care for pelvic drain and gallbladder fossa drain.    Patient ID: JACOB CHAMBLEE, female   DOB: April 15, 1941, 78 y.o.   MRN: [id number]

## 2019-09-12 NOTE — Progress Notes (Signed)
5 Days Post-Op   Subjective/Chief Complaint: Still complaining of some abdominal pain   Objective: Vital signs in last 24 hours: Temp:  [98.8 F (37.1 C)-98.9 F (37.2 C)] 98.9 F (37.2 C) (11/15 2200) Pulse Rate:  [82-91] 83 (11/16 0014) Resp:  [12-18] 16 (11/15 2200) BP: (135-164)/(25-76) 154/75 (11/16 0014) SpO2:  [98 %-100 %] 100 % (11/15 2200) Last BM Date: 09/09/19  Intake/Output from previous day: 11/15 0701 - 11/16 0700 In: 1310 [P.O.:360; I.V.:750; IV Piggyback:200] Out: 197.5 [Urine:100; Drains:97.5] Intake/Output this shift: No intake/output data recorded.  Exam: Awake and alert Looks comfortable Abdomen soft, drain in GB fossa serosang, pelvic drain with purulence  Lab Results:  Recent Labs    09/11/19 0251 09/12/19 0306  WBC 12.0* 10.6*  HGB 10.5* 11.4*  HCT 31.2* 33.9*  PLT 246 284   BMET Recent Labs    09/12/19 0306  NA 140  K 3.4*  CL 101  CO2 24  GLUCOSE 87  BUN <5*  CREATININE 0.74  CALCIUM 8.8*   PT/INR Recent Labs    09/11/19 1006  LABPROT 15.4*  INR 1.2   ABG No results for input(s): PHART, HCO3 in the last 72 hours.  Invalid input(s): PCO2, PO2  Studies/Results: Ct Abdomen Pelvis W Contrast  Result Date: 09/10/2019 CLINICAL DATA:  Leukocytosis. Recent laparoscopic cholecystectomy and ERCP. EXAM: CT ABDOMEN AND PELVIS WITH CONTRAST TECHNIQUE: Multidetector CT imaging of the abdomen and pelvis was performed using the standard protocol following bolus administration of intravenous contrast. CONTRAST:  100mL OMNIPAQUE IOHEXOL 300 MG/ML  SOLN COMPARISON:  CT abdomen from 09/04/2019. I also reviewed the patient's operative note and ERCP procedure note. FINDINGS: Lower chest: Mild atelectasis in both lower lobes. Coronary and aortic atherosclerotic vascular calcifications. Hepatobiliary: Bilobed cyst of the right hepatic lobe 2.8 by 2.1 cm on image 19/6. Other small hypodense lesions of the liver technically too small to characterize  although may well represent small cysts. There is a 5.5 by 2.8 4.0 cm fluid collection along the cholecystectomy site with surrounding clips noted. No gas is present in this collection. There is a small amount of gas in the common bile duct compatible with sphincterotomy. No significant abnormal degree of enhancement along the extrahepatic biliary tree noted. Minimal intrahepatic biliary dilatation. Pancreas: Small amount of gas is present in the dorsal pancreatic duct which is borderline dilated although less so compared to 09/04/2019. The previously placed pancreatic duct stent has migrated distally appears to currently sit in the left-sided small bowel on image 50/9. Spleen: Unremarkable Adrenals/Urinary Tract: Hypodense bilateral renal lesions are compatible with cysts. Adrenal glands unremarkable. Stomach/Bowel: Scattered moderately dilated loops of small bowel extending down to a transition point in the central pelvis shown on images 69-72 of series 6, beyond which the small bowel is normal in caliber. Orally administered contrast is present in the colon but also there is some progressive dilution of contrast in the small bowel loops. Vascular/Lymphatic: Aortoiliac atherosclerotic vascular disease. Mild prominence of left parametrial venous structures. Reproductive: Unremarkable Other: There is a collection of free fluid in the pelvis potentially with mild enhancing margins, measuring 7.4 by 4.1 by 6.4 cm (volume = 100 cm^3). There is no gas within this fluid collection but strictly speaking, abscess cannot be excluded. A smaller there is a small right paracentral presacral fluid collection which is likewise indeterminate for infection shown on image 70/6. Trace free fluid in the right paracolic gutter. No significant extraluminal gas. Musculoskeletal: Unremarkable IMPRESSION: 1. The previously placed  pancreatic duct stent has migrated distally in the left-sided small bowel. 2. There is a 5.5 by 2.8 by 4.0 cm  fluid collection along the cholecystectomy site with surrounding clips but no gas in this collection. This could represent a postoperative seroma, hematoma, or abscess. 3. 100 cubic cm collection of free fluid in the pelvis potentially with mild enhancing margins. There is no gas within this fluid collection but strictly speaking, abscess cannot be excluded. There is also a small right paracentral presacral fluid collection which is likewise indeterminate for infection. 4. Proximal small bowel dilatation with transition point in the right central pelvis between mildly dilated and nondilated small bowel. However, there is dense contrast medium in the colon and accordingly I am skeptical of a high-grade small bowel obstruction. Given the angulation of the transition point, the possibility of a small adhesion causing low-grade/partial small bowel obstruction is raised. 5. Other imaging findings of potential clinical significance: Coronary atherosclerosis. Mild atelectasis in both lower lobes. Hepatic and renal cysts. Mild prominence of left parametrial venous structures, query pelvic congestion syndrome. Pneumobilia compatible with prior sphincterotomy. Aortic Atherosclerosis (ICD10-I70.0). Electronically Signed   By: Gaylyn Rong M.D.   On: 09/10/2019 18:21   Dg Abd 2 Views  Result Date: 09/10/2019 CLINICAL DATA:  Follow-up pancreatic duct stent. EXAM: ABDOMEN - 2 VIEW COMPARISON:  Abdomen and pelvis CT dated 09/04/2019. ERCP dated 09/07/2019. Operative cholangiogram dated 09/05/2019. FINDINGS: Normal bowel gas pattern. Small caliber stent with a distal pigtail located right mid to lower abdomen. This is in the region of the distal common duct and pancreatic duct on the previous CT. Interval cholecystectomy clips. Lumbar and lower thoracic spine degenerative changes. IMPRESSION: Small caliber stent in the right mid to lower abdomen. This is in the region of the distal common duct and pancreatic duct on the  previous CT. Electronically Signed   By: Beckie Salts M.D.   On: 09/10/2019 10:52   Ct Image Guided Drainage Percut Cath  Peritoneal Retroperit  Result Date: 09/11/2019 INDICATION: History of cholecystectomy (Dr. Luisa Hart), now with indeterminate fluid collections within the gallbladder fossa and pelvis. Please perform CT-guided aspiration and/or drainage catheter placement for infection source control purposes. EXAM: 1. CT-GUIDED RIGHT TRANS GLUTEAL APPROACH DRAINAGE CATHETER PLACEMENT. 2. ULTRASOUND AND CT-GUIDED GALLBLADDER FOSSA DRAINAGE CATHETER PLACEMENT COMPARISON:  CT abdomen pelvis-09/10/2019; 09/04/2019; ERCP-09/07/2019 intraoperative cholangiogram during laparoscopic cholecystectomy-09/05/2019 MEDICATIONS: The patient is currently admitted to the hospital and receiving intravenous antibiotics. The antibiotics were administered within an appropriate time frame prior to the initiation of the procedure. ANESTHESIA/SEDATION: Moderate (conscious) sedation was employed during this procedure. A total of Versed 2 mg and Fentanyl 100 mcg was administered intravenously. Moderate Sedation Time: 20 minutes. The patient's level of consciousness and vital signs were monitored continuously by radiology nursing throughout the procedure under my direct supervision. CONTRAST:  None COMPLICATIONS: None immediate. PROCEDURE: Informed written consent was obtained from the patient after a discussion of the risks, benefits and alternatives to treatment. The patient was initially placed prone on the CT gantry and a pre procedural CT was performed re-demonstrating the known abscess/fluid collection within the lower pelvis with dominant ill-defined component measuring approximately 3.4 x 2.8 cm (image 30, series 3). The procedure was planned. A timeout was performed prior to the initiation of the procedure. The skin overlying the right buttocks was prepped and draped in the usual sterile fashion. The overlying soft tissues  were anesthetized with 1% lidocaine with epinephrine. Appropriate trajectory was planned with the use of  a 22 gauge spinal needle. An 18 gauge trocar needle was advanced into the abscess/fluid collection and a short Amplatz super stiff wire was coiled within the collection. Appropriate positioning was confirmed with a limited CT scan. The tract was serially dilated allowing placement of a 10 [place] all-purpose drainage catheter. Appropriate positioning was confirmed with a limited postprocedural CT scan. Approximately 15 ml of purulent fluid was aspirated. The tube was connected to a JP bulb and sutured in place. A dressing was placed. _________________________________________________________ Attention was now paid towards the indeterminate fluid collection within the gallbladder fossa. The patient was positioned supine on the CT gantry with preprocedural imaging demonstrating an approximately 4.5 x 2.3 cm mixed attenuating fluid collection with the gallbladder fossa (image 45, series 2). The collection was identified sonographically. The skin overlying the right upper abdominal quadrant was prepped and draped in usual sterile fashion. After lying soft tissues were anesthetized with 1% lidocaine with epinephrine, the collection was accessed with an 18 gauge trocar needle under direct ultrasound guidance. Ultrasound image was saved for procedural documentation purposes Next, a short Amplatz wire was coiled within the collection. Appropriate position was confirmed with limited CT imaging. The track was dilated allowing placement of a 10 French percutaneous drainage catheter. Appropriate drainage catheter positioning was confirmed with subsequent CT imaging and the drainage catheter was retracted with end coiled and locked with the peripheral aspect of the residual collection within the gallbladder fossa. Following drainage catheter placement, approximately 10 cc of bloody fluid was aspirated. Both drainage catheters  were flushed with a small amount of saline, connected to JP bulbs and sutured in place. The patient tolerated the above procedures well without immediate post procedural complication. IMPRESSION: 1. Successful CT guided placement of a 10 [place] all purpose drain catheter into the pelvic fluid collection via right trans gluteal approach with aspiration of 15 mL of purulent fluid. Samples were sent to the laboratory as requested by the ordering clinical team. 2. Successful ultrasound and CT-guided placement of a 10 French all-purpose drainage catheter into the mixed attenuating fluid collection with gallbladder fossa yielding approximately 10 cc bloody fluid. PLAN: - Repeat IV only CT imaging may be performed once both drainage catheters yield less than 10 cc of output (excluding flushes) per day. - Once the pelvic fluid collection has resolved with CT imaging and output is less than 10 cc per day, the trans gluteal approach drain may be removed without dedicated drainage catheter injection given its remote location from the operative intervention. - would recommend fluoroscopic guided drainage catheter injection of the gallbladder fossa could drain prior to consideration of removal. Electronically Signed   By: Simonne Come M.D.   On: 09/11/2019 14:30   Ct Image Guided Drainage Percut Cath  Peritoneal Retroperit  Result Date: 09/11/2019 INDICATION: History of cholecystectomy (Dr. Luisa Hart), now with indeterminate fluid collections within the gallbladder fossa and pelvis. Please perform CT-guided aspiration and/or drainage catheter placement for infection source control purposes. EXAM: 1. CT-GUIDED RIGHT TRANS GLUTEAL APPROACH DRAINAGE CATHETER PLACEMENT. 2. ULTRASOUND AND CT-GUIDED GALLBLADDER FOSSA DRAINAGE CATHETER PLACEMENT COMPARISON:  CT abdomen pelvis-09/10/2019; 09/04/2019; ERCP-09/07/2019 intraoperative cholangiogram during laparoscopic cholecystectomy-09/05/2019 MEDICATIONS: The patient is currently  admitted to the hospital and receiving intravenous antibiotics. The antibiotics were administered within an appropriate time frame prior to the initiation of the procedure. ANESTHESIA/SEDATION: Moderate (conscious) sedation was employed during this procedure. A total of Versed 2 mg and Fentanyl 100 mcg was administered intravenously. Moderate Sedation Time: 20 minutes. The patient's level  of consciousness and vital signs were monitored continuously by radiology nursing throughout the procedure under my direct supervision. CONTRAST:  None COMPLICATIONS: None immediate. PROCEDURE: Informed written consent was obtained from the patient after a discussion of the risks, benefits and alternatives to treatment. The patient was initially placed prone on the CT gantry and a pre procedural CT was performed re-demonstrating the known abscess/fluid collection within the lower pelvis with dominant ill-defined component measuring approximately 3.4 x 2.8 cm (image 30, series 3). The procedure was planned. A timeout was performed prior to the initiation of the procedure. The skin overlying the right buttocks was prepped and draped in the usual sterile fashion. The overlying soft tissues were anesthetized with 1% lidocaine with epinephrine. Appropriate trajectory was planned with the use of a 22 gauge spinal needle. An 18 gauge trocar needle was advanced into the abscess/fluid collection and a short Amplatz super stiff wire was coiled within the collection. Appropriate positioning was confirmed with a limited CT scan. The tract was serially dilated allowing placement of a 10 [place] all-purpose drainage catheter. Appropriate positioning was confirmed with a limited postprocedural CT scan. Approximately 15 ml of purulent fluid was aspirated. The tube was connected to a JP bulb and sutured in place. A dressing was placed. _________________________________________________________ Attention was now paid towards the indeterminate fluid  collection within the gallbladder fossa. The patient was positioned supine on the CT gantry with preprocedural imaging demonstrating an approximately 4.5 x 2.3 cm mixed attenuating fluid collection with the gallbladder fossa (image 45, series 2). The collection was identified sonographically. The skin overlying the right upper abdominal quadrant was prepped and draped in usual sterile fashion. After lying soft tissues were anesthetized with 1% lidocaine with epinephrine, the collection was accessed with an 18 gauge trocar needle under direct ultrasound guidance. Ultrasound image was saved for procedural documentation purposes Next, a short Amplatz wire was coiled within the collection. Appropriate position was confirmed with limited CT imaging. The track was dilated allowing placement of a 10 French percutaneous drainage catheter. Appropriate drainage catheter positioning was confirmed with subsequent CT imaging and the drainage catheter was retracted with end coiled and locked with the peripheral aspect of the residual collection within the gallbladder fossa. Following drainage catheter placement, approximately 10 cc of bloody fluid was aspirated. Both drainage catheters were flushed with a small amount of saline, connected to JP bulbs and sutured in place. The patient tolerated the above procedures well without immediate post procedural complication. IMPRESSION: 1. Successful CT guided placement of a 10 [place] all purpose drain catheter into the pelvic fluid collection via right trans gluteal approach with aspiration of 15 mL of purulent fluid. Samples were sent to the laboratory as requested by the ordering clinical team. 2. Successful ultrasound and CT-guided placement of a 10 French all-purpose drainage catheter into the mixed attenuating fluid collection with gallbladder fossa yielding approximately 10 cc bloody fluid. PLAN: - Repeat IV only CT imaging may be performed once both drainage catheters yield less  than 10 cc of output (excluding flushes) per day. - Once the pelvic fluid collection has resolved with CT imaging and output is less than 10 cc per day, the trans gluteal approach drain may be removed without dedicated drainage catheter injection given its remote location from the operative intervention. - would recommend fluoroscopic guided drainage catheter injection of the gallbladder fossa could drain prior to consideration of removal. Electronically Signed   By: Simonne Come M.D.   On: 09/11/2019 14:30  Anti-infectives: Anti-infectives (From admission, onward)   Start     Dose/Rate Route Frequency Ordered Stop   09/08/19 0600  ciprofloxacin (CIPRO) IVPB 400 mg  Status:  Discontinued     400 mg 200 mL/hr over 60 Minutes Intravenous On call to O.R. 09/07/19 1311 09/07/19 1414   09/05/19 2300  ciprofloxacin (CIPRO) IVPB 400 mg     400 mg 200 mL/hr over 60 Minutes Intravenous Every 12 hours 09/05/19 1425 09/13/19 0459   09/04/19 2300  ciprofloxacin (CIPRO) IVPB 400 mg  Status:  Discontinued     400 mg 200 mL/hr over 60 Minutes Intravenous Every 12 hours 09/04/19 1314 09/05/19 1425   09/04/19 1115  ciprofloxacin (CIPRO) IVPB 400 mg     400 mg 200 mL/hr over 60 Minutes Intravenous  Once 09/04/19 1101 09/04/19 1433   09/04/19 1115  metroNIDAZOLE (FLAGYL) IVPB 500 mg     500 mg 100 mL/hr over 60 Minutes Intravenous  Once 09/04/19 1101 09/04/19 1334      Assessment/Plan: s/p Procedure(s): ENDOSCOPIC RETROGRADE CHOLANGIOPANCREATOGRAPHY (ERCP) (N/A) SPHINCTEROTOMY PANCREATIC STENT PLACEMENT REMOVAL OF STONES  Appreciate IR's help WBC improving Clinically she looks much better Awaiting cultures IV antibiotics  LOS: 7 days    Coralie Keens 09/12/2019

## 2019-09-12 NOTE — Progress Notes (Signed)
Physical Therapy Treatment Patient Details Name: Samantha Gonzales MRN: [id number] DOB: [birth year] Today's Date: 09/12/2019    History of Present Illness Pt is a 78 y/o female admitted secondary to abdominal pain and found to have cholecystitis, now s/p ex lap and ERCP. PMH including but not limited to HTN.    PT Comments    Continuing work on functional mobility and activity tolerance;  Agreeable to amb with lots of encouragement and my best efforts to educate re: the detrimental effects of bedrest; Ultimately walked with RW to help stabilize trunk and hopefully make walking more comfortable; Introduced the incentive spirometer to her; initally she is pulling 500cc  Follow Up Recommendations  Home health PT;Supervision/Assistance - 24 hour     Equipment Recommendations  Rolling walker with 5" wheels    Recommendations for Other Services       Precautions / Restrictions Precautions Precautions: Fall    Mobility  Bed Mobility Overal bed mobility: Needs Assistance Bed Mobility: Rolling;Sidelying to Sit;Sit to Supine Rolling: Min assist Sidelying to sit: Min guard   Sit to supine: Supervision   General bed mobility comments: for safety  Transfers Overall transfer level: Needs assistance Equipment used: Rolling walker (2 wheeled) Transfers: Sit to/from Stand Sit to Stand: Min assist         General transfer comment: Stood from bed to rW; cues for hand placement  Ambulation/Gait Ambulation/Gait assistance: Min guard Gait Distance (Feet): 120 Feet Assistive device: Rolling walker (2 wheeled) Gait Pattern/deviations: Step-through pattern Gait velocity:     General Gait Details: Opted to use the RW due to abdominal discomfort; cues for posture and RW proximity   Stairs             Wheelchair Mobility    Modified Rankin (Stroke Patients Only)       Balance     Sitting balance-Leahy Scale: Good       Standing balance-Leahy Scale: Poor                              Cognition Arousal/Alertness: Awake/alert Behavior During Therapy: WFL for tasks assessed/performed Overall Cognitive Status: Impaired/Different from baseline Area of Impairment: Safety/judgement                         Safety/Judgement: Decreased awareness of safety;Decreased awareness of deficits            Exercises      General Comments        Pertinent Vitals/Pain Pain Assessment: Faces Faces Pain Scale: Hurts even more Pain Location: abdomen; R ide Pain Descriptors / Indicators: Grimacing;Guarding Pain Intervention(s): Monitored during session    Home Living                      Prior Function            PT Goals (current goals can now be found in the care plan section) Acute Rehab PT Goals Patient Stated Goal: "to go home" PT Goal Formulation: With patient/family Time For Goal Achievement: 09/22/19 Potential to Achieve Goals: Good Progress towards PT goals: Progressing toward goals    Frequency    Min 3X/week      PT Plan Current plan remains appropriate    Co-evaluation              AM-PAC PT "6 Clicks" Mobility   Outcome Measure  Help needed turning  from your back to your side while in a flat bed without using bedrails?: None Help needed moving from lying on your back to sitting on the side of a flat bed without using bedrails?: None Help needed moving to and from a bed to a chair (including a wheelchair)?: A Little Help needed standing up from a chair using your arms (e.g., wheelchair or bedside chair)?: None Help needed to walk in hospital room?: A Little Help needed climbing 3-5 steps with a railing? : A Little 6 Click Score: 21    End of Session   Activity Tolerance: Patient tolerated treatment well Patient left: in bed;with call bell/phone within reach;with family/visitor present Nurse Communication: Mobility status PT Visit Diagnosis: Other abnormalities of gait and mobility  (R26.89)     Time: 5726-2035 PT Time Calculation (min) (ACUTE ONLY): 10 min  Charges:  $Gait Training: 8-22 mins                     Van Clines, PT  Acute Rehabilitation Services Pager [phone] Office [phone]    Levi Aland 09/12/2019, 5:19 PM

## 2019-09-12 NOTE — Progress Notes (Signed)
Per today's IR note, to continue flushing drains TID.  Attempted to obtain order for flushing.  Per on call IR MD, ok to wait until tomorrow and address it.  Will endorse to next day shift RN to follow up.  Will continue to monitor

## 2019-09-13 NOTE — Progress Notes (Signed)
Physical Therapy Treatment Patient Details Name: Samantha Gonzales MRN: [id number] DOB: 07/25/1941 Today's Date: 09/13/2019    History of Present Illness Pt is a 78 y/o female admitted secondary to abdominal pain and found to have cholecystitis, now s/p ex lap and ERCP. PMH including but not limited to HTN.    PT Comments    Continuing work on functional mobility and activity tolerance;  Steady progress, able to walk further than last session; tired at end of walk; on track for dc home tomorrow if medically ready   Follow Up Recommendations  Home health PT;Supervision/Assistance - 24 hour     Equipment Recommendations  Rolling walker with 5" wheels    Recommendations for Other Services       Precautions / Restrictions Precautions Precautions: Fall Precaution Comments: Fall risk reatly reduced with use of RW    Mobility  Bed Mobility                  Transfers Overall transfer level: Needs assistance Equipment used: Rolling walker (2 wheeled) Transfers: Sit to/from Stand Sit to Stand: Min guard         General transfer comment: Cues for ahnd placement  Ambulation/Gait Ambulation/Gait assistance: Min guard Gait Distance (Feet): 250 Feet Assistive device: Rolling walker (2 wheeled) Gait Pattern/deviations: Step-through pattern     General Gait Details: Opted to use the RW due to abdominal discomfort; cues for posture and RW proximity, with noted improvement from previous walk; Narrow step width noted, but not effecting balance with use of RW    Stairs             Wheelchair Mobility    Modified Rankin (Stroke Patients Only)       Balance     Sitting balance-Leahy Scale: Good       Standing balance-Leahy Scale: Fair                              Cognition Arousal/Alertness: Awake/alert Behavior During Therapy: WFL for tasks assessed/performed Overall Cognitive Status: Within Functional Limits for tasks assessed(for simple  mobility tasks)                                 General Comments: Improving      Exercises      General Comments        Pertinent Vitals/Pain Pain Assessment: Faces Faces Pain Scale: Hurts a little bit Pain Location: abdomen; R side Pain Descriptors / Indicators: Grimacing Pain Intervention(s): Monitored during session;Premedicated before session    Home Living                      Prior Function            PT Goals (current goals can now be found in the care plan section) Acute Rehab PT Goals Patient Stated Goal: "to go home" PT Goal Formulation: With patient/family Time For Goal Achievement: 09/22/19 Potential to Achieve Goals: Good Progress towards PT goals: Progressing toward goals    Frequency    Min 3X/week      PT Plan Current plan remains appropriate    Co-evaluation              AM-PAC PT "6 Clicks" Mobility   Outcome Measure  Help needed turning from your back to your side while in a flat bed without using  bedrails?: None Help needed moving from lying on your back to sitting on the side of a flat bed without using bedrails?: None Help needed moving to and from a bed to a chair (including a wheelchair)?: A Little Help needed standing up from a chair using your arms (e.g., wheelchair or bedside chair)?: None Help needed to walk in hospital room?: A Little Help needed climbing 3-5 steps with a railing? : A Little 6 Click Score: 21    End of Session   Activity Tolerance: Patient tolerated treatment well Patient left: in chair;with call bell/phone within reach Nurse Communication: Mobility status PT Visit Diagnosis: Other abnormalities of gait and mobility (R26.89)     Time: 2505-3976 PT Time Calculation (min) (ACUTE ONLY): 24 min  Charges:  $Gait Training: 23-37 mins                     Van Clines, Ryan  Acute Rehabilitation Services Pager [phone] Office [phone]    Levi Aland 09/13/2019, 2:02  PM

## 2019-09-13 NOTE — Progress Notes (Addendum)
Patient ID: Samantha Gonzales, female   DOB: Jun 15, 1941, 78 y.o.   MRN: [id number]    Referring Physician(s): MD - Jens Som  Supervising Physician: Aletta Edouard  Patient Status:  [facility] - In-pt  Chief Complaint:  Right sided abdominal pain found to have acute cholecysytsis   Subjective:  78 y.o. female. History of HTN presented to [facility] with right sided abdominal pain. CT abd pelvis with contrast   From 11.14.20 found to have acute cholecystitis. s/p lap chole on 11.9.20 and ERCP with pancreatic duct stent on 11.11.20. Patient continued to have leukocytosis and CT abd pelvis on 11.14.20 reads: The previously placed pancreatic duct stent has migrated distally in the left-sided small bowel. 2. There is a 5.5 by 2.8 by 4.0 cm fluid collection along the cholecystectomy site with surrounding clips but no gas in this collection. This could represent a postoperative seroma, hematoma, or abscess. 3. 100 cubic cm collection of free fluid in the pelvis potentially with mild enhancing margins. There is no gas within this fluid collection but strictly speaking, abscess cannot be excluded. There is also a small right paracentral presacral fluid collection which is likewise indeterminate for infection  On 11.15.20 I R placed a 10 Fr APDL in the pelvic fkuid collection and the gallbladder fossa  Patient alert and laying in bed. Denies any fevers, headache, chest pain, SOB, cough, abdominal pain, nausea, vomiting or bleeding.    Allergies: Penicillins  Medications: Prior to Admission medications   Medication Sig Start Date End Date Taking? Authorizing Provider  acetaminophen (TYLENOL) 325 MG tablet Take 650 mg by mouth every 6 (six) hours as needed for mild pain or moderate pain.   Yes [provider]  amLODipine (NORVASC) 5 MG tablet Take 1 tablet (5 mg total) by mouth daily. Patient taking differently: Take 10 mg by mouth daily.  01/21/18  Yes Geradine Girt, DO    HYDROcodone-acetaminophen (NORCO) 7.5-325 MG tablet Take 1 tablet by mouth 3 (three) times daily as needed for pain. 12/24/17  Yes [provider]  lactulose (CHRONULAC) 10 GM/15ML solution Take 30 mLs (20 g total) by mouth 2 (two) times daily as needed for mild constipation. For daily bowel movements 01/20/18  Yes Vann, Jessica U, DO  rosuvastatin (CRESTOR) 20 MG tablet Take 20 mg by mouth daily. 07/30/18  Yes [provider]  Cyanocobalamin (B-12) 1000 MCG SUBL Place 1,000 mcg under the tongue daily. Patient not taking: Reported on 09/04/2019 01/20/18   Geradine Girt, DO  pantoprazole (PROTONIX) 40 MG tablet Take 1 tablet (40 mg total) by mouth 2 (two) times daily. Patient not taking: Reported on 09/04/2019 01/20/18   Geradine Girt, DO  thiamine 100 MG tablet Take 1 tablet (100 mg total) by mouth daily. Patient not taking: Reported on 09/04/2019 01/21/18   Geradine Girt, DO     Vital Signs: BP (!) 143/69 (BP Location: Right Arm)    Pulse 75    Temp 98 F (36.7 C) (Oral)    Resp 17    Ht 6' (1.829 m) Comment: per 01/21/18 encounter   Wt 179 lb 14.3 oz (81.6 kg) Comment: from 01/21/18 encounter   SpO2 98%    BMI 24.40 kg/m   Physical Exam Vitals signs and nursing note reviewed.  Constitutional:      General: She is not in acute distress. Cardiovascular:     Rate and Rhythm: Normal rate.  Pulmonary:     Effort: Pulmonary effort is  normal. No respiratory distress.  Abdominal:     Comments: Positive RUQ drain to suction serosanguinous output. scant amount of fluid noted. Sat loc and suture intact.  Dressing is clean dry and intact. No eryethema, edema, drainage or bleeding noted at exit site.   Positive transgluteal drain to suction cloudy serous output. Scant amount of fluid noted. Sat loc and suture intact.  Dressing is clean dry and intact. No erythema, edema, drainage or bleeding noted at exit site.   Skin:    General: Skin is warm and dry.  Neurological:     Mental  Status: She is alert. Mental status is at baseline.    Imaging: Ct Abdomen Pelvis W Contrast  Result Date: 09/10/2019 CLINICAL DATA:  Leukocytosis. Recent laparoscopic cholecystectomy and ERCP. EXAM: CT ABDOMEN AND PELVIS WITH CONTRAST TECHNIQUE: Multidetector CT imaging of the abdomen and pelvis was performed using the standard protocol following bolus administration of intravenous contrast. CONTRAST:  100mL OMNIPAQUE IOHEXOL 300 MG/ML  SOLN COMPARISON:  CT abdomen from 09/04/2019. I also reviewed the patient's operative note and ERCP procedure note. FINDINGS: Lower chest: Mild atelectasis in both lower lobes. Coronary and aortic atherosclerotic vascular calcifications. Hepatobiliary: Bilobed cyst of the right hepatic lobe 2.8 by 2.1 cm on image 19/6. Other small hypodense lesions of the liver technically too small to characterize although may well represent small cysts. There is a 5.5 by 2.8 4.0 cm fluid collection along the cholecystectomy site with surrounding clips noted. No gas is present in this collection. There is a small amount of gas in the common bile duct compatible with sphincterotomy. No significant abnormal degree of enhancement along the extrahepatic biliary tree noted. Minimal intrahepatic biliary dilatation. Pancreas: Small amount of gas is present in the dorsal pancreatic duct which is borderline dilated although less so compared to 09/04/2019. The previously placed pancreatic duct stent has migrated distally appears to currently sit in the left-sided small bowel on image 50/9. Spleen: Unremarkable Adrenals/Urinary Tract: Hypodense bilateral renal lesions are compatible with cysts. Adrenal glands unremarkable. Stomach/Bowel: Scattered moderately dilated loops of small bowel extending down to a transition point in the central pelvis shown on images 69-72 of series 6, beyond which the small bowel is normal in caliber. Orally administered contrast is present in the colon but also there is  some progressive dilution of contrast in the small bowel loops. Vascular/Lymphatic: Aortoiliac atherosclerotic vascular disease. Mild prominence of left parametrial venous structures. Reproductive: Unremarkable Other: There is a collection of free fluid in the pelvis potentially with mild enhancing margins, measuring 7.4 by 4.1 by 6.4 cm (volume = 100 cm^3). There is no gas within this fluid collection but strictly speaking, abscess cannot be excluded. A smaller there is a small right paracentral presacral fluid collection which is likewise indeterminate for infection shown on image 70/6. Trace free fluid in the right paracolic gutter. No significant extraluminal gas. Musculoskeletal: Unremarkable IMPRESSION: 1. The previously placed pancreatic duct stent has migrated distally in the left-sided small bowel. 2. There is a 5.5 by 2.8 by 4.0 cm fluid collection along the cholecystectomy site with surrounding clips but no gas in this collection. This could represent a postoperative seroma, hematoma, or abscess. 3. 100 cubic cm collection of free fluid in the pelvis potentially with mild enhancing margins. There is no gas within this fluid collection but strictly speaking, abscess cannot be excluded. There is also a small right paracentral presacral fluid collection which is likewise indeterminate for infection. 4. Proximal small bowel dilatation with  transition point in the right central pelvis between mildly dilated and nondilated small bowel. However, there is dense contrast medium in the colon and accordingly I am skeptical of a high-grade small bowel obstruction. Given the angulation of the transition point, the possibility of a small adhesion causing low-grade/partial small bowel obstruction is raised. 5. Other imaging findings of potential clinical significance: Coronary atherosclerosis. Mild atelectasis in both lower lobes. Hepatic and renal cysts. Mild prominence of left parametrial venous structures, query  pelvic congestion syndrome. Pneumobilia compatible with prior sphincterotomy. Aortic Atherosclerosis (ICD10-I70.0). Electronically Signed   By: Gaylyn Rong M.D.   On: 09/10/2019 18:21   Dg Abd 2 Views  Result Date: 09/10/2019 CLINICAL DATA:  Follow-up pancreatic duct stent. EXAM: ABDOMEN - 2 VIEW COMPARISON:  Abdomen and pelvis CT dated 09/04/2019. ERCP dated 09/07/2019. Operative cholangiogram dated 09/05/2019. FINDINGS: Normal bowel gas pattern. Small caliber stent with a distal pigtail located right mid to lower abdomen. This is in the region of the distal common duct and pancreatic duct on the previous CT. Interval cholecystectomy clips. Lumbar and lower thoracic spine degenerative changes. IMPRESSION: Small caliber stent in the right mid to lower abdomen. This is in the region of the distal common duct and pancreatic duct on the previous CT. Electronically Signed   By: Beckie Salts M.D.   On: 09/10/2019 10:52   Ct Image Guided Drainage Percut Cath  Peritoneal Retroperit  Result Date: 09/11/2019 INDICATION: History of cholecystectomy (Dr. Luisa Hart), now with indeterminate fluid collections within the gallbladder fossa and pelvis. Please perform CT-guided aspiration and/or drainage catheter placement for infection source control purposes. EXAM: 1. CT-GUIDED RIGHT TRANS GLUTEAL APPROACH DRAINAGE CATHETER PLACEMENT. 2. ULTRASOUND AND CT-GUIDED GALLBLADDER FOSSA DRAINAGE CATHETER PLACEMENT COMPARISON:  CT abdomen pelvis-09/10/2019; 09/04/2019; ERCP-09/07/2019 intraoperative cholangiogram during laparoscopic cholecystectomy-09/05/2019 MEDICATIONS: The patient is currently admitted to the hospital and receiving intravenous antibiotics. The antibiotics were administered within an appropriate time frame prior to the initiation of the procedure. ANESTHESIA/SEDATION: Moderate (conscious) sedation was employed during this procedure. A total of Versed 2 mg and Fentanyl 100 mcg was administered intravenously.  Moderate Sedation Time: 20 minutes. The patient's level of consciousness and vital signs were monitored continuously by radiology nursing throughout the procedure under my direct supervision. CONTRAST:  None COMPLICATIONS: None immediate. PROCEDURE: Informed written consent was obtained from the patient after a discussion of the risks, benefits and alternatives to treatment. The patient was initially placed prone on the CT gantry and a pre procedural CT was performed re-demonstrating the known abscess/fluid collection within the lower pelvis with dominant ill-defined component measuring approximately 3.4 x 2.8 cm (image 30, series 3). The procedure was planned. A timeout was performed prior to the initiation of the procedure. The skin overlying the right buttocks was prepped and draped in the usual sterile fashion. The overlying soft tissues were anesthetized with 1% lidocaine with epinephrine. Appropriate trajectory was planned with the use of a 22 gauge spinal needle. An 18 gauge trocar needle was advanced into the abscess/fluid collection and a short Amplatz super stiff wire was coiled within the collection. Appropriate positioning was confirmed with a limited CT scan. The tract was serially dilated allowing placement of a 10 [place] all-purpose drainage catheter. Appropriate positioning was confirmed with a limited postprocedural CT scan. Approximately 15 ml of purulent fluid was aspirated. The tube was connected to a JP bulb and sutured in place. A dressing was placed. _________________________________________________________ Attention was now paid towards the indeterminate fluid collection within the gallbladder fossa.  The patient was positioned supine on the CT gantry with preprocedural imaging demonstrating an approximately 4.5 x 2.3 cm mixed attenuating fluid collection with the gallbladder fossa (image 45, series 2). The collection was identified sonographically. The skin overlying the right upper abdominal  quadrant was prepped and draped in usual sterile fashion. After lying soft tissues were anesthetized with 1% lidocaine with epinephrine, the collection was accessed with an 18 gauge trocar needle under direct ultrasound guidance. Ultrasound image was saved for procedural documentation purposes Next, a short Amplatz wire was coiled within the collection. Appropriate position was confirmed with limited CT imaging. The track was dilated allowing placement of a 10 French percutaneous drainage catheter. Appropriate drainage catheter positioning was confirmed with subsequent CT imaging and the drainage catheter was retracted with end coiled and locked with the peripheral aspect of the residual collection within the gallbladder fossa. Following drainage catheter placement, approximately 10 cc of bloody fluid was aspirated. Both drainage catheters were flushed with a small amount of saline, connected to JP bulbs and sutured in place. The patient tolerated the above procedures well without immediate post procedural complication. IMPRESSION: 1. Successful CT guided placement of a 10 [place] all purpose drain catheter into the pelvic fluid collection via right trans gluteal approach with aspiration of 15 mL of purulent fluid. Samples were sent to the laboratory as requested by the ordering clinical team. 2. Successful ultrasound and CT-guided placement of a 10 French all-purpose drainage catheter into the mixed attenuating fluid collection with gallbladder fossa yielding approximately 10 cc bloody fluid. PLAN: - Repeat IV only CT imaging may be performed once both drainage catheters yield less than 10 cc of output (excluding flushes) per day. - Once the pelvic fluid collection has resolved with CT imaging and output is less than 10 cc per day, the trans gluteal approach drain may be removed without dedicated drainage catheter injection given its remote location from the operative intervention. - would recommend fluoroscopic  guided drainage catheter injection of the gallbladder fossa could drain prior to consideration of removal. Electronically Signed   By: Simonne Come M.D.   On: 09/11/2019 14:30   Ct Image Guided Drainage Percut Cath  Peritoneal Retroperit  Result Date: 09/11/2019 INDICATION: History of cholecystectomy (Dr. Luisa Hart), now with indeterminate fluid collections within the gallbladder fossa and pelvis. Please perform CT-guided aspiration and/or drainage catheter placement for infection source control purposes. EXAM: 1. CT-GUIDED RIGHT TRANS GLUTEAL APPROACH DRAINAGE CATHETER PLACEMENT. 2. ULTRASOUND AND CT-GUIDED GALLBLADDER FOSSA DRAINAGE CATHETER PLACEMENT COMPARISON:  CT abdomen pelvis-09/10/2019; 09/04/2019; ERCP-09/07/2019 intraoperative cholangiogram during laparoscopic cholecystectomy-09/05/2019 MEDICATIONS: The patient is currently admitted to the hospital and receiving intravenous antibiotics. The antibiotics were administered within an appropriate time frame prior to the initiation of the procedure. ANESTHESIA/SEDATION: Moderate (conscious) sedation was employed during this procedure. A total of Versed 2 mg and Fentanyl 100 mcg was administered intravenously. Moderate Sedation Time: 20 minutes. The patient's level of consciousness and vital signs were monitored continuously by radiology nursing throughout the procedure under my direct supervision. CONTRAST:  None COMPLICATIONS: None immediate. PROCEDURE: Informed written consent was obtained from the patient after a discussion of the risks, benefits and alternatives to treatment. The patient was initially placed prone on the CT gantry and a pre procedural CT was performed re-demonstrating the known abscess/fluid collection within the lower pelvis with dominant ill-defined component measuring approximately 3.4 x 2.8 cm (image 30, series 3). The procedure was planned. A timeout was performed prior to the initiation of the procedure.  The skin overlying the right  buttocks was prepped and draped in the usual sterile fashion. The overlying soft tissues were anesthetized with 1% lidocaine with epinephrine. Appropriate trajectory was planned with the use of a 22 gauge spinal needle. An 18 gauge trocar needle was advanced into the abscess/fluid collection and a short Amplatz super stiff wire was coiled within the collection. Appropriate positioning was confirmed with a limited CT scan. The tract was serially dilated allowing placement of a 10 [place] all-purpose drainage catheter. Appropriate positioning was confirmed with a limited postprocedural CT scan. Approximately 15 ml of purulent fluid was aspirated. The tube was connected to a JP bulb and sutured in place. A dressing was placed. _________________________________________________________ Attention was now paid towards the indeterminate fluid collection within the gallbladder fossa. The patient was positioned supine on the CT gantry with preprocedural imaging demonstrating an approximately 4.5 x 2.3 cm mixed attenuating fluid collection with the gallbladder fossa (image 45, series 2). The collection was identified sonographically. The skin overlying the right upper abdominal quadrant was prepped and draped in usual sterile fashion. After lying soft tissues were anesthetized with 1% lidocaine with epinephrine, the collection was accessed with an 18 gauge trocar needle under direct ultrasound guidance. Ultrasound image was saved for procedural documentation purposes Next, a short Amplatz wire was coiled within the collection. Appropriate position was confirmed with limited CT imaging. The track was dilated allowing placement of a 10 French percutaneous drainage catheter. Appropriate drainage catheter positioning was confirmed with subsequent CT imaging and the drainage catheter was retracted with end coiled and locked with the peripheral aspect of the residual collection within the gallbladder fossa. Following drainage  catheter placement, approximately 10 cc of bloody fluid was aspirated. Both drainage catheters were flushed with a small amount of saline, connected to JP bulbs and sutured in place. The patient tolerated the above procedures well without immediate post procedural complication. IMPRESSION: 1. Successful CT guided placement of a 10 [place] all purpose drain catheter into the pelvic fluid collection via right trans gluteal approach with aspiration of 15 mL of purulent fluid. Samples were sent to the laboratory as requested by the ordering clinical team. 2. Successful ultrasound and CT-guided placement of a 10 French all-purpose drainage catheter into the mixed attenuating fluid collection with gallbladder fossa yielding approximately 10 cc bloody fluid. PLAN: - Repeat IV only CT imaging may be performed once both drainage catheters yield less than 10 cc of output (excluding flushes) per day. - Once the pelvic fluid collection has resolved with CT imaging and output is less than 10 cc per day, the trans gluteal approach drain may be removed without dedicated drainage catheter injection given its remote location from the operative intervention. - would recommend fluoroscopic guided drainage catheter injection of the gallbladder fossa could drain prior to consideration of removal. Electronically Signed   By: Simonne Come M.D.   On: 09/11/2019 14:30    Labs:  CBC: Recent Labs    09/09/19 0305 09/10/19 0248 09/11/19 0251 09/12/19 0306  WBC 10.2 17.3* 12.0* 10.6*  HGB 11.4* 11.7* 10.5* 11.4*  HCT 33.6* 34.5* 31.2* 33.9*  PLT 250 258 246 284    COAGS: Recent Labs    09/11/19 1006  INR 1.2    BMP: Recent Labs    09/07/19 0249 09/08/19 0524 09/09/19 0305 09/12/19 0306  NA 135 137 138 140  K 3.2* 3.5 2.8* 3.4*  CL 101 103 103 101  CO2 GLUCOSE 130*  145* 117* 87  BUN 9 13 10  <5*  CALCIUM 8.8* 8.8* 8.6* 8.8*  CREATININE 1.03* 1.07* 0.90 0.74  GFRNONAA 53* 50* >60 >60  GFRAA >60  58* >60 >60    LIVER FUNCTION TESTS: Recent Labs    09/07/19 0249 09/08/19 0524 09/09/19 0305 09/12/19 0306  BILITOT 0.7 0.5 0.2* 0.6  AST 103* 69* 58* 27  ALT 140* 101* 84* 40  ALKPHOS 329* 261* 218* 154*  PROT 5.7* 5.7* 5.6* 5.5*  ALBUMIN 2.1* 2.1* 2.1* 2.2*    Assessment and Plan:  78 y.o. female. History of HTN presented to [facility] with right sided abdominal pain. CT abd pelvis with contrast   From 11.14.20 found to have acute cholecystitis. s/p lap chole on 11.9.20 and ERCP with pancreatic duct stent on 11.11.20. Patient continued to have leukocytosis and CT abd pelvis on 11.14.20 found to have intra-abdominal abscess'  On 11.15.20 IR placed a 10 Fr  APDL to the gallbladder fossa and a 10 Fr APDL to the pelvic collection.   Per Epic output is:  Right buttock - 8 ml, 65 ml  RUQ - 15 ml, 32.5 ml  Recommend team continue with flushing TID, output recording q shift and dressing changes as needed. Would consider additional imaging when output is less than 10 ml for 24 hours not including flush material.   Electronically Signed: 11.17.20, NP 09/13/2019, 12:55 PM   I spent a total of 15 Minutes at the the patient's bedside AND on the patient's hospital floor or unit, greater than 50% of which was counseling/coordinating care for gallbladder fossa and pelvic drain

## 2019-09-13 NOTE — Progress Notes (Signed)
[facility]/Trauma Progress Note  6 Days Post-Op   Assessment/Plan HTN HLD  Acute Cholecystitis - S/p Lap Chole with IOC - Dr. Luisa Hart - 09/05/2019 -S/p ERCP with Dr. Ewing Schlein on 11/11.  - Will need flat and upright abdominal xray in 1-2 weeks to confirm passage of pancreatic stent per GI's note. Leukocytosis - CT 11/14 showed fluid collection of gb fossa and free fluid in pelvis, possible SBO - S/P IR drain placement x 2 - did not look bilious - WBC trending down and 10.6 today SBO seen on CT? - pt is having flatus and no N or V. Tolerating diet  FEN -Diet regular, bowel regimen VTE -SCDs, Lovenox ID -Cipro 11/8 - 11/17  Afebrile  Follow-up: DOW, GI  Plan: recheck WBC in am, possible discharge tomorrow?   LOS: 8 days    Subjective: CC: no complaints  No abdominal pain at rest. She denies N, V fever or chills. She is having flatus but no BM since admission. Tolerating diet.   Objective: Vital signs in last 24 hours: Temp:  [98 F (36.7 C)-98.7 F (37.1 C)] 98 F (36.7 C) (11/17 0449) Pulse Rate:  [72-81] 75 (11/17 0449) Resp:  [17] 17 (11/17 0449) BP: (143-161)/(66-69) 143/69 (11/17 0449) SpO2:  [98 %-100 %] 98 % (11/17 0449) Last BM Date: 11/08/18  Intake/Output from previous day: 11/16 0701 - 11/17 0700 In: 869 [P.O.:480; IV Piggyback:389] Out: 23 [Drains:23] Intake/Output this shift: No intake/output data recorded.  PE:  Gen:  Alert, NAD, pleasant, cooperative Card:  RRR Pulm:  CTA, no W/R/R, effort normal Abd: Soft, ND, +BS, incisions C/D/I, drains x 2 RUQ with minimal serosanguinous drainage, pelvic drain with scant purulent drainage. TTP right hemiabdomen without guarding Skin: no rashes noted, warm and dry   Anti-infectives: Anti-infectives (From admission, onward)   Start     Dose/Rate Route Frequency Ordered Stop   09/08/19 0600  ciprofloxacin (CIPRO) IVPB 400 mg  Status:  Discontinued     400 mg 200 mL/hr over 60 Minutes  Intravenous On call to O.R. 09/07/19 1311 09/07/19 1414   09/05/19 2300  ciprofloxacin (CIPRO) IVPB 400 mg     400 mg 200 mL/hr over 60 Minutes Intravenous Every 12 hours 09/05/19 1425 09/13/19 0459   09/04/19 2300  ciprofloxacin (CIPRO) IVPB 400 mg  Status:  Discontinued     400 mg 200 mL/hr over 60 Minutes Intravenous Every 12 hours 09/04/19 1314 09/05/19 1425   09/04/19 1115  ciprofloxacin (CIPRO) IVPB 400 mg     400 mg 200 mL/hr over 60 Minutes Intravenous  Once 09/04/19 1101 09/04/19 1433   09/04/19 1115  metroNIDAZOLE (FLAGYL) IVPB 500 mg     500 mg 100 mL/hr over 60 Minutes Intravenous  Once 09/04/19 1101 09/04/19 1334      Lab Results:  Recent Labs    09/11/19 0251 09/12/19 0306  WBC 12.0* 10.6*  HGB 10.5* 11.4*  HCT 31.2* 33.9*  PLT 246 284   BMET Recent Labs    09/12/19 0306  NA 140  K 3.4*  CL 101  CO2 24  GLUCOSE 87  BUN <5*  CREATININE 0.74  CALCIUM 8.8*   PT/INR Recent Labs    09/11/19 1006  LABPROT 15.4*  INR 1.2   CMP     Component Value Date/Time   NA 140 09/12/2019 0306   K 3.4 (L) 09/12/2019 0306   CL 101 09/12/2019 0306   CO2 24 09/12/2019 0306   GLUCOSE 87 09/12/2019 0306  BUN <5 (L) 09/12/2019 0306   CREATININE 0.74 09/12/2019 0306   CALCIUM 8.8 (L) 09/12/2019 0306   PROT 5.5 (L) 09/12/2019 0306   ALBUMIN 2.2 (L) 09/12/2019 0306   AST 27 09/12/2019 0306   ALT 40 09/12/2019 0306   ALKPHOS 154 (H) 09/12/2019 0306   BILITOT 0.6 09/12/2019 0306   GFRNONAA >60 09/12/2019 0306   GFRAA >60 09/12/2019 0306   Lipase     Component Value Date/Time   LIPASE 20 09/08/2019 0524    Studies/Results: Ct Image Guided Drainage Percut Cath  Peritoneal Retroperit  Result Date: 09/11/2019 INDICATION: History of cholecystectomy (Dr. Luisa Hartornett), now with indeterminate fluid collections within the gallbladder fossa and pelvis. Please perform CT-guided aspiration and/or drainage catheter placement for infection source control purposes. EXAM: 1.  CT-GUIDED RIGHT TRANS GLUTEAL APPROACH DRAINAGE CATHETER PLACEMENT. 2. ULTRASOUND AND CT-GUIDED GALLBLADDER FOSSA DRAINAGE CATHETER PLACEMENT COMPARISON:  CT abdomen pelvis-09/10/2019; 09/04/2019; ERCP-09/07/2019 intraoperative cholangiogram during laparoscopic cholecystectomy-09/05/2019 MEDICATIONS: The patient is currently admitted to the hospital and receiving intravenous antibiotics. The antibiotics were administered within an appropriate time frame prior to the initiation of the procedure. ANESTHESIA/SEDATION: Moderate (conscious) sedation was employed during this procedure. A total of Versed 2 mg and Fentanyl 100 mcg was administered intravenously. Moderate Sedation Time: 20 minutes. The patient's level of consciousness and vital signs were monitored continuously by radiology nursing throughout the procedure under my direct supervision. CONTRAST:  None COMPLICATIONS: None immediate. PROCEDURE: Informed written consent was obtained from the patient after a discussion of the risks, benefits and alternatives to treatment. The patient was initially placed prone on the CT gantry and a pre procedural CT was performed re-demonstrating the known abscess/fluid collection within the lower pelvis with dominant ill-defined component measuring approximately 3.4 x 2.8 cm (image 30, series 3). The procedure was planned. A timeout was performed prior to the initiation of the procedure. The skin overlying the right buttocks was prepped and draped in the usual sterile fashion. The overlying soft tissues were anesthetized with 1% lidocaine with epinephrine. Appropriate trajectory was planned with the use of a 22 gauge spinal needle. An 18 gauge trocar needle was advanced into the abscess/fluid collection and a short Amplatz super stiff wire was coiled within the collection. Appropriate positioning was confirmed with a limited CT scan. The tract was serially dilated allowing placement of a 10 [place] all-purpose drainage catheter.  Appropriate positioning was confirmed with a limited postprocedural CT scan. Approximately 15 ml of purulent fluid was aspirated. The tube was connected to a JP bulb and sutured in place. A dressing was placed. _________________________________________________________ Attention was now paid towards the indeterminate fluid collection within the gallbladder fossa. The patient was positioned supine on the CT gantry with preprocedural imaging demonstrating an approximately 4.5 x 2.3 cm mixed attenuating fluid collection with the gallbladder fossa (image 45, series 2). The collection was identified sonographically. The skin overlying the right upper abdominal quadrant was prepped and draped in usual sterile fashion. After lying soft tissues were anesthetized with 1% lidocaine with epinephrine, the collection was accessed with an 18 gauge trocar needle under direct ultrasound guidance. Ultrasound image was saved for procedural documentation purposes Next, a short Amplatz wire was coiled within the collection. Appropriate position was confirmed with limited CT imaging. The track was dilated allowing placement of a 10 French percutaneous drainage catheter. Appropriate drainage catheter positioning was confirmed with subsequent CT imaging and the drainage catheter was retracted with end coiled and locked with the peripheral aspect of the residual  collection within the gallbladder fossa. Following drainage catheter placement, approximately 10 cc of bloody fluid was aspirated. Both drainage catheters were flushed with a small amount of saline, connected to JP bulbs and sutured in place. The patient tolerated the above procedures well without immediate post procedural complication. IMPRESSION: 1. Successful CT guided placement of a 10 [place] all purpose drain catheter into the pelvic fluid collection via right trans gluteal approach with aspiration of 15 mL of purulent fluid. Samples were sent to the laboratory as requested by  the ordering clinical team. 2. Successful ultrasound and CT-guided placement of a 10 French all-purpose drainage catheter into the mixed attenuating fluid collection with gallbladder fossa yielding approximately 10 cc bloody fluid. PLAN: - Repeat IV only CT imaging may be performed once both drainage catheters yield less than 10 cc of output (excluding flushes) per day. - Once the pelvic fluid collection has resolved with CT imaging and output is less than 10 cc per day, the trans gluteal approach drain may be removed without dedicated drainage catheter injection given its remote location from the operative intervention. - would recommend fluoroscopic guided drainage catheter injection of the gallbladder fossa could drain prior to consideration of removal. Electronically Signed   By: Simonne Come M.D.   On: 09/11/2019 14:30   Ct Image Guided Drainage Percut Cath  Peritoneal Retroperit  Result Date: 09/11/2019 INDICATION: History of cholecystectomy (Dr. Luisa Hart), now with indeterminate fluid collections within the gallbladder fossa and pelvis. Please perform CT-guided aspiration and/or drainage catheter placement for infection source control purposes. EXAM: 1. CT-GUIDED RIGHT TRANS GLUTEAL APPROACH DRAINAGE CATHETER PLACEMENT. 2. ULTRASOUND AND CT-GUIDED GALLBLADDER FOSSA DRAINAGE CATHETER PLACEMENT COMPARISON:  CT abdomen pelvis-09/10/2019; 09/04/2019; ERCP-09/07/2019 intraoperative cholangiogram during laparoscopic cholecystectomy-09/05/2019 MEDICATIONS: The patient is currently admitted to the hospital and receiving intravenous antibiotics. The antibiotics were administered within an appropriate time frame prior to the initiation of the procedure. ANESTHESIA/SEDATION: Moderate (conscious) sedation was employed during this procedure. A total of Versed 2 mg and Fentanyl 100 mcg was administered intravenously. Moderate Sedation Time: 20 minutes. The patient's level of consciousness and vital signs were monitored  continuously by radiology nursing throughout the procedure under my direct supervision. CONTRAST:  None COMPLICATIONS: None immediate. PROCEDURE: Informed written consent was obtained from the patient after a discussion of the risks, benefits and alternatives to treatment. The patient was initially placed prone on the CT gantry and a pre procedural CT was performed re-demonstrating the known abscess/fluid collection within the lower pelvis with dominant ill-defined component measuring approximately 3.4 x 2.8 cm (image 30, series 3). The procedure was planned. A timeout was performed prior to the initiation of the procedure. The skin overlying the right buttocks was prepped and draped in the usual sterile fashion. The overlying soft tissues were anesthetized with 1% lidocaine with epinephrine. Appropriate trajectory was planned with the use of a 22 gauge spinal needle. An 18 gauge trocar needle was advanced into the abscess/fluid collection and a short Amplatz super stiff wire was coiled within the collection. Appropriate positioning was confirmed with a limited CT scan. The tract was serially dilated allowing placement of a 10 [place] all-purpose drainage catheter. Appropriate positioning was confirmed with a limited postprocedural CT scan. Approximately 15 ml of purulent fluid was aspirated. The tube was connected to a JP bulb and sutured in place. A dressing was placed. _________________________________________________________ Attention was now paid towards the indeterminate fluid collection within the gallbladder fossa. The patient was positioned supine on the CT gantry with preprocedural  imaging demonstrating an approximately 4.5 x 2.3 cm mixed attenuating fluid collection with the gallbladder fossa (image 45, series 2). The collection was identified sonographically. The skin overlying the right upper abdominal quadrant was prepped and draped in usual sterile fashion. After lying soft tissues were anesthetized  with 1% lidocaine with epinephrine, the collection was accessed with an 18 gauge trocar needle under direct ultrasound guidance. Ultrasound image was saved for procedural documentation purposes Next, a short Amplatz wire was coiled within the collection. Appropriate position was confirmed with limited CT imaging. The track was dilated allowing placement of a 10 French percutaneous drainage catheter. Appropriate drainage catheter positioning was confirmed with subsequent CT imaging and the drainage catheter was retracted with end coiled and locked with the peripheral aspect of the residual collection within the gallbladder fossa. Following drainage catheter placement, approximately 10 cc of bloody fluid was aspirated. Both drainage catheters were flushed with a small amount of saline, connected to JP bulbs and sutured in place. The patient tolerated the above procedures well without immediate post procedural complication. IMPRESSION: 1. Successful CT guided placement of a 10 [place] all purpose drain catheter into the pelvic fluid collection via right trans gluteal approach with aspiration of 15 mL of purulent fluid. Samples were sent to the laboratory as requested by the ordering clinical team. 2. Successful ultrasound and CT-guided placement of a 10 French all-purpose drainage catheter into the mixed attenuating fluid collection with gallbladder fossa yielding approximately 10 cc bloody fluid. PLAN: - Repeat IV only CT imaging may be performed once both drainage catheters yield less than 10 cc of output (excluding flushes) per day. - Once the pelvic fluid collection has resolved with CT imaging and output is less than 10 cc per day, the trans gluteal approach drain may be removed without dedicated drainage catheter injection given its remote location from the operative intervention. - would recommend fluoroscopic guided drainage catheter injection of the gallbladder fossa could drain prior to consideration of  removal. Electronically Signed   By: Sandi Mariscal M.D.   On: 09/11/2019 14:30     Kalman Drape, [facility] Surgery Please see amion for pager for the following: Cristine Polio, & Friday 7:00am - 4:30pm Thursdays 7:00am -11:30am

## 2019-09-13 NOTE — TOC Progression Note (Signed)
Transition of Care [facility]) - Progression Note    Patient Details  Name: XENA PROPST MRN: [id number] Date of Birth: 07/15/1941  Transition of Care [facility]) CM/SW Contact  Amybeth Sieg, Edson Snowball, RN Phone Number: 09/13/2019, 11:05 AM  Clinical Narrative:     Tommi Rumps with [facility] updated.   Called Zack with [place] to bring walker to hospital room .   Expected Discharge Plan: [place] Barriers to Discharge: Continued Medical Work up  Expected Discharge Plan and Services Expected Discharge Plan: [place]   Discharge Planning Services: CM Consult Post Acute Care Choice: Home Health, Durable Medical Equipment Living arrangements for the past 2 months: Single Family Home                 DME Arranged: Walker rolling DME Agency: [employer] Date DME Agency Contacted: 09/08/19 Time DME Agency Contacted: [phone] Representative spoke with at DME Agency: Zack will need to call on day of discharge [place] Arranged: PT [place]: [place] Date [place]: 09/08/19 Time [employer]: [id number] Representative spoke with at [place]: [place] ([place]) Interventions    Readmission Risk Interventions No flowsheet data found.

## 2019-09-14 ENCOUNTER — Other Ambulatory Visit: Payer: Self-pay | Admitting: Radiology

## 2019-09-14 DIAGNOSIS — T8143XA Infection following a procedure, organ and space surgical site, initial encounter: Secondary | ICD-10-CM

## 2019-09-14 MED ORDER — SODIUM CHLORIDE 0.9% FLUSH
5.0000 mL | Freq: Three times a day (TID) | INTRAVENOUS | Status: DC
  Administered 2019-09-14: 5 mL

## 2019-09-14 MED ORDER — OXYCODONE HCL 5 MG PO TABS: 5.0000 mg | ORAL_TABLET | Freq: Four times a day (QID) | ORAL | 0 refills | Status: DC | PRN

## 2019-09-14 MED ORDER — TIZANIDINE HCL 4 MG PO TABS: 4.0000 mg | ORAL_TABLET | Freq: Three times a day (TID) | ORAL | 0 refills | Status: DC | PRN

## 2019-09-14 MED ORDER — DOCUSATE SODIUM 100 MG PO CAPS: 100.0000 mg | ORAL_CAPSULE | Freq: Every day | ORAL | Status: DC | PRN

## 2019-09-14 NOTE — Progress Notes (Signed)
Still needs order for flushing the drains per IR notes. Will endorse.

## 2019-09-14 NOTE — Care Management Important Message (Signed)
Important Message  Patient Details  Name: Samantha Gonzales MRN: [id number] Date of Birth: 1941/04/18   Medicare Important Message Given:  Yes     Shelda Altes 09/14/2019, 3:31 PM

## 2019-09-14 NOTE — Progress Notes (Signed)
7 Days Post-Op   Subjective/Chief Complaint: Still with some pain Tolerating po Vitals normal, labs normal   Objective: Vital signs in last 24 hours: Temp:  [98.1 F (36.7 C)-99.2 F (37.3 C)] 98.1 F (36.7 C) (11/18 0521) Pulse Rate:  [81-92] 81 (11/18 0521) Resp:  [18] 18 (11/18 0521) BP: (145-159)/(67-72) 159/67 (11/18 0521) SpO2:  [98 %-100 %] 100 % (11/18 0521) Last BM Date: 11/08/18  Intake/Output from previous day: 11/17 0701 - 11/18 0700 In: 600 [P.O.:600] Out: 810 [Urine:800; Drains:10] Intake/Output this shift: No intake/output data recorded.  Exam: Awake and alert Looks completely comfortable Abdomen soft, almost non-tender, drains stable  Lab Results:  Recent Labs    09/12/19 0306  WBC 10.6*  HGB 11.4*  HCT 33.9*  PLT 284   BMET Recent Labs    09/12/19 0306  NA 140  K 3.4*  CL 101  CO2 24  GLUCOSE 87  BUN <5*  CREATININE 0.74  CALCIUM 8.8*   PT/INR Recent Labs    09/11/19 1006  LABPROT 15.4*  INR 1.2   ABG No results for input(s): PHART, HCO3 in the last 72 hours.  Invalid input(s): PCO2, PO2  Studies/Results: No results found.  Anti-infectives: Anti-infectives (From admission, onward)   Start     Dose/Rate Route Frequency Ordered Stop   09/08/19 0600  ciprofloxacin (CIPRO) IVPB 400 mg  Status:  Discontinued     400 mg 200 mL/hr over 60 Minutes Intravenous On call to O.R. 09/07/19 1311 09/07/19 1414   09/05/19 2300  ciprofloxacin (CIPRO) IVPB 400 mg     400 mg 200 mL/hr over 60 Minutes Intravenous Every 12 hours 09/05/19 1425 09/13/19 0459   09/04/19 2300  ciprofloxacin (CIPRO) IVPB 400 mg  Status:  Discontinued     400 mg 200 mL/hr over 60 Minutes Intravenous Every 12 hours 09/04/19 1314 09/05/19 1425   09/04/19 1115  ciprofloxacin (CIPRO) IVPB 400 mg     400 mg 200 mL/hr over 60 Minutes Intravenous  Once 09/04/19 1101 09/04/19 1433   09/04/19 1115  metroNIDAZOLE (FLAGYL) IVPB 500 mg     500 mg 100 mL/hr over 60  Minutes Intravenous  Once 09/04/19 1101 09/04/19 1334      Assessment/Plan: s/p Procedure(s): ENDOSCOPIC RETROGRADE CHOLANGIOPANCREATOGRAPHY (ERCP) (N/A) SPHINCTEROTOMY PANCREATIC STENT PLACEMENT REMOVAL OF STONES  Discharge home with drains in place Follow-up at [facility]  Follow-up with IR drain clinic  LOS: 9 days    Coralie Keens 09/14/2019

## 2019-09-14 NOTE — Discharge Summary (Signed)
[facility] Discharge Summary   Patient ID: WESLEE PRESTAGE MRN: [id number] DOB/AGE: 1941-04-26 78 y.o.  Admit date: 09/04/2019 Discharge date: 09/14/2019  Admitting Diagnosis: Cholecystitis  Discharge Diagnosis Patient Active Problem List   Diagnosis Date Noted  . Malnutrition of moderate degree 09/07/2019  . Cholecystitis 09/04/2019  . Altered mental status 01/18/2018  . Altered mental state 01/17/2018    Consultants Gastroenterology Interventional radiology  Imaging: No results found.  Procedures Dr. Luisa Hart (09/05/19) - Laparoscopic Cholecystectomy with IOC Dr. Ewing Schlein (09/07/19) - ERCP Dr. Grace Isaac (09/11/19) - CT guided drain placement x2  Hospital Course:  Patient is a 78 year old female who [facility] with right sided abdominal pain.  Workup showed acute cholecystitis.  Patient was admitted and underwent procedure listed above.  Tolerated procedure well. Cholangiogram was positive and GI was consulted for ERCP which occurred 11/11 as listed above. Patient had persistent pain after OR and ERCP, CT ordered 11/14 which showed post-operative fluid collection in gallbladder fossa and another collection in the pelvis. IR consulted for percutaneous drain placement.   On POD#9, the patient was voiding well, tolerating diet, ambulating well, pain well controlled, vital signs stable, incisions c/d/i and felt stable for discharge home.  Patient will follow up in our office in 2 weeks and knows to call with questions or concerns. She will call to confirm appointment date/time.     Allergies as of 09/14/2019      Reactions   Penicillins Hives, Itching, Rash   Has patient had a PCN reaction causing immediate rash, facial/tongue/throat swelling, SOB or lightheadedness with hypotension: Yes Has patient had a PCN reaction causing severe rash involving mucus membranes or skin necrosis: yes Has patient had a PCN reaction that required hospitalization: unk Has patient  had a PCN reaction occurring within the last 10 years: unk If all of the above answers are "NO", then may proceed with Cephalosporin use.      Medication List    STOP taking these medications   HYDROcodone-acetaminophen 7.5-325 MG tablet Commonly known as: NORCO     TAKE these medications   acetaminophen 325 MG tablet Commonly known as: TYLENOL Take 650 mg by mouth every 6 (six) hours as needed for mild pain or moderate pain. Notes to patient: Not earlier than 2:30PM   amLODipine 5 MG tablet Commonly known as: NORVASC Take 1 tablet (5 mg total) by mouth daily. What changed: how much to take Notes to patient: Resume to your regular schedule   B-12 1000 MCG Subl Place 1,000 mcg under the tongue daily. Notes to patient: Resume to your regular schedule   docusate sodium 100 MG capsule Commonly known as: COLACE Take 1 capsule (100 mg total) by mouth daily as needed for mild constipation.   lactulose 10 GM/15ML solution Commonly known as: CHRONULAC Take 30 mLs (20 g total) by mouth 2 (two) times daily as needed for mild constipation. For daily bowel movements   oxyCODONE 5 MG immediate release tablet Commonly known as: Oxy IR/ROXICODONE Take 1-2 tablets (5-10 mg total) by mouth every 6 (six) hours as needed for severe pain. Notes to patient: Not earlier than 3:00 PM   pantoprazole 40 MG tablet Commonly known as: PROTONIX Take 1 tablet (40 mg total) by mouth 2 (two) times daily. Notes to patient: Resume to your regular schedule   rosuvastatin 20 MG tablet Commonly known as: CRESTOR Take 20 mg by mouth daily. Notes to patient: Resume to your regular schedule   thiamine  100 MG tablet Take 1 tablet (100 mg total) by mouth daily. Notes to patient: Resume to your regular schedule   tiZANidine 4 MG tablet Commonly known as: ZANAFLEX Take 1 tablet (4 mg total) by mouth every 8 (eight) hours as needed for muscle spasms.            Durable Medical Equipment  (From  admission, onward)         Start     Ordered   09/08/19 1552  For home use only DME Walker rolling  Once    Question:  Patient needs a walker to treat with the following condition  Answer:  Weakness   09/08/19 1552           Follow-up [facility] Go on 09/27/2019.   Specialty: General Surgery Why: Your appointment is 12/1 at 9:15am Please arrive 30 minutes prior to your appointment to check in and fill out paperwork. Bring photo ID and insurance information. Contact information: [address] [place] [phone]       Clarene Essex, MD. Call.   Specialty: Gastroenterology Why: As needed  Contact information: [address] [place] [address] [phone]        Sandi Mariscal, MD. Go in 2 week(s).   Specialties: Interventional Radiology, Radiology Why: Clinic will call you to schedule follow up CT and visit. Contact information: [address]  [place] [address] [phone]           Signed: Brigid Re, [facility] 09/14/2019, 3:56 PM Please see Amion for pager number during day hours 7:00am-4:30pm

## 2019-09-14 NOTE — Progress Notes (Signed)
Patient ID: Samantha Gonzales, female   DOB: February 07, 1941, 78 y.o.   MRN: [id number]    Referring Physician(s): MD - Twana First  Supervising Physician: Irish Lack  Patient Status:  [facility] - In-pt  Chief Complaint:  Right sided abdominal pain found to have acute cholecysytsis   Subjective: Pt ok. States she is for discharge today  On 11.15.20 IR placed a 10 Fr APDL in the pelvic fluid collection and the gallbladder fossa  Patient alert and laying in bed. Denies any fevers, headache, chest pain, SOB, cough, abdominal pain, nausea, vomiting or bleeding.    Allergies: Penicillins  Medications:  Current Facility-Administered Medications:    acetaminophen (TYLENOL) tablet 650 mg, 650 mg, Oral, Q6H PRN, 650 mg at 09/14/19 0824 **OR** acetaminophen (TYLENOL) suppository 650 mg, 650 mg, Rectal, Q6H PRN, Vida Rigger, MD   chlorhexidine (PERIDEX) 0.12 % solution 15 mL, 15 mL, Mouth Rinse, BID, Magod, Vernia Buff, MD, 15 mL at 09/14/19 0923   dextrose 5 % and 0.45 % NaCl with KCl 20 mEq/L infusion, , Intravenous, Continuous, Magod, Vernia Buff, MD, Stopped at 09/12/19 1717   diphenhydrAMINE (BENADRYL) 12.5 MG/5ML elixir 12.5 mg, 12.5 mg, Oral, Q6H PRN **OR** [DISCONTINUED] diphenhydrAMINE (BENADRYL) injection 12.5 mg, 12.5 mg, Intravenous, Q6H PRN, Vida Rigger, MD   docusate sodium (COLACE) capsule 100 mg, 100 mg, Oral, BID, Jacinto Halim, PA-C, 100 mg at 09/14/19 0923   enoxaparin (LOVENOX) injection 40 mg, 40 mg, Subcutaneous, Q24H, Magod, Vernia Buff, MD, 40 mg at 09/13/19 1216   feeding supplement (ENSURE ENLIVE) (ENSURE ENLIVE) liquid 237 mL, 237 mL, Oral, TID BM, Violeta Gelinas, MD, 237 mL at 09/13/19 1008   HYDROmorphone (DILAUDID) injection 1 mg, 1 mg, Intravenous, Q2H PRN, Cornett, Maisie Fus, MD, 1 mg at 09/11/19 0959   MEDLINE mouth rinse, 15 mL, Mouth Rinse, q12n4p, Magod, Vernia Buff, MD, 15 mL at 09/13/19 1622   methocarbamol (ROBAXIN) tablet 500 mg, 500 mg, Oral, Q6H PRN, Jacinto Halim,  PA-C, 500 mg at 09/14/19 4098   metoprolol tartrate (LOPRESSOR) injection 5 mg, 5 mg, Intravenous, Q6H PRN, Vida Rigger, MD   multivitamin with minerals tablet 1 tablet, 1 tablet, Oral, Daily, Violeta Gelinas, MD, 1 tablet at 09/14/19 0923   ondansetron (ZOFRAN-ODT) disintegrating tablet 4 mg, 4 mg, Oral, Q6H PRN **OR** ondansetron (ZOFRAN) injection 4 mg, 4 mg, Intravenous, Q6H PRN, Vida Rigger, MD, 4 mg at 09/07/19 0340   oxyCODONE (Oxy IR/ROXICODONE) immediate release tablet 5-10 mg, 5-10 mg, Oral, Q4H PRN, Vida Rigger, MD, 10 mg at 09/14/19 1191   polyethylene glycol (MIRALAX / GLYCOLAX) packet 17 g, 17 g, Oral, Daily, Jacinto Halim, PA-C, 17 g at 09/14/19 4782   sodium chloride flush (NS) 0.9 % injection 3 mL, 3 mL, Intravenous, Once, Vida Rigger, MD, Stopped at 09/04/19 0919   sodium chloride flush (NS) 0.9 % injection 5 mL, 5 mL, Intracatheter, Q8H, Simonne Come, MD    Vital Signs: BP (!) 159/67 (BP Location: Right Arm)    Pulse 81    Temp 98.1 F (36.7 C) (Oral)    Resp 18    Ht 6' (1.829 m) Comment: per 01/21/18 encounter   Wt 81.6 kg Comment: from 01/21/18 encounter   SpO2 100%    BMI 24.40 kg/m   Physical Exam Vitals signs and nursing note reviewed.  Constitutional:      General: She is not in acute distress. Cardiovascular:     Rate and Rhythm: Normal rate.  Pulmonary:     Effort: Pulmonary effort  is normal. No respiratory distress.  Abdominal:     Comments: RUQ drain to suction serosanguinous output. scant amount of fluid noted. Stat loc and suture intact.  Dressing is clean dry and intact. No eryethema, edema, drainage or bleeding noted at exit site.   Transgluteal drain to suction cloudy serous output. Scant amount of fluid noted. Stat loc and suture intact.  Dressing is clean dry and intact. No erythema, edema, drainage or bleeding noted at exit site.   Skin:    General: Skin is warm and dry.  Neurological:     Mental Status: She is alert. Mental status is at  baseline.    Imaging: Ct Abdomen Pelvis W Contrast  Result Date: 09/10/2019 CLINICAL DATA:  Leukocytosis. Recent laparoscopic cholecystectomy and ERCP. EXAM: CT ABDOMEN AND PELVIS WITH CONTRAST TECHNIQUE: Multidetector CT imaging of the abdomen and pelvis was performed using the standard protocol following bolus administration of intravenous contrast. CONTRAST:  OMNIPAQUE IOHEXOL 300 MG/ML  SOLN COMPARISON:  CT abdomen from 09/04/2019. I also reviewed the patient's operative note and ERCP procedure note. FINDINGS: Lower chest: Mild atelectasis in both lower lobes. Coronary and aortic atherosclerotic vascular calcifications. Hepatobiliary: Bilobed cyst of the right hepatic lobe 2.8 by 2.1 cm on image 19/6. Other small hypodense lesions of the liver technically too small to characterize although may well represent small cysts. There is a 5.5 by 2.8 4.0 cm fluid collection along the cholecystectomy site with surrounding clips noted. No gas is present in this collection. There is a small amount of gas in the common bile duct compatible with sphincterotomy. No significant abnormal degree of enhancement along the extrahepatic biliary tree noted. Minimal intrahepatic biliary dilatation. Pancreas: Small amount of gas is present in the dorsal pancreatic duct which is borderline dilated although less so compared to 09/04/2019. The previously placed pancreatic duct stent has migrated distally appears to currently sit in the left-sided small bowel on image 50/9. Spleen: Unremarkable Adrenals/Urinary Tract: Hypodense bilateral renal lesions are compatible with cysts. Adrenal glands unremarkable. Stomach/Bowel: Scattered moderately dilated loops of small bowel extending down to a transition point in the central pelvis shown on images 69-72 of series 6, beyond which the small bowel is normal in caliber. Orally administered contrast is present in the colon but also there is some progressive dilution of contrast in the  small bowel loops. Vascular/Lymphatic: Aortoiliac atherosclerotic vascular disease. Mild prominence of left parametrial venous structures. Reproductive: Unremarkable Other: There is a collection of free fluid in the pelvis potentially with mild enhancing margins, measuring 7.4 by 4.1 by 6.4 cm (volume = 100 cm^3). There is no gas within this fluid collection but strictly speaking, abscess cannot be excluded. A smaller there is a small right paracentral presacral fluid collection which is likewise indeterminate for infection shown on image 70/6. Trace free fluid in the right paracolic gutter. No significant extraluminal gas. Musculoskeletal: Unremarkable IMPRESSION: 1. The previously placed pancreatic duct stent has migrated distally in the left-sided small bowel. 2. There is a 5.5 by 2.8 by 4.0 cm fluid collection along the cholecystectomy site with surrounding clips but no gas in this collection. This could represent a postoperative seroma, hematoma, or abscess. 3. 100 cubic cm collection of free fluid in the pelvis potentially with mild enhancing margins. There is no gas within this fluid collection but strictly speaking, abscess cannot be excluded. There is also a small right paracentral presacral fluid collection which is likewise indeterminate for infection. 4. Proximal small bowel dilatation with transition  point in the right central pelvis between mildly dilated and nondilated small bowel. However, there is dense contrast medium in the colon and accordingly I am skeptical of a high-grade small bowel obstruction. Given the angulation of the transition point, the possibility of a small adhesion causing low-grade/partial small bowel obstruction is raised. 5. Other imaging findings of potential clinical significance: Coronary atherosclerosis. Mild atelectasis in both lower lobes. Hepatic and renal cysts. Mild prominence of left parametrial venous structures, query pelvic congestion syndrome. Pneumobilia  compatible with prior sphincterotomy. Aortic Atherosclerosis (ICD10-I70.0). Electronically Signed   By: Gaylyn Rong M.D.   On: 09/10/2019 18:21   Ct Image Guided Drainage Percut Cath  Peritoneal Retroperit  Result Date: 09/11/2019 INDICATION: History of cholecystectomy (Dr. Luisa Hart), now with indeterminate fluid collections within the gallbladder fossa and pelvis. Please perform CT-guided aspiration and/or drainage catheter placement for infection source control purposes. EXAM: 1. CT-GUIDED RIGHT TRANS GLUTEAL APPROACH DRAINAGE CATHETER PLACEMENT. 2. ULTRASOUND AND CT-GUIDED GALLBLADDER FOSSA DRAINAGE CATHETER PLACEMENT COMPARISON:  CT abdomen pelvis-09/10/2019; 09/04/2019; ERCP-09/07/2019 intraoperative cholangiogram during laparoscopic cholecystectomy-09/05/2019 MEDICATIONS: The patient is currently admitted to the hospital and receiving intravenous antibiotics. The antibiotics were administered within an appropriate time frame prior to the initiation of the procedure. ANESTHESIA/SEDATION: Moderate (conscious) sedation was employed during this procedure. A total of Versed 2 mg and Fentanyl 100 mcg was administered intravenously. Moderate Sedation Time: 20 minutes. The patient's level of consciousness and vital signs were monitored continuously by radiology nursing throughout the procedure under my direct supervision. CONTRAST:  None COMPLICATIONS: None immediate. PROCEDURE: Informed written consent was obtained from the patient after a discussion of the risks, benefits and alternatives to treatment. The patient was initially placed prone on the CT gantry and a pre procedural CT was performed re-demonstrating the known abscess/fluid collection within the lower pelvis with dominant ill-defined component measuring approximately 3.4 x 2.8 cm (image 30, series 3). The procedure was planned. A timeout was performed prior to the initiation of the procedure. The skin overlying the right buttocks was prepped  and draped in the usual sterile fashion. The overlying soft tissues were anesthetized with 1% lidocaine with epinephrine. Appropriate trajectory was planned with the use of a 22 gauge spinal needle. An 18 gauge trocar needle was advanced into the abscess/fluid collection and a short Amplatz super stiff wire was coiled within the collection. Appropriate positioning was confirmed with a limited CT scan. The tract was serially dilated allowing placement of a 10 [place] all-purpose drainage catheter. Appropriate positioning was confirmed with a limited postprocedural CT scan. Approximately 15 ml of purulent fluid was aspirated. The tube was connected to a JP bulb and sutured in place. A dressing was placed. _________________________________________________________ Attention was now paid towards the indeterminate fluid collection within the gallbladder fossa. The patient was positioned supine on the CT gantry with preprocedural imaging demonstrating an approximately 4.5 x 2.3 cm mixed attenuating fluid collection with the gallbladder fossa (image 45, series 2). The collection was identified sonographically. The skin overlying the right upper abdominal quadrant was prepped and draped in usual sterile fashion. After lying soft tissues were anesthetized with 1% lidocaine with epinephrine, the collection was accessed with an 18 gauge trocar needle under direct ultrasound guidance. Ultrasound image was saved for procedural documentation purposes Next, a short Amplatz wire was coiled within the collection. Appropriate position was confirmed with limited CT imaging. The track was dilated allowing placement of a 10 French percutaneous drainage catheter. Appropriate drainage catheter positioning was confirmed with subsequent CT  imaging and the drainage catheter was retracted with end coiled and locked with the peripheral aspect of the residual collection within the gallbladder fossa. Following drainage catheter placement,  approximately 10 cc of bloody fluid was aspirated. Both drainage catheters were flushed with a small amount of saline, connected to JP bulbs and sutured in place. The patient tolerated the above procedures well without immediate post procedural complication. IMPRESSION: 1. Successful CT guided placement of a 10 [place] all purpose drain catheter into the pelvic fluid collection via right trans gluteal approach with aspiration of 15 mL of purulent fluid. Samples were sent to the laboratory as requested by the ordering clinical team. 2. Successful ultrasound and CT-guided placement of a 10 French all-purpose drainage catheter into the mixed attenuating fluid collection with gallbladder fossa yielding approximately 10 cc bloody fluid. PLAN: - Repeat IV only CT imaging may be performed once both drainage catheters yield less than 10 cc of output (excluding flushes) per day. - Once the pelvic fluid collection has resolved with CT imaging and output is less than 10 cc per day, the trans gluteal approach drain may be removed without dedicated drainage catheter injection given its remote location from the operative intervention. - would recommend fluoroscopic guided drainage catheter injection of the gallbladder fossa could drain prior to consideration of removal. Electronically Signed   By: Sandi Mariscal M.D.   On: 09/11/2019 14:30   Ct Image Guided Drainage Percut Cath  Peritoneal Retroperit  Result Date: 09/11/2019 INDICATION: History of cholecystectomy (Dr. Brantley Stage), now with indeterminate fluid collections within the gallbladder fossa and pelvis. Please perform CT-guided aspiration and/or drainage catheter placement for infection source control purposes. EXAM: 1. CT-GUIDED RIGHT TRANS GLUTEAL APPROACH DRAINAGE CATHETER PLACEMENT. 2. ULTRASOUND AND CT-GUIDED GALLBLADDER FOSSA DRAINAGE CATHETER PLACEMENT COMPARISON:  CT abdomen pelvis-09/10/2019; 09/04/2019; ERCP-09/07/2019 intraoperative cholangiogram during  laparoscopic cholecystectomy-09/05/2019 MEDICATIONS: The patient is currently admitted to the hospital and receiving intravenous antibiotics. The antibiotics were administered within an appropriate time frame prior to the initiation of the procedure. ANESTHESIA/SEDATION: Moderate (conscious) sedation was employed during this procedure. A total of Versed 2 mg and Fentanyl 100 mcg was administered intravenously. Moderate Sedation Time: 20 minutes. The patient's level of consciousness and vital signs were monitored continuously by radiology nursing throughout the procedure under my direct supervision. CONTRAST:  None COMPLICATIONS: None immediate. PROCEDURE: Informed written consent was obtained from the patient after a discussion of the risks, benefits and alternatives to treatment. The patient was initially placed prone on the CT gantry and a pre procedural CT was performed re-demonstrating the known abscess/fluid collection within the lower pelvis with dominant ill-defined component measuring approximately 3.4 x 2.8 cm (image 30, series 3). The procedure was planned. A timeout was performed prior to the initiation of the procedure. The skin overlying the right buttocks was prepped and draped in the usual sterile fashion. The overlying soft tissues were anesthetized with 1% lidocaine with epinephrine. Appropriate trajectory was planned with the use of a 22 gauge spinal needle. An 18 gauge trocar needle was advanced into the abscess/fluid collection and a short Amplatz super stiff wire was coiled within the collection. Appropriate positioning was confirmed with a limited CT scan. The tract was serially dilated allowing placement of a 10 [place] all-purpose drainage catheter. Appropriate positioning was confirmed with a limited postprocedural CT scan. Approximately 15 ml of purulent fluid was aspirated. The tube was connected to a JP bulb and sutured in place. A dressing was placed.  _________________________________________________________ Attention was now paid  towards the indeterminate fluid collection within the gallbladder fossa. The patient was positioned supine on the CT gantry with preprocedural imaging demonstrating an approximately 4.5 x 2.3 cm mixed attenuating fluid collection with the gallbladder fossa (image 45, series 2). The collection was identified sonographically. The skin overlying the right upper abdominal quadrant was prepped and draped in usual sterile fashion. After lying soft tissues were anesthetized with 1% lidocaine with epinephrine, the collection was accessed with an 18 gauge trocar needle under direct ultrasound guidance. Ultrasound image was saved for procedural documentation purposes Next, a short Amplatz wire was coiled within the collection. Appropriate position was confirmed with limited CT imaging. The track was dilated allowing placement of a 10 French percutaneous drainage catheter. Appropriate drainage catheter positioning was confirmed with subsequent CT imaging and the drainage catheter was retracted with end coiled and locked with the peripheral aspect of the residual collection within the gallbladder fossa. Following drainage catheter placement, approximately 10 cc of bloody fluid was aspirated. Both drainage catheters were flushed with a small amount of saline, connected to JP bulbs and sutured in place. The patient tolerated the above procedures well without immediate post procedural complication. IMPRESSION: 1. Successful CT guided placement of a 10 [place] all purpose drain catheter into the pelvic fluid collection via right trans gluteal approach with aspiration of 15 mL of purulent fluid. Samples were sent to the laboratory as requested by the ordering clinical team. 2. Successful ultrasound and CT-guided placement of a 10 French all-purpose drainage catheter into the mixed attenuating fluid collection with gallbladder fossa yielding approximately  10 cc bloody fluid. PLAN: - Repeat IV only CT imaging may be performed once both drainage catheters yield less than 10 cc of output (excluding flushes) per day. - Once the pelvic fluid collection has resolved with CT imaging and output is less than 10 cc per day, the trans gluteal approach drain may be removed without dedicated drainage catheter injection given its remote location from the operative intervention. - would recommend fluoroscopic guided drainage catheter injection of the gallbladder fossa could drain prior to consideration of removal. Electronically Signed   By: Simonne ComeJohn  Watts M.D.   On: 09/11/2019 14:30    Labs:  CBC: Recent Labs    09/09/19 0305 09/10/19 0248 09/11/19 0251 09/12/19 0306  WBC 10.2 17.3* 12.0* 10.6*  HGB 11.4* 11.7* 10.5* 11.4*  HCT 33.6* 34.5* 31.2* 33.9*  PLT 250 258 246 284    COAGS: Recent Labs    09/11/19 1006  INR 1.2    BMP: Recent Labs    09/07/19 0249 09/08/19 0524 09/09/19 0305 09/12/19 0306  NA 135 137 138 140  K 3.2* 3.5 2.8* 3.4*  CL 101 103 103 101  CO2 24 24 25 24   GLUCOSE 130* 145* 117* 87  BUN 9 13 10  <5*  CALCIUM 8.8* 8.8* 8.6* 8.8*  CREATININE 1.03* 1.07* 0.90 0.74  GFRNONAA 53* 50* >60 >60  GFRAA >60 58* >60 >60    LIVER FUNCTION TESTS: Recent Labs    09/07/19 0249 09/08/19 0524 09/09/19 0305 09/12/19 0306  BILITOT 0.7 0.5 0.2* 0.6  AST 103* 69* 58* 27  ALT 140* 101* 84* 40  ALKPHOS 329* 261* 218* 154*  PROT 5.7* 5.7* 5.6* 5.5*  ALBUMIN 2.1* 2.1* 2.1* 2.2*    Assessment and Plan:  78 y.o. female. [facility] of [facility] with right sided abdominal pain. CT abd pelvis with contrast   From 11.14.20 found to have acute cholecystitis.  s/p lap chole on 11.9.20 and ERCP with pancreatic duct stent on 11.11.20. Patient continued to have leukocytosis and CT abd pelvis on 11.14.20 found to have intra-abdominal abscess'  On 11.15.20 IR placed a 10 Fr  APDL to the gallbladder fossa and a 10 Fr APDL to  the pelvic collection.  Drain stable, good function Will leave Rx for flushes. To be done once daily. Daughter to come in today for drain care/flush instructions prior to discharge. Will arrange outpt follow up CT and visit with IR clinic.  Electronically Signed: Brayton El, PA-C 09/14/2019, 11:15 AM   I spent a total of 15 Minutes at the the patient's bedside AND on the patient's hospital floor or unit, greater than 50% of which was counseling/coordinating care for gallbladder fossa and pelvic drain

## 2019-09-14 NOTE — Progress Notes (Signed)
Physical Therapy Treatment Patient Details Name: Samantha Gonzales MRN: [id number] DOB: 1941-02-06 Today's Date: 09/14/2019    History of Present Illness Pt is a 78 y/o female admitted secondary to abdominal pain and found to have cholecystitis, now s/p ex lap and ERCP. PMH including but not limited to HTN.    PT Comments    Continuing work on functional mobility and activity tolerance;  Her RW was delivered to the room, and checked for size; REports a bit tired today, and she declined in hallways ambulation; she is excited to get to go home today   Follow Up Recommendations  Home health PT;Supervision/Assistance - 24 hour     Equipment Recommendations  Rolling walker with 5" wheels(delivered to room)    Recommendations for Other Services       Precautions / Restrictions Precautions Precautions: Fall Precaution Comments: Fall risk reatly reduced with use of RW    Mobility  Bed Mobility Overal bed mobility: Needs Assistance       Supine to sit: Supervision     General bed mobility comments: for safety  Transfers Overall transfer level: Needs assistance Equipment used: Rolling walker (2 wheeled) Transfers: Sit to/from Stand Sit to Stand: Min guard         General transfer comment: Cues for ahnd placement  Ambulation/Gait Ambulation/Gait assistance: Min guard Gait Distance (Feet): 20 Feet(to and from the bathroom) Assistive device: Rolling walker (2 wheeled) Gait Pattern/deviations: Step-through pattern     General Gait Details: Good use of RW for steadiness   Stairs             Wheelchair Mobility    Modified Rankin (Stroke Patients Only)       Balance     Sitting balance-Leahy Scale: Good       Standing balance-Leahy Scale: Fair                              Cognition Arousal/Alertness: Awake/alert Behavior During Therapy: WFL for tasks assessed/performed Overall Cognitive Status: Within Functional Limits for tasks  assessed(for simple mobility tasks)                                 General Comments: Ms. Decker seemded to enjoy talking about her childhood and her mother      Exercises      General Comments        Pertinent Vitals/Pain Pain Assessment: Faces Faces Pain Scale: Hurts a little bit Pain Location: abdomen; R side Pain Descriptors / Indicators: Grimacing Pain Intervention(s): Monitored during session    Home Living                      Prior Function            PT Goals (current goals can now be found in the care plan section) Acute Rehab PT Goals Patient Stated Goal: "to go home" PT Goal Formulation: With patient/family Time For Goal Achievement: 09/22/19 Potential to Achieve Goals: Good Progress towards PT goals: Progressing toward goals    Frequency    Min 3X/week      PT Plan Current plan remains appropriate    Co-evaluation              AM-PAC PT "6 Clicks" Mobility   Outcome Measure  Help needed turning from your back to your side while in a  flat bed without using bedrails?: None Help needed moving from lying on your back to sitting on the side of a flat bed without using bedrails?: None Help needed moving to and from a bed to a chair (including a wheelchair)?: A Little Help needed standing up from a chair using your arms (e.g., wheelchair or bedside chair)?: None Help needed to walk in hospital room?: A Little Help needed climbing 3-5 steps with a railing? : A Little 6 Click Score: 21    End of Session   Activity Tolerance: Patient tolerated treatment well Patient left: in chair;with call bell/phone within reach Nurse Communication: Mobility status PT Visit Diagnosis: Other abnormalities of gait and mobility (R26.89)     Time: 9675-9163 PT Time Calculation (min) (ACUTE ONLY): 23 min  Charges:  $Gait Training: 8-22 mins $Therapeutic Activity: 8-22 mins                     Van Clines, PT  Acute  Rehabilitation Services Pager [phone] Office [phone]    Levi Aland 09/14/2019, 2:22 PM

## 2019-09-15 ENCOUNTER — Other Ambulatory Visit: Payer: Self-pay | Admitting: Radiology

## 2019-09-15 ENCOUNTER — Other Ambulatory Visit: Payer: Self-pay | Admitting: Surgery

## 2019-09-15 DIAGNOSIS — T8143XA Infection following a procedure, organ and space surgical site, initial encounter: Secondary | ICD-10-CM

## 2019-09-16 LAB — AEROBIC/ANAEROBIC CULTURE W GRAM STAIN (SURGICAL/DEEP WOUND)
Culture: NO GROWTH
Special Requests: NORMAL

## 2019-09-29 ENCOUNTER — Ambulatory Visit
Admission: RE | Admit: 2019-09-29 | Discharge: 2019-09-29 | Disposition: A | Discharge status: Home / Self Care | Payer: Medicare Other | Source: Ambulatory Visit | Attending: Radiology | Admitting: Radiology

## 2019-09-29 ENCOUNTER — Ambulatory Visit
Admission: RE | Admit: 2019-09-29 | Discharge: 2019-09-29 | Disposition: A | Discharge status: Home / Self Care | Payer: Medicare Other | Source: Ambulatory Visit | Attending: Surgery | Admitting: Surgery

## 2019-09-29 DIAGNOSIS — T8143XA Infection following a procedure, organ and space surgical site, initial encounter: Secondary | ICD-10-CM

## 2019-10-27 ENCOUNTER — Other Ambulatory Visit: Payer: Self-pay

## 2019-10-27 ENCOUNTER — Emergency Department (HOSPITAL_COMMUNITY): Payer: Medicare Other

## 2019-10-27 ENCOUNTER — Encounter (HOSPITAL_COMMUNITY): Payer: Self-pay | Admitting: Internal Medicine

## 2019-10-27 ENCOUNTER — Inpatient Hospital Stay (HOSPITAL_COMMUNITY)
Admission: EM | Admit: 2019-10-27 | Discharge: 2019-10-31 | DRG: 392 | Disposition: A | Discharge status: Home / Self Care | Payer: Medicare Other | Attending: Internal Medicine | Admitting: Internal Medicine

## 2019-10-27 DIAGNOSIS — L89152 Pressure ulcer of sacral region, stage 2: Secondary | ICD-10-CM | POA: Diagnosis present

## 2019-10-27 DIAGNOSIS — E876 Hypokalemia: Secondary | ICD-10-CM | POA: Diagnosis present

## 2019-10-27 DIAGNOSIS — Z880 Allergy status to penicillin: Secondary | ICD-10-CM

## 2019-10-27 DIAGNOSIS — E785 Hyperlipidemia, unspecified: Secondary | ICD-10-CM | POA: Diagnosis not present

## 2019-10-27 DIAGNOSIS — E440 Moderate protein-calorie malnutrition: Secondary | ICD-10-CM | POA: Diagnosis not present

## 2019-10-27 DIAGNOSIS — D649 Anemia, unspecified: Secondary | ICD-10-CM | POA: Diagnosis not present

## 2019-10-27 DIAGNOSIS — R55 Syncope and collapse: Secondary | ICD-10-CM | POA: Diagnosis present

## 2019-10-27 DIAGNOSIS — Z9049 Acquired absence of other specified parts of digestive tract: Secondary | ICD-10-CM

## 2019-10-27 DIAGNOSIS — I10 Essential (primary) hypertension: Secondary | ICD-10-CM | POA: Diagnosis not present

## 2019-10-27 DIAGNOSIS — I959 Hypotension, unspecified: Secondary | ICD-10-CM | POA: Diagnosis not present

## 2019-10-27 DIAGNOSIS — R109 Unspecified abdominal pain: Secondary | ICD-10-CM

## 2019-10-27 DIAGNOSIS — N179 Acute kidney failure, unspecified: Secondary | ICD-10-CM | POA: Diagnosis present

## 2019-10-27 DIAGNOSIS — K529 Noninfective gastroenteritis and colitis, unspecified: Secondary | ICD-10-CM | POA: Diagnosis present

## 2019-10-27 DIAGNOSIS — A09 Infectious gastroenteritis and colitis, unspecified: Secondary | ICD-10-CM | POA: Diagnosis not present

## 2019-10-27 DIAGNOSIS — R1013 Epigastric pain: Secondary | ICD-10-CM

## 2019-10-27 DIAGNOSIS — G8929 Other chronic pain: Secondary | ICD-10-CM | POA: Diagnosis present

## 2019-10-27 DIAGNOSIS — E860 Dehydration: Secondary | ICD-10-CM | POA: Diagnosis present

## 2019-10-27 DIAGNOSIS — R9431 Abnormal electrocardiogram [ECG] [EKG]: Secondary | ICD-10-CM | POA: Diagnosis not present

## 2019-10-27 DIAGNOSIS — L8990 Pressure ulcer of unspecified site, unspecified stage: Secondary | ICD-10-CM | POA: Insufficient documentation

## 2019-10-27 DIAGNOSIS — Z20822 Contact with and (suspected) exposure to covid-19: Secondary | ICD-10-CM | POA: Diagnosis present

## 2019-10-27 HISTORY — DX: Hyperlipidemia, unspecified: E78.5

## 2019-10-27 HISTORY — DX: Syncope and collapse: R55

## 2019-10-27 LAB — URINALYSIS, ROUTINE W REFLEX MICROSCOPIC
Bacteria, UA: NONE SEEN
Bilirubin Urine: NEGATIVE
Glucose, UA: NEGATIVE mg/dL
Hgb urine dipstick: NEGATIVE
Ketones, ur: NEGATIVE mg/dL
Leukocytes,Ua: NEGATIVE
Nitrite: NEGATIVE
Protein, ur: NEGATIVE mg/dL
Specific Gravity, Urine: 1.003 — ABNORMAL LOW (ref 1.005–1.030)
pH: 7 (ref 5.0–8.0)

## 2019-10-27 LAB — CBC WITH DIFFERENTIAL/PLATELET
Abs Immature Granulocytes: 0.03 10*3/uL (ref 0.00–0.07)
Basophils Absolute: 0 10*3/uL (ref 0.0–0.1)
Basophils Relative: 1 %
Eosinophils Absolute: 0 10*3/uL (ref 0.0–0.5)
Eosinophils Relative: 1 %
HCT: 35.5 % — ABNORMAL LOW (ref 36.0–46.0)
Hemoglobin: 11.3 g/dL — ABNORMAL LOW (ref 12.0–15.0)
Immature Granulocytes: 1 %
Lymphocytes Relative: 15 %
Lymphs Abs: 0.9 10*3/uL (ref 0.7–4.0)
MCH: 31.7 pg (ref 26.0–34.0)
MCHC: 31.8 g/dL (ref 30.0–36.0)
MCV: 99.4 fL (ref 80.0–100.0)
Monocytes Absolute: 0.3 10*3/uL (ref 0.1–1.0)
Monocytes Relative: 6 %
Neutro Abs: 4.6 10*3/uL (ref 1.7–7.7)
Neutrophils Relative %: 76 %
Platelets: 284 10*3/uL (ref 150–400)
RBC: 3.57 MIL/uL — ABNORMAL LOW (ref 3.87–5.11)
RDW: 14.6 % (ref 11.5–15.5)
WBC: 5.8 10*3/uL (ref 4.0–10.5)
nRBC: 0 % (ref 0.0–0.2)

## 2019-10-27 LAB — CBG MONITORING, ED
Glucose-Capillary: 111 mg/dL — ABNORMAL HIGH (ref 70–99)
Glucose-Capillary: 130 mg/dL — ABNORMAL HIGH (ref 70–99)

## 2019-10-27 LAB — COMPREHENSIVE METABOLIC PANEL
ALT: 11 U/L (ref 0–44)
AST: 25 U/L (ref 15–41)
Albumin: 2.8 g/dL — ABNORMAL LOW (ref 3.5–5.0)
Alkaline Phosphatase: 73 U/L (ref 38–126)
Anion gap: 14 (ref 5–15)
BUN: 10 mg/dL (ref 8–23)
CO2: 28 mmol/L (ref 22–32)
Calcium: 9.2 mg/dL (ref 8.9–10.3)
Chloride: 97 mmol/L — ABNORMAL LOW (ref 98–111)
Creatinine, Ser: 1.34 mg/dL — ABNORMAL HIGH (ref 0.44–1.00)
GFR calc Af Amer: 44 mL/min — ABNORMAL LOW (ref >60)
GFR calc non Af Amer: 38 mL/min — ABNORMAL LOW (ref >60)
Glucose, Bld: 155 mg/dL — ABNORMAL HIGH (ref 70–99)
Potassium: 2.2 mmol/L — CL (ref 3.5–5.1)
Sodium: 139 mmol/L (ref 135–145)
Total Bilirubin: 0.6 mg/dL (ref 0.3–1.2)
Total Protein: 6.2 g/dL — ABNORMAL LOW (ref 6.5–8.1)

## 2019-10-27 LAB — LACTIC ACID, PLASMA
Lactic Acid, Venous: 3.4 mmol/L (ref 0.5–1.9)
Lactic Acid, Venous: 2.1 mmol/L (ref 0.5–1.9)

## 2019-10-27 LAB — LIPASE, BLOOD: Lipase: 23 U/L (ref 11–51)

## 2019-10-27 LAB — TROPONIN I (HIGH SENSITIVITY)
Troponin I (High Sensitivity): 22 ng/L — ABNORMAL HIGH (ref <18)
Troponin I (High Sensitivity): 38 ng/L — ABNORMAL HIGH (ref <18)
Troponin I (High Sensitivity): 33 ng/L — ABNORMAL HIGH (ref <18)

## 2019-10-27 LAB — MAGNESIUM: Magnesium: 2.1 mg/dL (ref 1.7–2.4)

## 2019-10-27 LAB — SARS CORONAVIRUS 2 (TAT 6-24 HRS): SARS Coronavirus 2: NEGATIVE

## 2019-10-27 MED ORDER — POTASSIUM CHLORIDE IN NACL 20-0.9 MEQ/L-% IV SOLN
INTRAVENOUS | Status: DC
  Filled 2019-10-27: qty 1000

## 2019-10-27 MED ORDER — ENOXAPARIN SODIUM 40 MG/0.4ML ~~LOC~~ SOLN
40.0000 mg | Freq: Every day | SUBCUTANEOUS | Status: DC
  Administered 2019-10-28 – 2019-10-31 (×4): 40 mg via SUBCUTANEOUS
  Filled 2019-10-27 (×4): qty 0.4

## 2019-10-27 MED ORDER — SODIUM CHLORIDE 0.9 % IV BOLUS
1000.0000 mL | Freq: Once | INTRAVENOUS | Status: AC
  Administered 2019-10-27: 12:00:00 1000 mL via INTRAVENOUS

## 2019-10-27 MED ORDER — ROSUVASTATIN CALCIUM 20 MG PO TABS
20.0000 mg | ORAL_TABLET | Freq: Every day | ORAL | Status: DC
  Administered 2019-10-28 – 2019-10-31 (×4): 20 mg via ORAL
  Filled 2019-10-27 (×4): qty 1

## 2019-10-27 MED ORDER — SODIUM CHLORIDE 0.9 % IV BOLUS
1000.0000 mL | Freq: Once | INTRAVENOUS | Status: AC
  Administered 2019-10-27: 1000 mL via INTRAVENOUS

## 2019-10-27 MED ORDER — IOHEXOL 300 MG/ML  SOLN
80.0000 mL | Freq: Once | INTRAMUSCULAR | Status: AC | PRN
  Administered 2019-10-27: 80 mL via INTRAVENOUS

## 2019-10-27 MED ORDER — OXYCODONE HCL 5 MG PO TABS
5.0000 mg | ORAL_TABLET | Freq: Four times a day (QID) | ORAL | Status: DC | PRN
  Administered 2019-10-28 – 2019-10-30 (×8): 10 mg via ORAL
  Filled 2019-10-27 (×8): qty 2

## 2019-10-27 MED ORDER — ONDANSETRON HCL 4 MG PO TABS: 4.0000 mg | ORAL_TABLET | Freq: Four times a day (QID) | ORAL | Status: DC | PRN

## 2019-10-27 MED ORDER — ONDANSETRON HCL 4 MG/2ML IJ SOLN: 4.0000 mg | Freq: Four times a day (QID) | INTRAMUSCULAR | Status: DC | PRN

## 2019-10-27 MED ORDER — MAGNESIUM SULFATE 2 GM/50ML IV SOLN
2.0000 g | Freq: Once | INTRAVENOUS | Status: AC
  Administered 2019-10-27: 2 g via INTRAVENOUS
  Filled 2019-10-27: qty 50

## 2019-10-27 MED ORDER — ACETAMINOPHEN 325 MG PO TABS
650.0000 mg | ORAL_TABLET | Freq: Four times a day (QID) | ORAL | Status: DC | PRN
  Administered 2019-10-28 – 2019-10-31 (×7): 650 mg via ORAL
  Filled 2019-10-27 (×7): qty 2

## 2019-10-27 MED ORDER — ACETAMINOPHEN 650 MG RE SUPP: 650.0000 mg | Freq: Four times a day (QID) | RECTAL | Status: DC | PRN

## 2019-10-27 MED ORDER — POTASSIUM CHLORIDE 10 MEQ/100ML IV SOLN
10.0000 meq | INTRAVENOUS | Status: AC
  Administered 2019-10-27 (×5): 10 meq via INTRAVENOUS
  Filled 2019-10-27 (×6): qty 100

## 2019-10-27 NOTE — H&P (Addendum)
History and Physical    Samantha Gonzales RFX:[id number] DOB: Jun 27, 1941 DOA: 10/27/2019  PCP: System, Pcp Not In  Patient coming from: Home.  History obtained from patient's daughter.  Chief Complaint: Loss of consciousness.  HPI: Samantha Gonzales is a 78 y.o. female with history of hypertension, hyperlipidemia who was admitted 2 months ago for cholecystectomy at that time patient also underwent ERCP with stent placement following which patient also had some fluid collection in the gallbladder fossa for which patient had drain placed initially was discharged home.  Patient's family states that over the last 2 months even prior to the surgery patient has been having poor appetite with increasing abdominal pain.  No vomiting or diarrhea.  Over the last 24 hours patient worsened and patient also had a brief syncopal episode.  Last for few seconds.  Patient was brought to the ER by the family.  ED Course: In the ER patient was noticed to have a systolic blood pressure of 60 and soon improved with IV fluids.  EKG shows sinus rhythm with prolonged QTC of 603 ms.  Given the abdominal pain CT abdomen was done which shows normal gallbladder area but does show right-sided colitis favoring infection.  Labs show worsening renal function from normal in November to 1.3 now with a potassium of 2.2.  Hemoglobin was 11.3 which is baseline.  Covid test was negative lactic acid was 3.4.  Patient was given fluid bolus potassium replacement admitted for further management of acute renal failure with syncope abdominal pain with colitis.  Review of Systems: As per HPI, rest all negative.   Past Medical History:  Diagnosis Date  . Hypertension     Past Surgical History:  Procedure Laterality Date  . CHOLECYSTECTOMY  09/05/2019  . CHOLECYSTECTOMY N/A 09/05/2019   Procedure: LAPAROSCOPIC CHOLECYSTECTOMY WITH INTRAOPERATIVE CHOLANGIOGRAM;  Surgeon: Harriette Bouillon, MD;  Location: [facility] OR;  Service: General;   Laterality: N/A;  . ERCP N/A 09/07/2019   Procedure: ENDOSCOPIC RETROGRADE CHOLANGIOPANCREATOGRAPHY (ERCP);  Surgeon: Vida Rigger, MD;  Location: [facility] ENDOSCOPY;  Service: Endoscopy;  Laterality: N/A;  . PANCREATIC STENT PLACEMENT  09/07/2019   Procedure: PANCREATIC STENT PLACEMENT;  Surgeon: Vida Rigger, MD;  Location: [facility] ENDOSCOPY;  Service: Endoscopy;;  . REMOVAL OF STONES  09/07/2019   Procedure: REMOVAL OF STONES;  Surgeon: Vida Rigger, MD;  Location: [facility] ENDOSCOPY;  Service: Endoscopy;;  . Dennison Mascot  09/07/2019   Procedure: Dennison Mascot;  Surgeon: Vida Rigger, MD;  Location: [facility] ENDOSCOPY;  Service: Endoscopy;;  . TUBAL LIGATION       reports that she has never smoked. Her smokeless tobacco use includes chew. She reports that she does not drink alcohol or use drugs.  Allergies  Allergen Reactions  . Penicillins Hives, Itching and Rash    Has patient had a PCN reaction causing immediate rash, facial/tongue/throat swelling, SOB or lightheadedness with hypotension: Yes Has patient had a PCN reaction causing severe rash involving mucus membranes or skin necrosis: yes Has patient had a PCN reaction that required hospitalization: unk Has patient had a PCN reaction occurring within the last 10 years: unk If all of the above answers are "NO", then may proceed with Cephalosporin use.     Family History  Problem Relation Age of Onset  . CAD Neg Hx     Prior to Admission medications   Medication Sig Start Date End Date Taking? Authorizing Provider  acetaminophen (TYLENOL) 325 MG tablet Take 650 mg by mouth every 6 (six) hours as needed  for mild pain or moderate pain.    [provider]  amLODipine (NORVASC) 5 MG tablet Take 1 tablet (5 mg total) by mouth daily. Patient taking differently: Take 10 mg by mouth daily.  01/21/18   Geradine Girt, DO  Cyanocobalamin (B-12) 1000 MCG SUBL Place 1,000 mcg under the tongue daily. Patient not taking: Reported on 09/04/2019 01/20/18    Geradine Girt, DO  docusate sodium (COLACE) 100 MG capsule Take 1 capsule (100 mg total) by mouth daily as needed for mild constipation. 09/14/19   Rayburn, Floyce Stakes, PA-C  lactulose (CHRONULAC) 10 GM/15ML solution Take 30 mLs (20 g total) by mouth 2 (two) times daily as needed for mild constipation. For daily bowel movements 01/20/18   Eulogio Bear U, DO  oxyCODONE (OXY IR/ROXICODONE) 5 MG immediate release tablet Take 1-2 tablets (5-10 mg total) by mouth every 6 (six) hours as needed for severe pain. 09/14/19   Rayburn, Floyce Stakes, PA-C  pantoprazole (PROTONIX) 40 MG tablet Take 1 tablet (40 mg total) by mouth 2 (two) times daily. Patient not taking: Reported on 09/04/2019 01/20/18   Geradine Girt, DO  rosuvastatin (CRESTOR) 20 MG tablet Take 20 mg by mouth daily. 07/30/18   [provider]  thiamine 100 MG tablet Take 1 tablet (100 mg total) by mouth daily. Patient not taking: Reported on 09/04/2019 01/21/18   Geradine Girt, DO  tiZANidine (ZANAFLEX) 4 MG tablet Take 1 tablet (4 mg total) by mouth every 8 (eight) hours as needed for muscle spasms. 09/14/19   Rayburn, Floyce Stakes, PA-C    Physical Exam: Constitutional: Moderately built and nourished. Vitals:   10/27/19 2000 10/27/19 2030 10/27/19 2100 10/27/19 2143  BP: (!) 156/60 (!) 131/52 (!) 136/57 (!) 137/55  Pulse:    70  Resp: 13 16 17 18   Temp: 98 F (36.7 C)   98.2 F (36.8 C)  TempSrc:    Oral  SpO2:   100% 100%  Weight:       Eyes: Anicteric no pallor. ENMT: No discharge from the ears eyes nose or mouth. Neck: No mass felt.  No neck rigidity. Respiratory: No rhonchi or crepitations. Cardiovascular: S1-S2 heard. Abdomen: Mild epigastric and right upper and lower quadrant tenderness.  No guarding rigidity no rebound tenderness. Musculoskeletal: No edema. Skin: No rash. Neurologic: Alert awake oriented time place and person.  Moves all extremities. Psychiatric: Appears normal per normal affect.   Labs on Admission: I  have personally reviewed following labs and imaging studies  CBC: Recent Labs  Lab 10/27/19 1215  WBC 5.8  NEUTROABS 4.6  HGB 11.3*  HCT 35.5*  MCV 99.4  PLT 376   Basic Metabolic Panel: Recent Labs  Lab 10/27/19 1215 10/27/19 1532  NA 139  --   K 2.2*  --   CL 97*  --   CO2 28  --   GLUCOSE 155*  --   BUN 10  --   CREATININE 1.34*  --   CALCIUM 9.2  --   MG  --  2.1   GFR: Estimated Creatinine Clearance: 30.2 mL/min (A) (by C-G formula based on SCr of 1.34 mg/dL (H)). Liver Function Tests: Recent Labs  Lab 10/27/19 1215  AST 25  ALT 11  ALKPHOS 73  BILITOT 0.6  PROT 6.2*  ALBUMIN 2.8*   Recent Labs  Lab 10/27/19 1215  LIPASE 23   No results for input(s): AMMONIA in the last 168 hours. Coagulation Profile: No results for  input(s): INR, PROTIME in the last 168 hours. Cardiac Enzymes: No results for input(s): CKTOTAL, CKMB, CKMBINDEX, TROPONINI in the last 168 hours. BNP (last 3 results) No results for input(s): PROBNP in the last 8760 hours. HbA1C: No results for input(s): HGBA1C in the last 72 hours. CBG: Recent Labs  Lab 10/27/19 1140 10/27/19 1148  GLUCAP 130* 111*   Lipid Profile: No results for input(s): CHOL, HDL, LDLCALC, TRIG, CHOLHDL, LDLDIRECT in the last 72 hours. Thyroid Function Tests: No results for input(s): TSH, T4TOTAL, FREET4, T3FREE, THYROIDAB in the last 72 hours. Anemia Panel: No results for input(s): VITAMINB12, FOLATE, FERRITIN, TIBC, IRON, RETICCTPCT in the last 72 hours. Urine analysis:    Component Value Date/Time   COLORURINE COLORLESS (A) 10/27/2019 1500   APPEARANCEUR CLEAR 10/27/2019 1500   LABSPEC 1.003 (L) 10/27/2019 1500   PHURINE 7.0 10/27/2019 1500   GLUCOSEU NEGATIVE 10/27/2019 1500   HGBUR NEGATIVE 10/27/2019 1500   BILIRUBINUR NEGATIVE 10/27/2019 1500   KETONESUR NEGATIVE 10/27/2019 1500   PROTEINUR NEGATIVE 10/27/2019 1500   NITRITE NEGATIVE 10/27/2019 1500   LEUKOCYTESUR NEGATIVE 10/27/2019 1500    Sepsis Labs: @LABRCNTIP (procalcitonin:4,lacticidven:4) ) Recent Results (from the past 240 hour(s))  Culture, blood (routine x 2)     Status: None (Preliminary result)   Collection Time: 10/27/19 12:00 PM   Specimen: BLOOD  Result Value Ref Range Status   Specimen Description BLOOD RIGHT ANTECUBITAL  Final   Special Requests   Final    BOTTLES DRAWN AEROBIC AND ANAEROBIC Blood Culture results may not be optimal due to an inadequate volume of blood received in culture bottles   Culture   Final    NO GROWTH <12 HOURS Performed at [facility], [address] [place], [place] [address]    Report Status PENDING  Incomplete  SARS CORONAVIRUS 2 (TAT 6-24 HRS) Nasopharyngeal Nasopharyngeal Swab     Status: None   Collection Time: 10/27/19  7:04 PM   Specimen: Nasopharyngeal Swab  Result Value Ref Range Status   SARS Coronavirus 2 NEGATIVE NEGATIVE Final    Comment: (NOTE) SARS-CoV-2 target nucleic acids are NOT DETECTED. The SARS-CoV-2 RNA is generally detectable in upper and lower respiratory specimens during the acute phase of infection. Negative results do not preclude SARS-CoV-2 infection, do not rule out co-infections with other pathogens, and should not be used as the sole basis for treatment or other patient management decisions. Negative results must be combined with clinical observations, patient history, and epidemiological information. The expected result is Negative. Fact Sheet for Patients: HairSlick.nohttps://www.fda.gov/media/138098/download Fact Sheet for Healthcare Providers: quierodirigir.comhttps://www.fda.gov/media/138095/download This test is not yet approved or cleared by the [place] FDA and  has been authorized for detection and/or diagnosis of SARS-CoV-2 by FDA under an Emergency Use Authorization (EUA). This EUA will remain  in effect (meaning this test can be used) for the duration of the COVID-19 declaration under Section 56 4(b)(1) of the Act, 21 U.S.C. section  360bbb-3(b)(1), unless the authorization is terminated or revoked sooner. Performed at [facility], [address] [place], [place] [address]      Radiological Exams on Admission: CT ABDOMEN PELVIS W CONTRAST  Result Date: 10/27/2019 CLINICAL DATA:  Abdominal pain. Status post cholecystectomy. Epigastric pain. Syncope. Hypertension. Three weeks postop. Recently had gallbladder surgery. ERCP and sphincterotomy 09/07/2019. Pancreatic stent placement 09/07/2019. Cholecystectomy 09/05/2019. EXAM: CT ABDOMEN AND PELVIS WITH CONTRAST TECHNIQUE: Multidetector CT imaging of the abdomen and pelvis was performed using the standard protocol following bolus  administration of intravenous contrast. CONTRAST:  80mL OMNIPAQUE IOHEXOL 300 MG/ML  SOLN COMPARISON:  09/09/2014 FINDINGS: Lower chest: Clear lung bases. Normal heart size without pericardial or pleural effusion. Right coronary artery atherosclerosis. Hepatobiliary: Central right hepatic lobe 2.6 cm cyst. Other smaller hepatic lesions are too small to characterize, but likely cysts. Cholecystectomy. Resolution or drainage of previously described fluid collection in the operative bed. Pneumobilia which is presumably iatrogenic. Intrahepatic ducts mildly dilated for prior cholecystectomy state, slightly increased. The common duct measures 11 mm on 32/9 coronal. Similar to the prior exam. Pancreas: Normal pancreas for age. No duct dilatation or acute inflammation. Spleen: Normal in size, without focal abnormality. Adrenals/Urinary Tract: Normal adrenal glands. Bilateral renal cysts. No hydronephrosis. Normal urinary bladder. Stomach/Bowel: Normal stomach, without wall thickening. The colon is underdistended. The right-side of the colon is thick walled with mild mucosal hyperenhancement, including on 40/3. Terminal ileum and appendix are difficult to delineate, but felt to be grossly normal. Normal small bowel caliber. Vascular/Lymphatic: Advanced aortic and  branch vessel atherosclerosis. No abdominopelvic adenopathy. Reproductive: Uterus and adnexa not well evaluated. Prominent left greater than right gonadal veins. Other: Resolution or drainage of previously described pelvic cul-de-sac fluid collections. Exam degraded by paucity of intraabdominal/pelvic fat. Musculoskeletal: Left hip osteoarthritis.  Lumbosacral spondylosis. IMPRESSION: 1. Mildly degraded exam secondary to paucity of abdominopelvic fat. 2. Right-sided colitis.  Distribution favors infection. 3. Cholecystectomy with resolution of fluid collections in the operative bed. Mild biliary duct dilatation is chronic. Consider correlation with bilirubin level. 4. 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 5. Coronary artery atherosclerosis. Aortic Atherosclerosis (ICD10-I70.0). Electronically Signed   By: Jeronimo Greaves M.D.   On: 10/27/2019 18:35    EKG: Independently reviewed.  Sinus rhythm with prolonged QTC.  Assessment/Plan Principal Problem:   Syncope Active Problems:   AKI (acute kidney injury) (HCC)   Hypokalemia   Essential hypertension    1. Abdominal pain with colitis -differentials include infectious versus ischemic.  We will continue with hydration and will keep patient on Cipro and Flagyl.  Patient still complains of abdominal pain for which patient is placed on pain really medications.  Follow lactic acid levels.  Consult [facility] in the morning. 2. Syncope likely related to hypotension patient's blood pressure was around 60 systolic when patient arrived.  Note that patient also has prolonged QTC in the setting of hypokalemia.  We will hold antihypertensives and continue hydration. 3. Prolonged QTC likely from electrolyte derangements.  Recheck EKG after correcting potassium and hydration.  Follow magnesium levels. 4. Hypertension we will hold antihypertensives due to patient being hypotensive at presentation.  As needed IV hydralazine. 5. Acute renal  failure likely from poor oral intake and dehydration.  I think will improve with hydration. 6. Protein calorie malnutrition will need further input from nutritionist but patient is oral intake is poor due to constant abdominal pain for which will need GI input. 7. Anemia appears to be chronic.  Follow CBC. 8. History of cholecystectomy followed by ERCP and stent placement with sphincterotomy in November 2020. 9. History of hyperlipidemia.   DVT prophylaxis: Lovenox. Code Status: Full code. Family Communication: Patient's daughter. Disposition Plan: Home. Consults called: None. Admission status: Observation.   Eduard Clos MD [facility] Pager [phone].  If 7PM-7AM, please contact night-coverage [email or web address] Password [email or web address]  10/27/2019, 11:30 PM

## 2019-10-27 NOTE — ED Provider Notes (Signed)
Received patient as a handoff from [employer] law, PA-C.    HPI Per handoff provider: Drema HalonMaxine B Gonzales is a 78 y.o. female with history of hypertension, recent cholecystectomy and pancreatic stent placement who presents following syncopal episode.  Patient was in the car with her son when she went unresponsive.  On arrival, her blood pressure was 80/60.  She has not been eating or drinking much since her surgery.  She has had ongoing epigastric pain.  She denies any chest pain or shortness of breath, nausea, vomiting, diarrhea.     Physical Exam  BP (!) 117/59   Pulse (!) 59   Temp 97.7 F (36.5 C) (Oral)   Resp 17   Wt 53.5 kg   SpO2 100%   BMI 16.00 kg/m   Physical Exam Vitals and nursing note reviewed. Exam conducted with a chaperone present.  Constitutional:      Appearance: Normal appearance.  HENT:     Head: Normocephalic and atraumatic.  Eyes:     General: No scleral icterus.    Conjunctiva/sclera: Conjunctivae normal.  Cardiovascular:     Rate and Rhythm: Normal rate and regular rhythm.  Pulmonary:     Effort: Pulmonary effort is normal. No respiratory distress.     Breath sounds: Normal breath sounds.  Musculoskeletal:     Cervical back: Normal range of motion. No rigidity.  Skin:    General: Skin is dry.  Neurological:     Mental Status: She is alert.     GCS: GCS eye subscore is 4. GCS verbal subscore is 5. GCS motor subscore is 6.  Psychiatric:        Mood and Affect: Mood normal.        Behavior: Behavior normal.        Thought Content: Thought content normal.      ED Course/Procedures     Procedures Results for orders placed or performed during the hospital encounter of 10/27/19  Culture, blood (routine x 2)   Specimen: BLOOD  Result Value Ref Range   Specimen Description BLOOD RIGHT ANTECUBITAL    Special Requests      BOTTLES DRAWN AEROBIC AND ANAEROBIC Blood Culture results may not be optimal due to an inadequate volume of blood received in  culture bottles   Culture      NO GROWTH <12 HOURS Performed at [facility], [address] [place], [place] [address]    Report Status PENDING   Comprehensive metabolic panel  Result Value Ref Range   Sodium 139 135 - 145 mmol/L   Potassium 2.2 (LL) 3.5 - 5.1 mmol/L   Chloride 97 (L) 98 - 111 mmol/L   CO2 28 22 - 32 mmol/L   Glucose, Bld 155 (H) 70 - 99 mg/dL   BUN 10 8 - 23 mg/dL   Creatinine, Ser 6.041.34 (H) 0.44 - 1.00 mg/dL   Calcium 9.2 8.9 - 54.010.3 mg/dL   Total Protein 6.2 (L) 6.5 - 8.1 g/dL   Albumin 2.8 (L) 3.5 - 5.0 g/dL   AST 25 15 - 41 U/L   ALT 11 0 - 44 U/L   Alkaline Phosphatase 73 38 - 126 U/L   Total Bilirubin 0.6 0.3 - 1.2 mg/dL   GFR calc non Af Amer 38 (L) >60 mL/min   GFR calc Af Amer 44 (L) >60 mL/min   Anion gap 14 5 - 15  Lipase, blood  Result Value Ref Range   Lipase 23 11 - 51 U/L  CBC with Differential  Result Value Ref Range   WBC 5.8 4.0 - 10.5 K/uL   RBC 3.57 (L) 3.87 - 5.11 MIL/uL   Hemoglobin 11.3 (L) 12.0 - 15.0 g/dL   HCT 35.5 (L) 36.0 - 46.0 %   MCV 99.4 80.0 - 100.0 fL   MCH 31.7 26.0 - 34.0 pg   MCHC 31.8 30.0 - 36.0 g/dL   RDW 14.6 11.5 - 15.5 %   Platelets 284 150 - 400 K/uL   nRBC 0.0 0.0 - 0.2 %   Neutrophils Relative % 76 %   Neutro Abs 4.6 1.7 - 7.7 K/uL   Lymphocytes Relative 15 %   Lymphs Abs 0.9 0.7 - 4.0 K/uL   Monocytes Relative 6 %   Monocytes Absolute 0.3 0.1 - 1.0 K/uL   Eosinophils Relative 1 %   Eosinophils Absolute 0.0 0.0 - 0.5 K/uL   Basophils Relative 1 %   Basophils Absolute 0.0 0.0 - 0.1 K/uL   Immature Granulocytes 1 %   Abs Immature Granulocytes 0.03 0.00 - 0.07 K/uL  Lactic acid, plasma  Result Value Ref Range   Lactic Acid, Venous 3.4 (HH) 0.5 - 1.9 mmol/L  Lactic acid, plasma  Result Value Ref Range   Lactic Acid, Venous 2.1 (HH) 0.5 - 1.9 mmol/L  Urinalysis, Routine w reflex microscopic  Result Value Ref Range   Color, Urine COLORLESS (A) YELLOW   APPearance CLEAR CLEAR   Specific  Gravity, Urine 1.003 (L) 1.005 - 1.030   pH 7.0 5.0 - 8.0   Glucose, UA NEGATIVE NEGATIVE mg/dL   Hgb urine dipstick NEGATIVE NEGATIVE   Bilirubin Urine NEGATIVE NEGATIVE   Ketones, ur NEGATIVE NEGATIVE mg/dL   Protein, ur NEGATIVE NEGATIVE mg/dL   Nitrite NEGATIVE NEGATIVE   Leukocytes,Ua NEGATIVE NEGATIVE   RBC / HPF 0-5 0 - 5 RBC/hpf   WBC, UA 0-5 0 - 5 WBC/hpf   Bacteria, UA NONE SEEN NONE SEEN   Squamous Epithelial / LPF 0-5 0 - 5   Mucus PRESENT   Magnesium  Result Value Ref Range   Magnesium 2.1 1.7 - 2.4 mg/dL  CBG monitoring, ED  Result Value Ref Range   Glucose-Capillary 130 (H) 70 - 99 mg/dL   Comment 1 Notify RN    Comment 2 Document in Chart   CBG monitoring, ED  Result Value Ref Range   Glucose-Capillary 111 (H) 70 - 99 mg/dL  Troponin I (High Sensitivity)  Result Value Ref Range   Troponin I (High Sensitivity) 22 (H) <18 ng/L  Troponin I (High Sensitivity)  Result Value Ref Range   Troponin I (High Sensitivity) 38 (H) <18 ng/L  Troponin I (High Sensitivity)  Result Value Ref Range   Troponin I (High Sensitivity) 33 (H) <18 ng/L   CT ABDOMEN PELVIS W CONTRAST  Result Date: 10/27/2019 CLINICAL DATA:  Abdominal pain. Status post cholecystectomy. Epigastric pain. Syncope. Hypertension. Three weeks postop. Recently had gallbladder surgery. ERCP and sphincterotomy 09/07/2019. Pancreatic stent placement 09/07/2019. Cholecystectomy 09/05/2019. EXAM: CT ABDOMEN AND PELVIS WITH CONTRAST TECHNIQUE: Multidetector CT imaging of the abdomen and pelvis was performed using the standard protocol following bolus administration of intravenous contrast. CONTRAST:  64mL OMNIPAQUE IOHEXOL 300 MG/ML  SOLN COMPARISON:  09/09/2014 FINDINGS: Lower chest: Clear lung bases. Normal heart size without pericardial or pleural effusion. Right coronary artery atherosclerosis. Hepatobiliary: Central right hepatic lobe 2.6 cm cyst. Other smaller hepatic lesions are too small to characterize, but  likely cysts. Cholecystectomy. Resolution or drainage of  previously described fluid collection in the operative bed. Pneumobilia which is presumably iatrogenic. Intrahepatic ducts mildly dilated for prior cholecystectomy state, slightly increased. The common duct measures 11 mm on 32/9 coronal. Similar to the prior exam. Pancreas: Normal pancreas for age. No duct dilatation or acute inflammation. Spleen: Normal in size, without focal abnormality. Adrenals/Urinary Tract: Normal adrenal glands. Bilateral renal cysts. No hydronephrosis. Normal urinary bladder. Stomach/Bowel: Normal stomach, without wall thickening. The colon is underdistended. The right-side of the colon is thick walled with mild mucosal hyperenhancement, including on 40/3. Terminal ileum and appendix are difficult to delineate, but felt to be grossly normal. Normal small bowel caliber. Vascular/Lymphatic: Advanced aortic and branch vessel atherosclerosis. No abdominopelvic adenopathy. Reproductive: Uterus and adnexa not well evaluated. Prominent left greater than right gonadal veins. Other: Resolution or drainage of previously described pelvic cul-de-sac fluid collections. Exam degraded by paucity of intraabdominal/pelvic fat. Musculoskeletal: Left hip osteoarthritis.  Lumbosacral spondylosis. IMPRESSION: 1. Mildly degraded exam secondary to paucity of abdominopelvic fat. 2. Right-sided colitis.  Distribution favors infection. 3. Cholecystectomy with resolution of fluid collections in the operative bed. Mild biliary duct dilatation is chronic. Consider correlation with bilirubin level. 4. 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 5. Coronary artery atherosclerosis. Aortic Atherosclerosis (ICD10-I70.0). Electronically Signed   By: Jeronimo Greaves M.D.   On: 10/27/2019 18:35      MDM   Plan for end of providers that patient admitted for her profound electrolyte derangement after being found to be hypokalemic to 2.2.   She also has a new prolonged QT on EKG.  Mild AKI with creatinine elevated 1.35.  Lactic acid elevated at 3.4, trended down to 2.1.  Likely attributable to her clinical dehydration.  Fluids provided in interim which appear to have helped.  Low suspicion for sepsis given afebrile with normal WBC.  Troponin trended up from 22-38.  CT abdomen and pelvis is pending at time of handoff to rule out postoperative complication from ERCP performed 09/07/2019.    CT abdomen and pelvis is reviewed and demonstrates a right-sided colitis concerning for infection.  Patient tells me that she has not felt sick, but admits that she has not been eating or drinking since her ERCP procedure in November due to diminished appetite.  Patient has received 2 L NS, potassium chloride IV, and 2 g magnesium sulfate IV.  COVID-19 testing obtained and is in process.  Will consult hospitalist for admission due to electrolyte derangement and mild AKI.   Patient no longer wants to be in the trauma bay, but is otherwise understanding and agreeable to admission to the hospital. Discussed the importance of an admission and she voices understanding.   Spoke with Dr. Toniann Fail who will see patient assuming she does not want to leave AMA and admit to medicine.        Lorelee New, PA-C 10/31/19 [id number]    Gwyneth Sprout, MD 11/01/19 [phone]

## 2019-10-27 NOTE — ED Notes (Signed)
ED PA aware of pt K+ results

## 2019-10-27 NOTE — ED Provider Notes (Addendum)
[facility] EMERGENCY DEPARTMENT Provider Note   CSN: [id number] Arrival date & time: 10/27/19  1135     History No chief complaint on file.   Samantha Gonzales is a 78 y.o. female with history of hypertension, recent cholecystectomy and pancreatic stent placement who presents following syncopal episode.  Patient was in the car with her son when she went unresponsive.  On arrival, her blood pressure was 80/60.  She has not been eating or drinking much since her surgery.  She has had ongoing epigastric pain.  She denies any chest pain or shortness of breath, nausea, vomiting, diarrhea.    HPI     Past Medical History:  Diagnosis Date  . Hypertension     Patient Active Problem List   Diagnosis Date Noted  . Malnutrition of moderate degree 09/07/2019  . Cholecystitis 09/04/2019  . Altered mental status 01/18/2018  . Altered mental state 01/17/2018    Past Surgical History:  Procedure Laterality Date  . CHOLECYSTECTOMY  09/05/2019  . CHOLECYSTECTOMY N/A 09/05/2019   Procedure: LAPAROSCOPIC CHOLECYSTECTOMY WITH INTRAOPERATIVE CHOLANGIOGRAM;  Surgeon: Erroll Luna, MD;  Location: [facility];  Service: General;  Laterality: N/A;  . ERCP N/A 09/07/2019   Procedure: ENDOSCOPIC RETROGRADE CHOLANGIOPANCREATOGRAPHY (ERCP);  Surgeon: Clarene Essex, MD;  Location: [facility];  Service: Endoscopy;  Laterality: N/A;  . PANCREATIC STENT PLACEMENT  09/07/2019   Procedure: PANCREATIC STENT PLACEMENT;  Surgeon: Clarene Essex, MD;  Location: [facility];  Service: Endoscopy;;  . REMOVAL OF STONES  09/07/2019   Procedure: REMOVAL OF STONES;  Surgeon: Clarene Essex, MD;  Location: [facility] ENDOSCOPY;  Service: Endoscopy;;  . [name]  09/07/2019   Procedure: [name];  Surgeon: Clarene Essex, MD;  Location: [facility] ENDOSCOPY;  Service: Endoscopy;;  . TUBAL LIGATION       OB History   No obstetric history on file.     No family history on file.  Social History   Tobacco Use    . Smoking status: Never Smoker  . Smokeless tobacco: Current User    Types: Chew  Substance Use Topics  . Alcohol use: No  . Drug use: No    Home Medications Prior to Admission medications   Medication Sig Start Date End Date Taking? Authorizing Provider  acetaminophen (TYLENOL) 325 MG tablet Take 650 mg by mouth every 6 (six) hours as needed for mild pain or moderate pain.    [provider]  amLODipine (NORVASC) 5 MG tablet Take 1 tablet (5 mg total) by mouth daily. Patient taking differently: Take 10 mg by mouth daily.  01/21/18   Geradine Girt, DO  Cyanocobalamin (B-12) 1000 MCG SUBL Place 1,000 mcg under the tongue daily. Patient not taking: Reported on 09/04/2019 01/20/18   Geradine Girt, DO  docusate sodium (COLACE) 100 MG capsule Take 1 capsule (100 mg total) by mouth daily as needed for mild constipation. 09/14/19   Rayburn, Floyce Stakes, PA-C  lactulose (CHRONULAC) 10 GM/15ML solution Take 30 mLs (20 g total) by mouth 2 (two) times daily as needed for mild constipation. For daily bowel movements 01/20/18   Eulogio Bear U, DO  oxyCODONE (OXY IR/ROXICODONE) 5 MG immediate release tablet Take 1-2 tablets (5-10 mg total) by mouth every 6 (six) hours as needed for severe pain. 09/14/19   Rayburn, Floyce Stakes, PA-C  pantoprazole (PROTONIX) 40 MG tablet Take 1 tablet (40 mg total) by mouth 2 (two) times daily. Patient not taking: Reported on 09/04/2019 01/20/18   Eulogio Bear  U, DO  rosuvastatin (CRESTOR) 20 MG tablet Take 20 mg by mouth daily. 07/30/18   [provider]  thiamine 100 MG tablet Take 1 tablet (100 mg total) by mouth daily. Patient not taking: Reported on 09/04/2019 01/21/18   Joseph ArtVann, Jessica U, DO  tiZANidine (ZANAFLEX) 4 MG tablet Take 1 tablet (4 mg total) by mouth every 8 (eight) hours as needed for muscle spasms. 09/14/19   Rayburn, Alphonsus SiasKelly A, PA-C    Allergies    Penicillins  Review of Systems   Review of Systems  Constitutional: Negative for chills and  fever.  HENT: Negative for facial swelling and sore throat.   Respiratory: Negative for shortness of breath.   Cardiovascular: Negative for chest pain.  Gastrointestinal: Positive for abdominal pain. Negative for nausea and vomiting.  Genitourinary: Negative for dysuria.  Musculoskeletal: Negative for back pain.  Skin: Negative for rash and wound.  Neurological: Positive for syncope. Negative for headaches.  Psychiatric/Behavioral: The patient is not nervous/anxious.     Physical Exam Updated Vital Signs BP (!) 126/51   Pulse (!) 59   Temp 97.7 F (36.5 C) (Oral)   Resp 15   Wt 53.5 kg   SpO2 100%   BMI 16.00 kg/m   Physical Exam Vitals and nursing note reviewed.  Constitutional:      General: She is not in acute distress.    Appearance: She is well-developed. She is not diaphoretic.     Comments: Thin  HENT:     Head: Normocephalic and atraumatic.     Mouth/Throat:     Pharynx: No oropharyngeal exudate.  Eyes:     General: No scleral icterus.       Right eye: No discharge.        Left eye: No discharge.     Conjunctiva/sclera: Conjunctivae normal.     Pupils: Pupils are equal, round, and reactive to light.  Neck:     Thyroid: No thyromegaly.  Cardiovascular:     Rate and Rhythm: Normal rate and regular rhythm.     Heart sounds: Normal heart sounds. No murmur. No friction rub. No gallop.   Pulmonary:     Effort: Pulmonary effort is normal. No respiratory distress.     Breath sounds: Normal breath sounds. No stridor. No wheezing or rales.  Abdominal:     General: Bowel sounds are normal. There is no distension.     Palpations: Abdomen is soft.     Tenderness: There is abdominal tenderness in the epigastric area. There is no guarding or rebound.  Musculoskeletal:        General: No swelling.     Cervical back: Normal range of motion and neck supple.     Right lower leg: No edema.     Left lower leg: No edema.  Lymphadenopathy:     Cervical: No cervical  adenopathy.  Skin:    General: Skin is warm and dry.     Coloration: Skin is not pale.     Findings: No rash.  Neurological:     Mental Status: She is alert.     Coordination: Coordination normal.     ED Results / Procedures / Treatments   Labs (all labs ordered are listed, but only abnormal results are displayed) Labs Reviewed  COMPREHENSIVE METABOLIC PANEL - Abnormal; Notable for the following components:      Result Value   Potassium 2.2 (*)    Chloride 97 (*)    Glucose, Bld 155 (*)  Creatinine, Ser 1.34 (*)    Total Protein 6.2 (*)    Albumin 2.8 (*)    GFR calc non Af Amer 38 (*)    GFR calc Af Amer 44 (*)    All other components within normal limits  CBC WITH DIFFERENTIAL/PLATELET - Abnormal; Notable for the following components:   RBC 3.57 (*)    Hemoglobin 11.3 (*)    HCT 35.5 (*)    All other components within normal limits  LACTIC ACID, PLASMA - Abnormal; Notable for the following components:   Lactic Acid, Venous 3.4 (*)    All other components within normal limits  URINALYSIS, ROUTINE W REFLEX MICROSCOPIC - Abnormal; Notable for the following components:   Color, Urine COLORLESS (*)    Specific Gravity, Urine 1.003 (*)    All other components within normal limits  CBG MONITORING, ED - Abnormal; Notable for the following components:   Glucose-Capillary 130 (*)    All other components within normal limits  CBG MONITORING, ED - Abnormal; Notable for the following components:   Glucose-Capillary 111 (*)    All other components within normal limits  TROPONIN I (HIGH SENSITIVITY) - Abnormal; Notable for the following components:   Troponin I (High Sensitivity) 22 (*)    All other components within normal limits  CULTURE, BLOOD (ROUTINE X 2)  CULTURE, BLOOD (ROUTINE X 2)  LIPASE, BLOOD  LACTIC ACID, PLASMA  MAGNESIUM  TROPONIN I (HIGH SENSITIVITY)    EKG EKG Interpretation  Date/Time:  Thursday October 27 2019 12:11:26 EST Ventricular Rate:  61 PR  Interval:    QRS Duration: 83 QT Interval:  598 QTC Calculation: 603 R Axis:   58 Text Interpretation: Sinus rhythm Consider left ventricular hypertrophy Anterior Q waves, possibly due to LVH new Prolonged QT interval Confirmed by Gwyneth Sprout ([id number]) on 10/27/2019 12:21:32 PM   Radiology No results found.  Procedures .Critical Care Performed by: Emi Holes, PA-C Authorized by: Emi Holes, PA-C   Critical care provider statement:    Critical care time (minutes):  45   Critical care was necessary to treat or prevent imminent or life-threatening deterioration of the following conditions:  Dehydration, metabolic crisis and renal failure   Critical care was time spent personally by me on the following activities:  Discussions with consultants, evaluation of patient's response to treatment, examination of patient, ordering and performing treatments and interventions, ordering and review of laboratory studies, ordering and review of radiographic studies, pulse oximetry, re-evaluation of patient's condition, obtaining history from patient or surrogate and review of old charts   I assumed direction of critical care for this patient from another provider in my specialty: no     (including critical care time)  Medications Ordered in ED Medications  potassium chloride 10 mEq in 100 mL IVPB (10 mEq Intravenous New Bag/Given 10/27/19 1514)  magnesium sulfate IVPB 2 g 50 mL (2 g Intravenous New Bag/Given 10/27/19 1514)  sodium chloride 0.9 % bolus 1,000 mL (0 mLs Intravenous Stopped 10/27/19 1225)  sodium chloride 0.9 % bolus 1,000 mL (0 mLs Intravenous Stopped 10/27/19 1514)    ED Course  I have reviewed the triage vital signs and the nursing notes.  Pertinent labs & imaging results that were available during my care of the patient were reviewed by me and considered in my medical decision making (see chart for details).    MDM Rules/Calculators/A&P  Patient presenting following syncopal episode.  She is almost 2 months postop from cholecystectomy (09/05/2019).  She has not been eating or drinking much, per her son.  She had an episode of syncope unresponsiveness prior to arrival.  Her pressures were very low, down to 60/40, but responded well to IV fluids.  Patient given 2 L NS.  Patient found to be profoundly hypokalemic at 2.2.  She has a new prolonged QT in association.  She has a mild to moderate AKI with creatinine up to 1.35. Lactic acid 3.4. UA negative. No leukocytosis or fever. Low suspicion of sepsis at this time, more likely volume depleted. Will hold off on antibiotics at this time.  Patient denies having focal abdominal tenderness in the right upper and right lower quadrant.  CT abdomen pelvis is pending. Plan for admission, however CT to rule out any postoperative complication. At shift change, patient care transferred to Evelena Leyden, PA-C for continued evaluation, follow up of CTAP and call for admission.  Final Clinical Impression(s) / ED Diagnoses Final diagnoses:  Syncope, unspecified syncope type  AKI (acute Gonzales injury) (HCC)  Hypokalemia  Epigastric pain    Rx / DC Orders ED Discharge Orders    None           Emi Holes, PA-C 10/27/19 1603    Gwyneth Sprout, MD 10/28/19 1929

## 2019-10-27 NOTE — ED Triage Notes (Signed)
Pt brought in by brother who states that in the last 30 minutes pt went from A+Ox4 to unresponsive and hypotensive. Pt BP 80/60 on arrival. Pt brother states she recently had surgery on her gallbladder. Pt taken to trauma A immediately on arrival.

## 2019-10-28 ENCOUNTER — Encounter (HOSPITAL_COMMUNITY): Payer: Self-pay | Admitting: Internal Medicine

## 2019-10-28 ENCOUNTER — Other Ambulatory Visit: Payer: Self-pay

## 2019-10-28 DIAGNOSIS — E876 Hypokalemia: Secondary | ICD-10-CM | POA: Diagnosis present

## 2019-10-28 DIAGNOSIS — D649 Anemia, unspecified: Secondary | ICD-10-CM | POA: Diagnosis present

## 2019-10-28 DIAGNOSIS — E440 Moderate protein-calorie malnutrition: Secondary | ICD-10-CM | POA: Diagnosis present

## 2019-10-28 DIAGNOSIS — R9431 Abnormal electrocardiogram [ECG] [EKG]: Secondary | ICD-10-CM | POA: Diagnosis present

## 2019-10-28 DIAGNOSIS — E785 Hyperlipidemia, unspecified: Secondary | ICD-10-CM | POA: Diagnosis present

## 2019-10-28 DIAGNOSIS — K529 Noninfective gastroenteritis and colitis, unspecified: Secondary | ICD-10-CM

## 2019-10-28 DIAGNOSIS — N179 Acute kidney failure, unspecified: Secondary | ICD-10-CM | POA: Diagnosis present

## 2019-10-28 DIAGNOSIS — R55 Syncope and collapse: Secondary | ICD-10-CM | POA: Diagnosis present

## 2019-10-28 DIAGNOSIS — G8929 Other chronic pain: Secondary | ICD-10-CM | POA: Diagnosis present

## 2019-10-28 DIAGNOSIS — I10 Essential (primary) hypertension: Secondary | ICD-10-CM | POA: Diagnosis present

## 2019-10-28 DIAGNOSIS — E860 Dehydration: Secondary | ICD-10-CM | POA: Diagnosis present

## 2019-10-28 DIAGNOSIS — L89152 Pressure ulcer of sacral region, stage 2: Secondary | ICD-10-CM | POA: Diagnosis present

## 2019-10-28 DIAGNOSIS — Z9049 Acquired absence of other specified parts of digestive tract: Secondary | ICD-10-CM | POA: Diagnosis not present

## 2019-10-28 DIAGNOSIS — A09 Infectious gastroenteritis and colitis, unspecified: Secondary | ICD-10-CM | POA: Diagnosis present

## 2019-10-28 DIAGNOSIS — I959 Hypotension, unspecified: Secondary | ICD-10-CM | POA: Diagnosis present

## 2019-10-28 DIAGNOSIS — Z20822 Contact with and (suspected) exposure to covid-19: Secondary | ICD-10-CM | POA: Diagnosis present

## 2019-10-28 DIAGNOSIS — Z880 Allergy status to penicillin: Secondary | ICD-10-CM | POA: Diagnosis not present

## 2019-10-28 LAB — BASIC METABOLIC PANEL
Anion gap: 12 (ref 5–15)
Anion gap: 10 (ref 5–15)
BUN: 8 mg/dL (ref 8–23)
BUN: 7 mg/dL — ABNORMAL LOW (ref 8–23)
CO2: 24 mmol/L (ref 22–32)
CO2: 24 mmol/L (ref 22–32)
Calcium: 8.6 mg/dL — ABNORMAL LOW (ref 8.9–10.3)
Calcium: 8.8 mg/dL — ABNORMAL LOW (ref 8.9–10.3)
Chloride: 106 mmol/L (ref 98–111)
Chloride: 110 mmol/L (ref 98–111)
Creatinine, Ser: 0.84 mg/dL (ref 0.44–1.00)
Creatinine, Ser: 0.92 mg/dL (ref 0.44–1.00)
GFR calc Af Amer: >60 mL/min (ref >60)
GFR calc Af Amer: >60 mL/min (ref >60)
GFR calc non Af Amer: >60 mL/min (ref >60)
GFR calc non Af Amer: >60 mL/min (ref >60)
Glucose, Bld: 80 mg/dL (ref 70–99)
Glucose, Bld: 75 mg/dL (ref 70–99)
Potassium: 2.7 mmol/L — CL (ref 3.5–5.1)
Potassium: 3.6 mmol/L (ref 3.5–5.1)
Sodium: 142 mmol/L (ref 135–145)
Sodium: 144 mmol/L (ref 135–145)

## 2019-10-28 LAB — CBC WITH DIFFERENTIAL/PLATELET
Abs Immature Granulocytes: 0.03 K/uL (ref 0.00–0.07)
Basophils Absolute: 0 K/uL (ref 0.0–0.1)
Basophils Relative: 0 %
Eosinophils Absolute: 0 K/uL (ref 0.0–0.5)
Eosinophils Relative: 0 %
HCT: 30.3 % — ABNORMAL LOW (ref 36.0–46.0)
Hemoglobin: 10 g/dL — ABNORMAL LOW (ref 12.0–15.0)
Immature Granulocytes: 0 %
Lymphocytes Relative: 14 %
Lymphs Abs: 1 K/uL (ref 0.7–4.0)
MCH: 32.1 pg (ref 26.0–34.0)
MCHC: 33 g/dL (ref 30.0–36.0)
MCV: 97.1 fL (ref 80.0–100.0)
Monocytes Absolute: 0.6 K/uL (ref 0.1–1.0)
Monocytes Relative: 8 %
Neutro Abs: 5.6 K/uL (ref 1.7–7.7)
Neutrophils Relative %: 78 %
Platelets: 255 K/uL (ref 150–400)
RBC: 3.12 MIL/uL — ABNORMAL LOW (ref 3.87–5.11)
RDW: 15 % (ref 11.5–15.5)
WBC: 7.3 K/uL (ref 4.0–10.5)
nRBC: 0 % (ref 0.0–0.2)

## 2019-10-28 LAB — CBC
HCT: 33.7 % — ABNORMAL LOW (ref 36.0–46.0)
Hemoglobin: 11.2 g/dL — ABNORMAL LOW (ref 12.0–15.0)
MCH: 31.9 pg (ref 26.0–34.0)
MCHC: 33.2 g/dL (ref 30.0–36.0)
MCV: 96 fL (ref 80.0–100.0)
Platelets: 259 K/uL (ref 150–400)
RBC: 3.51 MIL/uL — ABNORMAL LOW (ref 3.87–5.11)
RDW: 14.8 % (ref 11.5–15.5)
WBC: 6.9 K/uL (ref 4.0–10.5)
nRBC: 0 % (ref 0.0–0.2)

## 2019-10-28 LAB — CREATININE, SERUM
Creatinine, Ser: 0.91 mg/dL (ref 0.44–1.00)
GFR calc Af Amer: >60 mL/min
GFR calc non Af Amer: >60 mL/min

## 2019-10-28 LAB — HEPATIC FUNCTION PANEL
ALT: 10 U/L (ref 0–44)
AST: 24 U/L (ref 15–41)
Albumin: 2.8 g/dL — ABNORMAL LOW (ref 3.5–5.0)
Alkaline Phosphatase: 71 U/L (ref 38–126)
Bilirubin, Direct: 0.2 mg/dL (ref 0.0–0.2)
Indirect Bilirubin: 0.5 mg/dL (ref 0.3–0.9)
Total Bilirubin: 0.7 mg/dL (ref 0.3–1.2)
Total Protein: 5.9 g/dL — ABNORMAL LOW (ref 6.5–8.1)

## 2019-10-28 LAB — MAGNESIUM: Magnesium: 2.5 mg/dL — ABNORMAL HIGH (ref 1.7–2.4)

## 2019-10-28 LAB — LACTIC ACID, PLASMA
Lactic Acid, Venous: 1 mmol/L (ref 0.5–1.9)
Lactic Acid, Venous: 1.4 mmol/L (ref 0.5–1.9)

## 2019-10-28 MED ORDER — HYDROMORPHONE HCL 1 MG/ML IJ SOLN
1.0000 mg | Freq: Once | INTRAMUSCULAR | Status: AC
  Administered 2019-10-28: 1 mg via INTRAVENOUS
  Filled 2019-10-28: qty 1

## 2019-10-28 MED ORDER — LORAZEPAM 2 MG/ML IJ SOLN
1.0000 mg | Freq: Once | INTRAMUSCULAR | Status: AC
  Administered 2019-10-28: 1 mg via INTRAVENOUS
  Filled 2019-10-28: qty 1

## 2019-10-28 MED ORDER — METRONIDAZOLE IN NACL 5-0.79 MG/ML-% IV SOLN
500.0000 mg | Freq: Three times a day (TID) | INTRAVENOUS | Status: DC
  Administered 2019-10-28 – 2019-10-30 (×8): 500 mg via INTRAVENOUS
  Filled 2019-10-28 (×8): qty 100

## 2019-10-28 MED ORDER — FENTANYL CITRATE (PF) 100 MCG/2ML IJ SOLN
25.0000 ug | Freq: Once | INTRAMUSCULAR | Status: AC
  Administered 2019-10-28: 25 ug via INTRAVENOUS
  Filled 2019-10-28: qty 2

## 2019-10-28 MED ORDER — POTASSIUM CHLORIDE CRYS ER 20 MEQ PO TBCR
40.0000 meq | EXTENDED_RELEASE_TABLET | Freq: Once | ORAL | Status: AC
  Administered 2019-10-28: 40 meq via ORAL
  Filled 2019-10-28: qty 2

## 2019-10-28 MED ORDER — HYDROMORPHONE HCL 1 MG/ML IJ SOLN
0.5000 mg | INTRAMUSCULAR | Status: DC | PRN
  Administered 2019-10-28 – 2019-10-29 (×2): 1 mg via INTRAVENOUS
  Filled 2019-10-28 (×2): qty 1

## 2019-10-28 MED ORDER — HYDRALAZINE HCL 20 MG/ML IJ SOLN: 10.0000 mg | INTRAMUSCULAR | Status: DC | PRN

## 2019-10-28 MED ORDER — CIPROFLOXACIN IN D5W 400 MG/200ML IV SOLN
400.0000 mg | Freq: Two times a day (BID) | INTRAVENOUS | Status: DC
  Administered 2019-10-28 – 2019-10-30 (×5): 400 mg via INTRAVENOUS
  Filled 2019-10-28 (×6): qty 200

## 2019-10-28 MED ORDER — POTASSIUM CHLORIDE IN NACL 20-0.9 MEQ/L-% IV SOLN
INTRAVENOUS | Status: DC
  Filled 2019-10-28 (×3): qty 1000
  Filled 2019-10-28: qty 2000
  Filled 2019-10-28: qty 1000

## 2019-10-28 MED ORDER — MORPHINE SULFATE (PF) 2 MG/ML IV SOLN
2.0000 mg | INTRAVENOUS | Status: AC | PRN
  Administered 2019-10-28 (×2): 2 mg via INTRAVENOUS
  Filled 2019-10-28 (×3): qty 1

## 2019-10-28 MED ORDER — FENTANYL CITRATE (PF) 100 MCG/2ML IJ SOLN
12.5000 ug | INTRAMUSCULAR | Status: DC | PRN
  Administered 2019-10-28 (×3): 12.5 ug via INTRAVENOUS
  Filled 2019-10-28 (×3): qty 2

## 2019-10-28 NOTE — Progress Notes (Signed)
[facility] PROGRESS NOTE  Samantha Gonzales IRJ:[id number] DOB: 1941/07/21 DOA: 10/27/2019 PCP: System, Pcp Not In  Assessment/Plan: #1. Colitis/abdominal pain. CT reveals right-sided colitis.  Distribution favors infection. Cholecystectomy with resolution of fluid collections in the operative bed. Mild biliary duct dilatation is chronic. Lactic acid within limits of normal.  Slight improvement this am. Cipro and flagyl started.  -continue cipro and flagyl -gentle IV fluids -clear liquids for now  #2. Syncope in setting of hypotension. Likely related to volume as patient has had decreased oral intake for 1 month and home meds include norvasc in setting of #1. SBP 80 on arrival. SBP this am 150 s/p IV fluids.  -continue gentle IV fluids -encourage po when appropriate -hold norvasc for now  #3. Hypokalemia/prolonged QT. - Repleat -repeat ekg -recheck this afternoon and in am -fluids with potassium as well as po  #4.Marland Kitchenacute kidney injury. Resolved this am. Likely related to above -hold nephrotoxins -gentle IV fluids -monitor urine output -recheck in am  #5. History of cholecystectomy followed by ERCP and stent placement with sphincterotomy 11/20.   #6. Hypertension. Home meds include amlodipine. Hypotensive at presentation as noted above. SBP improving -continue to hold amlodipine for now -resume when indicated likey tomorrow  Code Status: full Family Communication: daughter on phone Disposition Plan: home hopefully 24-36 hours   Consultants:    Procedures:    Antibiotics:  cipro 12/31>>>  Flagyl 12/31>>  HPI/Subjective: Awake alert. Complains pain in lower abdomen. Denies nausea  Objective: Vitals:   10/27/19 2143 10/28/19 0350  BP: (!) 137/55 (!) 150/54  Pulse: 70 81  Resp: 18 16  Temp: 98.2 F (36.8 C) 98.2 F (36.8 C)  SpO2: 100% 100%    Intake/Output Summary (Last 24 hours) at 10/28/2019 1213 Last data filed at 10/28/2019 0356 Gross per 24 hour   Intake 100 ml  Output 150 ml  Net -50 ml   Filed Weights   10/27/19 1420  Weight: 53.5 kg    Exam:   General:  Awake alert trying to get out of bed  Cardiovascular: rrr no mgr no LE edema  Respiratory: normal effort BS clear no crackles no wheeze  Abdomen: non-distended +BS but sluggish. Moderate pain to palpation in lower quadrant. No guarding or rebounding  Musculoskeletal: joints without swelling/erythema  Neuro: oriented to self and place. Bilateral grip 5/5   Data Reviewed: Basic Metabolic Panel: Recent Labs  Lab 10/27/19 1215 10/27/19 1532 10/28/19 0030 10/28/19 0556  NA 139  --   --  142  K 2.2*  --   --  2.7*  CL 97*  --   --  106  CO2 28  --   --  24  GLUCOSE 155*  --   --  80  BUN 10  --   --  8  CREATININE 1.34*  --  0.91 0.84  CALCIUM 9.2  --   --  8.6*  MG  --  2.1  --  2.5*   Liver Function Tests: Recent Labs  Lab 10/27/19 1215 10/28/19 0556  AST 25 24  ALT 11 10  ALKPHOS 73 71  BILITOT 0.6 0.7  PROT 6.2* 5.9*  ALBUMIN 2.8* 2.8*   Recent Labs  Lab 10/27/19 1215  LIPASE 23   No results for input(s): AMMONIA in the last 168 hours. CBC: Recent Labs  Lab 10/27/19 1215 10/28/19 0030 10/28/19 0556  WBC 5.8 6.9 7.3  NEUTROABS 4.6  --  5.6  HGB 11.3* 11.2* 10.0*  HCT 35.5* 33.7* 30.3*  MCV 99.4 96.0 97.1  PLT 284 259 255   Cardiac Enzymes: No results for input(s): CKTOTAL, CKMB, CKMBINDEX, TROPONINI in the last 168 hours. BNP (last 3 results) No results for input(s): BNP in the last 8760 hours.  ProBNP (last 3 results) No results for input(s): PROBNP in the last 8760 hours.  CBG: Recent Labs  Lab 10/27/19 1140 10/27/19 1148  GLUCAP 130* 111*    Recent Results (from the past 240 hour(s))  Culture, blood (routine x 2)     Status: None (Preliminary result)   Collection Time: 10/27/19 12:00 PM   Specimen: BLOOD  Result Value Ref Range Status   Specimen Description BLOOD RIGHT ANTECUBITAL  Final   Special Requests    Final    BOTTLES DRAWN AEROBIC AND ANAEROBIC Blood Culture results may not be optimal due to an inadequate volume of blood received in culture bottles   Culture   Final    NO GROWTH <12 HOURS Performed at [facility] [address], [place], [place] [address]    Report Status PENDING  Incomplete  SARS CORONAVIRUS 2 (TAT 6-24 HRS) Nasopharyngeal Nasopharyngeal Swab     Status: None   Collection Time: 10/27/19  7:04 PM   Specimen: Nasopharyngeal Swab  Result Value Ref Range Status   SARS Coronavirus 2 NEGATIVE NEGATIVE Final    Comment: (NOTE) SARS-CoV-2 target nucleic acids are NOT DETECTED. The SARS-CoV-2 RNA is generally detectable in upper and lower respiratory specimens during the acute phase of infection. Negative results do not preclude SARS-CoV-2 infection, do not rule out co-infections with other pathogens, and should not be used as the sole basis for treatment or other patient management decisions. Negative results must be combined with clinical observations, patient history, and epidemiological information. The expected result is Negative. Fact Sheet for Patients: SugarRoll.be Fact Sheet for Healthcare Providers: https://www.woods-mathews.com/ This test is not yet approved or cleared by the [place] FDA and  has been authorized for detection and/or diagnosis of SARS-CoV-2 by FDA under an Emergency Use Authorization (EUA). This EUA will remain  in effect (meaning this test can be used) for the duration of the COVID-19 declaration under Section 56 4(b)(1) of the Act, 21 U.S.C. section 360bbb-3(b)(1), unless the authorization is terminated or revoked sooner. Performed at [facility], [place] [address] [place], [place] [address]      Studies: CT ABDOMEN PELVIS W CONTRAST  Result Date: 10/27/2019 CLINICAL DATA:  Abdominal pain. Status post cholecystectomy. Epigastric pain. Syncope. Hypertension. Three weeks  postop. Recently had gallbladder surgery. ERCP and sphincterotomy 09/07/2019. Pancreatic stent placement 09/07/2019. Cholecystectomy 09/05/2019. EXAM: CT ABDOMEN AND PELVIS WITH CONTRAST TECHNIQUE: Multidetector CT imaging of the abdomen and pelvis was performed using the standard protocol following bolus administration of intravenous contrast. CONTRAST:  24mL OMNIPAQUE IOHEXOL 300 MG/ML  SOLN COMPARISON:  09/09/2014 FINDINGS: Lower chest: Clear lung bases. Normal heart size without pericardial or pleural effusion. Right coronary artery atherosclerosis. Hepatobiliary: Central right hepatic lobe 2.6 cm cyst. Other smaller hepatic lesions are too small to characterize, but likely cysts. Cholecystectomy. Resolution or drainage of previously described fluid collection in the operative bed. Pneumobilia which is presumably iatrogenic. Intrahepatic ducts mildly dilated for prior cholecystectomy state, slightly increased. The common duct measures 11 mm on 32/9 coronal. Similar to the prior exam. Pancreas: Normal pancreas for age. No duct dilatation or acute inflammation. Spleen: Normal in size, without focal abnormality. Adrenals/Urinary Tract: Normal adrenal glands. Bilateral renal cysts.  No hydronephrosis. Normal urinary bladder. Stomach/Bowel: Normal stomach, without wall thickening. The colon is underdistended. The right-side of the colon is thick walled with mild mucosal hyperenhancement, including on 40/3. Terminal ileum and appendix are difficult to delineate, but felt to be grossly normal. Normal small bowel caliber. Vascular/Lymphatic: Advanced aortic and branch vessel atherosclerosis. No abdominopelvic adenopathy. Reproductive: Uterus and adnexa not well evaluated. Prominent left greater than right gonadal veins. Other: Resolution or drainage of previously described pelvic cul-de-sac fluid collections. Exam degraded by paucity of intraabdominal/pelvic fat. Musculoskeletal: Left hip osteoarthritis.  Lumbosacral  spondylosis. IMPRESSION: 1. Mildly degraded exam secondary to paucity of abdominopelvic fat. 2. Right-sided colitis.  Distribution favors infection. 3. Cholecystectomy with resolution of fluid collections in the operative bed. Mild biliary duct dilatation is chronic. Consider correlation with bilirubin level. 4. 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 5. Coronary artery atherosclerosis. Aortic Atherosclerosis (ICD10-I70.0). Electronically Signed   By: Jeronimo Greaves M.D.   On: 10/27/2019 18:35    Scheduled Meds: . enoxaparin (LOVENOX) injection  40 mg Subcutaneous Daily  . rosuvastatin  20 mg Oral Daily   Continuous Infusions: . 0.9 % NaCl with KCl 20 mEq / L 75 mL/hr at 10/28/19 1207  . ciprofloxacin 400 mg (10/28/19 0630)  . metronidazole 500 mg (10/28/19 0538)    Principal Problem:   Colitis Active Problems:   AKI (acute kidney injury) (HCC)   Hypokalemia   Syncope   Essential hypertension    Time spent: 45 minutes    [facility] M NP  [facility]  If 7PM-7AM, please contact night-coverage at [email or web address], password [facility] 10/28/2019, 12:13 PM  LOS: 0 days

## 2019-10-28 NOTE — Plan of Care (Signed)

## 2019-10-28 NOTE — ED Provider Notes (Signed)
Ultrasound ED Peripheral IV (Provider)  Date/Time: 10/28/2019 7:31 PM Performed by: Gwyneth Sprout, MD Authorized by: Gwyneth Sprout, MD   Procedure details:    Indications: hypotension     Skin Prep: isopropyl alcohol     Location:  Right AC   Angiocath:  20 G   Bedside Ultrasound Guided: Yes     Images: not archived     Patient tolerated procedure without complications: Yes     Dressing applied: Yes        Gwyneth Sprout, MD 10/28/19 1931

## 2019-10-28 NOTE — Progress Notes (Signed)
CRITICAL VALUE ALERT  Critical Value:  K 2.7  Date & Time Notied:  10/28/2019.8:05 AM   Provider Notified: Dr. Benjamine Mola  Orders Received/Actions taken: Discussed with MD. Will recheck BMET later today.

## 2019-10-28 NOTE — Progress Notes (Signed)
Pt medicated as ordered for abdominal pain at 0227hrs. Has been complaining of no pain relief and asking for alternate pain medication. MD paged x2,waiting for response

## 2019-10-28 NOTE — Progress Notes (Addendum)
1950: Pt. Complaining of pain and requesting oxycodone. Pt. also stated that PRN fentanyl is not working for pt. pain control. This RN administered PRN oxycodone. While this RN was still in the room the pt. stated that "the two little pills were not helping". This RN explained to pt. that she needed to wait at least an hour to see if the pain medicine would work. Pt. also complained of not being able to eat anything all day and requested to have some real food. This RN explained to the pt. that she was on bowel rest to see if that help resolved her pain. The pt. then stated that she wanted the MD to either give her food or pain medicine because what we are doing is not helping. Pt. agreed to wait and see if the oxycodone would help her pain.  2040: Pt. called out again requesting pain medicine. This RN paged on call clinician as pt. refused to take the Fentanyl.  2110: Pt. Called out again requesting pain medication and this RN informed the pt. That she had just received pain medication 10 minutes ago. Pt. Stated "okay, fine whatever, just leave".  2125: Pt. has continued to call pout for pain medicine and refuses to take the Fentanyl of the Tylenol that is ordered. This RN paged on call clinician to ask for something to help the pt. relax.  2240: Administered PRN Fentanyl and one time order for Ativan. This RN reeducated pt. again on the importance of needing to space out pain medications in order to provide for th safety of the pt. This RN also reeducated pt. on the plan of care and why the pt. is on a clear liquid diet. The pt. appears very confused and continuously asks the same questions repeatedly even after staff has already answered them multiple times.

## 2019-10-29 ENCOUNTER — Inpatient Hospital Stay (HOSPITAL_COMMUNITY): Payer: Medicare Other

## 2019-10-29 DIAGNOSIS — L8990 Pressure ulcer of unspecified site, unspecified stage: Secondary | ICD-10-CM | POA: Insufficient documentation

## 2019-10-29 LAB — CBC
HCT: 29.8 % — ABNORMAL LOW (ref 36.0–46.0)
Hemoglobin: 10 g/dL — ABNORMAL LOW (ref 12.0–15.0)
MCH: 32.2 pg (ref 26.0–34.0)
MCHC: 33.6 g/dL (ref 30.0–36.0)
MCV: 95.8 fL (ref 80.0–100.0)
Platelets: 221 10*3/uL (ref 150–400)
RBC: 3.11 MIL/uL — ABNORMAL LOW (ref 3.87–5.11)
RDW: 15.3 % (ref 11.5–15.5)
WBC: 5.8 10*3/uL (ref 4.0–10.5)
nRBC: 0 % (ref 0.0–0.2)

## 2019-10-29 LAB — BASIC METABOLIC PANEL
Anion gap: 10 (ref 5–15)
Anion gap: 10 (ref 5–15)
BUN: <5 mg/dL — ABNORMAL LOW (ref 8–23)
BUN: <5 mg/dL — ABNORMAL LOW (ref 8–23)
CO2: 26 mmol/L (ref 22–32)
CO2: 24 mmol/L (ref 22–32)
Calcium: 8.8 mg/dL — ABNORMAL LOW (ref 8.9–10.3)
Calcium: 8.8 mg/dL — ABNORMAL LOW (ref 8.9–10.3)
Chloride: 106 mmol/L (ref 98–111)
Chloride: 106 mmol/L (ref 98–111)
Creatinine, Ser: 0.76 mg/dL (ref 0.44–1.00)
Creatinine, Ser: 0.74 mg/dL (ref 0.44–1.00)
GFR calc Af Amer: >60 mL/min (ref >60)
GFR calc Af Amer: >60 mL/min (ref >60)
GFR calc non Af Amer: >60 mL/min (ref >60)
GFR calc non Af Amer: >60 mL/min (ref >60)
Glucose, Bld: 78 mg/dL (ref 70–99)
Glucose, Bld: 83 mg/dL (ref 70–99)
Potassium: 2.6 mmol/L — CL (ref 3.5–5.1)
Potassium: 3.2 mmol/L — ABNORMAL LOW (ref 3.5–5.1)
Sodium: 142 mmol/L (ref 135–145)
Sodium: 140 mmol/L (ref 135–145)

## 2019-10-29 LAB — LACTIC ACID, PLASMA: Lactic Acid, Venous: 1.5 mmol/L (ref 0.5–1.9)

## 2019-10-29 MED ORDER — LORAZEPAM 2 MG/ML IJ SOLN
INTRAMUSCULAR | Status: AC
  Filled 2019-10-29: qty 1

## 2019-10-29 MED ORDER — FENTANYL CITRATE (PF) 100 MCG/2ML IJ SOLN
12.5000 ug | INTRAMUSCULAR | Status: DC | PRN
  Administered 2019-10-29 – 2019-10-30 (×2): 12.5 ug via INTRAVENOUS
  Filled 2019-10-29 (×2): qty 2

## 2019-10-29 MED ORDER — LORAZEPAM 2 MG/ML IJ SOLN
1.0000 mg | Freq: Once | INTRAMUSCULAR | Status: AC
  Administered 2019-10-29: 1 mg via INTRAVENOUS

## 2019-10-29 MED ORDER — HALOPERIDOL LACTATE 5 MG/ML IJ SOLN
1.0000 mg | Freq: Four times a day (QID) | INTRAMUSCULAR | Status: DC | PRN
  Administered 2019-10-29: 1 mg via INTRAVENOUS
  Filled 2019-10-29: qty 1

## 2019-10-29 MED ORDER — MORPHINE SULFATE (PF) 2 MG/ML IV SOLN
1.0000 mg | INTRAVENOUS | Status: DC | PRN
  Administered 2019-10-29: 1 mg via INTRAVENOUS
  Filled 2019-10-29: qty 1

## 2019-10-29 MED ORDER — POTASSIUM CHLORIDE CRYS ER 20 MEQ PO TBCR
40.0000 meq | EXTENDED_RELEASE_TABLET | Freq: Two times a day (BID) | ORAL | Status: AC
  Administered 2019-10-29 (×2): 40 meq via ORAL
  Filled 2019-10-29 (×2): qty 2

## 2019-10-29 NOTE — Progress Notes (Signed)
Progress Note    Samantha Gonzales  HER:[id number] DOB: 27-Aug-1941  DOA: 10/27/2019 PCP: System, Pcp Not In    Brief Narrative:     Medical records reviewed and are as summarized below:  Samantha Gonzales is an 79 y.o. female with history of hypertension, hyperlipidemia who was admitted 2 months ago for cholecystectomy at that time patient also underwent ERCP with stent placement following which patient also had some fluid collection in the gallbladder fossa for which patient had drain placed initially was discharged home.  Patient's family states that over the last 2 months even prior to the surgery patient has been having poor appetite with increasing abdominal pain.  No vomiting or diarrhea.  Over the last 24 hours patient worsened and patient also had a brief syncopal episode.  Last for few seconds.  Patient was brought to the ER by the family.  Assessment/Plan:   Principal Problem:   Colitis Active Problems:   AKI (acute kidney injury) (HCC)   Syncope   Hypokalemia   Essential hypertension   Pressure injury of skin   Colitis/abdominal pain-- recent cholecystectomy  -CT revealsright-sided colitis.  -Distribution favors infection.  -Cholecystectomy with resolution of fluid collections in the operative bed. Mild biliary duct dilatation is chronic.  - Cipro and flagyl IV-- change to PO when able.  -continue cipro and flagyl -advance diet to soft   Syncope in setting of hypotension. Likely related to volume as patient has had decreased oral intake for 1 month and home bp meds   -continue gentle IV fluids -encourage po when appropriate -hold norvasc for now   Hypokalemia/prolonged QT. - Replace aggressively -recheck in AM  acute kidney injury.  -Resolved with IVF  History of cholecystectomy followed by ERCP and stent placement with sphincterotomy 11/20.  -appearance improved on CT scan  Hypertension. -hold home medications  Chronic pain -continue home  medications  Memory issues -lives with family at home -outpatient follow up   Family Communication/Anticipated D/C date and plan/Code Status    Disposition Plan: home once K is stable and tolerating PO intake   Medical Consultants:    None.    Subjective:   Will ask for more pain medication as pain medication is being administered  Objective:    Vitals:   10/28/19 1532 10/28/19 2144 10/29/19 0500 10/29/19 0530  BP: 124/66 (!) 145/63  132/62  Pulse: 82 78  70  Resp:  18  18  Temp: 99.1 F (37.3 C) 98.6 F (37 C)  98.7 F (37.1 C)  TempSrc: Oral Oral  Oral  SpO2: 100% 99%  99%  Weight:   53.5 kg     Intake/Output Summary (Last 24 hours) at 10/29/2019 1215 Last data filed at 10/29/2019 0300 Gross per 24 hour  Intake 2215.44 ml  Output --  Net 2215.44 ml   Filed Weights   10/27/19 1420 10/29/19 0500  Weight: 53.5 kg 53.5 kg    Exam: In bed, forgetful Poor insight rrr +BS, NT  Data Reviewed:   I have personally reviewed following labs and imaging studies:  Labs: Labs show the following:   Basic Metabolic Panel: Recent Labs  Lab 10/27/19 1215 10/27/19 1532 10/28/19 0030 10/28/19 0556 10/28/19 1518 10/29/19 0247  NA 139  --   --  142 144 142  K 2.2*  --   --  2.7* 3.6 2.6*  CL 97*  --   --  106 110 106  CO2 28  --   --  24 24 26   GLUCOSE 155*  --   --  80 75 78  BUN 10  --   --  8 7* <5*  CREATININE 1.34*  --  0.91 0.84 0.92 0.76  CALCIUM 9.2  --   --  8.6* 8.8* 8.8*  MG  --  2.1  --  2.5*  --   --    GFR Estimated Creatinine Clearance: 50.5 mL/min (by C-G formula based on SCr of 0.76 mg/dL). Liver Function Tests: Recent Labs  Lab 10/27/19 1215 10/28/19 0556  AST 25 24  ALT 11 10  ALKPHOS 73 71  BILITOT 0.6 0.7  PROT 6.2* 5.9*  ALBUMIN 2.8* 2.8*   Recent Labs  Lab 10/27/19 1215  LIPASE 23   No results for input(s): AMMONIA in the last 168 hours. Coagulation profile No results for input(s): INR, PROTIME in the last 168  hours.  CBC: Recent Labs  Lab 10/27/19 1215 10/28/19 0030 10/28/19 0556 10/29/19 0247  WBC 5.8 6.9 7.3 5.8  NEUTROABS 4.6  --  5.6  --   HGB 11.3* 11.2* 10.0* 10.0*  HCT 35.5* 33.7* 30.3* 29.8*  MCV 99.4 96.0 97.1 95.8  PLT 284 259 255 221   Cardiac Enzymes: No results for input(s): CKTOTAL, CKMB, CKMBINDEX, TROPONINI in the last 168 hours. BNP (last 3 results) No results for input(s): PROBNP in the last 8760 hours. CBG: Recent Labs  Lab 10/27/19 1140 10/27/19 1148  GLUCAP 130* 111*   D-Dimer: No results for input(s): DDIMER in the last 72 hours. Hgb A1c: No results for input(s): HGBA1C in the last 72 hours. Lipid Profile: No results for input(s): CHOL, HDL, LDLCALC, TRIG, CHOLHDL, LDLDIRECT in the last 72 hours. Thyroid function studies: No results for input(s): TSH, T4TOTAL, T3FREE, THYROIDAB in the last 72 hours.  Invalid input(s): FREET3 Anemia work up: No results for input(s): VITAMINB12, FOLATE, FERRITIN, TIBC, IRON, RETICCTPCT in the last 72 hours. Sepsis Labs: Recent Labs  Lab 10/27/19 1215 10/27/19 1220 10/27/19 1542 10/28/19 0030 10/28/19 0556 10/28/19 0859 10/29/19 0247  WBC 5.8  --   --  6.9 7.3  --  5.8  LATICACIDVEN  --  3.4* 2.1*  --  1.0 1.4  --     Microbiology Recent Results (from the past 240 hour(s))  Culture, blood (routine x 2)     Status: None (Preliminary result)   Collection Time: 10/27/19 12:00 PM   Specimen: BLOOD  Result Value Ref Range Status   Specimen Description BLOOD RIGHT ANTECUBITAL  Final   Special Requests   Final    BOTTLES DRAWN AEROBIC AND ANAEROBIC Blood Culture results may not be optimal due to an inadequate volume of blood received in culture bottles   Culture   Final    NO GROWTH 2 DAYS Performed at [facility], [place] [address] [place], [place] [address]    Report Status PENDING  Incomplete  Culture, blood (routine x 2)     Status: None (Preliminary result)   Collection Time: 10/27/19  4:13 PM    Specimen: Site Not Specified; Blood  Result Value Ref Range Status   Specimen Description SITE NOT SPECIFIED  Final   Special Requests   Final    BOTTLES DRAWN AEROBIC ONLY Blood Culture results may not be optimal due to an inadequate volume of blood received in culture bottles   Culture   Final    NO GROWTH 2 DAYS Performed at [facility], [place] [address]  [place], [place] [address]    Report Status PENDING  Incomplete  SARS CORONAVIRUS 2 (TAT 6-24 HRS) Nasopharyngeal Nasopharyngeal Swab     Status: None   Collection Time: 10/27/19  7:04 PM   Specimen: Nasopharyngeal Swab  Result Value Ref Range Status   SARS Coronavirus 2 NEGATIVE NEGATIVE Final    Comment: (NOTE) SARS-CoV-2 target nucleic acids are NOT DETECTED. The SARS-CoV-2 RNA is generally detectable in upper and lower respiratory specimens during the acute phase of infection. Negative results do not preclude SARS-CoV-2 infection, do not rule out co-infections with other pathogens, and should not be used as the sole basis for treatment or other patient management decisions. Negative results must be combined with clinical observations, patient history, and epidemiological information. The expected result is Negative. Fact Sheet for Patients: HairSlick.no Fact Sheet for Healthcare Providers: quierodirigir.com This test is not yet approved or cleared by the [place] FDA and  has been authorized for detection and/or diagnosis of SARS-CoV-2 by FDA under an Emergency Use Authorization (EUA). This EUA will remain  in effect (meaning this test can be used) for the duration of the COVID-19 declaration under Section 56 4(b)(1) of the Act, 21 U.S.C. section 360bbb-3(b)(1), unless the authorization is terminated or revoked sooner. Performed at [facility], [address] [place], [place] [address]     Procedures and diagnostic studies:  CT ABDOMEN PELVIS W  CONTRAST  Result Date: 10/27/2019 CLINICAL DATA:  Abdominal pain. Status post cholecystectomy. Epigastric pain. Syncope. Hypertension. Three weeks postop. Recently had gallbladder surgery. ERCP and sphincterotomy 09/07/2019. Pancreatic stent placement 09/07/2019. Cholecystectomy 09/05/2019. EXAM: CT ABDOMEN AND PELVIS WITH CONTRAST TECHNIQUE: Multidetector CT imaging of the abdomen and pelvis was performed using the standard protocol following bolus administration of intravenous contrast. CONTRAST:  69mL OMNIPAQUE IOHEXOL 300 MG/ML  SOLN COMPARISON:  09/09/2014 FINDINGS: Lower chest: Clear lung bases. Normal heart size without pericardial or pleural effusion. Right coronary artery atherosclerosis. Hepatobiliary: Central right hepatic lobe 2.6 cm cyst. Other smaller hepatic lesions are too small to characterize, but likely cysts. Cholecystectomy. Resolution or drainage of previously described fluid collection in the operative bed. Pneumobilia which is presumably iatrogenic. Intrahepatic ducts mildly dilated for prior cholecystectomy state, slightly increased. The common duct measures 11 mm on 32/9 coronal. Similar to the prior exam. Pancreas: Normal pancreas for age. No duct dilatation or acute inflammation. Spleen: Normal in size, without focal abnormality. Adrenals/Urinary Tract: Normal adrenal glands. Bilateral renal cysts. No hydronephrosis. Normal urinary bladder. Stomach/Bowel: Normal stomach, without wall thickening. The colon is underdistended. The right-side of the colon is thick walled with mild mucosal hyperenhancement, including on 40/3. Terminal ileum and appendix are difficult to delineate, but felt to be grossly normal. Normal small bowel caliber. Vascular/Lymphatic: Advanced aortic and branch vessel atherosclerosis. No abdominopelvic adenopathy. Reproductive: Uterus and adnexa not well evaluated. Prominent left greater than right gonadal veins. Other: Resolution or drainage of previously described  pelvic cul-de-sac fluid collections. Exam degraded by paucity of intraabdominal/pelvic fat. Musculoskeletal: Left hip osteoarthritis.  Lumbosacral spondylosis. IMPRESSION: 1. Mildly degraded exam secondary to paucity of abdominopelvic fat. 2. Right-sided colitis.  Distribution favors infection. 3. Cholecystectomy with resolution of fluid collections in the operative bed. Mild biliary duct dilatation is chronic. Consider correlation with bilirubin level. 4. 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 5. Coronary artery atherosclerosis. Aortic Atherosclerosis (ICD10-I70.0). Electronically Signed   By: Jeronimo Greaves M.D.   On: 10/27/2019 18:35    Medications:   .  enoxaparin (LOVENOX) injection  40 mg Subcutaneous Daily  . rosuvastatin  20 mg Oral Daily   Continuous Infusions: . 0.9 % NaCl with KCl 20 mEq / L 75 mL/hr at 10/28/19 1217  . ciprofloxacin 400 mg (10/29/19 0846)  . metronidazole 500 mg (10/29/19 0528)     LOS: 1 day   Joseph Art  [facility]   How to contact the [facility] Attending or Consulting provider 7A - 7P or covering provider during after hours 7P -7A, for this patient?  1. Check the care team in [facility] and look for a) attending/consulting TRH provider listed and b) the [facility] team listed 2. Log into [email or web address] and use [place]'s universal password to access. If you do not have the password, please contact the hospital operator. 3. Locate the [facility] provider you are looking for under [facility] and page to a number that you can be directly reached. 4. If you still have difficulty reaching the provider, please page the [facility] (Director on Call) for the Hospitalists listed on amion for assistance.  10/29/2019, 12:15 PM

## 2019-10-29 NOTE — Progress Notes (Addendum)
CRITICAL VALUE ALERT  Critical Value:  Potassium 2.6  Date & Time Notied:  10/29/2019 @ 0439  Provider Notified: Rana Snare, NP @ [phone]  Orders Received/Actions taken: 40 mEq Potassium Chloride BID

## 2019-10-29 NOTE — Progress Notes (Signed)
Patient states Fentanyl doesn't relieve her pain and that only Oxycodone helps with pain. Patient expresses she is still in pain. She also requests something to help her sleep. This nurse reached out to the Dr on call and made aware of requests. At this time no new orders placed. Will round more frequently on patient and update them on plan of care as well as educate on narcotics and potential for addiction.

## 2019-10-29 NOTE — Progress Notes (Signed)
Patient impulsively jumped out of bed. When this nurse arrived to room patient was peeing in the trash can. She never pushed her call bell to make staff aware of needing to use the BR. This nurse educated patient on importance of using the call bell when needign assistance OOB. This nurse and tech changed bed linens, patient gown and cleaned the floor as well as put BSC right next to the bed. Again educated patient on importance of calling staff for assistance, put bed alarm on, and made patient aware alarm was on.

## 2019-10-30 LAB — BASIC METABOLIC PANEL
Anion gap: 9 (ref 5–15)
BUN: <5 mg/dL — ABNORMAL LOW (ref 8–23)
CO2: 26 mmol/L (ref 22–32)
Calcium: 8.8 mg/dL — ABNORMAL LOW (ref 8.9–10.3)
Chloride: 108 mmol/L (ref 98–111)
Creatinine, Ser: 0.76 mg/dL (ref 0.44–1.00)
GFR calc Af Amer: >60 mL/min (ref >60)
GFR calc non Af Amer: >60 mL/min (ref >60)
Glucose, Bld: 86 mg/dL (ref 70–99)
Potassium: 2.9 mmol/L — ABNORMAL LOW (ref 3.5–5.1)
Sodium: 143 mmol/L (ref 135–145)

## 2019-10-30 LAB — CBC
HCT: 28.4 % — ABNORMAL LOW (ref 36.0–46.0)
Hemoglobin: 9.3 g/dL — ABNORMAL LOW (ref 12.0–15.0)
MCH: 32 pg (ref 26.0–34.0)
MCHC: 32.7 g/dL (ref 30.0–36.0)
MCV: 97.6 fL (ref 80.0–100.0)
Platelets: 237 10*3/uL (ref 150–400)
RBC: 2.91 MIL/uL — ABNORMAL LOW (ref 3.87–5.11)
RDW: 15.2 % (ref 11.5–15.5)
WBC: 5.9 10*3/uL (ref 4.0–10.5)
nRBC: 0 % (ref 0.0–0.2)

## 2019-10-30 MED ORDER — DOCUSATE SODIUM 100 MG PO CAPS
100.0000 mg | ORAL_CAPSULE | Freq: Two times a day (BID) | ORAL | Status: DC
  Administered 2019-10-30 – 2019-10-31 (×3): 100 mg via ORAL
  Filled 2019-10-30 (×3): qty 1

## 2019-10-30 MED ORDER — CIPROFLOXACIN HCL 500 MG PO TABS
500.0000 mg | ORAL_TABLET | Freq: Two times a day (BID) | ORAL | Status: DC
  Administered 2019-10-30 – 2019-10-31 (×2): 500 mg via ORAL
  Filled 2019-10-30 (×2): qty 1

## 2019-10-30 MED ORDER — OXYCODONE HCL 5 MG PO TABS: 10.0000 mg | ORAL_TABLET | Freq: Four times a day (QID) | ORAL | Status: DC | PRN

## 2019-10-30 MED ORDER — POTASSIUM CHLORIDE CRYS ER 20 MEQ PO TBCR
40.0000 meq | EXTENDED_RELEASE_TABLET | Freq: Two times a day (BID) | ORAL | Status: DC
  Administered 2019-10-30: 40 meq via ORAL
  Filled 2019-10-30: qty 2

## 2019-10-30 MED ORDER — HYDROMORPHONE HCL 1 MG/ML IJ SOLN
0.5000 mg | INTRAMUSCULAR | Status: DC | PRN
  Administered 2019-10-30: 0.5 mg via INTRAVENOUS
  Filled 2019-10-30: qty 1

## 2019-10-30 MED ORDER — POTASSIUM CHLORIDE CRYS ER 20 MEQ PO TBCR
40.0000 meq | EXTENDED_RELEASE_TABLET | Freq: Once | ORAL | Status: AC
  Administered 2019-10-30: 40 meq via ORAL
  Filled 2019-10-30: qty 2

## 2019-10-30 MED ORDER — OXYCODONE HCL 5 MG PO TABS
10.0000 mg | ORAL_TABLET | ORAL | Status: DC | PRN
  Administered 2019-10-30 – 2019-10-31 (×6): 10 mg via ORAL
  Filled 2019-10-30 (×6): qty 2

## 2019-10-30 MED ORDER — METRONIDAZOLE 500 MG PO TABS
500.0000 mg | ORAL_TABLET | Freq: Three times a day (TID) | ORAL | Status: DC
  Administered 2019-10-30 – 2019-10-31 (×4): 500 mg via ORAL
  Filled 2019-10-30 (×4): qty 1

## 2019-10-30 NOTE — Progress Notes (Addendum)
Progress Note    Samantha Gonzales  KHT:[id number] DOB: December 13, 1940  DOA: 10/27/2019 PCP: System, Pcp Not In    Brief Narrative:     Medical records reviewed and are as summarized below:  Samantha Gonzales is an 79 y.o. female with history of hypertension, hyperlipidemia who was admitted 2 months ago for cholecystectomy at that time patient also underwent ERCP with stent placement following which patient also had some fluid collection in the gallbladder fossa for which patient had drain placed initially was discharged home.  Patient's family states that over the last 2 months even prior to the surgery patient has been having poor appetite with increasing abdominal pain.  No vomiting or diarrhea.  Over the last 24 hours patient worsened and patient also had a brief syncopal episode.  Last for few seconds.  Patient was brought to the ER by the family.  Assessment/Plan:   Principal Problem:   Colitis Active Problems:   AKI (acute kidney injury) (HCC)   Syncope   Hypokalemia   Essential hypertension   Pressure injury of skin   Colitis/abdominal pain-- recent cholecystectomy  -CT revealsright-sided colitis.  -Distribution favors infection.  -Cholecystectomy with resolution of fluid collections in the operative bed. Mild biliary duct dilatation is chronic.  - Cipro and flagyl IV-- change to PO when able.  -advance diet to soft -Patient requesting increasing p.o. pain medications: States she takes 2 pills at a time at home instead of 1 as documented on her [facility] -Patient is also states that she is not eating, nurse as well as documentation in the chart confirms she is eating   Syncope in setting of hypotension. Likely related to volume as patient has had decreased oral intake for 1 month and home bp meds   -continue gentle IV fluids -encourage po when appropriate -hold norvasc for now   Hypokalemia/prolonged QT. - Replace aggressively Continues to have hypokalemia will place on  daily replacement  acute kidney injury.  -Resolved with IVF  History of cholecystectomy followed by ERCP and stent placement with sphincterotomy 11/20.  -appearance improved on CT scan  Hypertension. -hold home medications  Chronic pain -continue home medications  Memory issues -lives with family at home -outpatient follow up   Family Communication/Anticipated D/C date and plan/Code Status    Disposition Plan: home once K is stable and tolerating PO intake   Medical Consultants:    None.    Subjective:   Says she takes her pain medicines at home not like on her home med rec she may take 2 of the pills at a time and she takes them every 4 hours when she is hurting Patient states she is not eating but I spoke with the nurse and patient did eat 100% of her meal, not sure if patient is forgetful or purposefully misleading   Objective:    Vitals:   10/29/19 0530 10/29/19 1410 10/29/19 2023 10/30/19 0627  BP: 132/62 (!) 153/71 138/71 (!) 149/72  Pulse: 70 86 86 87  Resp: 18 18 18 18   Temp: 98.7 F (37.1 C) 98.9 F (37.2 C) 98.9 F (37.2 C) 98.6 F (37 C)  TempSrc: Oral Oral Oral Oral  SpO2: 99% 100% 100% 100%  Weight:    53 kg    Intake/Output Summary (Last 24 hours) at 10/30/2019 1508 Last data filed at 10/30/2019 0700 Gross per 24 hour  Intake 360 ml  Output 1000 ml  Net -640 ml   12/28/2019  10/27/19 1420 10/29/19 0500 10/30/19 0627  Weight: 53.5 kg 53.5 kg 53 kg    Exam: In bed appears comfortable Alert Positive bowel sounds soft mildly tender Regular rate and rhythm No increased work of breathing Moves all 4 extremities Poor dentition  Data Reviewed:   I have personally reviewed following labs and imaging studies:  Labs: Labs show the following:   Basic Metabolic Panel: Recent Labs  Lab 10/27/19 1532 10/28/19 0556 10/28/19 1518 10/29/19 0247 10/29/19 1843 10/30/19 0151  NA  --  142 144 142 140 143  K  --  2.7* 3.6 2.6*  3.2* 2.9*  CL  --  106 110 106 106 108  CO2  --  24 24 26 24 26   GLUCOSE  --  80 75 78 83 86  BUN  --  8 7* <5* <5* <5*  CREATININE  --  0.84 0.92 0.76 0.74 0.76  CALCIUM  --  8.6* 8.8* 8.8* 8.8* 8.8*  MG 2.1 2.5*  --   --   --   --    GFR Estimated Creatinine Clearance: 50.1 mL/min (by C-G formula based on SCr of 0.76 mg/dL). Liver Function Tests: Recent Labs  Lab 10/27/19 1215 10/28/19 0556  AST 25 24  ALT 11 10  ALKPHOS 73 71  BILITOT 0.6 0.7  PROT 6.2* 5.9*  ALBUMIN 2.8* 2.8*   Recent Labs  Lab 10/27/19 1215  LIPASE 23   No results for input(s): AMMONIA in the last 168 hours. Coagulation profile No results for input(s): INR, PROTIME in the last 168 hours.  CBC: Recent Labs  Lab 10/27/19 1215 10/28/19 0030 10/28/19 0556 10/29/19 0247 10/30/19 0151  WBC 5.8 6.9 7.3 5.8 5.9  NEUTROABS 4.6  --  5.6  --   --   HGB 11.3* 11.2* 10.0* 10.0* 9.3*  HCT 35.5* 33.7* 30.3* 29.8* 28.4*  MCV 99.4 96.0 97.1 95.8 97.6  PLT 284 259 255 221 237   Cardiac Enzymes: No results for input(s): CKTOTAL, CKMB, CKMBINDEX, TROPONINI in the last 168 hours. BNP (last 3 results) No results for input(s): PROBNP in the last 8760 hours. CBG: Recent Labs  Lab 10/27/19 1140 10/27/19 1148  GLUCAP 130* 111*   D-Dimer: No results for input(s): DDIMER in the last 72 hours. Hgb A1c: No results for input(s): HGBA1C in the last 72 hours. Lipid Profile: No results for input(s): CHOL, HDL, LDLCALC, TRIG, CHOLHDL, LDLDIRECT in the last 72 hours. Thyroid function studies: No results for input(s): TSH, T4TOTAL, T3FREE, THYROIDAB in the last 72 hours.  Invalid input(s): FREET3 Anemia work up: No results for input(s): VITAMINB12, FOLATE, FERRITIN, TIBC, IRON, RETICCTPCT in the last 72 hours. Sepsis Labs: Recent Labs  Lab 10/27/19 1542 10/28/19 0030 10/28/19 0556 10/28/19 0859 10/29/19 0247 10/29/19 1843 10/30/19 0151  WBC  --  6.9 7.3  --  5.8  --  5.9  LATICACIDVEN 2.1*  --  1.0  1.4  --  1.5  --     Microbiology Recent Results (from the past 240 hour(s))  Culture, blood (routine x 2)     Status: None (Preliminary result)   Collection Time: 10/27/19 12:00 PM   Specimen: BLOOD  Result Value Ref Range Status   Specimen Description BLOOD RIGHT ANTECUBITAL  Final   Special Requests   Final    BOTTLES DRAWN AEROBIC AND ANAEROBIC Blood Culture results may not be optimal due to an inadequate volume of blood received in culture bottles   Culture   Final  NO GROWTH 3 DAYS Performed at [facility], [place] [address] [place], [place] [address]    Report Status PENDING  Incomplete  Culture, blood (routine x 2)     Status: None (Preliminary result)   Collection Time: 10/27/19  4:13 PM   Specimen: Site Not Specified; Blood  Result Value Ref Range Status   Specimen Description SITE NOT SPECIFIED  Final   Special Requests   Final    BOTTLES DRAWN AEROBIC ONLY Blood Culture results may not be optimal due to an inadequate volume of blood received in culture bottles   Culture   Final    NO GROWTH 3 DAYS Performed at [facility], [place] [address] [place], [place] [address]    Report Status PENDING  Incomplete  SARS CORONAVIRUS 2 (TAT 6-24 HRS) Nasopharyngeal Nasopharyngeal Swab     Status: None   Collection Time: 10/27/19  7:04 PM   Specimen: Nasopharyngeal Swab  Result Value Ref Range Status   SARS Coronavirus 2 NEGATIVE NEGATIVE Final    Comment: (NOTE) SARS-CoV-2 target nucleic acids are NOT DETECTED. The SARS-CoV-2 RNA is generally detectable in upper and lower respiratory specimens during the acute phase of infection. Negative results do not preclude SARS-CoV-2 infection, do not rule out co-infections with other pathogens, and should not be used as the sole basis for treatment or other patient management decisions. Negative results must be combined with clinical observations, patient history, and epidemiological information. The expected result is  Negative. Fact Sheet for Patients: SugarRoll.be Fact Sheet for Healthcare Providers: https://www.woods-mathews.com/ This test is not yet approved or cleared by the [place] FDA and  has been authorized for detection and/or diagnosis of SARS-CoV-2 by FDA under an Emergency Use Authorization (EUA). This EUA will remain  in effect (meaning this test can be used) for the duration of the COVID-19 declaration under Section 56 4(b)(1) of the Act, 21 U.S.C. section 360bbb-3(b)(1), unless the authorization is terminated or revoked sooner. Performed at [facility], [place] [address], [place],  [address]     Procedures and diagnostic studies:  DG Abd 1 View  Result Date: 10/29/2019 CLINICAL DATA:  Abdominal pain EXAM: ABDOMEN - 1 VIEW COMPARISON:  CT from 10/27/2019 FINDINGS: Scattered large and small bowel gas is noted. Mild retained fecal material is seen without obstructive change. No free air is noted. No acute bony abnormality is noted. IMPRESSION: Mild retained fecal material without obstructive change. Electronically Signed   By: Inez Catalina M.D.   On: 10/29/2019 19:17    Medications:   . docusate sodium  100 mg Oral BID  . enoxaparin (LOVENOX) injection  40 mg Subcutaneous Daily  . potassium chloride  40 mEq Oral BID  . rosuvastatin  20 mg Oral Daily   Continuous Infusions: . 0.9 % NaCl with KCl 20 mEq / L 75 mL/hr at 10/28/19 1217  . ciprofloxacin 400 mg (10/30/19 0754)  . metronidazole 500 mg (10/30/19 1425)     LOS: 2 days   Geradine Girt  [facility]   How to contact the [facility] Attending or Consulting provider [place] or covering provider during after hours [place], for this patient?  1. Check the care team in [place] and look for a) attending/consulting TRH provider listed and b) the [facility] team listed 2. Log into [email or web address] and use [place]'s universal password to access. If you do not have the password, please  contact the hospital operator. 3. Locate the  TRH provider you are looking for under [facility] and page to a number that you can be directly reached. 4. If you still have difficulty reaching the provider, please page the [facility] (Director on Call) for the Hospitalists listed on amion for assistance.  10/30/2019, 3:08 PM

## 2019-10-31 LAB — BASIC METABOLIC PANEL
Anion gap: 8 (ref 5–15)
BUN: <5 mg/dL — ABNORMAL LOW (ref 8–23)
CO2: 24 mmol/L (ref 22–32)
Calcium: 9 mg/dL (ref 8.9–10.3)
Chloride: 110 mmol/L (ref 98–111)
Creatinine, Ser: 0.82 mg/dL (ref 0.44–1.00)
GFR calc Af Amer: >60 mL/min (ref >60)
GFR calc non Af Amer: >60 mL/min (ref >60)
Glucose, Bld: 85 mg/dL (ref 70–99)
Potassium: 4.2 mmol/L (ref 3.5–5.1)
Sodium: 142 mmol/L (ref 135–145)

## 2019-10-31 LAB — MAGNESIUM: Magnesium: 1.7 mg/dL (ref 1.7–2.4)

## 2019-10-31 MED ORDER — CIPROFLOXACIN HCL 500 MG PO TABS: 500.0000 mg | ORAL_TABLET | Freq: Two times a day (BID) | ORAL | 0 refills | Status: AC

## 2019-10-31 MED ORDER — METRONIDAZOLE 500 MG PO TABS: 500.0000 mg | ORAL_TABLET | Freq: Three times a day (TID) | ORAL | 0 refills | Status: AC

## 2019-10-31 NOTE — Social Work (Signed)
CSW spoke with bedside RN, pt and pt family declining SNF at this time. CSW signing off. Please consult if any additional needs arise.   Doy Hutching, LCSWA [place] Clinical Social Work

## 2019-10-31 NOTE — Plan of Care (Signed)
  Problem: Activity: Goal: Risk for activity intolerance will decrease Outcome: Progressing   Problem: Nutrition: Goal: Adequate nutrition will be maintained Outcome: Progressing   Problem: Elimination: Goal: Will not experience complications related to urinary retention Outcome: Progressing   

## 2019-10-31 NOTE — Discharge Summary (Signed)
Physician Discharge Summary  Samantha Gonzales ZGY:[id number] DOB: 04-09-1941 DOA: 10/27/2019  PCP: System, Pcp Not In  Admit date: 10/27/2019 Discharge date: 10/31/2019  Time spent: 45 minutes  Recommendations for Outpatient Follow-up:  1. Follow-up with PCP 1 to 2 weeks for evaluation of symptoms.  Recommend basic metabolic panel to track potassium level   Discharge Diagnoses:  Principal Problem:   Colitis Active Problems:   AKI (acute kidney injury) (HCC)   Hypokalemia   Syncope   Essential hypertension   Pressure injury of skin   Discharge Condition: Stable tolerating soft diet  Diet recommendation: Soft diet to be advanced as tolerated  Filed Weights   10/29/19 0500 10/30/19 0627 10/31/19 0556  Weight: 53.5 kg 53 kg 53.5 kg    History of present illness:  Samantha Gonzales is a 79 y.o. female with history of hypertension, hyperlipidemia who was admitted 2 months prior to this admission on 10/27/19 for cholecystectomy at that time patient also underwent ERCP with stent placement following which patient also had some fluid collection in the gallbladder fossa for which patient had drain placed initially was discharged home.  Patient's family stated that over the previous 2 months even prior to the surgery patient had been having poor appetite with increasing abdominal pain.  No vomiting or diarrhea.  Over the previous 24 hours patient worsened and patient also had a brief syncopal episode. Reportedly lasted for few seconds.  Patient was brought to the ER by the family.  Hospital Course:   Colitis/abdominal pain-- recent cholecystectomy  -CT revealsright-sided colitis.  -Distribution favored infection.  -Cholecystectomy with resolution of fluid collections in the operative bed. Mild biliary duct dilatation is chronic. - Cipro and flagyl IV provided and transitioned to  PO 10/30/19. Will discharge with 4 more days of cipro and flagyl po -tolerating soft diet -Patient frequently  requested increasing p.o. pain medications: Stated she takes 2 pills at a time at home instead of 1 as documented on her [facility] -Patient also stated that she is was not eating, discussion with nurse as well as documentation in the chart confirms she is eating   Syncope in setting of hypotension. Likely related to volume as patient has had decreased oral intake for 1 month and home bp meds. Resolved with IV fluids. Home norvasc held. At discharge BP high end of normal. Will resume norvasc at discharge. OP follow up   Hypokalemia/prolonged QT.  Replaced aggressively. At discharge potassium level 4.2. recommend BMET 1-2 weeks to track potassium level.   acute kidney injury. Resolved with IVF  History of cholecystectomy followed by ERCP and stent placement with sphincterotomy 11/20.  -appearance improved on CT scan  Hypertension. See above  Chronic pain. continue home medications Procedures:    Consultations:    Discharge Exam: Vitals:   10/30/19 2103 10/31/19 0521  BP: (!) 148/81 (!) 142/75  Pulse: 81 80  Resp: 16 18  Temp: 98.7 F (37.1 C) 98.4 F (36.9 C)  SpO2: 100% 100%    General: Awake alert no acute distress Cardiovascular: Regular rate and rhythm I hear no murmur gallop or rub no lower extremity edema Respiratory: No increased work of breathing breath sounds with good air movement no crackles no wheezes  Discharge Instructions   Discharge Instructions    Call MD for:  persistant nausea and vomiting   Complete by: As directed    Call MD for:  temperature >100.4   Complete by: As directed    Diet - low  sodium heart healthy   Complete by: As directed    Discharge instructions   Complete by: As directed    Take medication as prescribed Soft diet and advance as tolerated Follow up with PCP 1-2 weeks for evaluation of symptoms. Recommend BMET to evaluate potassium level   Increase activity slowly   Complete by: As directed      Allergies as of 10/31/2019       Reactions   Penicillins Hives, Itching, Rash   Has patient had a PCN reaction causing immediate rash, facial/tongue/throat swelling, SOB or lightheadedness with hypotension: Yes Has patient had a PCN reaction causing severe rash involving mucus membranes or skin necrosis: yes Has patient had a PCN reaction that required hospitalization: unk Has patient had a PCN reaction occurring within the last 10 years: unk If all of the above answers are "NO", then may proceed with Cephalosporin use.      Medication List    TAKE these medications   amLODipine 10 MG tablet Commonly known as: NORVASC Take 10 mg by mouth daily.   ciprofloxacin 500 MG tablet Commonly known as: CIPRO Take 1 tablet (500 mg total) by mouth 2 (two) times daily for 4 days.   HYDROcodone-acetaminophen 7.5-325 MG tablet Commonly known as: NORCO Take 1 tablet by mouth 4 (four) times daily as needed for pain.   metroNIDAZOLE 500 MG tablet Commonly known as: FLAGYL Take 1 tablet (500 mg total) by mouth every 8 (eight) hours for 4 days.   rosuvastatin 20 MG tablet Commonly known as: CRESTOR Take 20 mg by mouth daily.      Allergies  Allergen Reactions  . Penicillins Hives, Itching and Rash    Has patient had a PCN reaction causing immediate rash, facial/tongue/throat swelling, SOB or lightheadedness with hypotension: Yes Has patient had a PCN reaction causing severe rash involving mucus membranes or skin necrosis: yes Has patient had a PCN reaction that required hospitalization: unk Has patient had a PCN reaction occurring within the last 10 years: unk If all of the above answers are "NO", then may proceed with Cephalosporin use.       The results of significant diagnostics from this hospitalization (including imaging, microbiology, ancillary and laboratory) are listed below for reference.    Significant Diagnostic Studies: DG Abd 1 View  Result Date: 10/29/2019 CLINICAL DATA:  Abdominal pain EXAM: ABDOMEN -  1 VIEW COMPARISON:  CT from 10/27/2019 FINDINGS: Scattered large and small bowel gas is noted. Mild retained fecal material is seen without obstructive change. No free air is noted. No acute bony abnormality is noted. IMPRESSION: Mild retained fecal material without obstructive change. Electronically Signed   By: Inez Catalina M.D.   On: 10/29/2019 19:17   CT ABDOMEN PELVIS W CONTRAST  Result Date: 10/27/2019 CLINICAL DATA:  Abdominal pain. Status post cholecystectomy. Epigastric pain. Syncope. Hypertension. Three weeks postop. Recently had gallbladder surgery. ERCP and sphincterotomy 09/07/2019. Pancreatic stent placement 09/07/2019. Cholecystectomy 09/05/2019. EXAM: CT ABDOMEN AND PELVIS WITH CONTRAST TECHNIQUE: Multidetector CT imaging of the abdomen and pelvis was performed using the standard protocol following bolus administration of intravenous contrast. CONTRAST:  23mL OMNIPAQUE IOHEXOL 300 MG/ML  SOLN COMPARISON:  09/09/2014 FINDINGS: Lower chest: Clear lung bases. Normal heart size without pericardial or pleural effusion. Right coronary artery atherosclerosis. Hepatobiliary: Central right hepatic lobe 2.6 cm cyst. Other smaller hepatic lesions are too small to characterize, but likely cysts. Cholecystectomy. Resolution or drainage of previously described fluid collection in the operative bed. Pneumobilia which  is presumably iatrogenic. Intrahepatic ducts mildly dilated for prior cholecystectomy state, slightly increased. The common duct measures 11 mm on 32/9 coronal. Similar to the prior exam. Pancreas: Normal pancreas for age. No duct dilatation or acute inflammation. Spleen: Normal in size, without focal abnormality. Adrenals/Urinary Tract: Normal adrenal glands. Bilateral renal cysts. No hydronephrosis. Normal urinary bladder. Stomach/Bowel: Normal stomach, without wall thickening. The colon is underdistended. The right-side of the colon is thick walled with mild mucosal hyperenhancement, including  on 40/3. Terminal ileum and appendix are difficult to delineate, but felt to be grossly normal. Normal small bowel caliber. Vascular/Lymphatic: Advanced aortic and branch vessel atherosclerosis. No abdominopelvic adenopathy. Reproductive: Uterus and adnexa not well evaluated. Prominent left greater than right gonadal veins. Other: Resolution or drainage of previously described pelvic cul-de-sac fluid collections. Exam degraded by paucity of intraabdominal/pelvic fat. Musculoskeletal: Left hip osteoarthritis.  Lumbosacral spondylosis. IMPRESSION: 1. Mildly degraded exam secondary to paucity of abdominopelvic fat. 2. Right-sided colitis.  Distribution favors infection. 3. Cholecystectomy with resolution of fluid collections in the operative bed. Mild biliary duct dilatation is chronic. Consider correlation with bilirubin level. 4. 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 5. Coronary artery atherosclerosis. Aortic Atherosclerosis (ICD10-I70.0). Electronically Signed   By: Jeronimo Greaves M.D.   On: 10/27/2019 18:35    Microbiology: Recent Results (from the past 240 hour(s))  Culture, blood (routine x 2)     Status: None (Preliminary result)   Collection Time: 10/27/19 12:00 PM   Specimen: BLOOD  Result Value Ref Range Status   Specimen Description BLOOD RIGHT ANTECUBITAL  Final   Special Requests   Final    BOTTLES DRAWN AEROBIC AND ANAEROBIC Blood Culture results may not be optimal due to an inadequate volume of blood received in culture bottles   Culture   Final    NO GROWTH 3 DAYS Performed at [facility], [address], [place], [place] [address]    Report Status PENDING  Incomplete  Culture, blood (routine x 2)     Status: None (Preliminary result)   Collection Time: 10/27/19  4:13 PM   Specimen: Site Not Specified; Blood  Result Value Ref Range Status   Specimen Description SITE NOT SPECIFIED  Final   Special Requests   Final    BOTTLES DRAWN AEROBIC  ONLY Blood Culture results may not be optimal due to an inadequate volume of blood received in culture bottles   Culture   Final    NO GROWTH 3 DAYS Performed at [facility], [address] [place], [place] [address]    Report Status PENDING  Incomplete  SARS CORONAVIRUS 2 (TAT 6-24 HRS) Nasopharyngeal Nasopharyngeal Swab     Status: None   Collection Time: 10/27/19  7:04 PM   Specimen: Nasopharyngeal Swab  Result Value Ref Range Status   SARS Coronavirus 2 NEGATIVE NEGATIVE Final    Comment: (NOTE) SARS-CoV-2 target nucleic acids are NOT DETECTED. The SARS-CoV-2 RNA is generally detectable in upper and lower respiratory specimens during the acute phase of infection. Negative results do not preclude SARS-CoV-2 infection, do not rule out co-infections with other pathogens, and should not be used as the sole basis for treatment or other patient management decisions. Negative results must be combined with clinical observations, patient history, and epidemiological information. The expected result is Negative. Fact Sheet for Patients: HairSlick.no Fact Sheet for Healthcare Providers: quierodirigir.com This test is not yet approved or cleared by the [place] FDA and  has been authorized for detection and/or diagnosis of SARS-CoV-2 by FDA under an Emergency Use Authorization (EUA). This EUA will remain  in effect (meaning this test can be used) for the duration of the COVID-19 declaration under Section 56 4(b)(1) of the Act, 21 U.S.C. section 360bbb-3(b)(1), unless the authorization is terminated or revoked sooner. Performed at [facility], [address] [place], [place] [address]      Labs: Basic Metabolic Panel: Recent Labs  Lab 10/27/19 1532 10/28/19 0556 10/28/19 1518 10/29/19 0247 10/29/19 1843 10/30/19 0151 10/31/19 0525  NA  --  142 144 142 140 143 142  K  --  2.7* 3.6 2.6* 3.2* 2.9* 4.2  CL   --  106 110 106 106 108 110  CO2  --  24 24 26 24 26 24   GLUCOSE  --  80 75 78 83 86 85  BUN  --  8 7* <5* <5* <5* <5*  CREATININE  --  0.84 0.92 0.76 0.74 0.76 0.82  CALCIUM  --  8.6* 8.8* 8.8* 8.8* 8.8* 9.0  MG 2.1 2.5*  --   --   --   --  1.7   Liver Function Tests: Recent Labs  Lab 10/27/19 1215 10/28/19 0556  AST 25 24  ALT 11 10  ALKPHOS 73 71  BILITOT 0.6 0.7  PROT 6.2* 5.9*  ALBUMIN 2.8* 2.8*   Recent Labs  Lab 10/27/19 1215  LIPASE 23   No results for input(s): AMMONIA in the last 168 hours. CBC: Recent Labs  Lab 10/27/19 1215 10/28/19 0030 10/28/19 0556 10/29/19 0247 10/30/19 0151  WBC 5.8 6.9 7.3 5.8 5.9  NEUTROABS 4.6  --  5.6  --   --   HGB 11.3* 11.2* 10.0* 10.0* 9.3*  HCT 35.5* 33.7* 30.3* 29.8* 28.4*  MCV 99.4 96.0 97.1 95.8 97.6  PLT 284 259 255 221 237   Cardiac Enzymes: No results for input(s): CKTOTAL, CKMB, CKMBINDEX, TROPONINI in the last 168 hours. BNP: BNP (last 3 results) No results for input(s): BNP in the last 8760 hours.  ProBNP (last 3 results) No results for input(s): PROBNP in the last 8760 hours.  CBG: Recent Labs  Lab 10/27/19 1140 10/27/19 1148  GLUCAP 130* 111*       Signed:  10/29/19 NP  [facility] 10/31/2019, 9:27 AM

## 2019-10-31 NOTE — Care Management Important Message (Signed)
Important Message  Patient Details  Name: Samantha Gonzales MRN: [id number] Date of Birth: [birth year]   Medicare Important Message Given:  Yes     Mardene Sayer 10/31/2019, 3:30 PM   Patient  Has Signed  IM

## 2019-11-01 LAB — CULTURE, BLOOD (ROUTINE X 2)
Culture: NO GROWTH
Culture: NO GROWTH

## 2019-12-29 ENCOUNTER — Emergency Department (HOSPITAL_COMMUNITY): Payer: Medicare PPO

## 2019-12-29 ENCOUNTER — Inpatient Hospital Stay (HOSPITAL_COMMUNITY): Payer: Medicare PPO

## 2019-12-29 ENCOUNTER — Other Ambulatory Visit: Payer: Self-pay

## 2019-12-29 ENCOUNTER — Inpatient Hospital Stay (HOSPITAL_COMMUNITY)
Admission: EM | Admit: 2019-12-29 | Discharge: 2020-01-03 | DRG: 390 | Disposition: A | Discharge status: Home Health | Payer: Medicare PPO | Attending: Family Medicine | Admitting: Family Medicine

## 2019-12-29 ENCOUNTER — Inpatient Hospital Stay: Payer: Self-pay

## 2019-12-29 ENCOUNTER — Encounter (HOSPITAL_COMMUNITY): Payer: Self-pay | Admitting: *Deleted

## 2019-12-29 DIAGNOSIS — E440 Moderate protein-calorie malnutrition: Secondary | ICD-10-CM | POA: Diagnosis not present

## 2019-12-29 DIAGNOSIS — R739 Hyperglycemia, unspecified: Secondary | ICD-10-CM | POA: Diagnosis present

## 2019-12-29 DIAGNOSIS — N281 Cyst of kidney, acquired: Secondary | ICD-10-CM | POA: Diagnosis present

## 2019-12-29 DIAGNOSIS — Z20822 Contact with and (suspected) exposure to covid-19: Secondary | ICD-10-CM | POA: Diagnosis present

## 2019-12-29 DIAGNOSIS — Z79899 Other long term (current) drug therapy: Secondary | ICD-10-CM

## 2019-12-29 DIAGNOSIS — Z880 Allergy status to penicillin: Secondary | ICD-10-CM | POA: Diagnosis not present

## 2019-12-29 DIAGNOSIS — Z720 Tobacco use: Secondary | ICD-10-CM | POA: Diagnosis not present

## 2019-12-29 DIAGNOSIS — K56609 Unspecified intestinal obstruction, unspecified as to partial versus complete obstruction: Secondary | ICD-10-CM | POA: Diagnosis present

## 2019-12-29 DIAGNOSIS — R000 Tachycardia, unspecified: Secondary | ICD-10-CM | POA: Diagnosis present

## 2019-12-29 DIAGNOSIS — K5900 Constipation, unspecified: Secondary | ICD-10-CM | POA: Diagnosis present

## 2019-12-29 DIAGNOSIS — E876 Hypokalemia: Secondary | ICD-10-CM | POA: Diagnosis present

## 2019-12-29 DIAGNOSIS — Z9049 Acquired absence of other specified parts of digestive tract: Secondary | ICD-10-CM | POA: Diagnosis not present

## 2019-12-29 DIAGNOSIS — F0390 Unspecified dementia without behavioral disturbance: Secondary | ICD-10-CM | POA: Diagnosis present

## 2019-12-29 DIAGNOSIS — K565 Intestinal adhesions [bands], unspecified as to partial versus complete obstruction: Principal | ICD-10-CM | POA: Diagnosis present

## 2019-12-29 DIAGNOSIS — G8929 Other chronic pain: Secondary | ICD-10-CM | POA: Diagnosis present

## 2019-12-29 DIAGNOSIS — I10 Essential (primary) hypertension: Secondary | ICD-10-CM | POA: Diagnosis present

## 2019-12-29 DIAGNOSIS — E785 Hyperlipidemia, unspecified: Secondary | ICD-10-CM | POA: Diagnosis present

## 2019-12-29 DIAGNOSIS — Z0189 Encounter for other specified special examinations: Secondary | ICD-10-CM

## 2019-12-29 DIAGNOSIS — Z9851 Tubal ligation status: Secondary | ICD-10-CM | POA: Diagnosis not present

## 2019-12-29 DIAGNOSIS — I340 Nonrheumatic mitral (valve) insufficiency: Secondary | ICD-10-CM | POA: Diagnosis present

## 2019-12-29 DIAGNOSIS — R011 Cardiac murmur, unspecified: Secondary | ICD-10-CM | POA: Diagnosis present

## 2019-12-29 LAB — URINALYSIS, ROUTINE W REFLEX MICROSCOPIC
Bilirubin Urine: NEGATIVE
Glucose, UA: NEGATIVE mg/dL
Hgb urine dipstick: NEGATIVE
Ketones, ur: NEGATIVE mg/dL
Leukocytes,Ua: NEGATIVE
Nitrite: NEGATIVE
Protein, ur: 100 mg/dL — AB
Specific Gravity, Urine: 1.043 — ABNORMAL HIGH (ref 1.005–1.030)
pH: 5 (ref 5.0–8.0)

## 2019-12-29 LAB — COMPREHENSIVE METABOLIC PANEL
ALT: 12 U/L (ref 0–44)
AST: 26 U/L (ref 15–41)
Albumin: 3.8 g/dL (ref 3.5–5.0)
Alkaline Phosphatase: 56 U/L (ref 38–126)
Anion gap: 17 — ABNORMAL HIGH (ref 5–15)
BUN: 25 mg/dL — ABNORMAL HIGH (ref 8–23)
CO2: 25 mmol/L (ref 22–32)
Calcium: 9.9 mg/dL (ref 8.9–10.3)
Chloride: 93 mmol/L — ABNORMAL LOW (ref 98–111)
Creatinine, Ser: 0.87 mg/dL (ref 0.44–1.00)
GFR calc Af Amer: >60 mL/min (ref >60)
GFR calc non Af Amer: >60 mL/min (ref >60)
Glucose, Bld: 132 mg/dL — ABNORMAL HIGH (ref 70–99)
Potassium: 2.6 mmol/L — CL (ref 3.5–5.1)
Sodium: 135 mmol/L (ref 135–145)
Total Bilirubin: 0.9 mg/dL (ref 0.3–1.2)
Total Protein: 7.3 g/dL (ref 6.5–8.1)

## 2019-12-29 LAB — LIPASE, BLOOD: Lipase: 16 U/L (ref 11–51)

## 2019-12-29 LAB — CBC
HCT: 40.3 % (ref 36.0–46.0)
Hemoglobin: 13.9 g/dL (ref 12.0–15.0)
MCH: 34.8 pg — ABNORMAL HIGH (ref 26.0–34.0)
MCHC: 34.5 g/dL (ref 30.0–36.0)
MCV: 100.8 fL — ABNORMAL HIGH (ref 80.0–100.0)
Platelets: 319 10*3/uL (ref 150–400)
RBC: 4 MIL/uL (ref 3.87–5.11)
RDW: 14.6 % (ref 11.5–15.5)
WBC: 14.7 10*3/uL — ABNORMAL HIGH (ref 4.0–10.5)
nRBC: 0 % (ref 0.0–0.2)

## 2019-12-29 LAB — TROPONIN I (HIGH SENSITIVITY): Troponin I (High Sensitivity): 54 ng/L — ABNORMAL HIGH (ref <18)

## 2019-12-29 LAB — MAGNESIUM: Magnesium: 2.1 mg/dL (ref 1.7–2.4)

## 2019-12-29 LAB — SARS CORONAVIRUS 2 (TAT 6-24 HRS): SARS Coronavirus 2: NEGATIVE

## 2019-12-29 MED ORDER — SODIUM CHLORIDE 0.9 % IV BOLUS
1000.0000 mL | Freq: Once | INTRAVENOUS | Status: AC
  Administered 2019-12-29: 1000 mL via INTRAVENOUS

## 2019-12-29 MED ORDER — POTASSIUM CHLORIDE 10 MEQ/100ML IV SOLN
10.0000 meq | Freq: Once | INTRAVENOUS | Status: AC
  Administered 2019-12-29: 10 meq via INTRAVENOUS
  Filled 2019-12-29: qty 100

## 2019-12-29 MED ORDER — ONDANSETRON HCL 4 MG/2ML IJ SOLN
4.0000 mg | Freq: Once | INTRAMUSCULAR | Status: AC
  Administered 2019-12-29: 4 mg via INTRAVENOUS
  Filled 2019-12-29: qty 2

## 2019-12-29 MED ORDER — SODIUM CHLORIDE 0.9% FLUSH
3.0000 mL | Freq: Once | INTRAVENOUS | Status: AC
  Administered 2019-12-31: 3 mL via INTRAVENOUS

## 2019-12-29 MED ORDER — SODIUM CHLORIDE 0.9 % IV SOLN: INTRAVENOUS | Status: DC

## 2019-12-29 MED ORDER — POTASSIUM CHLORIDE CRYS ER 20 MEQ PO TBCR
40.0000 meq | EXTENDED_RELEASE_TABLET | Freq: Once | ORAL | Status: DC
  Filled 2019-12-29: qty 2

## 2019-12-29 MED ORDER — CHLORHEXIDINE GLUCONATE CLOTH 2 % EX PADS
6.0000 | MEDICATED_PAD | Freq: Every day | CUTANEOUS | Status: DC
  Administered 2019-12-30 – 2020-01-02 (×4): 6 via TOPICAL

## 2019-12-29 MED ORDER — DIATRIZOATE MEGLUMINE & SODIUM 66-10 % PO SOLN
90.0000 mL | Freq: Once | ORAL | Status: DC
  Filled 2019-12-29: qty 90

## 2019-12-29 MED ORDER — MORPHINE SULFATE (PF) 2 MG/ML IV SOLN
2.0000 mg | INTRAVENOUS | Status: DC | PRN
  Administered 2019-12-29 – 2019-12-30 (×6): 2 mg via INTRAVENOUS
  Filled 2019-12-29 (×7): qty 1

## 2019-12-29 MED ORDER — SODIUM CHLORIDE 0.9% FLUSH
10.0000 mL | Freq: Two times a day (BID) | INTRAVENOUS | Status: DC
  Administered 2019-12-30 – 2020-01-02 (×4): 10 mL

## 2019-12-29 MED ORDER — POTASSIUM CHLORIDE 10 MEQ/100ML IV SOLN
10.0000 meq | INTRAVENOUS | Status: AC
  Administered 2019-12-29 (×3): 10 meq via INTRAVENOUS
  Filled 2019-12-29 (×3): qty 100

## 2019-12-29 MED ORDER — MORPHINE SULFATE (PF) 4 MG/ML IV SOLN
4.0000 mg | Freq: Once | INTRAVENOUS | Status: AC
  Administered 2019-12-29: 4 mg via INTRAVENOUS
  Filled 2019-12-29: qty 1

## 2019-12-29 MED ORDER — HYDROMORPHONE HCL 1 MG/ML IJ SOLN
1.0000 mg | Freq: Once | INTRAMUSCULAR | Status: AC
  Administered 2019-12-29: 1 mg via INTRAMUSCULAR
  Filled 2019-12-29: qty 1

## 2019-12-29 MED ORDER — SODIUM CHLORIDE 0.9% FLUSH: 10.0000 mL | INTRAVENOUS | Status: DC | PRN

## 2019-12-29 MED ORDER — IOHEXOL 300 MG/ML  SOLN
100.0000 mL | Freq: Once | INTRAMUSCULAR | Status: AC | PRN
  Administered 2019-12-29: 100 mL via INTRAVENOUS

## 2019-12-29 MED ORDER — MORPHINE SULFATE (PF) 2 MG/ML IV SOLN: 2.0000 mg | Freq: Once | INTRAVENOUS | Status: DC

## 2019-12-29 MED ORDER — HEPARIN SODIUM (PORCINE) 5000 UNIT/ML IJ SOLN
5000.0000 [IU] | Freq: Three times a day (TID) | INTRAMUSCULAR | Status: DC
  Administered 2019-12-29 – 2020-01-03 (×16): 5000 [IU] via SUBCUTANEOUS
  Filled 2019-12-29 (×16): qty 1

## 2019-12-29 NOTE — ED Provider Notes (Addendum)
[facility] EMERGENCY DEPARTMENT Provider Note   CSN: [id number] Arrival date & time: 12/29/19  4332     History Chief Complaint  Patient presents with  . Abdominal Pain    Samantha Gonzales is a 79 y.o. female.  The history is provided by the patient and medical records. No language interpreter was used.  Abdominal Pain     79 year old female with history of hypertension, hyperlipidemia, previously had a cholecystectomy on 10/27/2019 and underwent ERCP with stent placement presenting today complaining of abdominal pain.  Patient is a poor historian.  She report having recurrent abdominal pain for more than a year.  She had her gallbladder removed 2 months ago but states the pain never fully resolved.  She is unable to tell me when is her recent abdominal pain flare but states this began about a week.  Pain is sharp, achy, constant, which is waxing waning.  She denies having an appetite, felt nauseous, and occasional nonbloody nonbilious vomit.  Decreased bowel movement due to not eating much.  No urinary symptoms.  Able to pass flatus.  No complaints of chest pain shortness of breath or productive cough.  She tries over-the-counter Tylenol and aspirin at home without adequate relief.  Pain is across her lower abdomen.  Past Medical History:  Diagnosis Date  . Hyperlipidemia   . Hypertension   . Syncope 10/27/2019    Patient Active Problem List   Diagnosis Date Noted  . Pressure injury of skin 10/29/2019  . Colitis 10/28/2019  . AKI (acute kidney injury) (HCC) 10/27/2019  . Syncope 10/27/2019  . Hypokalemia 10/27/2019  . Essential hypertension 10/27/2019  . Malnutrition of moderate degree 09/07/2019  . Cholecystitis 09/04/2019  . Altered mental status 01/18/2018  . Altered mental state 01/17/2018    Past Surgical History:  Procedure Laterality Date  . CHOLECYSTECTOMY  09/05/2019  . CHOLECYSTECTOMY N/A 09/05/2019   Procedure: LAPAROSCOPIC CHOLECYSTECTOMY  WITH INTRAOPERATIVE CHOLANGIOGRAM;  Surgeon: Harriette Bouillon, MD;  Location: [facility] OR;  Service: General;  Laterality: N/A;  . ERCP N/A 09/07/2019   Procedure: ENDOSCOPIC RETROGRADE CHOLANGIOPANCREATOGRAPHY (ERCP);  Surgeon: Vida Rigger, MD;  Location: [facility] ENDOSCOPY;  Service: Endoscopy;  Laterality: N/A;  . PANCREATIC STENT PLACEMENT  09/07/2019   Procedure: PANCREATIC STENT PLACEMENT;  Surgeon: Vida Rigger, MD;  Location: [facility] ENDOSCOPY;  Service: Endoscopy;;  . REMOVAL OF STONES  09/07/2019   Procedure: REMOVAL OF STONES;  Surgeon: Vida Rigger, MD;  Location: [facility] ENDOSCOPY;  Service: Endoscopy;;  . Dennison Mascot  09/07/2019   Procedure: Dennison Mascot;  Surgeon: Vida Rigger, MD;  Location: [facility] ENDOSCOPY;  Service: Endoscopy;;  . TUBAL LIGATION       OB History   No obstetric history on file.     Family History  Problem Relation Age of Onset  . CAD Neg Hx     Social History   Tobacco Use  . Smoking status: Never Smoker  . Smokeless tobacco: Current User    Types: Chew  Substance Use Topics  . Alcohol use: No  . Drug use: No    Home Medications Prior to Admission medications   Medication Sig Start Date End Date Taking? Authorizing Provider  amLODipine (NORVASC) 10 MG tablet Take 10 mg by mouth daily. 09/25/19   [provider]  HYDROcodone-acetaminophen (NORCO) 7.5-325 MG tablet Take 1 tablet by mouth 4 (four) times daily as needed for pain. 09/27/19   [provider]  rosuvastatin (CRESTOR) 20 MG tablet Take 20 mg by mouth daily.  07/30/18   [provider]    Allergies    Penicillins  Review of Systems   Review of Systems  Gastrointestinal: Positive for abdominal pain.  All other systems reviewed and are negative.   Physical Exam Updated Vital Signs BP (!) 153/81 (BP Location: Right Arm)   Pulse 94   Temp 98 F (36.7 C) (Oral)   Resp 18   Wt 53.5 kg   SpO2 98%   BMI 16.00 kg/m   Physical Exam Vitals and nursing note reviewed.    Constitutional:      General: She is not in acute distress.    Appearance: She is well-developed.     Comments: Elderly female in no acute discomfort.  HENT:     Head: Atraumatic.  Eyes:     Conjunctiva/sclera: Conjunctivae normal.  Cardiovascular:     Rate and Rhythm: Normal rate and regular rhythm.     Heart sounds: Murmur present.  Pulmonary:     Breath sounds: Normal breath sounds.  Abdominal:     General: Abdomen is flat.     Palpations: Abdomen is soft.     Tenderness: There is generalized abdominal tenderness (Mild diffuse abdominal tenderness without focal point tenderness.  No hernia noted.).  Musculoskeletal:     Cervical back: Neck supple.  Skin:    Findings: No rash.  Neurological:     Mental Status: She is alert.     ED Results / Procedures / Treatments   Labs (all labs ordered are listed, but only abnormal results are displayed) Labs Reviewed  COMPREHENSIVE METABOLIC PANEL - Abnormal; Notable for the following components:      Result Value   Potassium 2.6 (*)    Chloride 93 (*)    Glucose, Bld 132 (*)    BUN 25 (*)    Anion gap 17 (*)    All other components within normal limits  CBC - Abnormal; Notable for the following components:   WBC 14.7 (*)    MCV 100.8 (*)    MCH 34.8 (*)    All other components within normal limits  URINALYSIS, ROUTINE W REFLEX MICROSCOPIC - Abnormal; Notable for the following components:   Specific Gravity, Urine 1.043 (*)    Protein, ur 100 (*)    Bacteria, UA RARE (*)    All other components within normal limits  SARS CORONAVIRUS 2 (TAT 6-24 HRS)  LIPASE, BLOOD  MAGNESIUM    EKG EKG Interpretation  Date/Time:  Thursday December 29 2019 11:56:22 EST Ventricular Rate:  75 PR Interval:    QRS Duration: 74 QT Interval:  447 QTC Calculation: 500 R Axis:   70 Text Interpretation: Sinus rhythm Anteroseptal infarct, possibly acute Confirmed by Benjiman Core [phone]) on 12/29/2019 11:59:50 AM   Radiology CT ABDOMEN  PELVIS W CONTRAST  Result Date: 12/29/2019 CLINICAL DATA:  Abdominal pain for 2 weeks with vomiting. History of gallbladder surgery 2 months ago EXAM: CT ABDOMEN AND PELVIS WITH CONTRAST TECHNIQUE: Multidetector CT imaging of the abdomen and pelvis was performed using the standard protocol following bolus administration of intravenous contrast. CONTRAST:  OMNIPAQUE IOHEXOL 300 MG/ML  SOLN COMPARISON:  10/27/2019 FINDINGS: Lower chest: Patchy nodular pattern in the right middle lobe is new compared to previous studies. No signs of pleural effusion. Hepatobiliary: No signs of suspicious focal liver lesion. Cyst in the right hepatic lobe is unchanged. Other small low-density foci and focal fat intensive occasion about the gallbladder fossa are stable findings. The portal vein  is patent. Post cholecystectomy with pneumobilia and biliary ductal dilation, duct is dilated to approximately 12 mm previously approximately 9 mm on 10/27/2019. The amount of pneumobilia is increased in the setting of a small bowel obstruction, see below. Pancreas: Mild increase in pancreatic ductal distension since the previous study but with 3 mm dilation of main pancreatic duct. No signs of peripancreatic inflammation. Spleen: Spleen is normal in size and without focal lesion. Adrenals/Urinary Tract: Adrenal glands are normal. Signs of renal cysts bilaterally. No signs of hydronephrosis. Urinary bladder is normal. Stomach/Bowel: Diffuse small bowel dilation including dilation of duodenum extending back into the stomach which is markedly dilated. Bowel loops are dilated to the upper central pelvis where there is a transition point and complete decompression of bowel loops beyond the transition point. Suspected area of transition (image 69, series 3) also seen on coronal images 96 through 88 where there is abrupt narrowing of the small bowel loops in the pelvis. This is likely proximal to mid ileum. The appendix is normal. Stool filling  the colon partially which is mainly under distended. Vascular/Lymphatic: Extensive atherosclerotic changes throughout the abdominal aorta, no signs of aneurysm. Vascular atherosclerotic changes track into the iliac vessels. No signs of retroperitoneal adenopathy. No upper abdominal lymphadenopathy. No signs of pelvic adenopathy. Reproductive: Uterus grossly normal. Bowel loops which are dilated surround the uterus and adnexa. No signs of adnexal mass. Other: No signs of free air. No signs of pneumatosis. No visible hernia. Musculoskeletal: Spinal degenerative changes without acute or destructive bone process. IMPRESSION: 1. High-grade small bowel obstruction with complete decompression of distal ileum and near complete decompression of the colon, transition point in the central pelvis likely due to adhesions. 2. Post cholecystectomy with pneumobilia and biliary ductal dilation, correlate with any signs of cholangitis though given recent sphincterotomy and marked bowel distension findings could be explained in the setting of small bowel obstruction. 3. Mild increase in pancreatic ductal distension since the previous study, attention on follow-up. 4. New patchy nodular pattern in the right middle lobe. Findings may represent pneumonitis, perhaps from aspiration. Correlate with nausea and vomiting. 5. Extensive atherosclerotic changes of the abdominal aorta. 6. Bilateral renal cysts. Aortic Atherosclerosis (ICD10-I70.0). Electronically Signed   By: Zetta Bills M.D.   On: 12/29/2019 09:55    Procedures Procedures (including critical care time)  Medications Ordered in ED Medications  sodium chloride flush (NS) 0.9 % injection 3 mL (3 mLs Intravenous Not Given 12/29/19 0839)  morphine 4 MG/ML injection 4 mg (4 mg Intravenous Given 12/29/19 0833)  ondansetron (ZOFRAN) injection 4 mg (4 mg Intravenous Given 12/29/19 0838)  sodium chloride 0.9 % bolus 1,000 mL (0 mLs Intravenous Paused 12/29/19 1029)  potassium  chloride 10 mEq in 100 mL IVPB (0 mEq Intravenous Paused 12/29/19 1029)  potassium chloride SA (KLOR-CON) CR tablet 40 mEq (40 mEq Oral Given 12/29/19 0840)  iohexol (OMNIPAQUE) 300 MG/ML solution 100 mL (100 mLs Intravenous Contrast Given 12/29/19 0938)  HYDROmorphone (DILAUDID) injection 1 mg (1 mg Intramuscular Given 12/29/19 1008)    ED Course  I have reviewed the triage vital signs and the nursing notes.  Pertinent labs & imaging results that were available during my care of the patient were reviewed by me and considered in my medical decision making (see chart for details).    MDM Rules/Calculators/A&P                      BP (!) 186/75   Pulse Marland Kitchen)  114   Temp 98 F (36.7 C) (Oral)   Resp 12   Wt 53.5 kg   SpO2 98%   BMI 16.00 kg/m   Final Clinical Impression(s) / ED Diagnoses Final diagnoses:  SBO (small bowel obstruction) (HCC)  Hypokalemia    Rx / DC Orders ED Discharge Orders    None     7:22 AM Patient with recurrent abdominal pain, has had cholecystectomy 2 months ago but states pain never fully resolved.  She is requesting for pain medication.  She reports she has not been eating drinking much. Sts she's able to pass flatus but no BM x 2 days.  Today her labs remarkable for an elevated WBC of 14.7.  She also has hypokalemia with a potassium of 2.6.  Will obtain abdominal pelvic CT scan for further evaluation.  UA is currently pending.  We will give potassium supplementation.  Pain medication, antinausea medication, and IV fluid given.   10:56 AM CT scan of the abdomen pelvis demonstrate high-grade small bowel structure with complete decompressions of the distal ileum and near complete complete decompressions of the colon.  Residual port in the central pelvis likely due to adhesion.  Patient is not actively vomiting.  Will consult general surgery as well as medicine for further management. NG tube ordered.  11:15 AM Appreciate consultation from [facility] who  agrees to see pt and request medicine for admission. covid-19 screening test ordered.  11:49 AM Appreciate consultation from [facility] Medicine resident who agrees to see and admit pt.   Ekg Obtained due to hypokalemia.  There's changes indicating anteroseptal changes, possibly acute.  Pt without CP.  Trop ordered.  Samantha Gonzales was evaluated in Emergency Department on 12/29/2019 for the symptoms described in the history of present illness. She was evaluated in the context of the global COVID-19 pandemic, which necessitated consideration that the patient might be at risk for infection with the SARS-CoV-2 virus that causes COVID-19. Institutional protocols and algorithms that pertain to the evaluation of patients at risk for COVID-19 are in a state of rapid change based on information released by regulatory bodies including the CDC and federal and state organizations. These policies and algorithms were followed during the patient's care in the ED.    Fayrene Helper, PA-C 12/29/19 1150    Fayrene Helper, PA-C 12/30/19 1521    Benjiman Core, MD 12/31/19 1455

## 2019-12-29 NOTE — ED Notes (Signed)
This RN informed that pt had pulled NG tube out while RN taking another pt up to the floor. Family medicine made aware of this.

## 2019-12-29 NOTE — ED Notes (Signed)
Please call daughter Sharronda Schweers @ [phone]--for a status update--Samantha Gonzales

## 2019-12-29 NOTE — H&P (Addendum)
Family Medicine Teaching [facility] Admission History and Physical Service Pager: [phone]  Patient name: Samantha Gonzales Medical record number: [id number] Date of birth: 04/30/1941 Age: 79 y.o. Gender: female  Primary Care Provider: System, Pcp Not In Consultants: general surgery Code Status: full Preferred Emergency Contact: vita currin, daughter, [phone]  Chief Complaint: Abdominal pain  Assessment and Plan: Samantha Gonzales is a 79 y.o. female presenting with 4-week history of progressive abdominal pain.  CT scan consistent with small bowel obstruction  Small bowel obstruction Likely secondary to adhesions, unclear if due to recent cholecystectomy/ERCP or other process.  General surgery consulted and helping manage.  Order for NG tube and Gastrografin has been placed.  To control pain, morphine 2 mg as needed.  N.p.o., on maintenance rate IV fluids at 100.  Morphine for pain control -Admit to inpatient family medicine, appropriate for telemetry floor, Dr. Lloyd Huger -Vital signs per floor routine -Morphine 2 mg every 4 hours pain control -NG tube and management per surgery -Heparin 5000 units subcu 3 times daily for DVT prophylaxis  Hypokalemia K2.6.  Likely secondary to p.o. failure.  Received 1 10 mEQ KCl in ED.  Will add on 6 more runs of this dose.  Recheck BMP at 1600.  Takes amlodipine 10 mg at home.  Will hold this while n.p.o.  Can consider adding on as needed BP meds if needed, but given her abdominal pain likely source  Questionable murmur/EKG findings EKG read by machine as possible acute anteroseptal infarct.  Manual read not consistent with this.  ED added on troponins, so we will follow these up just to rule out.  Patient states that she had a history of a murmur, appreciate on exam.  Will add on echo as the last one was done back in 2019.  Hyperlipidemia -Takes Crestor 20 mg at home.  Will hold while n.p.o., restart when taking an oral  meds  Hypertension/Tachycardia History of hypertension.  Systolic BP in the 160s during exam. Tachycardic into the 90-110 range during exam. Likely 2/2 abdominal pain. Can consider adding on prn medications if necessary.  Hyperglycemia CBG 132.  No history of diabetes.  Last A1c normal a few years ago.  Likely secondary to stress response from being n.p.o. and pain.  Can follow with subsequent BMPs, if persistently elevated can think about getting A1c.  PMH is significant for htn, hld, h/o cholecystectomy  FEN/GI: npo Prophylaxis: heparin sub q  Disposition: pending clinical course  History of Present Illness:  Samantha Gonzales is a 79 y.o. female presenting with diffuse abdominal pain.  Per patient report she has been having increasing abdominal pain since her laparoscopic cholecystectomy which occurred on 09/05/2019.  Indication for lap chole was acute cholecystitis.  Noted to have filling defect on cholangiogram, GI was consulted for ERCP which occurred on 09/07/2019.  It looks like the patient had a postoperative fluid collection, IR percutaneous drain was placed on 11/15.  This is apparently been removed in the interim, patient unsure when it was removed.  Was she has not had a bowel movement in 3 or 4 weeks.  Has been passing very minimal amount of flatus over this time.  Patient denies any vomiting or nausea, but did persistently belch during entirety of exam.  Her main complaint is diffuse abdominal pain which she describes as a severe bloating sensation.  Work-up in the ED consisted of a CMP, CBC, COVID-19 test, UA with high-power field, CT abdomen pelvis, and twelve-lead EKG.  Laboratory  work-up insignificant aside from potassium 2.6, white blood cell count 14.7, BUN 25.  CT abdomen/pelvis showing high-grade small bowel obstruction with transition point likely due to adhesions likely in her distal ileum.  Postcholecystectomy changes with accompanying pneumobilia, biliary duct dilatation.   Found to have patchy nodular pattern in the right middle lobe, possibly from aspiration.  Bilateral renal cyst. Covid test pending, UA normal, EKG read showing possible acute anteroseptal infarct but no chest pain or other symptoms.  Manual read not consistent with this.  Patient was given 1 L bolus IV fluids in the ED.  10 mEq KCl x1, 4 mg morphine x2.   Review Of Systems: Per HPI with the following additions:   Review of Systems  Constitutional: Negative for chills and fever.  Cardiovascular: Negative for chest pain, palpitations and claudication.  Gastrointestinal: Positive for abdominal pain, constipation and nausea. Negative for diarrhea and vomiting.  Genitourinary: Negative for dysuria and urgency.  Musculoskeletal: Negative for myalgias and neck pain.  Neurological: Negative for dizziness and headaches.    Patient Active Problem List   Diagnosis Date Noted  . Small bowel obstruction ([place]) 12/29/2019  . Pressure injury of skin 10/29/2019  . Colitis 10/28/2019  . AKI (acute kidney injury) ([place]) 10/27/2019  . Syncope 10/27/2019  . Hypokalemia 10/27/2019  . Essential hypertension 10/27/2019  . Malnutrition of moderate degree 09/07/2019  . Cholecystitis 09/04/2019  . Altered mental status 01/18/2018  . Altered mental state 01/17/2018    Past Medical History: Past Medical History:  Diagnosis Date  . Hyperlipidemia   . Hypertension   . Syncope 10/27/2019    Past Surgical History: Past Surgical History:  Procedure Laterality Date  . CHOLECYSTECTOMY  09/05/2019  . CHOLECYSTECTOMY N/A 09/05/2019   Procedure: LAPAROSCOPIC CHOLECYSTECTOMY WITH INTRAOPERATIVE CHOLANGIOGRAM;  Surgeon: Erroll Luna, MD;  Location: [place];  Service: General;  Laterality: N/A;  . ERCP N/A 09/07/2019   Procedure: ENDOSCOPIC RETROGRADE CHOLANGIOPANCREATOGRAPHY (ERCP);  Surgeon: Clarene Essex, MD;  Location: [place];  Service: Endoscopy;  Laterality: N/A;  . PANCREATIC STENT PLACEMENT   09/07/2019   Procedure: PANCREATIC STENT PLACEMENT;  Surgeon: Clarene Essex, MD;  Location: [place];  Service: Endoscopy;;  . REMOVAL OF STONES  09/07/2019   Procedure: REMOVAL OF STONES;  Surgeon: Clarene Essex, MD;  Location: [facility] ENDOSCOPY;  Service: Endoscopy;;  . [name]  09/07/2019   Procedure: [name];  Surgeon: Clarene Essex, MD;  Location: [facility] ENDOSCOPY;  Service: Endoscopy;;  . TUBAL LIGATION      Social History: Social History   Tobacco Use  . Smoking status: Never Smoker  . Smokeless tobacco: Current User    Types: Chew  Substance Use Topics  . Alcohol use: No  . Drug use: No   Additional social history:   Please also refer to relevant sections of EMR.  Family History: Family History  Problem Relation Age of Onset  . CAD Neg Hx     Allergies and Medications: Allergies  Allergen Reactions  . Penicillins Hives, Itching and Rash    Has patient had a PCN reaction causing immediate rash, facial/tongue/throat swelling, SOB or lightheadedness with hypotension: Yes Has patient had a PCN reaction causing severe rash involving mucus membranes or skin necrosis: yes Has patient had a PCN reaction that required hospitalization: unk Has patient had a PCN reaction occurring within the last 10 years: unk If all of the above answers are "NO", then may proceed with Cephalosporin use.    No current facility-administered medications on file prior  to encounter.   Current Outpatient Medications on File Prior to Encounter  Medication Sig Dispense Refill  . amLODipine (NORVASC) 10 MG tablet Take 10 mg by mouth daily.    Marland Kitchen HYDROcodone-acetaminophen (NORCO) 7.5-325 MG tablet Take 1 tablet by mouth 4 (four) times daily as needed for pain.    . rosuvastatin (CRESTOR) 20 MG tablet Take 20 mg by mouth daily.      Objective: BP (!) 176/81 (BP Location: Right Arm)   Pulse 80   Temp 98 F (36.7 C) (Oral)   Resp 16   Wt 53.5 kg   SpO2 99%   BMI 16.00 kg/m   Exam: General: Well-appearing African-American female, no acute distress ENTM: No cervical adenopathy Cardiovascular: 3 out of 6 holosystolic murmur, skin warm and dry, no rubs or gallops Respiratory: Lungs clear to auscultation bilaterally, no accessory muscle use Gastrointestinal: Distended, no guarding, mild tenderness to palpation entirety of abdomen MSK: 5/5 strength extension, flexion arms and legs Derm: Skin warm and dry Neuro: CN II through XII intact, no focal neurologic deficit Psych: Appropriate, pleasant  Labs and Imaging: CBC BMET  Recent Labs  Lab 12/29/19 0421  WBC 14.7*  HGB 13.9  HCT 40.3  PLT 319   Recent Labs  Lab 12/29/19 0421  NA 135  K 2.6*  CL 93*  CO2 25  BUN 25*  CREATININE 0.87  GLUCOSE 132*  CALCIUM 9.9     EKG: Possible anteroseptal infarct  Myrene Buddy, MD 12/29/2019, 12:41 PM PGY-3, [facility] FPTS Intern pager: [phone], text pages welcome

## 2019-12-29 NOTE — ED Notes (Signed)
purewick applied, pt repositioned and changed into gown and hooked up to monitor. Pt provided warm blankets and told we would be  Back in shortly to provide pain meds.

## 2019-12-29 NOTE — ED Notes (Signed)
Lab called  A 2.6 potassium

## 2019-12-29 NOTE — Progress Notes (Signed)
Patient ID: BLAKELYNN SCHEELER, female   DOB: 1941/03/27, 79 y.o.   MRN: [id number] Pt s/p CT A/P this am with subsequent extravasation of saline/contrast(approx 20-30 cc) into rt antecubital venous acess site. Area currently soft but edematous, NT, intact distal pulses and neuro fxn ok; rec cool compress ( can alternate with warm compresses as needed) to site and arm elevation as much as possible over next 24 hrs to promote adequate venous drainage; margins of affected area marked with pen; pt's nurse made aware; rec placing new IV in left arm; will monitor

## 2019-12-29 NOTE — Consult Note (Signed)
[facility] Surgery Consult Note  Samantha Gonzales 15-Jan-1941  [id number].    Requesting MD: Fayrene Helper PA Chief Complaint/Reason for Consult: SBO  HPI:  Samantha Gonzales is a 79yo female PMH HTN and HLD with prior h/o tubal ligation and laparoscopic cholecystectomy 09/05/2019, who presented to [facility] earlier today complaining of abdominal pain. States that she has had intermittent pain for about 4 weeks. She thinks that it has been present since her lap chole in November of last year, but the pain has gotten significantly worse over the last 2 days. Pain is central and associated with bloating. Denies nausea or vomiting but she has been burping. She cannot remember the last time she ate any real food. Last BM was a few weeks ago. She has passed gas intermittently, but has had none today.  ED workup included CT scan which shows high-grade small bowel obstruction with complete decompression of distal ileum and near complete decompression of the colon, transition point in the central pelvis likely due to adhesions. WBC 14.7, Cr 0.87, K 2.6. Patient is being admitted to the family medicine service. General surgery asked to see.  Review of Systems  Constitutional: Positive for malaise/fatigue. Negative for chills and fever.  HENT: Negative.   Eyes: Negative.   Respiratory: Negative.   Cardiovascular: Negative.   Gastrointestinal: Positive for abdominal pain and constipation. Negative for nausea and vomiting.  Genitourinary: Negative.   Musculoskeletal: Negative.   Skin: Negative.   Neurological: Negative.     All systems reviewed and otherwise negative except for as above  Family History  Problem Relation Age of Onset  . CAD Neg Hx     Past Medical History:  Diagnosis Date  . Hyperlipidemia   . Hypertension   . Syncope 10/27/2019    Past Surgical History:  Procedure Laterality Date  . CHOLECYSTECTOMY  09/05/2019  . CHOLECYSTECTOMY N/A 09/05/2019   Procedure: LAPAROSCOPIC  CHOLECYSTECTOMY WITH INTRAOPERATIVE CHOLANGIOGRAM;  Surgeon: Harriette Bouillon, MD;  Location: [facility] OR;  Service: General;  Laterality: N/A;  . ERCP N/A 09/07/2019   Procedure: ENDOSCOPIC RETROGRADE CHOLANGIOPANCREATOGRAPHY (ERCP);  Surgeon: Vida Rigger, MD;  Location: [facility] ENDOSCOPY;  Service: Endoscopy;  Laterality: N/A;  . PANCREATIC STENT PLACEMENT  09/07/2019   Procedure: PANCREATIC STENT PLACEMENT;  Surgeon: Vida Rigger, MD;  Location[facility] ENDOSCOPY;  Service: Endoscopy;;  . REMOVAL OF STONES  09/07/2019   Procedure: REMOVAL OF STONES;  Surgeon: Vida Rigger, MD;  Location: [facility] ENDOSCOPY;  Service: Endoscopy;;  . Dennison Mascot  09/07/2019   Procedure: Dennison Mascot;  Surgeon: Vida Rigger, MD;  Location: [facility] ENDOSCOPY;  Service: Endoscopy;;  . TUBAL LIGATION      Social History:  reports that she has never smoked. Her smokeless tobacco use includes chew. She reports that she does not drink alcohol or use drugs.  Allergies:  Allergies  Allergen Reactions  . Penicillins Hives, Itching and Rash    Has patient had a PCN reaction causing immediate rash, facial/tongue/throat swelling, SOB or lightheadedness with hypotension: Yes Has patient had a PCN reaction causing severe rash involving mucus membranes or skin necrosis: yes Has patient had a PCN reaction that required hospitalization: unk Has patient had a PCN reaction occurring within the last 10 years: unk If all of the above answers are "NO", then may proceed with Cephalosporin use.     (Not in a hospital admission)   Prior to Admission medications   Medication Sig Start Date End Date Taking? Authorizing Provider  amLODipine (NORVASC) 10 MG tablet  Take 10 mg by mouth daily. 09/25/19  Yes [provider]  HYDROcodone-acetaminophen (NORCO) 7.5-325 MG tablet Take 1 tablet by mouth 4 (four) times daily as needed for pain. 09/27/19  Yes [provider]  rosuvastatin (CRESTOR) 20 MG tablet Take 20 mg by mouth daily. 07/30/18   Yes [provider]    Blood pressure (!) 184/80, pulse (!) 117, temperature 98 F (36.7 C), temperature source Oral, resp. rate 13, weight 53.5 kg, SpO2 100 %. Physical Exam: General: pleasant, frail black female who is laying in bed in NAD HEENT: head is normocephalic, atraumatic.  Sclera are noninjected.  PERRL.  Ears and nose without any masses or lesions.  Mouth is pink and moist. Dentition fair Heart: regular, rate, and rhythm.  Normal s1,s2. No obvious murmurs, gallops, or rubs noted.  Palpable pedal pulses bilaterally  Lungs: CTAB, no wheezes, rhonchi, or rales noted.  Respiratory effort nonlabored Abd: soft, distended, hypoactive BS, mild central abdominal TTP without rebound or guarding, no masses, hernias, or organomegaly MS: no BUE/BLE edema, calves soft and nontender Skin: warm and dry with no masses, lesions, or rashes Psych: A&Ox4 with an appropriate affect Neuro: cranial nerves grossly intact, equal strength in BUE/BLE bilaterally, normal speech, thought process intact  Results for orders placed or performed during the hospital encounter of 12/29/19 (from the past 48 hour(s))  Lipase, blood     Status: None   Collection Time: 12/29/19  4:21 AM  Result Value Ref Range   Lipase 16 11 - 51 U/L    Comment: Performed at [facility], [address], [place], [place] [address]  Comprehensive metabolic panel     Status: Abnormal   Collection Time: 12/29/19  4:21 AM  Result Value Ref Range   Sodium 135 135 - 145 mmol/L   Potassium 2.6 (LL) 3.5 - 5.1 mmol/L    Comment: CRITICAL RESULT CALLED TO, READ BACK BY AND VERIFIED WITH: L.CRISCOE,RN 12/29/2019 0546 DAVISB    Chloride 93 (L) 98 - 111 mmol/L   CO2 25 22 - 32 mmol/L   Glucose, Bld 132 (H) 70 - 99 mg/dL    Comment: Glucose reference range applies only to samples taken after fasting for at least 8 hours.   BUN 25 (H) 8 - 23 mg/dL   Creatinine, Ser 0.87 0.44 - 1.00 mg/dL   Calcium 9.9 8.9 - 10.3 mg/dL   Total  Protein 7.3 6.5 - 8.1 g/dL   Albumin 3.8 3.5 - 5.0 g/dL   AST 26 15 - 41 U/L   ALT 12 0 - 44 U/L   Alkaline Phosphatase 56 38 - 126 U/L   Total Bilirubin 0.9 0.3 - 1.2 mg/dL   GFR calc non Af Amer >60 >60 mL/min   GFR calc Af Amer >60 >60 mL/min   Anion gap 17 (H) 5 - 15    Comment: Performed at [facility] [address], [place], [place] [address]  CBC     Status: Abnormal   Collection Time: 12/29/19  4:21 AM  Result Value Ref Range   WBC 14.7 (H) 4.0 - 10.5 K/uL   RBC 4.00 3.87 - 5.11 MIL/uL   Hemoglobin 13.9 12.0 - 15.0 g/dL   HCT 40.3 36.0 - 46.0 %   MCV 100.8 (H) 80.0 - 100.0 fL   MCH 34.8 (H) 26.0 - 34.0 pg   MCHC 34.5 30.0 - 36.0 g/dL   RDW 14.6 11.5 - 15.5 %   Platelets 319  150 - 400 K/uL   nRBC 0.0 0.0 - 0.2 %    Comment: Performed at [facility], [address] [place], [place] [address]  Magnesium     Status: None   Collection Time: 12/29/19  4:21 AM  Result Value Ref Range   Magnesium 2.1 1.7 - 2.4 mg/dL    Comment: Performed at [facility], [address], [place], [place] [address]  Urinalysis, Routine w reflex microscopic     Status: Abnormal   Collection Time: 12/29/19  9:53 AM  Result Value Ref Range   Color, Urine YELLOW YELLOW   APPearance CLEAR CLEAR   Specific Gravity, Urine 1.043 (H) 1.005 - 1.030   pH 5.0 5.0 - 8.0   Glucose, UA NEGATIVE NEGATIVE mg/dL   Hgb urine dipstick NEGATIVE NEGATIVE   Bilirubin Urine NEGATIVE NEGATIVE   Ketones, ur NEGATIVE NEGATIVE mg/dL   Protein, ur 030 (A) NEGATIVE mg/dL   Nitrite NEGATIVE NEGATIVE   Leukocytes,Ua NEGATIVE NEGATIVE   RBC / HPF 0-5 0 - 5 RBC/hpf   WBC, UA 0-5 0 - 5 WBC/hpf   Bacteria, UA RARE (A) NONE SEEN   Squamous Epithelial / LPF 0-5 0 - 5   Hyaline Casts, UA PRESENT     Comment: Performed at [facility], [address] [place], [place] [address]   CT ABDOMEN PELVIS W CONTRAST  Result Date: 12/29/2019 CLINICAL DATA:  Abdominal pain for 2 weeks with vomiting.  History of gallbladder surgery 2 months ago EXAM: CT ABDOMEN AND PELVIS WITH CONTRAST TECHNIQUE: Multidetector CT imaging of the abdomen and pelvis was performed using the standard protocol following bolus administration of intravenous contrast. CONTRAST:  OMNIPAQUE IOHEXOL 300 MG/ML  SOLN COMPARISON:  10/27/2019 FINDINGS: Lower chest: Patchy nodular pattern in the right middle lobe is new compared to previous studies. No signs of pleural effusion. Hepatobiliary: No signs of suspicious focal liver lesion. Cyst in the right hepatic lobe is unchanged. Other small low-density foci and focal fat intensive occasion about the gallbladder fossa are stable findings. The portal vein is patent. Post cholecystectomy with pneumobilia and biliary ductal dilation, duct is dilated to approximately 12 mm previously approximately 9 mm on 10/27/2019. The amount of pneumobilia is increased in the setting of a small bowel obstruction, see below. Pancreas: Mild increase in pancreatic ductal distension since the previous study but with 3 mm dilation of main pancreatic duct. No signs of peripancreatic inflammation. Spleen: Spleen is normal in size and without focal lesion. Adrenals/Urinary Tract: Adrenal glands are normal. Signs of renal cysts bilaterally. No signs of hydronephrosis. Urinary bladder is normal. Stomach/Bowel: Diffuse small bowel dilation including dilation of duodenum extending back into the stomach which is markedly dilated. Bowel loops are dilated to the upper central pelvis where there is a transition point and complete decompression of bowel loops beyond the transition point. Suspected area of transition (image 69, series 3) also seen on coronal images 96 through 88 where there is abrupt narrowing of the small bowel loops in the pelvis. This is likely proximal to mid ileum. The appendix is normal. Stool filling the colon partially which is mainly under distended. Vascular/Lymphatic: Extensive atherosclerotic  changes throughout the abdominal aorta, no signs of aneurysm. Vascular atherosclerotic changes track into the iliac vessels. No signs of retroperitoneal adenopathy. No upper abdominal lymphadenopathy. No signs of pelvic adenopathy. Reproductive: Uterus grossly normal. Bowel loops which are dilated surround the uterus and adnexa. No signs of adnexal mass.  Other: No signs of free air. No signs of pneumatosis. No visible hernia. Musculoskeletal: Spinal degenerative changes without acute or destructive bone process. IMPRESSION: 1. High-grade small bowel obstruction with complete decompression of distal ileum and near complete decompression of the colon, transition point in the central pelvis likely due to adhesions. 2. Post cholecystectomy with pneumobilia and biliary ductal dilation, correlate with any signs of cholangitis though given recent sphincterotomy and marked bowel distension findings could be explained in the setting of small bowel obstruction. 3. Mild increase in pancreatic ductal distension since the previous study, attention on follow-up. 4. New patchy nodular pattern in the right middle lobe. Findings may represent pneumonitis, perhaps from aspiration. Correlate with nausea and vomiting. 5. Extensive atherosclerotic changes of the abdominal aorta. 6. Bilateral renal cysts. Aortic Atherosclerosis (ICD10-I70.0). Electronically Signed   By: Donzetta Kohut M.D.   On: 12/29/2019 09:55      Assessment/Plan HTN HLD  SBO, likely 2/2 adhesions - prior h/o tubal ligation and laparoscopic cholecystectomy 09/05/2019 - CT scan shows high-grade small bowel obstruction with complete decompression of distal ileum and near complete decompression of the colon, transition point in the central pelvis likely due to adhesions - Will place NG tube to LIWS and then start patient on small bowel protocol. Potassium being replaced. Encourage mobilization. Will continue to follow.  ID - none VTE - SCDs, sq heparin FEN  - IVF, NPO/NGT to LIWS Foley - none Follow up - TBD  Franne Forts, [facility] 12/29/2019, 2:05 PM Please see Amion for pager number during day hours 7:00am-4:30pm

## 2019-12-29 NOTE — ED Notes (Signed)
Pt iv from iv team infiltrated and family medicine dr is putting an order in for a picc line

## 2019-12-29 NOTE — ED Triage Notes (Signed)
The pt is c/o abd pain for 2 weeks with vomiting  She had gb surgery 2 months ago

## 2019-12-29 NOTE — ED Notes (Signed)
This RN reached out to admitting team regarding need for NG tube placement. Informed by MD that surgery consult in the morning will determine need for placement. No NG tube in place at this time. Pt denies N/V.

## 2019-12-29 NOTE — Progress Notes (Signed)
Peripherally Inserted Central Catheter/Midline Placement  The IV Nurse has discussed with the patient and/or persons authorized to consent for the patient, the purpose of this procedure and the potential benefits and risks involved with this procedure.  The benefits include less needle sticks, lab draws from the catheter, and the patient may be discharged home with the catheter. Risks include, but not limited to, infection, bleeding, blood clot (thrombus formation), and puncture of an artery; nerve damage and irregular heartbeat and possibility to perform a PICC exchange if needed/ordered by physician.  Alternatives to this procedure were also discussed.  Bard Power PICC patient education guide, fact sheet on infection prevention and patient information card has been provided to patient /or left at bedside.    PICC/Midline Placement Documentation  PICC Double Lumen 12/29/19 PICC Left Brachial 45 cm (Active)  Indication for Insertion or Continuance of Line Poor Vasculature-patient has had multiple peripheral attempts or PIVs lasting less than 24 hours 12/29/19 1500  Site Assessment Clean;Dry;Intact 12/29/19 1500  Lumen #1 Status Flushed;Blood return noted 12/29/19 1500  Lumen #2 Status Flushed;Blood return noted 12/29/19 1500  Dressing Type Transparent 12/29/19 1500  Dressing Status Clean;Dry;Intact;Antimicrobial disc in place 12/29/19 1500  Dressing Change Due 01/05/20 12/29/19 1500       Stacie Glaze Horton 12/29/2019, 3:58 PM

## 2019-12-30 ENCOUNTER — Inpatient Hospital Stay (HOSPITAL_COMMUNITY): Payer: Medicare PPO

## 2019-12-30 DIAGNOSIS — I340 Nonrheumatic mitral (valve) insufficiency: Secondary | ICD-10-CM

## 2019-12-30 LAB — CBC WITH DIFFERENTIAL/PLATELET
Abs Immature Granulocytes: 0.04 10*3/uL (ref 0.00–0.07)
Basophils Absolute: 0 10*3/uL (ref 0.0–0.1)
Basophils Relative: 0 %
Eosinophils Absolute: 0 10*3/uL (ref 0.0–0.5)
Eosinophils Relative: 0 %
HCT: 37.3 % (ref 36.0–46.0)
Hemoglobin: 12.8 g/dL (ref 12.0–15.0)
Immature Granulocytes: 0 %
Lymphocytes Relative: 6 %
Lymphs Abs: 0.6 10*3/uL — ABNORMAL LOW (ref 0.7–4.0)
MCH: 35 pg — ABNORMAL HIGH (ref 26.0–34.0)
MCHC: 34.3 g/dL (ref 30.0–36.0)
MCV: 101.9 fL — ABNORMAL HIGH (ref 80.0–100.0)
Monocytes Absolute: 0.8 10*3/uL (ref 0.1–1.0)
Monocytes Relative: 8 %
Neutro Abs: 8.5 10*3/uL — ABNORMAL HIGH (ref 1.7–7.7)
Neutrophils Relative %: 86 %
Platelets: 282 10*3/uL (ref 150–400)
RBC: 3.66 MIL/uL — ABNORMAL LOW (ref 3.87–5.11)
RDW: 14.9 % (ref 11.5–15.5)
WBC: 9.9 10*3/uL (ref 4.0–10.5)
nRBC: 0 % (ref 0.0–0.2)

## 2019-12-30 LAB — COMPREHENSIVE METABOLIC PANEL
ALT: 14 U/L (ref 0–44)
AST: 30 U/L (ref 15–41)
Albumin: 3.3 g/dL — ABNORMAL LOW (ref 3.5–5.0)
Alkaline Phosphatase: 46 U/L (ref 38–126)
Anion gap: 12 (ref 5–15)
BUN: 26 mg/dL — ABNORMAL HIGH (ref 8–23)
CO2: 24 mmol/L (ref 22–32)
Calcium: 8.8 mg/dL — ABNORMAL LOW (ref 8.9–10.3)
Chloride: 101 mmol/L (ref 98–111)
Creatinine, Ser: 0.85 mg/dL (ref 0.44–1.00)
GFR calc Af Amer: >60 mL/min (ref >60)
GFR calc non Af Amer: >60 mL/min (ref >60)
Glucose, Bld: 86 mg/dL (ref 70–99)
Potassium: 3.3 mmol/L — ABNORMAL LOW (ref 3.5–5.1)
Sodium: 137 mmol/L (ref 135–145)
Total Bilirubin: 1.3 mg/dL — ABNORMAL HIGH (ref 0.3–1.2)
Total Protein: 6.1 g/dL — ABNORMAL LOW (ref 6.5–8.1)

## 2019-12-30 LAB — BASIC METABOLIC PANEL
Anion gap: 10 (ref 5–15)
BUN: 19 mg/dL (ref 8–23)
CO2: 25 mmol/L (ref 22–32)
Calcium: 8.6 mg/dL — ABNORMAL LOW (ref 8.9–10.3)
Chloride: 106 mmol/L (ref 98–111)
Creatinine, Ser: 0.72 mg/dL (ref 0.44–1.00)
GFR calc Af Amer: >60 mL/min (ref >60)
GFR calc non Af Amer: >60 mL/min (ref >60)
Glucose, Bld: 83 mg/dL (ref 70–99)
Potassium: 3 mmol/L — ABNORMAL LOW (ref 3.5–5.1)
Sodium: 141 mmol/L (ref 135–145)

## 2019-12-30 LAB — PHOSPHORUS: Phosphorus: 2.3 mg/dL — ABNORMAL LOW (ref 2.5–4.6)

## 2019-12-30 LAB — MAGNESIUM: Magnesium: 2.3 mg/dL (ref 1.7–2.4)

## 2019-12-30 LAB — ECHOCARDIOGRAM COMPLETE: Weight: 1887.14 oz

## 2019-12-30 LAB — TROPONIN I (HIGH SENSITIVITY): Troponin I (High Sensitivity): 48 ng/L — ABNORMAL HIGH (ref <18)

## 2019-12-30 MED ORDER — HYDROMORPHONE HCL 1 MG/ML IJ SOLN
0.5000 mg | INTRAMUSCULAR | Status: DC | PRN
  Administered 2019-12-30 – 2020-01-01 (×12): 0.5 mg via INTRAVENOUS
  Filled 2019-12-30 (×14): qty 1

## 2019-12-30 MED ORDER — POTASSIUM CHLORIDE 10 MEQ/100ML IV SOLN: 10.0000 meq | INTRAVENOUS | Status: DC

## 2019-12-30 MED ORDER — BISACODYL 10 MG RE SUPP
10.0000 mg | Freq: Once | RECTAL | Status: AC
  Administered 2019-12-30: 10 mg via RECTAL
  Filled 2019-12-30: qty 1

## 2019-12-30 MED ORDER — MORPHINE SULFATE (PF) 4 MG/ML IV SOLN: 3.0000 mg | INTRAVENOUS | Status: DC | PRN

## 2019-12-30 MED ORDER — MORPHINE SULFATE (PF) 2 MG/ML IV SOLN
2.0000 mg | Freq: Once | INTRAVENOUS | Status: AC
  Administered 2019-12-30: 2 mg via INTRAVENOUS

## 2019-12-30 MED ORDER — METOPROLOL TARTRATE 5 MG/5ML IV SOLN
5.0000 mg | Freq: Two times a day (BID) | INTRAVENOUS | Status: DC
  Administered 2019-12-30 – 2019-12-31 (×4): 5 mg via INTRAVENOUS
  Filled 2019-12-30 (×4): qty 5

## 2019-12-30 MED ORDER — POLYETHYLENE GLYCOL 3350 17 G PO PACK
17.0000 g | PACK | Freq: Every day | ORAL | Status: DC
  Administered 2019-12-30 – 2020-01-03 (×5): 17 g via ORAL
  Filled 2019-12-30 (×5): qty 1

## 2019-12-30 MED ORDER — POTASSIUM CHLORIDE 10 MEQ/100ML IV SOLN
10.0000 meq | INTRAVENOUS | Status: AC
  Administered 2019-12-30: 10 meq via INTRAVENOUS
  Filled 2019-12-30: qty 100

## 2019-12-30 MED ORDER — POTASSIUM CHLORIDE 10 MEQ/100ML IV SOLN
10.0000 meq | INTRAVENOUS | Status: DC
  Administered 2019-12-30 (×3): 10 meq via INTRAVENOUS
  Filled 2019-12-30 (×3): qty 100

## 2019-12-30 MED ORDER — DOCUSATE SODIUM 100 MG PO CAPS
100.0000 mg | ORAL_CAPSULE | Freq: Two times a day (BID) | ORAL | Status: DC
  Administered 2019-12-30 – 2020-01-02 (×8): 100 mg via ORAL
  Filled 2019-12-30 (×8): qty 1

## 2019-12-30 NOTE — Progress Notes (Signed)
Echocardiogram 2D Echocardiogram has been performed.  Samantha Gonzales 12/30/2019, 8:51 AM

## 2019-12-30 NOTE — Progress Notes (Addendum)
Occupational Therapy Evaluation Patient Details Name: Samantha Gonzales MRN: [id number] DOB: [birth year] Today's Date: 12/30/2019    History of Present Illness Pt isa  79 y/o female with PMH of HTN, syncope with prior tubal ligation and laparoscopic cholecystectomy 09/05/2019, presenting to ED with complaints of abdominal pain. CT reveals small bowel obstruction.    Clinical Impression   PTA patient reports independent.  Admitted for above and limited by problem list below, including impaired cognition, stomach pain, generalized weakness and impaired balance.  Patient currently requires min guard for in room mobility, transfers, min assist to min guard for ADLs.  She presents with decreased awareness of safety, deficits, poor memory and problem solving, disoriented to time and situation.  Perseverating on not getting her pain medicine and asking for water to drink throughout session. She will benefit from continued OT services while admitted and after dc at [facility] level given 24/7 assist at discharge to optimize independence and safety with ADLs, IADLs and mobility.  If she does not have 24/7 support, may need to look into SNF rehab.     Follow Up Recommendations  Home health OT;Supervision/Assistance - 24 hour(if she doesn't have 24/7, needs SNF )    Equipment Recommendations  3 in 1 bedside commode    Recommendations for Other Services       Precautions / Restrictions Precautions Precautions: Fall Precaution Comments: cognition Restrictions Weight Bearing Restrictions: No      Mobility Bed Mobility Overal bed mobility: Needs Assistance Bed Mobility: Supine to Sit;Sit to Supine     Supine to sit: Min guard Sit to supine: Min guard   General bed mobility comments: for safety, line mgmt  Transfers Overall transfer level: Needs assistance   Transfers: Sit to/from Stand Sit to Stand: Min guard         General transfer comment: min guard for safety and balance     Balance  Overall balance assessment: Mild deficits observed, not formally tested(pt reaching out for UE support )                                         ADL either performed or assessed with clinical judgement   ADL Overall ADL's : Needs assistance/impaired     Grooming: Min guard;Standing   Upper Body Bathing: Minimal assistance;Sitting;Min guard   Lower Body Bathing: Moderate assistance;Minimal assistance;Maximal assistance;Sit to/from stand   Upper Body Dressing : Min guard;Sitting   Lower Body Dressing: Sit to/from stand;Minimal assistance   Toilet Transfer: Min guard;Ambulation;Regular Toilet;Grab bars   Toileting- Clothing Manipulation and Hygiene: Min guard;Sit to/from stand       Functional mobility during ADLs: Min guard General ADL Comments: min guard for safety, patient with poor awareness to deficits and safety; limited by cognition, weakness and pain      Vision         Perception     Praxis      Pertinent Vitals/Pain Pain Assessment: Faces Faces Pain Scale: Hurts little more Pain Location: stomach  Pain Descriptors / Indicators: Discomfort;Grimacing Pain Intervention(s): Monitored during session;Patient requesting pain meds-RN notified     Hand Dominance Right   Extremity/Trunk Assessment Upper Extremity Assessment Upper Extremity Assessment: Generalized weakness   Lower Extremity Assessment Lower Extremity Assessment: Defer to PT evaluation       Communication Communication Communication: No difficulties   Cognition Arousal/Alertness: Awake/alert Behavior During Therapy: Agitated Overall  Cognitive Status: No family/caregiver present to determine baseline cognitive functioning Area of Impairment: Attention;Orientation;Memory;Following commands;Safety/judgement;Awareness;Problem solving                 Orientation Level: Disoriented to;Time;Situation Current Attention Level: Focused Memory: Decreased short-term  memory Following Commands: Follows one step commands inconsistently;Follows one step commands with increased time Safety/Judgement: Decreased awareness of safety;Decreased awareness of deficits Awareness: Intellectual Problem Solving: Slow processing;Decreased initiation;Difficulty sequencing;Requires verbal cues General Comments: pt with poor awareness of situation and deficits, perseverating on asking for pain medication and water. Requires constant redirection. Unable to verbalize year, reports Tarri Fuller is president.    General Comments       Exercises     Shoulder Instructions      Home Living Family/patient expects to be discharged to:: Private residence Living Arrangements: Children Available Help at Discharge: Family;Available 24 hours/day Type of Home: House Home Access: Stairs to enter     Home Layout: One level     Bathroom Shower/Tub: Teacher, early years/pre: Standard     Home Equipment: None          Prior Functioning/Environment Level of Independence: Independent        Comments: reports family assists her and provides 24/7         OT Problem List: Decreased strength;Decreased activity tolerance;Impaired balance (sitting and/or standing);Pain;Decreased knowledge of precautions;Decreased knowledge of use of DME or AE;Decreased safety awareness;Decreased cognition      OT Treatment/Interventions: Self-care/ADL training;Energy conservation;DME and/or AE instruction;Therapeutic activities;Cognitive remediation/compensation;Patient/family education;Balance training    OT Goals(Current goals can be found in the care plan section) Acute Rehab OT Goals Patient Stated Goal: to drink some water  OT Goal Formulation: With patient Time For Goal Achievement: 01/13/20 Potential to Achieve Goals: Good  OT Frequency: Min 2X/week   Barriers to D/C:            Co-evaluation PT/OT/SLP Co-Evaluation/Treatment: Yes Reason for Co-Treatment: Necessary to  address cognition/behavior during functional activity   OT goals addressed during session: ADL's and self-care      AM-PAC OT "6 Clicks" Daily Activity     Outcome Measure Help from another person eating meals?: Total(npo) Help from another person taking care of personal grooming?: A Little Help from another person toileting, which includes using toliet, bedpan, or urinal?: A Little Help from another person bathing (including washing, rinsing, drying)?: A Little Help from another person to put on and taking off regular upper body clothing?: A Little Help from another person to put on and taking off regular lower body clothing?: A Little 6 Click Score: 16   End of Session Nurse Communication: Mobility status;Precautions;Other (comment)(wanting pain meds and water)  Activity Tolerance: Treatment limited secondary to agitation Patient left: in bed;with call bell/phone within reach;with bed alarm set  OT Visit Diagnosis: Other abnormalities of gait and mobility (R26.89);Muscle weakness (generalized) (M62.81);Pain;Other symptoms and signs involving cognitive function Pain - part of body: (stomach)                Time: 9735-3299 OT Time Calculation (min): 33 min Charges:  OT General Charges $OT Visit: 1 Visit OT Evaluation $OT Eval Moderate Complexity: 1 Mod  Jolaine Artist, OT Acute Rehabilitation Services Pager [phone] Office [phone]   Delight Stare 12/30/2019, 12:26 PM

## 2019-12-30 NOTE — Evaluation (Signed)
Physical Therapy Evaluation Patient Details Name: Samantha Gonzales MRN: [id number] DOB: 02-17-1941 Today's Date: 12/30/2019   History of Present Illness  Pt isa  79 y/o female with PMH of HTN, syncope with prior tubal ligation and laparoscopic cholecystectomy 09/05/2019, presenting to ED with complaints of abdominal pain. CT reveals small bowel obstruction suspected secondary to adhesions.  Clinical Impression   Pt presents with generalized weakness, significant abdominal pain, increased time and effort to mobilize, unsteadiness in standing, impaired cognition especially safety awareness, and decreased activity tolerance suspected secondary to abdominal pain. Pt to benefit from acute PT to address deficits. Pt ambulated room distance this session, very difficult to motivate to participate in PT/OT due to pt perseverating on abdominal pain, not getting pain medications (even though she received meds prior to session), and wanting water. Unsure of pt's cognitive baseline, but pt appears confused today. PT recommending HHPT with 24/7 assist from family upon d/c from acute setting. PT to progress mobility as tolerated, and will continue to follow acutely.      Follow Up Recommendations Home health PT;Supervision/Assistance - 24 hour    Equipment Recommendations  None recommended by PT    Recommendations for Other Services       Precautions / Restrictions Precautions Precautions: Fall Precaution Comments: cognition Restrictions Weight Bearing Restrictions: No      Mobility  Bed Mobility Overal bed mobility: Needs Assistance Bed Mobility: Supine to Sit;Sit to Supine     Supine to sit: Min guard Sit to supine: Min guard   General bed mobility comments: for safety, line mgmt. Verbal cuing for log roll technique in and out of bed for abdominal comfort, not followed.  Transfers Overall transfer level: Needs assistance Equipment used: None Transfers: Sit to/from Stand Sit to Stand: Min  guard         General transfer comment: min guard for safety, pt with self-steadying upon standing and states "back up, don't help me"  Ambulation/Gait Ambulation/Gait assistance: Min guard;+2 safety/equipment Gait Distance (Feet): 25 Feet(to and from bathroom) Assistive device: None Gait Pattern/deviations: Decreased stride length;Step-through pattern;Trunk flexed Gait velocity: decr   General Gait Details: close guard for pt safety, pt unpredictable and impulsive with mobility and lacks awareness of lines/leads requiring +2 for safety this session.  Stairs            Wheelchair Mobility    Modified Rankin (Stroke Patients Only)       Balance Overall balance assessment: Needs assistance Sitting-balance support: No upper extremity supported;Feet supported Sitting balance-Leahy Scale: Good     Standing balance support: No upper extremity supported Standing balance-Leahy Scale: Fair Standing balance comment: able to ambulate without UE support, reaches for environment for steadying                             Pertinent Vitals/Pain Pain Assessment: Faces Faces Pain Scale: Hurts little more Pain Location: stomach  Pain Descriptors / Indicators: Discomfort;Grimacing Pain Intervention(s): Limited activity within patient's tolerance;Monitored during session;Premedicated before session;Repositioned;Patient requesting pain meds-RN notified    Home Living Family/patient expects to be discharged to:: Private residence Living Arrangements: Children Available Help at Discharge: Family;Available 24 hours/day Type of Home: House Home Access: Stairs to enter     Home Layout: One level Home Equipment: None      Prior Function Level of Independence: Independent         Comments: reports family assists her and provides 24/7, states "when one  kid leaves, another one comes in right behind them"     Hand Dominance   Dominant Hand: Right     Extremity/Trunk Assessment   Upper Extremity Assessment Upper Extremity Assessment: Defer to OT evaluation    Lower Extremity Assessment Lower Extremity Assessment: Generalized weakness    Cervical / Trunk Assessment Cervical / Trunk Assessment: Normal  Communication   Communication: No difficulties  Cognition Arousal/Alertness: Awake/alert Behavior During Therapy: Restless;Agitated Overall Cognitive Status: No family/caregiver present to determine baseline cognitive functioning Area of Impairment: Attention;Orientation;Memory;Following commands;Safety/judgement;Awareness;Problem solving                 Orientation Level: Disoriented to;Time;Situation Current Attention Level: Focused Memory: Decreased short-term memory Following Commands: Follows one step commands inconsistently;Follows one step commands with increased time Safety/Judgement: Decreased awareness of safety;Decreased awareness of deficits Awareness: Intellectual Problem Solving: Slow processing;Decreased initiation;Difficulty sequencing;Requires verbal cues General Comments: pt with poor awareness of situation and deficits, perseverating on asking for pain medication and water. Requires constant redirection. Unable to verbalize year, reports Tarri Fuller is president.       General Comments      Exercises     Assessment/Plan    PT Assessment Patient needs continued PT services  PT Problem List Decreased strength;Decreased mobility;Decreased activity tolerance;Decreased balance;Decreased cognition;Pain;Decreased safety awareness       PT Treatment Interventions Therapeutic activities;Gait training;Therapeutic exercise;Balance training;Stair training;Patient/family education;Functional mobility training    PT Goals (Current goals can be found in the Care Plan section)  Acute Rehab PT Goals Patient Stated Goal: go home PT Goal Formulation: With patient Time For Goal Achievement: 01/13/20 Potential to  Achieve Goals: Good    Frequency Min 3X/week   Barriers to discharge        Co-evaluation PT/OT/SLP Co-Evaluation/Treatment: Yes Reason for Co-Treatment: Necessary to address cognition/behavior during functional activity PT goals addressed during session: Mobility/safety with mobility OT goals addressed during session: ADL's and self-care       AM-PAC PT "6 Clicks" Mobility  Outcome Measure Help needed turning from your back to your side while in a flat bed without using bedrails?: A Little Help needed moving from lying on your back to sitting on the side of a flat bed without using bedrails?: A Little Help needed moving to and from a bed to a chair (including a wheelchair)?: A Little Help needed standing up from a chair using your arms (e.g., wheelchair or bedside chair)?: A Little Help needed to walk in hospital room?: A Little Help needed climbing 3-5 steps with a railing? : A Lot 6 Click Score: 17    End of Session   Activity Tolerance: Patient limited by fatigue;Patient limited by pain Patient left: in bed;with call bell/phone within reach;with bed alarm set Nurse Communication: Mobility status PT Visit Diagnosis: Other abnormalities of gait and mobility (R26.89);Unsteadiness on feet (R26.81);Pain Pain - Right/Left: (mid) Pain - part of body: (abdomen)    Time: 8676-7209 PT Time Calculation (min) (ACUTE ONLY): 18 min   Charges:   PT Evaluation $PT Eval Low Complexity: 1 Low         Kyra Laffey E, PT Acute Rehabilitation Services Pager [phone]  Office [phone]   Oceanna Arruda D Elonda Husky 12/30/2019, 1:58 PM

## 2019-12-30 NOTE — Progress Notes (Signed)
Patient continues to complain of pain despite administration of PRN Morphine. She states we are giving her water instead of her pain medication and it is not working. She is requsting to speak with the MD. RN called Dr. Leary Roca and she spoke with patient via phone and then came to the bedside. Dr. Leary Roca states she will change the PRN Morphine to PRN Dilaudid. Awaiting orders.

## 2019-12-30 NOTE — Progress Notes (Signed)
Family Medicine Teaching Service Daily Progress Note Intern Pager: [phone]  Patient name: Samantha Gonzales Medical record number: [id number] Date of birth: 06-15-1941 Age: 79 y.o. Gender: female  Primary Care Provider: System, Pcp Not In Consultants: Surgery Code Status: Full code  Pt Overview and Major Events to Date:  3/4 patient admitted for small bowel obstruction  Assessment and Plan: Samantha Gonzales is a 79 y.o. female presenting with 4-week history of progressive abdominal pain.  CT scan consistent with small bowel obstruction.  Small bowel obstruction Likely secondary to adhesions with history of tubal ligation laparoscopic cholecystectomy on 09/05/2019.  General surgery was consulted and is helping in management.  Patient had NG tube placed in ED but pulled it out.  NG tube was replaced and patient pulled second NG tube out.  Gastrografin was ordered but was not received because patient had removed NG tube.  Overnight patient had bowel movement. -General surgery consulted recommendations appreciated -Morphine 2 mg every 4 hours for pain -Repeat abdominal film -If abdominal film improved, surgery will start CLD with bowel regiment -If film is worsened will evaluate for p.o. Gastrografin vs NG tube replacement with Gastrografin -Replace NG tube if patient nauseated or vomiting to mitigate aspiration risk -Vital signs per floor routine  Hypokalemia On admission potassium was 2.6 likely secondary to p.o. failure.  K this morning was 3.3. -4 runs of potassium ordered -Morning BMPs  Questionable murmur/EKG findings On admission new murmur appreciated.  EKG was performed concerns for anterior lead elevation, no reciprocal depression.  Troponins were trended 54>48.  Repeat EKG 3/5 showed tachycardia with  elevations in anterior leads but no reciprocal changes..  Patient denies any chest pain, shortness of breath.  -Echocardiogram ordered -Consider cardiology  consult  Hyperlipidemia Home medications include Crestor 20 mg. -Holding while patient is n.p.o. -Restart when patient is no longer n.p.o.  Hypertension/tachycardia Patient has history of hypertension.  Blood pressure over the last 24 hours has ranged from 137/67-195/87. -Once pain is decreased consider starting antihypertensive medication  Hyperglycemia CBG on admission was 132.  Patient has no documented history of diabetes. -Consider hemoglobin A1c  FEN/GI: N.p.o. at this time PPx: Heparin subcu  Disposition: Pending further management of SBO as well as evaluation by PT/OT.  Subjective:  When I enter the room patient is having echocardiogram done.  She is sitting quietly while ultrasonographer performs exam.  When asked how she is doing she said that she is in pain.  She reports that her stomach hurts and is asking for pain medications.  She reports that she has not received any pain medications.  I informed her that her most recent dose of pain medication was 4 hours prior to my visit but that it was time for her to get another dose.  Patient rates her pain "around an 8 or 9 out of 10".  Objective: Temp:  [98 F (36.7 C)-99.7 F (37.6 C)] 99.7 F (37.6 C) (03/05 0425) Pulse Rate:  [72-121] 92 (03/05 0425) Resp:  [10-23] 17 (03/05 0425) BP: (137-195)/(58-105) 146/66 (03/05 0425) SpO2:  [97 %-100 %] 100 % (03/05 0425) Physical Exam: General: Patient is sitting comfortably allowing ultrasonographer to perform echocardiogram Cardiovascular: Tachycardic rate, regular rhythm, holosystolic murmur heard Respiratory: Normal work of breathing, clear to auscultation bilaterally, Abdomen: Not noticeably distended.  Tender to palpation in the lower abdomen. Extremities: No edema noted Psych: Patient is alert and oriented to person.  When asked if she knows where she is she reports in the  basement of [facility].  She is not aware of time and says that it is November  2002.  Laboratory: Recent Labs  Lab 12/29/19 0421 12/30/19 0048  WBC 14.7* 9.9  HGB 13.9 12.8  HCT 40.3 37.3  PLT 319 282   Recent Labs  Lab 12/29/19 0421 12/30/19 0048  NA 135 137  K 2.6* 3.3*  CL 93* 101  CO2 25 24  BUN 25* 26*  CREATININE 0.87 0.85  CALCIUM 9.9 8.8*  PROT 7.3 6.1*  BILITOT 0.9 1.3*  ALKPHOS 56 46  ALT 12 14  AST 26 30  GLUCOSE 132* 86   Troponin-52> 48  Imaging/Diagnostic Tests: DG Abd Portable 1V  Result Date: 12/30/2019 CLINICAL DATA:  Abdominal pain. EXAM: PORTABLE ABDOMEN - 1 VIEW COMPARISON:  12/29/2019. FINDINGS: Surgical clips right upper quadrant. Previously identified NG tube no longer visualized. No gastric distention. Stable bowel distention. No free air. Air is again noted in the biliary system. Degenerative change thoracolumbar spine. IMPRESSION: 1.  NG tube no longer visualized. 2. No gastric distention. Persistent bowel distention, reference is made to prior CT report of 12/29/2019. 3.  Pneumobilia again noted consistent with prior sphincterotomy. Electronically Signed   By: Maisie Fus  Register   On: 12/30/2019 12:54   ECHOCARDIOGRAM COMPLETE  Result Date: 12/30/2019    ECHOCARDIOGRAM REPORT   Patient Name:   Samantha Gonzales Date of Exam: 12/30/2019 Medical Rec #:  [id number]       Height:       72.0 in Accession #:    [id number]      Weight:       117.9 lb Date of Birth:  February 05, 1941       BSA:          1.702 m Patient Age:    78 years        BP:           146/66 mmHg Patient Gender: F               HR:           92 bpm. Exam Location:  Inpatient Procedure: 2D Echo, Cardiac Doppler and Color Doppler Indications:    Murmur  History:        Patient has prior history of Echocardiogram examinations, most                 recent 01/18/2018. Signs/Symptoms:Altered Mental Status and                 Murmur; Risk Factors:Hypertension.  Sonographer:    Lavenia Atlas Referring Phys: [phone] SARA L NEAL  Sonographer Comments: Image acquisition challenging due to  uncooperative patient. IMPRESSIONS  1. Left ventricular ejection fraction, by estimation, is 65 to 70%. The left ventricle has hyperdynamic function. The left ventricle has no regional wall motion abnormalities. There is moderate left ventricular hypertrophy. There was mitral valve systolic anterior motion noted with LV outflow gradient, peak 58 mmHg. Left ventricular diastolic parameters are consistent with Grade I diastolic dysfunction (impaired relaxation). 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  2. Right ventricular systolic function is normal. The right ventricular size is normal. Tricuspid regurgitation signal is inadequate for assessing PA pressure.  3. The mitral valve is normal in structure and function. Mild mitral valve regurgitation (not well-visualized). As above, systolic anterior motion of the mitral valve was noted. No evidence of mitral stenosis.  4. The aortic  valve is tricuspid. Aortic valve regurgitation is not visualized. Mild aortic valve sclerosis is present, with no evidence of aortic valve stenosis.  5. The inferior vena cava is normal in size with <50% respiratory variability, suggesting right atrial pressure of 8 mmHg. FINDINGS  Left Ventricle: Left ventricular ejection fraction, by estimation, is 65 to 70%. The left ventricle has hyperdynamic function. The left ventricle has no regional wall motion abnormalities. The left ventricular internal cavity size was normal in size. There is moderate left ventricular hypertrophy. Left ventricular diastolic parameters are consistent with Grade I diastolic dysfunction (impaired relaxation). Right Ventricle: The right ventricular size is normal. No increase in right ventricular wall thickness. Right ventricular systolic function is normal. Tricuspid regurgitation signal is inadequate for assessing PA pressure. Left Atrium: Left atrial size was  normal in size. Right Atrium: Right atrial size was normal in size. Pericardium: A small pericardial effusion is present. Mitral Valve: The mitral valve is normal in structure and function. There is mild calcification of the mitral valve leaflet(s). Mild mitral annular calcification. Mild mitral valve regurgitation. No evidence of mitral valve stenosis. Tricuspid Valve: The tricuspid valve is normal in structure. Tricuspid valve regurgitation is not demonstrated. Aortic Valve: The aortic valve is tricuspid. Aortic valve regurgitation is not visualized. Mild aortic valve sclerosis is present, with no evidence of aortic valve stenosis. Pulmonic Valve: The pulmonic valve was normal in structure. Pulmonic valve regurgitation is trivial. Aorta: The aortic root is normal in size and structure. Venous: The inferior vena cava is normal in size with less than 50% respiratory variability, suggesting right atrial pressure of 8 mmHg. IAS/Shunts: No atrial level shunt detected by color flow Doppler.  LEFT VENTRICLE PLAX 2D LVIDd:         2.50 cm  Diastology LVIDs:         1.60 cm  LV e' lateral:   6.09 cm/s LV PW:         1.30 cm  LV E/e' lateral: 14.7 LV IVS:        1.30 cm  LV e' medial:    9.68 cm/s LVOT diam:     1.80 cm  LV E/e' medial:  9.2 LV SV:         83 LV SV Index:   49 LVOT Area:     2.54 cm  RIGHT VENTRICLE RV Basal diam:  1.80 cm RV S prime:     5.11 cm/s TAPSE (M-mode): 2.7 cm LEFT ATRIUM             Index       RIGHT ATRIUM          Index LA diam:        2.30 cm 1.35 cm/m  RA Area:     8.27 cm LA Vol (A2C):   18.6 ml 10.93 ml/m RA Volume:   16.80 ml 9.87 ml/m LA Vol (A4C):   35.0 ml 20.56 ml/m LA Biplane Vol: 26.2 ml 15.39 ml/m  AORTIC VALVE LVOT Vmax:   187.00 cm/s LVOT Vmean:  114.000 cm/s LVOT VTI:    0.327 m  AORTA Ao Root diam: 2.60 cm MITRAL VALVE MV Area (PHT): 2.39 cm     SHUNTS MV Decel Time: 317 msec     Systemic VTI:  0.33 m MV E velocity: 89.50 cm/s   Systemic Diam: 1.80 cm MV A velocity:  122.00 cm/s MV E/A ratio:  0.73 Marca Ancona MD Electronically signed by Marca Ancona MD Signature Date/Time: 12/30/2019/12:08:11 PM  Final      Derrel Nip, MD 12/30/2019, 6:12 AM PGY-1, [facility] Family Medicine FPTS Intern pager: [phone], text pages welcome

## 2019-12-30 NOTE — Progress Notes (Addendum)
[facility] Progress Note     Subjective: Patient reports lower abdominal pain. Had a BM. No emesis since NGT removal, some mild nausea which patient attributes to not having had anything to drink. Patient became argumentative when I recommended abdominal film prior to initiating liquid diet.   Review of Systems  Constitutional: Negative for chills and fever.  Respiratory: Negative for shortness of breath.   Cardiovascular: Negative for chest pain.  Gastrointestinal: Positive for abdominal pain and nausea. Negative for vomiting.  Genitourinary: Negative for dysuria, frequency and urgency.     Objective: Vital signs in last 24 hours: Temp:  [98.4 F (36.9 C)-99.7 F (37.6 C)] 99.7 F (37.6 C) (03/05 0425) Pulse Rate:  [81-117] 81 (03/05 1010) Resp:  [11-17] 17 (03/05 0425) BP: (137-184)/(58-86) 145/61 (03/05 1010) SpO2:  [97 %-100 %] 100 % (03/05 0425)    Intake/Output from previous day: 03/04 0701 - 03/05 0700 In: 1280.2 [IV Piggyback:1280.2] Out: -  Intake/Output this shift: No intake/output data recorded.  PE: General: pleasant, WD, thin female who is laying in bed in NAD HEENT:  Sclera are noninjected.  PERRL.  Ears and nose without any masses or lesions.  Mouth is pink and moist Heart: regular, rate, and rhythm.  Normal s1,s2. No obvious murmurs, gallops, or rubs noted.  Palpable radial and pedal pulses bilaterally Lungs: CTAB, no wheezes, rhonchi, or rales noted.  Respiratory effort nonlabored Abd: soft, TTP in lower abdomen, ND, +BS, no masses, hernias, or organomegaly MS: all 4 extremities are symmetrical with no cyanosis, clubbing, or edema. Skin: warm and dry with no masses, lesions, or rashes Neuro: Cranial nerves 2-12 grossly intact, sensation grossly intact througout Psych: A&Ox3 with an appropriate affect.   Lab Results:  Recent Labs    12/29/19 0421 12/30/19 0048  WBC 14.7* 9.9  HGB 13.9 12.8  HCT 40.3 37.3  PLT 319 282   BMET Recent  Labs    12/29/19 0421 12/30/19 0048  NA 135 137  K 2.6* 3.3*  CL 93* 101  CO2 25 24  GLUCOSE 132* 86  BUN 25* 26*  CREATININE 0.87 0.85  CALCIUM 9.9 8.8*   PT/INR No results for input(s): LABPROT, INR in the last 72 hours. CMP     Component Value Date/Time   NA 137 12/30/2019 0048   K 3.3 (L) 12/30/2019 0048   CL 101 12/30/2019 0048   CO2 24 12/30/2019 0048   GLUCOSE 86 12/30/2019 0048   BUN 26 (H) 12/30/2019 0048   CREATININE 0.85 12/30/2019 0048   CALCIUM 8.8 (L) 12/30/2019 0048   PROT 6.1 (L) 12/30/2019 0048   ALBUMIN 3.3 (L) 12/30/2019 0048   AST 30 12/30/2019 0048   ALT 14 12/30/2019 0048   ALKPHOS 46 12/30/2019 0048   BILITOT 1.3 (H) 12/30/2019 0048   GFRNONAA >60 12/30/2019 0048   GFRAA >60 12/30/2019 0048   Lipase     Component Value Date/Time   LIPASE 16 12/29/2019 0421       Studies/Results: CT ABDOMEN PELVIS W CONTRAST  Result Date: 12/29/2019 CLINICAL DATA:  Abdominal pain for 2 weeks with vomiting. History of gallbladder surgery 2 months ago EXAM: CT ABDOMEN AND PELVIS WITH CONTRAST TECHNIQUE: Multidetector CT imaging of the abdomen and pelvis was performed using the standard protocol following bolus administration of intravenous contrast. CONTRAST:  OMNIPAQUE IOHEXOL 300 MG/ML  SOLN COMPARISON:  10/27/2019 FINDINGS: Lower chest: Patchy nodular pattern in the right middle lobe is new compared to previous studies. No  signs of pleural effusion. Hepatobiliary: No signs of suspicious focal liver lesion. Cyst in the right hepatic lobe is unchanged. Other small low-density foci and focal fat intensive occasion about the gallbladder fossa are stable findings. The portal vein is patent. Post cholecystectomy with pneumobilia and biliary ductal dilation, duct is dilated to approximately 12 mm previously approximately 9 mm on 10/27/2019. The amount of pneumobilia is increased in the setting of a small bowel obstruction, see below. Pancreas: Mild increase in  pancreatic ductal distension since the previous study but with 3 mm dilation of main pancreatic duct. No signs of peripancreatic inflammation. Spleen: Spleen is normal in size and without focal lesion. Adrenals/Urinary Tract: Adrenal glands are normal. Signs of renal cysts bilaterally. No signs of hydronephrosis. Urinary bladder is normal. Stomach/Bowel: Diffuse small bowel dilation including dilation of duodenum extending back into the stomach which is markedly dilated. Bowel loops are dilated to the upper central pelvis where there is a transition point and complete decompression of bowel loops beyond the transition point. Suspected area of transition (image 69, series 3) also seen on coronal images 96 through 88 where there is abrupt narrowing of the small bowel loops in the pelvis. This is likely proximal to mid ileum. The appendix is normal. Stool filling the colon partially which is mainly under distended. Vascular/Lymphatic: Extensive atherosclerotic changes throughout the abdominal aorta, no signs of aneurysm. Vascular atherosclerotic changes track into the iliac vessels. No signs of retroperitoneal adenopathy. No upper abdominal lymphadenopathy. No signs of pelvic adenopathy. Reproductive: Uterus grossly normal. Bowel loops which are dilated surround the uterus and adnexa. No signs of adnexal mass. Other: No signs of free air. No signs of pneumatosis. No visible hernia. Musculoskeletal: Spinal degenerative changes without acute or destructive bone process. IMPRESSION: 1. High-grade small bowel obstruction with complete decompression of distal ileum and near complete decompression of the colon, transition point in the central pelvis likely due to adhesions. 2. Post cholecystectomy with pneumobilia and biliary ductal dilation, correlate with any signs of cholangitis though given recent sphincterotomy and marked bowel distension findings could be explained in the setting of small bowel obstruction. 3. Mild  increase in pancreatic ductal distension since the previous study, attention on follow-up. 4. New patchy nodular pattern in the right middle lobe. Findings may represent pneumonitis, perhaps from aspiration. Correlate with nausea and vomiting. 5. Extensive atherosclerotic changes of the abdominal aorta. 6. Bilateral renal cysts. Aortic Atherosclerosis (ICD10-I70.0). Electronically Signed   By: Donzetta Kohut M.D.   On: 12/29/2019 09:55   DG Abd Portable 1V-Small Bowel Protocol-Position Verification  Result Date: 12/29/2019 CLINICAL DATA:  NG tube placement. EXAM: PORTABLE ABDOMEN - 1 VIEW COMPARISON:  CT 12/29/2019. FINDINGS: NG tube tip noted over the upper portion of the stomach. NG tube side hole noted just above the gastroesophageal junction. Advancement of the NG tube approximately 10 cm should be considered. Small-bowel distention improved. No free intraperitoneal air noted. Air in the biliary system is again noted, reference is made to prior CT report 12/29/2019. Contrast in the renal collecting system from prior CT. No acute bony abnormality identified. Tiny sclerotic density noted over the right ilium consistent with small bone island. IMPRESSION: 1. NG tube noted with its tip over the upper most portion of the stomach. NG tube side hole is just above the gastroesophageal junction. NG tube advancement of approximately 15 cm should be considered. 2. Interim improvement in bowel distention. No free air. Air is again noted the biliary system, most likely from prior  sphincterotomy, reference is made to prior CT report of 12/29/2019. Electronically Signed   By: Marcello Moores  Register   On: 12/29/2019 14:34   ECHOCARDIOGRAM COMPLETE  Result Date: 12/30/2019    ECHOCARDIOGRAM REPORT   Patient Name:   KRISSIA SCHREIER Date of Exam: 12/30/2019 Medical Rec #:  [id number]       Height:       72.0 in Accession #:    [id number]      Weight:       117.9 lb Date of Birth:  [birth year]       BSA:          1.702 m Patient Age:     79 years        BP:           146/66 mmHg Patient Gender: F               HR:           92 bpm. Exam Location:  Inpatient Procedure: 2D Echo, Cardiac Doppler and Color Doppler Indications:    Murmur  History:        Patient has prior history of Echocardiogram examinations, most                 recent 01/18/2018. Signs/Symptoms:Altered Mental Status and                 Murmur; Risk Factors:Hypertension.  Sonographer:    Dustin Flock Referring Phys: [phone] [place] Comments: Image acquisition challenging due to uncooperative patient. IMPRESSIONS  1. Left ventricular ejection fraction, by estimation, is 65 to 70%. The left ventricle has hyperdynamic function. The left ventricle has no regional wall motion abnormalities. There is moderate left ventricular hypertrophy. There was mitral valve systolic anterior motion noted with LV outflow gradient, peak 58 mmHg. Left ventricular diastolic parameters are consistent with Grade I diastolic dysfunction (impaired relaxation). 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  2. Right ventricular systolic function is normal. The right ventricular size is normal. Tricuspid regurgitation signal is inadequate for assessing PA pressure.  3. The mitral valve is normal in structure and function. Mild mitral valve regurgitation (not well-visualized). As above, systolic anterior motion of the mitral valve was noted. No evidence of mitral stenosis.  4. The aortic valve is tricuspid. Aortic valve regurgitation is not visualized. Mild aortic valve sclerosis is present, with no evidence of aortic valve stenosis.  5. The inferior vena cava is normal in size with <50% respiratory variability, suggesting right atrial pressure of 8 mmHg. FINDINGS  Left Ventricle: Left ventricular ejection fraction, by estimation, is 65 to 70%. The left ventricle has hyperdynamic function. The left  ventricle has no regional wall motion abnormalities. The left ventricular internal cavity size was normal in size. There is moderate left ventricular hypertrophy. Left ventricular diastolic parameters are consistent with Grade I diastolic dysfunction (impaired relaxation). Right Ventricle: The right ventricular size is normal. No increase in right ventricular wall thickness. Right ventricular systolic function is normal. Tricuspid regurgitation signal is inadequate for assessing PA pressure. Left Atrium: Left atrial size was normal in size. Right Atrium: Right atrial size was normal in size. Pericardium: A small pericardial effusion is present. Mitral Valve: The mitral valve is normal in structure and function. There is mild calcification of the mitral valve leaflet(s). Mild mitral annular calcification. Mild mitral valve regurgitation. No evidence of mitral valve stenosis. Tricuspid Valve: The  tricuspid valve is normal in structure. Tricuspid valve regurgitation is not demonstrated. Aortic Valve: The aortic valve is tricuspid. Aortic valve regurgitation is not visualized. Mild aortic valve sclerosis is present, with no evidence of aortic valve stenosis. Pulmonic Valve: The pulmonic valve was normal in structure. Pulmonic valve regurgitation is trivial. Aorta: The aortic root is normal in size and structure. Venous: The inferior vena cava is normal in size with less than 50% respiratory variability, suggesting right atrial pressure of 8 mmHg. IAS/Shunts: No atrial level shunt detected by color flow Doppler.  LEFT VENTRICLE PLAX 2D LVIDd:         2.50 cm  Diastology LVIDs:         1.60 cm  LV e' lateral:   6.09 cm/s LV PW:         1.30 cm  LV E/e' lateral: 14.7 LV IVS:        1.30 cm  LV e' medial:    9.68 cm/s LVOT diam:     1.80 cm  LV E/e' medial:  9.2 LV SV:         83 LV SV Index:   49 LVOT Area:     2.54 cm  RIGHT VENTRICLE RV Basal diam:  1.80 cm RV S prime:     5.11 cm/s TAPSE (M-mode): 2.7 cm LEFT ATRIUM              Index       RIGHT ATRIUM          Index LA diam:        2.30 cm 1.35 cm/m  RA Area:     8.27 cm LA Vol (A2C):   18.6 ml 10.93 ml/m RA Volume:   16.80 ml 9.87 ml/m LA Vol (A4C):   35.0 ml 20.56 ml/m LA Biplane Vol: 26.2 ml 15.39 ml/m  AORTIC VALVE LVOT Vmax:   187.00 cm/s LVOT Vmean:  114.000 cm/s LVOT VTI:    0.327 m  AORTA Ao Root diam: 2.60 cm MITRAL VALVE MV Area (PHT): 2.39 cm     SHUNTS MV Decel Time: 317 msec     Systemic VTI:  0.33 m MV E velocity: 89.50 cm/s   Systemic Diam: 1.80 cm MV A velocity: 122.00 cm/s MV E/A ratio:  0.73 Marca Ancona MD Electronically signed by Marca Ancona MD Signature Date/Time: 12/30/2019/12:08:11 PM    Final    [place] EKG SITE RITE  Result Date: 12/29/2019 If Site Rite image not attached, placement could not be confirmed due to current cardiac rhythm.   Anti-infectives: Anti-infectives (From admission, onward)   None       Assessment/Plan HTN HLD  SBO, likely 2/2 adhesions - prior h/o tubal ligation and laparoscopic cholecystectomy 09/05/2019 - CT scan shows high-grade small bowel obstruction with complete decompression of distal ileum and near complete decompression of the colon, transition point in the central pelvis likely due to adhesions - Pt removed NGT overnight and did not get gastrografin - BM recorded overnight, repeat abdominal film this AM - if film improved, will start CLD with bowel regimen - if film same or worsened - NGT replacement with gastrografin vs PO gastrografin - if patient becomes more nauseated and having vomiting would recommend replacement of NGT to help mitigate aspiration risk  ID - none VTE - SCDs, sq heparin FEN - IVF, NPO Foley - none Follow up - TBD  LOS: 1 day    Wells Guiles , [facility] 12/30/2019, 12:13 PM  Please see Amion for pager number during day hours 7:00am-4:30pm

## 2019-12-30 NOTE — Progress Notes (Signed)
Patient assessed this AM after contrast extravasation yesterday to R antecubital area.  Arm with pain, swelling, tenderness. ROM intact.  Moving arm, hands, and fingers without difficulty.   Contact radiology if concerns presents.  Can continue measure for arm comfort as needed.  Loyce Dys, MS RD PA-C

## 2019-12-30 NOTE — Progress Notes (Signed)
Confirmed with Dr. Fatima Blank to administer 4 runs of potassium.

## 2019-12-30 NOTE — Progress Notes (Signed)
  FPTS Interim Progress Note  S: Patient called out wishing to speak to physician about her pain. She is adamant that the nurses are not giving her her pain meds. Patient does have pain meds on board, most recently morphine 4mg  q3h.   O: BP (!) 156/69   Pulse 83   Temp 99.2 F (37.3 C) (Oral)   Resp 12   Wt 53.5 kg   SpO2 99%   BMI 16.00 kg/m   Abdominal: soft, voluntary guarding, tender to palpation in epigastric and LUQ. Neuro: PERRLA, EOMI, Alert and oriented to person, place (hospital, not [place] or [place]), not oriented to time or circumstance. Able to do serial 7s, not able to spell world backwards (pt states that she doesn't know her letters).  A/P: Patient still having pain despite 4mg  morphine at 1516. She is telling the nurses that they are not giving her medicine, but water into her IV. Suspect pt has underlying dementia made worse by being acutely ill. However, she is objectively in pain. She may not be a good metabolizer of morphine. Will try dilaudid 0.5 mg q3 hr (equivalent to morphine 4 mg q3h). Spoke with patient's daughter, , who is main point of contact. is agreeable to this plan.  [name], MD 12/30/2019, 6:18 PM PGY-1, [facility] Service pager [phone]

## 2019-12-30 NOTE — Plan of Care (Signed)

## 2019-12-31 ENCOUNTER — Ambulatory Visit: Payer: Medicare PPO

## 2019-12-31 ENCOUNTER — Inpatient Hospital Stay (HOSPITAL_COMMUNITY): Payer: Medicare PPO

## 2019-12-31 LAB — BASIC METABOLIC PANEL
Anion gap: 12 (ref 5–15)
Anion gap: 9 (ref 5–15)
BUN: 12 mg/dL (ref 8–23)
BUN: 6 mg/dL — ABNORMAL LOW (ref 8–23)
CO2: 24 mmol/L (ref 22–32)
CO2: 26 mmol/L (ref 22–32)
Calcium: 8.5 mg/dL — ABNORMAL LOW (ref 8.9–10.3)
Calcium: 8.2 mg/dL — ABNORMAL LOW (ref 8.9–10.3)
Chloride: 104 mmol/L (ref 98–111)
Chloride: 102 mmol/L (ref 98–111)
Creatinine, Ser: 0.67 mg/dL (ref 0.44–1.00)
Creatinine, Ser: 0.51 mg/dL (ref 0.44–1.00)
GFR calc Af Amer: >60 mL/min (ref >60)
GFR calc Af Amer: >60 mL/min (ref >60)
GFR calc non Af Amer: >60 mL/min (ref >60)
GFR calc non Af Amer: >60 mL/min (ref >60)
Glucose, Bld: 88 mg/dL (ref 70–99)
Glucose, Bld: 116 mg/dL — ABNORMAL HIGH (ref 70–99)
Potassium: 2.7 mmol/L — CL (ref 3.5–5.1)
Potassium: 3.3 mmol/L — ABNORMAL LOW (ref 3.5–5.1)
Sodium: 140 mmol/L (ref 135–145)
Sodium: 137 mmol/L (ref 135–145)

## 2019-12-31 LAB — MAGNESIUM: Magnesium: 1.9 mg/dL (ref 1.7–2.4)

## 2019-12-31 MED ORDER — POTASSIUM CHLORIDE IN NACL 40-0.9 MEQ/L-% IV SOLN
INTRAVENOUS | Status: DC
  Administered 2019-12-31 – 2020-01-01 (×3): 100 mL/h via INTRAVENOUS
  Filled 2019-12-31 (×3): qty 1000

## 2019-12-31 MED ORDER — POTASSIUM CHLORIDE 10 MEQ/50ML IV SOLN
10.0000 meq | INTRAVENOUS | Status: AC
  Administered 2019-12-31 (×6): 10 meq via INTRAVENOUS
  Filled 2019-12-31 (×6): qty 50

## 2019-12-31 MED ORDER — ROSUVASTATIN CALCIUM 20 MG PO TABS
20.0000 mg | ORAL_TABLET | Freq: Every day | ORAL | Status: DC
  Administered 2020-01-01 – 2020-01-03 (×3): 20 mg via ORAL
  Filled 2019-12-31 (×3): qty 1

## 2019-12-31 MED ORDER — AMLODIPINE BESYLATE 10 MG PO TABS
10.0000 mg | ORAL_TABLET | Freq: Every day | ORAL | Status: DC
  Administered 2020-01-01 – 2020-01-03 (×3): 10 mg via ORAL
  Filled 2019-12-31 (×3): qty 1

## 2019-12-31 NOTE — Progress Notes (Signed)
Subjective/Chief Complaint: Patient reports a very large loose bowel movement yesterday. She seems irritable for some reason, although this is my first time meeting her. She continues to complain about abdominal pain and asked for pain medication at least four times during my interaction with her.  She was switched to Dilaudid last night by the primary team.  She insists "I'm not a junkie" despite the fact that I didn't mention her volume of pain medication.   Objective: Vital signs in last 24 hours: Temp:  [98.1 F (36.7 C)-99.2 F (37.3 C)] 98.1 F (36.7 C) (03/06 0528) Pulse Rate:  [73-83] 73 (03/06 0528) Resp:  [12-16] 16 (03/06 0528) BP: (145-156)/(61-73) 153/71 (03/06 0528) SpO2:  [99 %-100 %] 100 % (03/06 0528) Last BM Date: 12/30/19  Intake/Output from previous day: 03/05 0701 - 03/06 0700 In: 150 [P.O.:150] Out: -  Intake/Output this shift: No intake/output data recorded.  General appearance: alert, cooperative and no distress GI: thin, soft, non-distended, describes tenderness in both lower quadrants.  No guarding, no peritoneal signs. No palpable masses  Lab Results:  Recent Labs    12/29/19 0421 12/30/19 0048  WBC 14.7* 9.9  HGB 13.9 12.8  HCT 40.3 37.3  PLT 319 282   BMET Recent Labs    12/30/19 0048 12/30/19 1144  NA 137 141  K 3.3* 3.0*  CL 101 106  CO2 24 25  GLUCOSE 86 83  BUN 26* 19  CREATININE 0.85 0.72  CALCIUM 8.8* 8.6*   PT/INR No results for input(s): LABPROT, INR in the last 72 hours. ABG No results for input(s): PHART, HCO3 in the last 72 hours.  Invalid input(s): PCO2, PO2  Studies/Results: CT ABDOMEN PELVIS W CONTRAST  Result Date: 12/29/2019 CLINICAL DATA:  Abdominal pain for 2 weeks with vomiting. History of gallbladder surgery 2 months ago EXAM: CT ABDOMEN AND PELVIS WITH CONTRAST TECHNIQUE: Multidetector CT imaging of the abdomen and pelvis was performed using the standard protocol following bolus administration of  intravenous contrast. CONTRAST:  OMNIPAQUE IOHEXOL 300 MG/ML  SOLN COMPARISON:  10/27/2019 FINDINGS: Lower chest: Patchy nodular pattern in the right middle lobe is new compared to previous studies. No signs of pleural effusion. Hepatobiliary: No signs of suspicious focal liver lesion. Cyst in the right hepatic lobe is unchanged. Other small low-density foci and focal fat intensive occasion about the gallbladder fossa are stable findings. The portal vein is patent. Post cholecystectomy with pneumobilia and biliary ductal dilation, duct is dilated to approximately 12 mm previously approximately 9 mm on 10/27/2019. The amount of pneumobilia is increased in the setting of a small bowel obstruction, see below. Pancreas: Mild increase in pancreatic ductal distension since the previous study but with 3 mm dilation of main pancreatic duct. No signs of peripancreatic inflammation. Spleen: Spleen is normal in size and without focal lesion. Adrenals/Urinary Tract: Adrenal glands are normal. Signs of renal cysts bilaterally. No signs of hydronephrosis. Urinary bladder is normal. Stomach/Bowel: Diffuse small bowel dilation including dilation of duodenum extending back into the stomach which is markedly dilated. Bowel loops are dilated to the upper central pelvis where there is a transition point and complete decompression of bowel loops beyond the transition point. Suspected area of transition (image 69, series 3) also seen on coronal images 96 through 88 where there is abrupt narrowing of the small bowel loops in the pelvis. This is likely proximal to mid ileum. The appendix is normal. Stool filling the colon partially which is mainly under distended. Vascular/Lymphatic:  Extensive atherosclerotic changes throughout the abdominal aorta, no signs of aneurysm. Vascular atherosclerotic changes track into the iliac vessels. No signs of retroperitoneal adenopathy. No upper abdominal lymphadenopathy. No signs of pelvic  adenopathy. Reproductive: Uterus grossly normal. Bowel loops which are dilated surround the uterus and adnexa. No signs of adnexal mass. Other: No signs of free air. No signs of pneumatosis. No visible hernia. Musculoskeletal: Spinal degenerative changes without acute or destructive bone process. IMPRESSION: 1. High-grade small bowel obstruction with complete decompression of distal ileum and near complete decompression of the colon, transition point in the central pelvis likely due to adhesions. 2. Post cholecystectomy with pneumobilia and biliary ductal dilation, correlate with any signs of cholangitis though given recent sphincterotomy and marked bowel distension findings could be explained in the setting of small bowel obstruction. 3. Mild increase in pancreatic ductal distension since the previous study, attention on follow-up. 4. New patchy nodular pattern in the right middle lobe. Findings may represent pneumonitis, perhaps from aspiration. Correlate with nausea and vomiting. 5. Extensive atherosclerotic changes of the abdominal aorta. 6. Bilateral renal cysts. Aortic Atherosclerosis (ICD10-I70.0). Electronically Signed   By: Zetta Bills M.D.   On: 12/29/2019 09:55   DG Abd Portable 1V  Result Date: 12/30/2019 CLINICAL DATA:  Abdominal pain. EXAM: PORTABLE ABDOMEN - 1 VIEW COMPARISON:  12/29/2019. FINDINGS: Surgical clips right upper quadrant. Previously identified NG tube no longer visualized. No gastric distention. Stable bowel distention. No free air. Air is again noted in the biliary system. Degenerative change thoracolumbar spine. IMPRESSION: 1.  NG tube no longer visualized. 2. No gastric distention. Persistent bowel distention, reference is made to prior CT report of 12/29/2019. 3.  Pneumobilia again noted consistent with prior sphincterotomy. Electronically Signed   By: Marcello Moores  Register   On: 12/30/2019 12:54   DG Abd Portable 1V-Small Bowel Protocol-Position Verification  Result Date:  12/29/2019 CLINICAL DATA:  NG tube placement. EXAM: PORTABLE ABDOMEN - 1 VIEW COMPARISON:  CT 12/29/2019. FINDINGS: NG tube tip noted over the upper portion of the stomach. NG tube side hole noted just above the gastroesophageal junction. Advancement of the NG tube approximately 10 cm should be considered. Small-bowel distention improved. No free intraperitoneal air noted. Air in the biliary system is again noted, reference is made to prior CT report 12/29/2019. Contrast in the renal collecting system from prior CT. No acute bony abnormality identified. Tiny sclerotic density noted over the right ilium consistent with small bone island. IMPRESSION: 1. NG tube noted with its tip over the upper most portion of the stomach. NG tube side hole is just above the gastroesophageal junction. NG tube advancement of approximately 15 cm should be considered. 2. Interim improvement in bowel distention. No free air. Air is again noted the biliary system, most likely from prior sphincterotomy, reference is made to prior CT report of 12/29/2019. Electronically Signed   By: Marcello Moores  Register   On: 12/29/2019 14:34   ECHOCARDIOGRAM COMPLETE  Result Date: 12/30/2019    ECHOCARDIOGRAM REPORT   Patient Name:   Samantha Gonzales Date of Exam: 12/30/2019 Medical Rec #:  [id number]       Height:       72.0 in Accession #:    [id number]      Weight:       117.9 lb Date of Birth:  01/23/1941       BSA:          1.702 m Patient Age:    51 years  BP:           146/66 mmHg Patient Gender: F               HR:           92 bpm. Exam Location:  Inpatient Procedure: 2D Echo, Cardiac Doppler and Color Doppler Indications:    Murmur  History:        Patient has prior history of Echocardiogram examinations, most                 recent 01/18/2018. Signs/Symptoms:Altered Mental Status and                 Murmur; Risk Factors:Hypertension.  Sonographer:    Lavenia Atlas Referring Phys: [phone] SARA L NEAL  Sonographer Comments: Image acquisition  challenging due to uncooperative patient. IMPRESSIONS  1. Left ventricular ejection fraction, by estimation, is 65 to 70%. The left ventricle has hyperdynamic function. The left ventricle has no regional wall motion abnormalities. There is moderate left ventricular hypertrophy. There was mitral valve systolic anterior motion noted with LV outflow gradient, peak 58 mmHg. Left ventricular diastolic parameters are consistent with Grade I diastolic dysfunction (impaired relaxation). 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  2. Right ventricular systolic function is normal. The right ventricular size is normal. Tricuspid regurgitation signal is inadequate for assessing PA pressure.  3. The mitral valve is normal in structure and function. Mild mitral valve regurgitation (not well-visualized). As above, systolic anterior motion of the mitral valve was noted. No evidence of mitral stenosis.  4. The aortic valve is tricuspid. Aortic valve regurgitation is not visualized. Mild aortic valve sclerosis is present, with no evidence of aortic valve stenosis.  5. The inferior vena cava is normal in size with <50% respiratory variability, suggesting right atrial pressure of 8 mmHg. FINDINGS  Left Ventricle: Left ventricular ejection fraction, by estimation, is 65 to 70%. The left ventricle has hyperdynamic function. The left ventricle has no regional wall motion abnormalities. The left ventricular internal cavity size was normal in size. There is moderate left ventricular hypertrophy. Left ventricular diastolic parameters are consistent with Grade I diastolic dysfunction (impaired relaxation). Right Ventricle: The right ventricular size is normal. No increase in right ventricular wall thickness. Right ventricular systolic function is normal. Tricuspid regurgitation signal is inadequate for assessing PA pressure. Left Atrium: Left  atrial size was normal in size. Right Atrium: Right atrial size was normal in size. Pericardium: A small pericardial effusion is present. Mitral Valve: The mitral valve is normal in structure and function. There is mild calcification of the mitral valve leaflet(s). Mild mitral annular calcification. Mild mitral valve regurgitation. No evidence of mitral valve stenosis. Tricuspid Valve: The tricuspid valve is normal in structure. Tricuspid valve regurgitation is not demonstrated. Aortic Valve: The aortic valve is tricuspid. Aortic valve regurgitation is not visualized. Mild aortic valve sclerosis is present, with no evidence of aortic valve stenosis. Pulmonic Valve: The pulmonic valve was normal in structure. Pulmonic valve regurgitation is trivial. Aorta: The aortic root is normal in size and structure. Venous: The inferior vena cava is normal in size with less than 50% respiratory variability, suggesting right atrial pressure of 8 mmHg. IAS/Shunts: No atrial level shunt detected by color flow Doppler.  LEFT VENTRICLE PLAX 2D LVIDd:         2.50 cm  Diastology LVIDs:         1.60 cm  LV e'  lateral:   6.09 cm/s LV PW:         1.30 cm  LV E/e' lateral: 14.7 LV IVS:        1.30 cm  LV e' medial:    9.68 cm/s LVOT diam:     1.80 cm  LV E/e' medial:  9.2 LV SV:         83 LV SV Index:   49 LVOT Area:     2.54 cm  RIGHT VENTRICLE RV Basal diam:  1.80 cm RV S prime:     5.11 cm/s TAPSE (M-mode): 2.7 cm LEFT ATRIUM             Index       RIGHT ATRIUM          Index LA diam:        2.30 cm 1.35 cm/m  RA Area:     8.27 cm LA Vol (A2C):   18.6 ml 10.93 ml/m RA Volume:   16.80 ml 9.87 ml/m LA Vol (A4C):   35.0 ml 20.56 ml/m LA Biplane Vol: 26.2 ml 15.39 ml/m  AORTIC VALVE LVOT Vmax:   187.00 cm/s LVOT Vmean:  114.000 cm/s LVOT VTI:    0.327 m  AORTA Ao Root diam: 2.60 cm MITRAL VALVE MV Area (PHT): 2.39 cm     SHUNTS MV Decel Time: 317 msec     Systemic VTI:  0.33 m MV E velocity: 89.50 cm/s   Systemic Diam: 1.80 cm MV  A velocity: 122.00 cm/s MV E/A ratio:  0.73 Marca Ancona MD Electronically signed by Marca Ancona MD Signature Date/Time: 12/30/2019/12:08:11 PM    Final    [place] EKG SITE RITE  Result Date: 12/29/2019 If Site Rite image not attached, placement could not be confirmed due to current cardiac rhythm.   Anti-infectives: Anti-infectives (From admission, onward)   None      Assessment/Plan: HTN HLD  SBO, likely 2/2 adhesions -prior h/o tubal ligation and laparoscopic cholecystectomy 09/05/2019 - CT scan shows high-grade small bowel obstruction with complete decompression of distal ileum and near complete decompression of the colon, transition point in the central pelvis likely due to adhesions - Patient had a very large bowel movement yesterday and abdomen is non-distended  ID -none VTE -SCDs, sq heparin FEN -IVF, Advance diet to full liquids Foley -none Follow up -TBD  LOS: 2 days    Wynona Luna 12/31/2019

## 2019-12-31 NOTE — Progress Notes (Signed)
Family Medicine Teaching Service Daily Progress Note Intern Pager: [phone]  Patient name: Samantha Gonzales Medical record number: [id number] Date of birth: 09/29/1941 Age: 78 y.o. Gender: female  Primary Care Provider: System, Pcp Not In Consultants: Surgery Code Status: Full code  Pt Overview and Major Events to Date:  3/4 patient admitted for small bowel obstruction  Assessment and Plan: Samantha Gonzales is a 79 y.o. female presenting with 4-week history of progressive abdominal pain.  CT scan consistent with small bowel obstruction.  SBO 2/2 adhesion- Tolerating liquid diet and had regular BM yesterday, per patient. She seems comfortable at baseline and while palpating abdomen when distracted with conversation. Abdomen is soft/non-distended. She asks for pain meds and complains that her nurses are not giving it to her (she is not currently due for next PRN dose). Surgery is following and recommends advance to full liquid diet today which she is tolerating and would like more of a diet. - f/u gen surg recs - continue to advance diet as tolerated - continue bowel regimen - continue PRN pain regimen of dilaudid q3hr although opioid use may be exacerbating problem  Hypokalemia, intractable  K+ 2.7 today.  - IV K+ run of plus adding to maintenance fluids.  - recheck BMP at 1600 and replete as needed. - added on Mg to previous collection and will f/u, replete PRN (2.3 yesterday)  HTN with murmur Echo showed EF 65-70% with LVH and mild MVR. Remains hypertensive as home amlodipine has been held with NPO status. - restart amlodipine 10mg  today  Hyperlipidemia - restart home rosuvastatin   FEN/GI: full liquid diet PPx: Heparin subcu  Disposition: PT/OT recommended home therapy with 24hr supervision or SNF if that is not available at home  Subjective:  Patient alert and comfortable appearing. She complains of her phone missing and not being able to eat. She has repetitive  speech and some confusion which appears to be her baseline. Does not complain of abdominal pain but does complain that she's not getting enough pain medication. Denies N/V. Last BM yesterday and normal for her.  Objective: Temp:  [98.1 F (36.7 C)-99.2 F (37.3 C)] 98.1 F (36.7 C) (03/06 0528) Pulse Rate:  [73-83] 73 (03/06 0528) Resp:  [12-16] 16 (03/06 0528) BP: (147-156)/(69-73) 153/71 (03/06 0528) SpO2:  [99 %-100 %] 100 % (03/06 0528) Physical Exam: General: NAD, laying in bed Cardiovascular: RRR, systolic murmur heard best at right sternal border Respiratory: Normal work of breathing, clear to auscultation bilaterally, Abdomen: soft, non-distended. No tenderness when palpating while talking to patient. Then has some discomfort in RUQ when asked about the pain.  Extremities: No edema noted Derm: dry, warm, no rashes noted  Laboratory: Recent Labs  Lab 12/29/19 0421 12/30/19 0048  WBC 14.7* 9.9  HGB 13.9 12.8  HCT 40.3 37.3  PLT 319 282   Recent Labs  Lab 12/29/19 0421 12/29/19 0421 12/30/19 0048 12/30/19 1144 12/31/19 0826  NA 135   < > 137 141 140  K 2.6*   < > 3.3* 3.0* 2.7*  CL 93*   < > 101 106 104  CO2 25   < > 24 25 24   BUN 25*   < > 26* 19 12  CREATININE 0.87   < > 0.85 0.72 0.67  CALCIUM 9.9   < > 8.8* 8.6* 8.5*  PROT 7.3  --  6.1*  --   --   BILITOT 0.9  --  1.3*  --   --  ALKPHOS 56  --  46  --   --   ALT 12  --  14  --   --   AST 26  --  30  --   --   GLUCOSE 132*   < > 86 83 88   < > = values in this interval not displayed.   Troponin-52> 48  Imaging/Diagnostic Tests: DG Abd Portable 1V  Result Date: 12/31/2019 CLINICAL DATA:  Follow-up small bowel obstruction. EXAM: PORTABLE ABDOMEN - 1 VIEW COMPARISON:  12/30/2019 and earlier, including CT abdomen and pelvis 12/29/2019. FINDINGS: Moderate distention of the small bowel, unchanged, with normal caliber distal ileum. Normal caliber colon with expected stool burden. No suggestion of free air on  the supine image. Surgical clips in the RIGHT UPPER QUADRANT prior cholecystectomy. Aortoiliac atherosclerosis without evidence of aneurysm. IMPRESSION: Stable partial small bowel obstruction. Electronically Signed   By: Evangeline Dakin M.D.   On: 12/31/2019 09:43   DG Abd Portable 1V  Result Date: 12/30/2019 CLINICAL DATA:  Abdominal pain. EXAM: PORTABLE ABDOMEN - 1 VIEW COMPARISON:  12/29/2019. FINDINGS: Surgical clips right upper quadrant. Previously identified NG tube no longer visualized. No gastric distention. Stable bowel distention. No free air. Air is again noted in the biliary system. Degenerative change thoracolumbar spine. IMPRESSION: 1.  NG tube no longer visualized. 2. No gastric distention. Persistent bowel distention, reference is made to prior CT report of 12/29/2019. 3.  Pneumobilia again noted consistent with prior sphincterotomy. Electronically Signed   By: Marcello Moores  Register   On: 12/30/2019 12:54     Richarda Osmond, DO 12/31/2019, 10:12 AM PGY-2, [place] Intern pager: [phone], text pages welcome

## 2020-01-01 ENCOUNTER — Inpatient Hospital Stay (HOSPITAL_COMMUNITY): Payer: Medicare PPO

## 2020-01-01 LAB — BASIC METABOLIC PANEL
Anion gap: 7 (ref 5–15)
BUN: <5 mg/dL — ABNORMAL LOW (ref 8–23)
CO2: 26 mmol/L (ref 22–32)
Calcium: 8.3 mg/dL — ABNORMAL LOW (ref 8.9–10.3)
Chloride: 104 mmol/L (ref 98–111)
Creatinine, Ser: 0.53 mg/dL (ref 0.44–1.00)
GFR calc Af Amer: >60 mL/min (ref >60)
GFR calc non Af Amer: >60 mL/min (ref >60)
Glucose, Bld: 104 mg/dL — ABNORMAL HIGH (ref 70–99)
Potassium: 3.7 mmol/L (ref 3.5–5.1)
Sodium: 137 mmol/L (ref 135–145)

## 2020-01-01 LAB — MAGNESIUM: Magnesium: 1.6 mg/dL — ABNORMAL LOW (ref 1.7–2.4)

## 2020-01-01 MED ORDER — HYDROMORPHONE HCL 1 MG/ML IJ SOLN
1.0000 mg | INTRAMUSCULAR | Status: DC | PRN
  Administered 2020-01-01 – 2020-01-02 (×7): 1 mg via INTRAVENOUS
  Filled 2020-01-01 (×7): qty 1

## 2020-01-01 MED ORDER — MAGNESIUM SULFATE 2 GM/50ML IV SOLN
2.0000 g | Freq: Once | INTRAVENOUS | Status: AC
  Administered 2020-01-01: 2 g via INTRAVENOUS
  Filled 2020-01-01: qty 50

## 2020-01-01 MED ORDER — HYDROMORPHONE HCL 1 MG/ML IJ SOLN
0.5000 mg | Freq: Once | INTRAMUSCULAR | Status: AC
  Administered 2020-01-01: 0.5 mg via INTRAVENOUS
  Filled 2020-01-01: qty 1

## 2020-01-01 MED ORDER — ACETAMINOPHEN 325 MG PO TABS
650.0000 mg | ORAL_TABLET | Freq: Four times a day (QID) | ORAL | Status: DC
  Administered 2020-01-02 – 2020-01-03 (×5): 650 mg via ORAL
  Filled 2020-01-01 (×5): qty 2

## 2020-01-01 MED ORDER — SODIUM CHLORIDE 0.9 % IV SOLN: INTRAVENOUS | Status: DC

## 2020-01-01 NOTE — Progress Notes (Signed)
Subjective/Chief Complaint: Having bowel function.  Appears to be tolerating some of her soft diet  Objective: Vital signs in last 24 hours: Temp:  [97.6 F (36.4 C)-98.7 F (37.1 C)] 98.7 F (37.1 C) (03/07 0456) Pulse Rate:  [79-86] 79 (03/07 0456) Resp:  [16-18] 17 (03/07 0456) BP: (153-161)/(72-80) 153/80 (03/07 0456) SpO2:  [100 %] 100 % (03/07 0456) Last BM Date: 12/30/19  Intake/Output from previous day: 03/06 0701 - 03/07 0700 In: 450 [P.O.:450] Out: -  Intake/Output this shift: Total I/O In: 240 [P.O.:240] Out: -   General appearance: alert, cooperative and no distress GI: thin, soft, non-distended, describes tenderness in both lower quadrants.  No guarding, no peritoneal signs. No palpable masses  Lab Results:  Recent Labs    12/30/19 0048  WBC 9.9  HGB 12.8  HCT 37.3  PLT 282   BMET Recent Labs    12/31/19 0826 12/31/19 2022  NA 140 137  K 2.7* 3.3*  CL 104 102  CO2 24 26  GLUCOSE 88 116*  BUN 12 6*  CREATININE 0.67 0.51  CALCIUM 8.5* 8.2*   PT/INR No results for input(s): LABPROT, INR in the last 72 hours. ABG No results for input(s): PHART, HCO3 in the last 72 hours.  Invalid input(s): PCO2, PO2  Studies/Results: DG Abd 1 View  Result Date: 01/01/2020 CLINICAL DATA:  Small-bowel obstruction. EXAM: ABDOMEN - 1 VIEW COMPARISON:  12/31/2019; 12/30/2019; 12/29/2019; CT abdomen pelvis-12/29/2019 FINDINGS: Moderate gaseous distention of the stomach. Moderate gaseous distension of several centralized loops of small bowel with index loop of small bowel in the left mid hemiabdomen measuring 2.5 cm in diameter, similar to the 12/31/2018 examination, though progressed compared to the 3/5 and 3/4 examinations. These findings are again associated with a conspicuous paucity of distal colonic gas. Nondiagnostic evaluation for pneumoperitoneum secondary to supine positioning and exclusion of the lower thorax. No pneumatosis or portal venous gas. Post  cholecystectomy. No acute or aggressive osseous abnormalities. Degenerative change of the lower lumbar spine and bilateral hips is suspected though incompletely evaluated. Atherosclerotic plaque within the abdominal aorta. IMPRESSION: Findings worrisome for small bowel obstruction, similar to the 3/6 examination, though progressed compared to the 03/05 and 3/4 exams. Consideration for appropriateness of decompressive NG tube placement could be performed as indicated. Electronically Signed   By: Simonne Come M.D.   On: 01/01/2020 08:31   DG Abd Portable 1V  Result Date: 12/31/2019 CLINICAL DATA:  Follow-up small bowel obstruction. EXAM: PORTABLE ABDOMEN - 1 VIEW COMPARISON:  12/30/2019 and earlier, including CT abdomen and pelvis 12/29/2019. FINDINGS: Moderate distention of the small bowel, unchanged, with normal caliber distal ileum. Normal caliber colon with expected stool burden. No suggestion of free air on the supine image. Surgical clips in the RIGHT UPPER QUADRANT prior cholecystectomy. Aortoiliac atherosclerosis without evidence of aneurysm. IMPRESSION: Stable partial small bowel obstruction. Electronically Signed   By: Hulan Saas M.D.   On: 12/31/2019 09:43   DG Abd Portable 1V  Result Date: 12/30/2019 CLINICAL DATA:  Abdominal pain. EXAM: PORTABLE ABDOMEN - 1 VIEW COMPARISON:  12/29/2019. FINDINGS: Surgical clips right upper quadrant. Previously identified NG tube no longer visualized. No gastric distention. Stable bowel distention. No free air. Air is again noted in the biliary system. Degenerative change thoracolumbar spine. IMPRESSION: 1.  NG tube no longer visualized. 2. No gastric distention. Persistent bowel distention, reference is made to prior CT report of 12/29/2019. 3.  Pneumobilia again noted consistent with prior sphincterotomy. Electronically Signed  ByMaisie Fus  Register   On: 12/30/2019 12:54   ECHOCARDIOGRAM COMPLETE  Result Date: 12/30/2019    ECHOCARDIOGRAM REPORT   Patient  Name:   Samantha Gonzales Date of Exam: 12/30/2019 Medical Rec #:  [id number]       Height:       72.0 in Accession #:    [id number]      Weight:       117.9 lb Date of Birth:  Mar 01, 1941       BSA:          1.702 m Patient Age:    79 years        BP:           146/66 mmHg Patient Gender: F               HR:           92 bpm. Exam Location:  Inpatient Procedure: 2D Echo, Cardiac Doppler and Color Doppler Indications:    Murmur  History:        Patient has prior history of Echocardiogram examinations, most                 recent 01/18/2018. Signs/Symptoms:Altered Mental Status and                 Murmur; Risk Factors:Hypertension.  Sonographer:    Lavenia Atlas Referring Phys: [phone] SARA L NEAL  Sonographer Comments: Image acquisition challenging due to uncooperative patient. IMPRESSIONS  1. Left ventricular ejection fraction, by estimation, is 65 to 70%. The left ventricle has hyperdynamic function. The left ventricle has no regional wall motion abnormalities. There is moderate left ventricular hypertrophy. There was mitral valve systolic anterior motion noted with LV outflow gradient, peak 58 mmHg. Left ventricular diastolic parameters are consistent with Grade I diastolic dysfunction (impaired relaxation). 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  2. Right ventricular systolic function is normal. The right ventricular size is normal. Tricuspid regurgitation signal is inadequate for assessing PA pressure.  3. The mitral valve is normal in structure and function. Mild mitral valve regurgitation (not well-visualized). As above, systolic anterior motion of the mitral valve was noted. No evidence of mitral stenosis.  4. The aortic valve is tricuspid. Aortic valve regurgitation is not visualized. Mild aortic valve sclerosis is present, with no evidence of aortic valve stenosis.  5. The inferior vena cava is normal in  size with <50% respiratory variability, suggesting right atrial pressure of 8 mmHg. FINDINGS  Left Ventricle: Left ventricular ejection fraction, by estimation, is 65 to 70%. The left ventricle has hyperdynamic function. The left ventricle has no regional wall motion abnormalities. The left ventricular internal cavity size was normal in size. There is moderate left ventricular hypertrophy. Left ventricular diastolic parameters are consistent with Grade I diastolic dysfunction (impaired relaxation). Right Ventricle: The right ventricular size is normal. No increase in right ventricular wall thickness. Right ventricular systolic function is normal. Tricuspid regurgitation signal is inadequate for assessing PA pressure. Left Atrium: Left atrial size was normal in size. Right Atrium: Right atrial size was normal in size. Pericardium: A small pericardial effusion is present. Mitral Valve: The mitral valve is normal in structure and function. There is mild calcification of the mitral valve leaflet(s). Mild mitral annular calcification. Mild mitral valve regurgitation. No evidence of mitral valve stenosis. Tricuspid Valve: The tricuspid valve is normal in structure. Tricuspid valve regurgitation is not demonstrated. Aortic Valve:  The aortic valve is tricuspid. Aortic valve regurgitation is not visualized. Mild aortic valve sclerosis is present, with no evidence of aortic valve stenosis. Pulmonic Valve: The pulmonic valve was normal in structure. Pulmonic valve regurgitation is trivial. Aorta: The aortic root is normal in size and structure. Venous: The inferior vena cava is normal in size with less than 50% respiratory variability, suggesting right atrial pressure of 8 mmHg. IAS/Shunts: No atrial level shunt detected by color flow Doppler.  LEFT VENTRICLE PLAX 2D LVIDd:         2.50 cm  Diastology LVIDs:         1.60 cm  LV e' lateral:   6.09 cm/s LV PW:         1.30 cm  LV E/e' lateral: 14.7 LV IVS:        1.30 cm  LV e'  medial:    9.68 cm/s LVOT diam:     1.80 cm  LV E/e' medial:  9.2 LV SV:         83 LV SV Index:   49 LVOT Area:     2.54 cm  RIGHT VENTRICLE RV Basal diam:  1.80 cm RV S prime:     5.11 cm/s TAPSE (M-mode): 2.7 cm LEFT ATRIUM             Index       RIGHT ATRIUM          Index LA diam:        2.30 cm 1.35 cm/m  RA Area:     8.27 cm LA Vol (A2C):   18.6 ml 10.93 ml/m RA Volume:   16.80 ml 9.87 ml/m LA Vol (A4C):   35.0 ml 20.56 ml/m LA Biplane Vol: 26.2 ml 15.39 ml/m  AORTIC VALVE LVOT Vmax:   187.00 cm/s LVOT Vmean:  114.000 cm/s LVOT VTI:    0.327 m  AORTA Ao Root diam: 2.60 cm MITRAL VALVE MV Area (PHT): 2.39 cm     SHUNTS MV Decel Time: 317 msec     Systemic VTI:  0.33 m MV E velocity: 89.50 cm/s   Systemic Diam: 1.80 cm MV A velocity: 122.00 cm/s MV E/A ratio:  0.73 Loralie Champagne MD Electronically signed by Loralie Champagne MD Signature Date/Time: 12/30/2019/12:08:11 PM    Final     Anti-infectives: Anti-infectives (From admission, onward)   None      Assessment/Plan: HTN HLD  SBO, likely 2/2 adhesions -prior h/o tubal ligation and laparoscopic cholecystectomy 09/05/2019 - CT scan shows high-grade small bowel obstruction with complete decompression of distal ileum and near complete decompression of the colon, transition point in the central pelvis likely due to adhesions - Patient having bowel function and abdomen is non-distended  ID -none VTE -SCDs, sq heparin FEN -IVF, cont soft diet Foley -none Follow up -TBD  LOS: 3 days    Rosario Adie 12/05/9240

## 2020-01-01 NOTE — Progress Notes (Signed)
Family Medicine Teaching Service Daily Progress Note Intern Pager: [phone]  Patient name: Samantha Gonzales Medical record number: [id number] Date of birth: 1941/09/06 Age: 79 y.o. Gender: female  Primary Care Provider: System, Pcp Not In Consultants: Surgery Code Status: Full code  Pt Overview and Major Events to Date:  3/4 patient admitted for small bowel obstruction  Assessment and Plan: Samantha Gonzales is a 79 y.o. female presenting with 4-week history of progressive abdominal pain.  CT scan consistent with small bowel obstruction now with improving bowel function   Small Bowel Obstruction likely 2/2 adhesion/ Abdominal pain Advanced to soft solid diet and has been tolerating well this am. One stool charted last 24 hours. Surgery following, appreciate their recommendations.  Has had no radiographic resolution of sbo despite BMs and xray looks the same if not a little worse this am. Has been receiving dilaudid 0.5mg  q 3 hours essentially as soon as it is due although this is controlling her pain well. Will continue for now, can consider transition to oral soon. - follow up general surgery recommendations - continue colace 100mg  bid - miralax 17g daily - NS @ 124mL/hr, will stop kcl additive as she is taking po and k normal today  Hypokalemia/ Hypomagnesemia k 3.3 on recheck 3/6 pm. Received 6 runs 80m kcl 3/6. K 3.7 this am. Mg 1.6. will replete with mag sulfate 2g. - daily bmp, mg - replete with mg sulfate 2g  HTN/ Mitral Valve Regurgitation Echo with normal EF and mild MVR. BP still 153/80 this am. Home amlodipine 10mg  restarted 3/6. No indication for tighter control at this point so will continue to monitor.  Hyperlipidemia - continue rosuvastatin 20mg  daily  FEN/GI: soft solids PPx: Heparin subq 5000U tid  Disposition: PT/OT recommended home therapy with 24hr supervision or SNF if that is not available at home  Subjective:  Doing well this am. No issues aside from mild  abdominal pain.  Objective: Temp:  [97.6 F (36.4 C)-98.7 F (37.1 C)] 98.7 F (37.1 C) (03/07 0456) Pulse Rate:  [79-86] 79 (03/07 0456) Resp:  [16-18] 17 (03/07 0456) BP: (153-161)/(72-80) 153/80 (03/07 0456) SpO2:  [100 %] 100 % (03/07 0456) Physical Exam: General: 79 year old AA female, no acute distress Cardiovascular: rrr, systolic murmur at Right sternal border Respiratory: lungs ctab, no accessory muscle use Abdomen: soft, nt,, nd Extremities: No edema noted Derm: dry, warm, no rashes noted  Laboratory: Recent Labs  Lab 12/29/19 0421 12/30/19 0048  WBC 14.7* 9.9  HGB 13.9 12.8  HCT 40.3 37.3  PLT 319 282   Recent Labs  Lab 12/29/19 0421 12/29/19 0421 12/30/19 0048 12/30/19 1144 12/31/19 0826 12/31/19 2022 01/01/20 0851  NA 135   < > 137   < > 140 137 137  K 2.6*   < > 3.3*   < > 2.7* 3.3* 3.7  CL 93*   < > 101   < > 104 102 104  CO2 25   < > 24   < > 24 26 26   BUN 25*   < > 26*   < > 12 6* <5*  CREATININE 0.87   < > 0.85   < > 0.67 0.51 0.53  CALCIUM 9.9   < > 8.8*   < > 8.5* 8.2* 8.3*  PROT 7.3  --  6.1*  --   --   --   --   BILITOT 0.9  --  1.3*  --   --   --   --  ALKPHOS 56  --  46  --   --   --   --   ALT 12  --  14  --   --   --   --   AST 26  --  30  --   --   --   --   GLUCOSE 132*   < > 86   < > 88 116* 104*   < > = values in this interval not displayed.    Imaging/Diagnostic Tests: DG Abd 1 View  Result Date: 01/01/2020 CLINICAL DATA:  Small-bowel obstruction. EXAM: ABDOMEN - 1 VIEW COMPARISON:  12/31/2019; 12/30/2019; 12/29/2019; CT abdomen pelvis-12/29/2019 FINDINGS: Moderate gaseous distention of the stomach. Moderate gaseous distension of several centralized loops of small bowel with index loop of small bowel in the left mid hemiabdomen measuring 2.5 cm in diameter, similar to the 12/31/2018 examination, though progressed compared to the 3/5 and 3/4 examinations. These findings are again associated with a conspicuous paucity of distal  colonic gas. Nondiagnostic evaluation for pneumoperitoneum secondary to supine positioning and exclusion of the lower thorax. No pneumatosis or portal venous gas. Post cholecystectomy. No acute or aggressive osseous abnormalities. Degenerative change of the lower lumbar spine and bilateral hips is suspected though incompletely evaluated. Atherosclerotic plaque within the abdominal aorta. IMPRESSION: Findings worrisome for small bowel obstruction, similar to the 3/6 examination, though progressed compared to the 03/05 and 3/4 exams. Consideration for appropriateness of decompressive NG tube placement could be performed as indicated. Electronically Signed   By: Sandi Mariscal M.D.   On: 01/01/2020 08:31     Samantha Dawn, MD 01/01/2020, 9:41 AM PGY-3, [place] Intern pager: [phone], text pages welcome

## 2020-01-01 NOTE — Progress Notes (Signed)
FPTS Interim Progress Note  Called daughter and advised of current status.  Stated we will continue with soft diet.  She asks if she can bring her mother mashed potatoes and baked chicken.  Told her that mashed potatoes would be fine, but not baked chicken.  Advised we will continue to watch closely today and plan to see again around 6-7pm and ensure that pain is well-controlled.  Daughter, [name], was appreciative of the call.  Wolfe Camarena, Solmon Ice, DO 01/01/2020, 3:10 PM PGY-2, [facility] Family Medicine Service pager [phone]

## 2020-01-01 NOTE — Progress Notes (Signed)
FPTS Interim Progress Note  S:Patient seen and assessed at bedside for frequent monitoring of pain.  Reports that she continues to have intense pain but she is afraid to say anything because she is afraid people will think she "is a junkie."  Reports 9.5/10 pain at present.  O: BP (!) 153/80 (BP Location: Right Arm)   Pulse 79   Temp 98.7 F (37.1 C) (Oral)   Resp 17   Wt 53.5 kg   SpO2 100%   BMI 16.00 kg/m    Physical Exam:  General: 79 y.o. female in NAD, lying in bed, wincing in pain Lungs: breathing comfortably on RA Abdomen: Soft, TTP RLQ with voluntary guarding Skin: warm and dry   A/P: Encouraged patient to let nurse know when she is in pain She is awake and alert, received 1mg  diluadid 1629 Given significant pain, will prescribe one time dose of 0.5mg  dilaudid now Will also start scheduled tylenol  , DO 01/01/2020, 5:02 PM PGY-2, [facility] Family Medicine Service pager [phone]

## 2020-01-01 NOTE — Progress Notes (Signed)
FPTS Interim Progress Note  S: Went and assessed patient at bedside for frequent monitoring for pain.  When I entered the room she is sitting on the side of the bed.  Reports that she is still having abdominal pain and asks for pain medication.  I discussed with patient that her next dose of pain medicine is due around midnight and patient is amenable to this.  O: BP (!) 151/66 (BP Location: Right Arm)   Pulse 88   Temp 98.5 F (36.9 C) (Oral)   Resp 17   Wt 53.5 kg   SpO2 100%   BMI 16.00 kg/m   General: Sitting on the edge of the bed.  Intermittently closing her eyes. Respiratory: Normal work of breathing on room air Abdomen: Soft, tenderness to palpation  A/P: Patient is aware that her next dose of pain medicine is to 12:00 and would like it then. Last received pain medicine around 2100 where she got 1 mg of Dilaudid. Continue to monitor for worsening pain  Derrel Nip, MD 01/01/2020, 11:12 PM PGY-1, [facility] Family Medicine Service pager [phone]

## 2020-01-01 NOTE — Plan of Care (Signed)

## 2020-01-02 ENCOUNTER — Inpatient Hospital Stay (HOSPITAL_COMMUNITY): Payer: Medicare PPO

## 2020-01-02 LAB — BASIC METABOLIC PANEL
Anion gap: 8 (ref 5–15)
BUN: <5 mg/dL — ABNORMAL LOW (ref 8–23)
CO2: 27 mmol/L (ref 22–32)
Calcium: 8.2 mg/dL — ABNORMAL LOW (ref 8.9–10.3)
Chloride: 102 mmol/L (ref 98–111)
Creatinine, Ser: 0.5 mg/dL (ref 0.44–1.00)
GFR calc Af Amer: >60 mL/min (ref >60)
GFR calc non Af Amer: >60 mL/min (ref >60)
Glucose, Bld: 114 mg/dL — ABNORMAL HIGH (ref 70–99)
Potassium: 3 mmol/L — ABNORMAL LOW (ref 3.5–5.1)
Sodium: 137 mmol/L (ref 135–145)

## 2020-01-02 LAB — MAGNESIUM: Magnesium: 1.8 mg/dL (ref 1.7–2.4)

## 2020-01-02 MED ORDER — HYDROMORPHONE HCL 1 MG/ML IJ SOLN
0.5000 mg | INTRAMUSCULAR | Status: DC | PRN
  Administered 2020-01-02 – 2020-01-03 (×7): 0.5 mg via INTRAVENOUS
  Filled 2020-01-02 (×8): qty 1

## 2020-01-02 MED ORDER — POTASSIUM CHLORIDE CRYS ER 20 MEQ PO TBCR
40.0000 meq | EXTENDED_RELEASE_TABLET | Freq: Four times a day (QID) | ORAL | Status: AC
  Administered 2020-01-02 (×2): 40 meq via ORAL
  Filled 2020-01-02 (×2): qty 2

## 2020-01-02 MED ORDER — HYDROMORPHONE HCL 1 MG/ML IJ SOLN: 0.5000 mg | INTRAMUSCULAR | Status: DC | PRN

## 2020-01-02 MED ORDER — SIMETHICONE 80 MG PO CHEW: 80.0000 mg | CHEWABLE_TABLET | Freq: Four times a day (QID) | ORAL | Status: DC | PRN

## 2020-01-02 NOTE — Progress Notes (Signed)
[facility] Progress Note     Subjective: CC-  No abdominal complaints this morning. Denies n/v. Passing flatus and had multiple BMs yesterday. Daughter brought her some chicken and gravy from [employer] last night, ate about 1/2 for dinner and tolerated well.  Objective: Vital signs in last 24 hours: Temp:  [98.4 F (36.9 C)-98.6 F (37 C)] 98.4 F (36.9 C) (03/08 0523) Pulse Rate:  [85-88] 85 (03/08 0523) Resp:  [17-18] 18 (03/08 0523) BP: (138-151)/(66-80) 138/72 (03/08 0523) SpO2:  [100 %] 100 % (03/08 0523) Last BM Date: 01/01/20  Intake/Output from previous day: 03/07 0701 - 03/08 0700 In: 2655 [P.O.:880; I.V.:1775] Out: -  Intake/Output this shift: No intake/output data recorded.  PE: Gen:  Alert, NAD Pulm:  rate and effort normal Abd: Soft, NT/ND, +BS, no HSM, no hernia Ext:  no BUE/BLE edema, calves soft and nontender Skin: no rashes noted, warm and dry  Lab Results:  No results for input(s): WBC, HGB, HCT, PLT in the last 72 hours. BMET Recent Labs    01/01/20 0851 01/02/20 0430  NA 137 137  K 3.7 3.0*  CL 104 102  CO2 26 27  GLUCOSE 104* 114*  BUN <5* <5*  CREATININE 0.53 0.50  CALCIUM 8.3* 8.2*   PT/INR No results for input(s): LABPROT, INR in the last 72 hours. CMP     Component Value Date/Time   NA 137 01/02/2020 0430   K 3.0 (L) 01/02/2020 0430   CL 102 01/02/2020 0430   CO2 27 01/02/2020 0430   GLUCOSE 114 (H) 01/02/2020 0430   BUN <5 (L) 01/02/2020 0430   CREATININE 0.50 01/02/2020 0430   CALCIUM 8.2 (L) 01/02/2020 0430   PROT 6.1 (L) 12/30/2019 0048   ALBUMIN 3.3 (L) 12/30/2019 0048   AST 30 12/30/2019 0048   ALT 14 12/30/2019 0048   ALKPHOS 46 12/30/2019 0048   BILITOT 1.3 (H) 12/30/2019 0048   GFRNONAA >60 01/02/2020 0430   GFRAA >60 01/02/2020 0430   Lipase     Component Value Date/Time   LIPASE 16 12/29/2019 0421       Studies/Results: DG Abd 1 View  Result Date: 01/01/2020 CLINICAL DATA:   Small-bowel obstruction. EXAM: ABDOMEN - 1 VIEW COMPARISON:  12/31/2019; 12/30/2019; 12/29/2019; CT abdomen pelvis-12/29/2019 FINDINGS: Moderate gaseous distention of the stomach. Moderate gaseous distension of several centralized loops of small bowel with index loop of small bowel in the left mid hemiabdomen measuring 2.5 cm in diameter, similar to the 12/31/2018 examination, though progressed compared to the 3/5 and 3/4 examinations. These findings are again associated with a conspicuous paucity of distal colonic gas. Nondiagnostic evaluation for pneumoperitoneum secondary to supine positioning and exclusion of the lower thorax. No pneumatosis or portal venous gas. Post cholecystectomy. No acute or aggressive osseous abnormalities. Degenerative change of the lower lumbar spine and bilateral hips is suspected though incompletely evaluated. Atherosclerotic plaque within the abdominal aorta. IMPRESSION: Findings worrisome for small bowel obstruction, similar to the 3/6 examination, though progressed compared to the 03/05 and 3/4 exams. Consideration for appropriateness of decompressive NG tube placement could be performed as indicated. Electronically Signed   By: Sandi Mariscal M.D.   On: 01/01/2020 08:31    Anti-infectives: Anti-infectives (From admission, onward)   None       Assessment/Plan HTN HLD  SBO, likely 2/2 adhesions -prior h/o tubal ligation and laparoscopic cholecystectomy 09/05/2019 - CT scan shows high-grade small bowel obstruction with complete decompression of distal ileum and near complete  decompression of the colon, transition point in the central pelvis likely due to adhesions  ID -none VTE -SCDs, sq heparin FEN -IVF, soft diet Foley -none Follow up -TBD  Plan: Tolerating diet and having bowel function, obstruction resolving. Ok for discharge from surgical standpoint. We will sign off, please call with concerns.    LOS: 4 days    Franne Forts, [facility] 01/02/2020, 9:50 AM Please see Amion for pager number during day hours 7:00am-4:30pm

## 2020-01-02 NOTE — Progress Notes (Signed)
Per Dr Jennette Kettle, FPTS Attending: please return patient's property to her including chewable nicotine. It will help with bowel movements.  PGY-1, [facility] Family Medicine

## 2020-01-02 NOTE — Progress Notes (Signed)
Occupational Therapy Treatment Patient Details Name: DAZANI NORBY MRN: [id number] DOB: Apr 13, 1941 Today's Date: 01/02/2020    History of present illness Pt isa  79 y/o female with PMH of HTN, syncope with prior tubal ligation and laparoscopic cholecystectomy 09/05/2019, presenting to ED with complaints of abdominal pain. CT reveals small bowel obstruction suspected secondary to adhesions.   OT comments  Patient progressing slowly towards OT goals.  Patient with improved cognition today, oriented, improved awareness to deficits, able to attend to tasks, but with some STM deficits noted. Initially completed transfer to [facility] without AD given min guard, but completed in room mobility to sink using RW with min guard and improved stability.  Patient remains limited by pain, decreased activity tolerance and endurance. Continue to recommend 24/7 assist initially. Will follow acutely.    Follow Up Recommendations  Home health OT;Supervision/Assistance - 24 hour    Equipment Recommendations  3 in 1 bedside commode    Recommendations for Other Services      Precautions / Restrictions Precautions Precautions: Fall Restrictions Weight Bearing Restrictions: No       Mobility Bed Mobility Overal bed mobility: Needs Assistance Bed Mobility: Supine to Sit;Sit to Supine     Supine to sit: Supervision Sit to supine: Supervision   General bed mobility comments: for safety   Transfers Overall transfer level: Needs assistance Equipment used: Rolling walker (2 wheeled);None Transfers: Sit to/from Stand Sit to Stand: Min guard         General transfer comment: inital transfer to [facility] without RW, utilized RW for second transfer and significantly improved balance    Balance Overall balance assessment: Needs assistance Sitting-balance support: No upper extremity supported;Feet supported Sitting balance-Leahy Scale: Good     Standing balance support: Bilateral upper extremity supported;No  upper extremity supported;During functional activity Standing balance-Leahy Scale: Fair Standing balance comment: statically with 0 hand support min guard, dynamically preference to B UE support                           ADL either performed or assessed with clinical judgement   ADL Overall ADL's : Needs assistance/impaired     Grooming: Min guard;Standing;Wash/dry Electrical engineer Transfer: Min guard;Ambulation;BSC;RW   Toileting- Architect and Hygiene: Min guard;Sit to/from stand       Functional mobility during ADLs: Min guard;Rolling walker;Cueing for safety General ADL Comments: pt continues to be limited by pain, impaired balance, and decreased activity tolerance      Vision       Perception     Praxis      Cognition Arousal/Alertness: Awake/alert Behavior During Therapy: WFL for tasks assessed/performed Overall Cognitive Status: No family/caregiver present to determine baseline cognitive functioning Area of Impairment: Safety/judgement;Awareness;Problem solving;Memory                     Memory: Decreased short-term memory Following Commands: Follows one step commands consistently;Follows one step commands with increased time;Follows multi-step commands inconsistently Safety/Judgement: Decreased awareness of deficits;Decreased awareness of safety Awareness: Emergent Problem Solving: Requires verbal cues General Comments: patient with improved awareness to deficits, limited STM (but improved from last session), and oriented x 4 today         Exercises     Shoulder Instructions       General Comments pt reporting pain medication not working for her,  RN notified     Pertinent Vitals/ Pain       Pain Assessment: Faces Faces Pain Scale: Hurts even more Pain Location: stomach  Pain Descriptors / Indicators: Discomfort;Grimacing Pain Intervention(s): Limited activity within patient's tolerance;Monitored  during session;Premedicated before session;Repositioned  Home Living                                          Prior Functioning/Environment              Frequency  Min 2X/week        Progress Toward Goals  OT Goals(current goals can now be found in the care plan section)  Progress towards OT goals: Progressing toward goals  Acute Rehab OT Goals Patient Stated Goal: go home, less pain  OT Goal Formulation: With patient  Plan Discharge plan remains appropriate;Frequency remains appropriate    Co-evaluation                 AM-PAC OT "6 Clicks" Daily Activity     Outcome Measure   Help from another person eating meals?: A Little Help from another person taking care of personal grooming?: A Little Help from another person toileting, which includes using toliet, bedpan, or urinal?: A Little Help from another person bathing (including washing, rinsing, drying)?: A Little Help from another person to put on and taking off regular upper body clothing?: A Little Help from another person to put on and taking off regular lower body clothing?: A Little 6 Click Score: 18    End of Session Equipment Utilized During Treatment: Rolling walker  OT Visit Diagnosis: Other abnormalities of gait and mobility (R26.89);Muscle weakness (generalized) (M62.81);Pain;Other symptoms and signs involving cognitive function Pain - part of body: (stomach)   Activity Tolerance Patient limited by pain   Patient Left in bed;with call bell/phone within reach;with bed alarm set   Nurse Communication Mobility status;Patient requests pain meds        Time: 4098-1191 OT Time Calculation (min): 18 min  Charges: OT General Charges $OT Visit: 1 Visit OT Treatments $Self Care/Home Management : 8-22 mins  Jolaine Artist, OT Acute Rehabilitation Services Pager [phone] Office [phone]    Delight Stare 01/02/2020, 11:12 AM

## 2020-01-02 NOTE — Progress Notes (Signed)
Physical Therapy Treatment Patient Details Name: Samantha Gonzales MRN: [id number] DOB: 1940-11-14 Today's Date: 01/02/2020    History of Present Illness Pt isa  79 y/o female with PMH of HTN, syncope with prior tubal ligation and laparoscopic cholecystectomy 09/05/2019, presenting to ED with complaints of abdominal pain. CT reveals small bowel obstruction suspected secondary to adhesions.    PT Comments    Pt required mod verbal encouragement to participate in OOB mobility this session, but agreeable after "5 minute rest". Pt ambulated hallway distance with use of RW, pt increasingly unsteady today vs eval. Safety awareness also remains impaired, but overall cognition appears to be improving vs eval. PT to continue to follow acutely, HHPT appropriate with 24/7 assist from pt's children.   Of note, pt appeared to put black tarry substance in mouth upon PT returning with retrieving pt a cup of water, pt did not disclose what it was with PT questioning. Chewing tobacco noted in pt's file outside of room, RN notified.    Follow Up Recommendations  Home health PT;Supervision/Assistance - 24 hour     Equipment Recommendations  None recommended by PT    Recommendations for Other Services       Precautions / Restrictions Precautions Precautions: Fall Restrictions Weight Bearing Restrictions: No    Mobility  Bed Mobility Overal bed mobility: Needs Assistance Bed Mobility: Supine to Sit;Sit to Supine     Supine to sit: Supervision Sit to supine: Supervision   General bed mobility comments: for safety, pt with poor awareness of lines/leads.  Transfers Overall transfer level: Needs assistance Equipment used: Rolling walker (2 wheeled);None Transfers: Sit to/from Stand Sit to Stand: Min assist         General transfer comment: min assist to steady upon standing, pt reaching out for PT upon initial stand. STS x2, from EOB and from toilet.  Ambulation/Gait Ambulation/Gait  assistance: Min guard Gait Distance (Feet): 130 Feet Assistive device: Rolling walker (2 wheeled) Gait Pattern/deviations: Decreased stride length;Step-through pattern;Trunk flexed Gait velocity: decr   General Gait Details: Min guard for safety, verbal cuing for placement inside RW x2, upright posture.   Stairs             Wheelchair Mobility    Modified Rankin (Stroke Patients Only)       Balance Overall balance assessment: Needs assistance Sitting-balance support: No upper extremity supported;Feet supported Sitting balance-Leahy Scale: Good     Standing balance support: Bilateral upper extremity supported;No upper extremity supported;During functional activity Standing balance-Leahy Scale: Fair Standing balance comment: requires external support during dynamic standing                            Cognition Arousal/Alertness: Awake/alert Behavior During Therapy: WFL for tasks assessed/performed Overall Cognitive Status: No family/caregiver present to determine baseline cognitive functioning Area of Impairment: Safety/judgement;Awareness;Memory;Orientation;Attention;Problem solving;Following commands                 Orientation Level: Disoriented to;Time Current Attention Level: Selective Memory: Decreased short-term memory Following Commands: Follows one step commands consistently;Follows one step commands with increased time Safety/Judgement: Decreased awareness of deficits;Decreased awareness of safety Awareness: Emergent Problem Solving: Requires verbal cues;Requires tactile cues;Difficulty sequencing General Comments: Pt A&Ox3, states year is 2002. Pt with more awareness of physical deficits today, reaching out for PT to steady her and when PT stated RW was warranted, pt states "that's not a bad idea". Pt asked about when she could get pain meds  x3, told time period each time.      Exercises      General Comments General comments (skin  integrity, edema, etc.): pt reporting pain medication not working for her, RN notified       Pertinent Vitals/Pain Pain Assessment: Faces Faces Pain Scale: Hurts even more Pain Location: stomach  Pain Descriptors / Indicators: Discomfort;Grimacing;Guarding Pain Intervention(s): Limited activity within patient's tolerance;Monitored during session;Repositioned;Patient requesting pain meds-RN notified    Home Living                      Prior Function            PT Goals (current goals can now be found in the care plan section) Acute Rehab PT Goals Patient Stated Goal: go home PT Goal Formulation: With patient Time For Goal Achievement: 01/13/20 Potential to Achieve Goals: Good Progress towards PT goals: Progressing toward goals    Frequency    Min 3X/week      PT Plan Current plan remains appropriate    Co-evaluation              AM-PAC PT "6 Clicks" Mobility   Outcome Measure  Help needed turning from your back to your side while in a flat bed without using bedrails?: None Help needed moving from lying on your back to sitting on the side of a flat bed without using bedrails?: None Help needed moving to and from a bed to a chair (including a wheelchair)?: A Little Help needed standing up from a chair using your arms (e.g., wheelchair or bedside chair)?: A Little Help needed to walk in hospital room?: A Little Help needed climbing 3-5 steps with a railing? : A Lot 6 Click Score: 19    End of Session Equipment Utilized During Treatment: Gait belt Activity Tolerance: Patient limited by fatigue;Patient limited by pain Patient left: in bed;with call bell/phone within reach;with bed alarm set Nurse Communication: Mobility status PT Visit Diagnosis: Other abnormalities of gait and mobility (R26.89);Unsteadiness on feet (R26.81);Pain Pain - Right/Left: (mid) Pain - part of body: (abdomen)     Time: 7341-9379 PT Time Calculation (min) (ACUTE ONLY): 18  min  Charges:  $Gait Training: 8-22 mins                    Chenita Ruda E, PT [place] Pager [phone]  Office [phone]  Idaly Verret D Apryle Stowell 01/02/2020, 1:15 PM

## 2020-01-02 NOTE — TOC Initial Note (Addendum)
Transition of Care [facility]) - Initial/Assessment Note    Patient Details  Name: Samantha Gonzales MRN: [id number] Date of Birth: 02-13-1941  Transition of [facility]) CM/SW Contact:    Marilu Favre, RN Phone Number: 01/02/2020, 11:38 AM  Clinical Narrative:                 Called patient's daughter Butch Penny [phone] and left voicemail.   Called patient's daughter Laitrice [phone],. Patient lives with Butch Penny. Laitrice believes she has home health [place]. Patient has a walker, needs 3 in 1.   Called left message with Mateo Flow with [place]. Valerie with [facility] returned call. Patient is not active with them. They are unable to take due to insurance.   Awaiting call back from [place].  Expected Discharge Plan: [place] Barriers to Discharge: Continued Medical Work up   Patient Goals and CMS Choice Patient states their goals for this hospitalization and ongoing recovery are:: to return to home CMS Medicare.gov Compare Post Acute Care list provided to:: Patient Choice offered to / list presented to : Patient, Adult Children  Expected Discharge Plan and Services Expected Discharge Plan: [place]   Discharge Planning Services: CM Consult Post Acute Care Choice: [place] arrangements for the past 2 months: Single Family Home                 DME Arranged: 3-N-1 DME Agency: [employer] Date DME Agency Contacted: 01/02/20 Time DME Agency Contacted: 63 Representative spoke with at DME Agency: [place]            Prior Living Arrangements/Services Living arrangements for the past 2 months: [place] with:: Adult Children Patient language and need for interpreter reviewed:: Yes Do you feel safe going back to the place where you live?: Yes      Need for Family Participation in Patient Care: Yes (Comment) Care giver support system in place?: Yes (comment)   Criminal Activity/Legal Involvement  Pertinent to Current Situation/Hospitalization: No - Comment as needed  Activities of Daily Living   ADL Screening (condition at time of admission) Patient's cognitive ability adequate to safely complete daily activities?: Yes Is the patient deaf or have difficulty hearing?: No Does the patient have difficulty seeing, even when wearing glasses/contacts?: No Does the patient have difficulty concentrating, remembering, or making decisions?: Yes Patient able to express need for assistance with ADLs?: Yes Does the patient have difficulty dressing or bathing?: No Independently performs ADLs?: Yes (appropriate for developmental age) Does the patient have difficulty walking or climbing stairs?: No Weakness of Legs: Both Weakness of Arms/Hands: Both  Permission Sought/Granted   Permission granted to share information with : Yes, Verbal Permission Granted  Share Information with NAME: daughters Butch Penny [phone] [place] [phone]           Emotional Assessment Appearance:: Appears stated age            Admission diagnosis:  Hypokalemia [E87.6] Small bowel obstruction ([place]) [K56.609] SBO (small bowel obstruction) ([place]) [K56.609] Encounter for imaging study to confirm nasogastric (NG) tube placement [Z01.89] Patient Active Problem List   Diagnosis Date Noted  . Small bowel obstruction ([place]) 12/29/2019  . Pressure injury of skin 10/29/2019  . Colitis 10/28/2019  . AKI (acute kidney injury) ([place]) 10/27/2019  . Syncope 10/27/2019  . Hypokalemia 10/27/2019  . Essential hypertension 10/27/2019  . Malnutrition of moderate degree 09/07/2019  .  Cholecystitis 09/04/2019  . Altered mental status 01/18/2018  . Altered mental state 01/17/2018   PCP:  System, Pcp Not In Pharmacy:   [facility] [phone] - [place], [place] - [address] AT [facility] [address] [place] [address] Phone: [phone] Fax:  [phone]     Social Determinants of Health (SDOH) Interventions    Readmission Risk Interventions No flowsheet data found.

## 2020-01-02 NOTE — Progress Notes (Signed)
Family Medicine Teaching Service Daily Progress Note Intern Pager: [phone]  Patient name: Samantha Gonzales Medical record number: [id number] Date of birth: Sep 14, 1941 Age: 79 y.o. Gender: female  Primary Care Provider: System, Pcp Not In Consultants: Surgery Code Status: Full code  Pt Overview and Major Events to Date:  3/4 patient admitted for small bowel obstruction  Assessment and Plan: Samantha Gonzales is a 79 y.o. female presenting with 4-week history of progressive abdominal pain.  CT scan consistent with small bowel obstruction now with improving bowel function   Small Bowel Obstruction likely 2/2 adhesion/ Abdominal pain Has 8/10 severity abdominal pain. Would like more dilaudid. Pt sometimes forgetful that she has received the dilaudid and requests more. Seen overnight by night team for pain. On examination today: diffuse abdominal tenderness, guarding in RIF, hypoactive bowel sounds  -Surgery following, appreciate their recommendations, this mornings abdominal xray shows improvement in SBO with more gas in colon. Surgery signed off -Dilaudid switched to 0.5mg  Q2h from Dilaudid 1mg  q 3 hours -Follow up general surgery recommendations -Continue colace 100mg  bid -Miralax 17g daily -NS @ 158mL/hr  Hypokalemia/ Hypomagnesemia K 3 today. 3.7 on 3/7, repleted with Kdur 85meq today Mg 1.8, Mg 1.6 on 3/7 -Daily bmp, mg - Replete with mg sulfate 2g  HTN/ Mitral Valve Regurgitation Echo with normal EF and mild MVR. BP still 153/80 this am. Home amlodipine 10mg  restarted 3/6. No indication for tighter control at this point so will continue to monitor.  Hyperlipidemia - continue rosuvastatin 20mg  daily  FEN/GI: soft solids PPx: Heparin subq 5000U tid  Disposition: PT/OT recommended home therapy with 24hr supervision or SNF if that is not available at home  Subjective:  Reports 8/10 severity abdominal pain. Would like more dilaudid and says that she has not been given enough.    Went to review Ms Deupree at 2pm. She reported on going abdominal pain as soon as I entered the room but appeared to be quite comfortable laying down in bed. On examination she still has guarding in RIF and this is unchanged from this morning's exam. Per RN Cristie Hem pt is requesting Dilaudid even after she has had it. She appears to be forgetful and RN is unsure how much pt is truly requiring the analgesia.   Objective: Temp:  [98.3 F (36.8 C)-98.6 F (37 C)] 98.3 F (36.8 C) (03/08 1451) Pulse Rate:  [82-88] 82 (03/08 1451) Resp:  [17-18] 17 (03/08 1451) BP: (138-151)/(66-82) 150/82 (03/08 1451) SpO2:  [100 %] 100 % (03/08 1451)   General: Alert, well appearing 79 yr old female, no acute distress  Cardio: Normal S1 and S2, systolic murmur at right sternal edge. No murmurs or rubs.   Pulm: CTAB, normal WOB Abdomen: Diffuse abdominal tenderness, guarding in RIF. Bowel sounds present  Extremities: No peripheral edema. Warm/ well perfused Neuro: Cranial nerves grossly intact  Laboratory: Recent Labs  Lab 12/29/19 0421 12/30/19 0048  WBC 14.7* 9.9  HGB 13.9 12.8  HCT 40.3 37.3  PLT 319 282   Recent Labs  Lab 12/29/19 0421 12/29/19 0421 12/30/19 0048 12/30/19 1144 12/31/19 2022 01/01/20 0851 01/02/20 0430  NA 135   < > 137   < > 137 137 137  K 2.6*   < > 3.3*   < > 3.3* 3.7 3.0*  CL 93*   < > 101   < > 102 104 102  CO2 25   < > 24   < > 26 26 27   BUN 25*   < >  26*   < > 6* <5* <5*  CREATININE 0.87   < > 0.85   < > 0.51 0.53 0.50  CALCIUM 9.9   < > 8.8*   < > 8.2* 8.3* 8.2*  PROT 7.3  --  6.1*  --   --   --   --   BILITOT 0.9  --  1.3*  --   --   --   --   ALKPHOS 56  --  46  --   --   --   --   ALT 12  --  14  --   --   --   --   AST 26  --  30  --   --   --   --   GLUCOSE 132*   < > 86   < > 116* 104* 114*   < > = values in this interval not displayed.    Imaging/Diagnostic Tests: DG Abd Portable 1V  Result Date: 01/02/2020 CLINICAL DATA:  Abdominal pain,  distention EXAM: PORTABLE ABDOMEN - 1 VIEW COMPARISON:  01/01/2020 FINDINGS: Prior cholecystectomy. Pneumobilia noted. Dilated stomach. Gas throughout mildly prominent large and small bowel loops. No free air. No suspicious calcification. IMPRESSION: Mildly dilated stomach. Gas throughout mildly prominent large and small bowel loops. Overall, there appears to be more colonic gas than on prior study. Pneumobilia. Electronically Signed   By: Charlett Nose M.D.   On: 01/02/2020 11:25     Towanda Octave, MD 01/02/2020, 3:10 PM PGY-1, [facility] Family Medicine FPTS Intern pager: [phone], text pages welcome

## 2020-01-03 LAB — CBC
HCT: 30.6 % — ABNORMAL LOW (ref 36.0–46.0)
Hemoglobin: 10 g/dL — ABNORMAL LOW (ref 12.0–15.0)
MCH: 34.1 pg — ABNORMAL HIGH (ref 26.0–34.0)
MCHC: 32.7 g/dL (ref 30.0–36.0)
MCV: 104.4 fL — ABNORMAL HIGH (ref 80.0–100.0)
Platelets: 173 10*3/uL (ref 150–400)
RBC: 2.93 MIL/uL — ABNORMAL LOW (ref 3.87–5.11)
RDW: 14.1 % (ref 11.5–15.5)
WBC: 5.7 10*3/uL (ref 4.0–10.5)
nRBC: 0 % (ref 0.0–0.2)

## 2020-01-03 LAB — BASIC METABOLIC PANEL
Anion gap: 6 (ref 5–15)
BUN: <5 mg/dL — ABNORMAL LOW (ref 8–23)
CO2: 24 mmol/L (ref 22–32)
Calcium: 8.4 mg/dL — ABNORMAL LOW (ref 8.9–10.3)
Chloride: 109 mmol/L (ref 98–111)
Creatinine, Ser: 0.54 mg/dL (ref 0.44–1.00)
GFR calc Af Amer: >60 mL/min (ref >60)
GFR calc non Af Amer: >60 mL/min (ref >60)
Glucose, Bld: 89 mg/dL (ref 70–99)
Potassium: 4.1 mmol/L (ref 3.5–5.1)
Sodium: 139 mmol/L (ref 135–145)

## 2020-01-03 LAB — MAGNESIUM: Magnesium: 1.7 mg/dL (ref 1.7–2.4)

## 2020-01-03 MED ORDER — HYDROCODONE-ACETAMINOPHEN 7.5-325 MG PO TABS
1.0000 | ORAL_TABLET | Freq: Once | ORAL | Status: AC
  Administered 2020-01-03: 1 via ORAL
  Filled 2020-01-03: qty 1

## 2020-01-03 MED ORDER — MAGNESIUM SULFATE 2 GM/50ML IV SOLN
2.0000 g | Freq: Once | INTRAVENOUS | Status: AC
  Administered 2020-01-03: 2 g via INTRAVENOUS
  Filled 2020-01-03: qty 50

## 2020-01-03 MED ORDER — SENNA 8.6 MG PO TABS
1.0000 | ORAL_TABLET | Freq: Every day | ORAL | Status: DC
  Administered 2020-01-03: 8.6 mg via ORAL
  Filled 2020-01-03: qty 1

## 2020-01-03 MED ORDER — POLYETHYLENE GLYCOL 3350 17 G PO PACK: 17.0000 g | PACK | Freq: Every day | ORAL | 0 refills | Status: DC

## 2020-01-03 MED ORDER — TRAMADOL HCL 50 MG PO TABS: 50.0000 mg | ORAL_TABLET | Freq: Four times a day (QID) | ORAL | Status: DC

## 2020-01-03 MED ORDER — SENNA 8.6 MG PO TABS: 1.0000 | ORAL_TABLET | Freq: Every day | ORAL | 0 refills | Status: DC

## 2020-01-03 NOTE — Progress Notes (Signed)
FPTS Interim Progress Note  Attempted to call patient's daughter in regards to possible d/c today.  No answer, left VM.  Will continue to try to contact.  Camia Dipinto, Solmon Ice, DO 01/03/2020, 1:45 PM PGY-2, [facility] Family Medicine Service pager [phone]

## 2020-01-03 NOTE — Discharge Instructions (Signed)
Ms. Farrin, Shadle were admitted with small bowel obstruction because of adhesions around your bowel.  This means that parts of your bowel were stuck together which were not allowing food in stool to pass through causing you to have pain.  You had lots of imaging while you were in hospital which showed evidence of this.  You were seen by the surgical team did not feel that you required surgery. We managed you with IV fluids and pain medication and rechecked the obstruction with x-rays.  Your x-rays are much improved and you are now medically stable for discharge which is great!!  Please follow-up with family medicine clinic at [facility] on Friday, 11 March at 8:30 AM with Dr. Darin Engels.  The clinic will then let you know how to enroll as a new patient.  If in the meantime you develop worsening of abdominal pain, vomiting, fevers etc. please go to the ER immediately.  Best wishes, [facility]

## 2020-01-03 NOTE — Progress Notes (Signed)
Spoke to patient's daughter, Samantha Gonzales explained that patient is doing well and is ready to be discharged from hospital.  She said that her brother would come and collect her mother from hospital today.  Towanda Octave PGY-1, [facility] Family Medicine

## 2020-01-03 NOTE — Progress Notes (Signed)
Discharge education provided to patient and son Jalexa Pifer. Patient in agreement of going home with no narcotic pain medication prescribed. PICC line discontinue by IV team. All belonging send with patient's son including bed side commode.

## 2020-01-03 NOTE — Progress Notes (Signed)
PT Cancellation Note  Patient Details Name: Samantha Gonzales MRN: [id number] DOB: 1941/10/25   Cancelled Treatment:    Reason Eval/Treat Not Completed: Other (comment)  Pt very upset at this time, stating "I'm going home, the doctor told me I was going today, she lied, and I am going home, get my paperwork." Tried to calm pt and educate on role of PT but she stated "Leave me alone, get my paperwork, I want to leave."  Notified RN.  [name], PT Acute Rehab Services Pager [phone] [facility] Rehab [phone] [name] [phone]    [name] 01/03/2020, 2:20 PM

## 2020-01-03 NOTE — Care Management (Signed)
Called patient's daughter [name] again. Left voicemail regarding which home health agency she has chosen. If home health not set up prior to discharge she will have to arrange through PCP. Left my direct call back number.   Ronny Flurry RN

## 2020-01-03 NOTE — TOC Progression Note (Addendum)
Transition of Care [facility]) - Progression Note    Patient Details  Name: Samantha Gonzales MRN: [id number] Date of Birth: 1941/07/19  Transition of Care [facility]) CM/SW Contact  Verdis Koval, Adria Devon, RN Phone Number: 01/03/2020, 10:42 AM  Clinical Narrative:   Derenda Mis returned call. NCM will leave Medicare.gov list at bedside for [name] to review when she visits today. [name] will call NCM back with her choices.   Have not heard back from patient's daughter Dorota Heinrichs [phone]. Not at bedside. Called [name] and left another voicemail with my direct call back number. Await call back.   Expected Discharge Plan: Home w Home Health Services Barriers to Discharge: Continued Medical Work up  Expected Discharge Plan and Services Expected Discharge Plan: Home w Home Health Services   Discharge Planning Services: CM Consult Post Acute Care Choice: Home Health Living arrangements for the past 2 months: Single Family Home                 DME Arranged: 3-N-1 DME Agency: [employer] Date DME Agency Contacted: 01/02/20 Time DME Agency Contacted: 1136 Representative spoke with at DME Agency: Zack             Social Determinants of Health (SDOH) Interventions    Readmission Risk Interventions No flowsheet data found.

## 2020-01-03 NOTE — Plan of Care (Signed)

## 2020-01-05 NOTE — Discharge Summary (Signed)
[facility] Discharge Summary  Patient name: Samantha Gonzales Medical record number: [id number] Date of birth: 08/30/1941 Age: 79 y.o. Gender: female Date of Admission: 12/29/2019  Date of Discharge: 01/03/20 Admitting Physician: Dickie La, MD  Primary Care Provider: System, Pcp Not In Consultants: Surgery  Indication for Hospitalization: Small bowel obstruction  Discharge Diagnoses/Problem List: Small bowel obstruction  Hypokalemia Hyperlipidemia Hypertension Hyperglycemia Disposition: Home  Discharge Condition: Medically stable for discharge  Discharge Exam:  General: Alert and cooperative and appears to be in no acute distress Cardio: Normal S1 and S2, systolic murmur heard best at right sternal border, no murmurs or rubs.   Pulm: Clear to auscultation bilaterally, no crackles, wheezing, or diminished breath sounds. Normal respiratory effort Abdomen: Bowel sounds normal. Abdomen soft and non-tender.  Extremities: No peripheral edema. Warm/ well perfused.   Neuro: Cranial nerves grossly intact  Brief Hospital Course:    Small Bowel obstruction Patient presented with diffuse abdominal pain following laparoscopic cholecystectomy (post cholecystitis) November 2020. Patient also had a history of tubal ligation.  Abdomen was distended and there was mild tenderness to palpate in the entirety of the abdomen.  In the ED patient had K 2.6, WBC 14 and BUN 25.  CT abdomen pelvis showed high-grade small bowel obstruction with transition point likely due to adhesions like in her distal ileum. Postcholecystectomy changes improving pneumobilia biliary duct dilatation.  Surgery were consulted who placed NG tube and start patient on small bowel protocol, however patient pulled out NG tube. Gastrografin scan was ordered but patient did not receive this due to NG tube removal. Patient received IV fluids, morphine 2 mg every 3-4 hours for pain and initial soft diet as  tolerated. Patient was also started on MiraLAX and Colace and gradually had bowel movements in hospital. Per surgery had serial abdominal x-rays to monitor her bowel obstruction which improved over the course of the hospital stay.  Surgery signed off on 3/8 as patient's diet has been advanced, her bowel function returned with normal flatus and bowel movements.  Patient however stayed in hospital another night due to abdominal pain requiring IV Dilaudid 0.5mg  Q2H. After discussions with her daughter it was found that patient has chronic pain and takes Norco at home, she likely has underlying dementia which causes her to be forgetful of the medications she has received.  Intractable hypokalemia K 2.6 on admission and replaced accordingly with IV runs of potassium.  K on discharge was 4.1.  HTN/ Mitral Valve Regurgitation Systolic murmur heard in left sternal edge. Echo showed with normal EF and mild MVR.  Patients home amlodipine was restarted while in hospital.   Pt was medically stable for discharge on 3/9.  Issues for Follow Up:  1. Hospital follow up at [facility] on Friday 12th March 2. Arrange PCP, possibly with [place] 3. Monitor K on discharge  Significant Procedures:  None   Significant Labs and Imaging:  Recent Labs  Lab 12/30/19 0048 01/03/20 0432  WBC 9.9 5.7  HGB 12.8 10.0*  HCT 37.3 30.6*  PLT 282 173   Recent Labs  Lab 12/30/19 0048 12/30/19 1144 12/31/19 0826 12/31/19 0826 12/31/19 2022 12/31/19 2022 01/01/20 0851 01/01/20 0851 01/02/20 0430 01/03/20 0432  NA 137   < > 140  --  137  --  137  --  137 139  K 3.3*   < > 2.7*   < > 3.3*   < > 3.7   < > 3.0* 4.1  CL 101   < > 104  --  102  --  104  --  102 109  CO2 24   < > 24  --  26  --  26  --  27 24  GLUCOSE 86   < > 88  --  116*  --  104*  --  114* 89  BUN 26*   < > 12  --  6*  --  <5*  --  <5* <5*  CREATININE 0.85   < > 0.67  --  0.51  --  0.53  --  0.50 0.54  CALCIUM 8.8*   < > 8.5*  --  8.2*  --  8.3*  --  8.2*  8.4*  MG 2.3  --  1.9  --   --   --  1.6*  --  1.8 1.7  PHOS 2.3*  --   --   --   --   --   --   --   --   --   ALKPHOS 46  --   --   --   --   --   --   --   --   --   AST 30  --   --   --   --   --   --   --   --   --   ALT 14  --   --   --   --   --   --   --   --   --   ALBUMIN 3.3*  --   --   --   --   --   --   --   --   --    < > = values in this interval not displayed.      Results/Tests Pending at Time of Discharge:   Discharge Medications:  Allergies as of 01/03/2020      Reactions   Penicillins Hives, Itching, Rash   Has patient had a PCN reaction causing immediate rash, facial/tongue/throat swelling, SOB or lightheadedness with hypotension: Yes Has patient had a PCN reaction causing severe rash involving mucus membranes or skin necrosis: yes Has patient had a PCN reaction that required hospitalization: unk Has patient had a PCN reaction occurring within the last 10 years: unk If all of the above answers are "NO", then may proceed with Cephalosporin use.      Medication List    TAKE these medications   amLODipine 10 MG tablet Commonly known as: NORVASC Take 10 mg by mouth daily.   HYDROcodone-acetaminophen 7.5-325 MG tablet Commonly known as: NORCO Take 1 tablet by mouth 4 (four) times daily as needed for pain.   polyethylene glycol 17 g packet Commonly known as: MIRALAX / GLYCOLAX Take 17 g by mouth daily.   rosuvastatin 20 MG tablet Commonly known as: CRESTOR Take 20 mg by mouth daily.   senna 8.6 MG Tabs tablet Commonly known as: SENOKOT Take 1 tablet (8.6 mg total) by mouth daily.       Discharge Instructions: Please refer to Patient Instructions section of EMR for full details.  Patient was counseled important signs and symptoms that should prompt return to medical care, changes in medications, dietary instructions, activity restrictions, and follow up appointments.   Follow-Up Appointments:  Follow up with [facility] on Friday 12th March at  8:30am   Towanda Octave, MD 01/05/2020, 8:26 PM PGY-1, [facility]

## 2020-01-10 ENCOUNTER — Inpatient Hospital Stay: Payer: Medicare PPO | Admitting: Family Medicine

## 2020-01-11 ENCOUNTER — Inpatient Hospital Stay (HOSPITAL_COMMUNITY)
Admission: EM | Admit: 2020-01-11 | Discharge: 2020-01-23 | DRG: 329 | Disposition: A | Discharge status: Home Health | Payer: Medicare PPO | Attending: Physician Assistant | Admitting: Physician Assistant

## 2020-01-11 ENCOUNTER — Encounter (HOSPITAL_COMMUNITY): Payer: Self-pay | Admitting: Emergency Medicine

## 2020-01-11 ENCOUNTER — Emergency Department (HOSPITAL_COMMUNITY): Payer: Medicare PPO

## 2020-01-11 ENCOUNTER — Other Ambulatory Visit: Payer: Self-pay

## 2020-01-11 ENCOUNTER — Inpatient Hospital Stay (HOSPITAL_COMMUNITY): Payer: Medicare PPO

## 2020-01-11 DIAGNOSIS — K56609 Unspecified intestinal obstruction, unspecified as to partial versus complete obstruction: Secondary | ICD-10-CM | POA: Diagnosis present

## 2020-01-11 DIAGNOSIS — Z9049 Acquired absence of other specified parts of digestive tract: Secondary | ICD-10-CM

## 2020-01-11 DIAGNOSIS — N179 Acute kidney failure, unspecified: Secondary | ICD-10-CM | POA: Diagnosis not present

## 2020-01-11 DIAGNOSIS — K5651 Intestinal adhesions [bands], with partial obstruction: Secondary | ICD-10-CM | POA: Diagnosis present

## 2020-01-11 DIAGNOSIS — I248 Other forms of acute ischemic heart disease: Secondary | ICD-10-CM | POA: Diagnosis present

## 2020-01-11 DIAGNOSIS — Z79891 Long term (current) use of opiate analgesic: Secondary | ICD-10-CM

## 2020-01-11 DIAGNOSIS — Z720 Tobacco use: Secondary | ICD-10-CM | POA: Diagnosis not present

## 2020-01-11 DIAGNOSIS — R011 Cardiac murmur, unspecified: Secondary | ICD-10-CM | POA: Diagnosis not present

## 2020-01-11 DIAGNOSIS — Z681 Body mass index (BMI) 19 or less, adult: Secondary | ICD-10-CM

## 2020-01-11 DIAGNOSIS — K567 Ileus, unspecified: Secondary | ICD-10-CM | POA: Diagnosis not present

## 2020-01-11 DIAGNOSIS — I700 Atherosclerosis of aorta: Secondary | ICD-10-CM | POA: Diagnosis present

## 2020-01-11 DIAGNOSIS — I340 Nonrheumatic mitral (valve) insufficiency: Secondary | ICD-10-CM | POA: Diagnosis present

## 2020-01-11 DIAGNOSIS — G8929 Other chronic pain: Secondary | ICD-10-CM | POA: Diagnosis present

## 2020-01-11 DIAGNOSIS — R64 Cachexia: Secondary | ICD-10-CM | POA: Diagnosis not present

## 2020-01-11 DIAGNOSIS — E876 Hypokalemia: Secondary | ICD-10-CM | POA: Diagnosis present

## 2020-01-11 DIAGNOSIS — Z79899 Other long term (current) drug therapy: Secondary | ICD-10-CM

## 2020-01-11 DIAGNOSIS — R0902 Hypoxemia: Secondary | ICD-10-CM

## 2020-01-11 DIAGNOSIS — E785 Hyperlipidemia, unspecified: Secondary | ICD-10-CM | POA: Diagnosis present

## 2020-01-11 DIAGNOSIS — I10 Essential (primary) hypertension: Secondary | ICD-10-CM | POA: Diagnosis present

## 2020-01-11 DIAGNOSIS — E43 Unspecified severe protein-calorie malnutrition: Secondary | ICD-10-CM | POA: Insufficient documentation

## 2020-01-11 DIAGNOSIS — Z9851 Tubal ligation status: Secondary | ICD-10-CM | POA: Diagnosis not present

## 2020-01-11 DIAGNOSIS — Z20822 Contact with and (suspected) exposure to covid-19: Secondary | ICD-10-CM | POA: Diagnosis present

## 2020-01-11 DIAGNOSIS — D539 Nutritional anemia, unspecified: Secondary | ICD-10-CM | POA: Diagnosis not present

## 2020-01-11 DIAGNOSIS — R451 Restlessness and agitation: Secondary | ICD-10-CM | POA: Diagnosis not present

## 2020-01-11 DIAGNOSIS — R188 Other ascites: Secondary | ICD-10-CM | POA: Diagnosis present

## 2020-01-11 DIAGNOSIS — Z880 Allergy status to penicillin: Secondary | ICD-10-CM | POA: Diagnosis not present

## 2020-01-11 DIAGNOSIS — Z01818 Encounter for other preprocedural examination: Secondary | ICD-10-CM

## 2020-01-11 DIAGNOSIS — R109 Unspecified abdominal pain: Secondary | ICD-10-CM

## 2020-01-11 DIAGNOSIS — R400 Somnolence: Secondary | ICD-10-CM | POA: Diagnosis not present

## 2020-01-11 DIAGNOSIS — K529 Noninfective gastroenteritis and colitis, unspecified: Secondary | ICD-10-CM | POA: Diagnosis present

## 2020-01-11 DIAGNOSIS — D7589 Other specified diseases of blood and blood-forming organs: Secondary | ICD-10-CM | POA: Diagnosis not present

## 2020-01-11 DIAGNOSIS — D696 Thrombocytopenia, unspecified: Secondary | ICD-10-CM | POA: Diagnosis not present

## 2020-01-11 DIAGNOSIS — R7881 Bacteremia: Secondary | ICD-10-CM | POA: Diagnosis present

## 2020-01-11 LAB — COMPREHENSIVE METABOLIC PANEL
ALT: 12 U/L (ref 0–44)
AST: 21 U/L (ref 15–41)
Albumin: 3.5 g/dL (ref 3.5–5.0)
Alkaline Phosphatase: 81 U/L (ref 38–126)
Anion gap: 11 (ref 5–15)
BUN: 10 mg/dL (ref 8–23)
CO2: 23 mmol/L (ref 22–32)
Calcium: 9.3 mg/dL (ref 8.9–10.3)
Chloride: 105 mmol/L (ref 98–111)
Creatinine, Ser: 0.84 mg/dL (ref 0.44–1.00)
GFR calc Af Amer: >60 mL/min (ref >60)
GFR calc non Af Amer: >60 mL/min (ref >60)
Glucose, Bld: 128 mg/dL — ABNORMAL HIGH (ref 70–99)
Potassium: 3.5 mmol/L (ref 3.5–5.1)
Sodium: 139 mmol/L (ref 135–145)
Total Bilirubin: 0.5 mg/dL (ref 0.3–1.2)
Total Protein: 6.5 g/dL (ref 6.5–8.1)

## 2020-01-11 LAB — URINALYSIS, ROUTINE W REFLEX MICROSCOPIC
Bilirubin Urine: NEGATIVE
Glucose, UA: NEGATIVE mg/dL
Ketones, ur: 5 mg/dL — AB
Nitrite: NEGATIVE
Protein, ur: 100 mg/dL — AB
Specific Gravity, Urine: 1.021 (ref 1.005–1.030)
pH: 5 (ref 5.0–8.0)

## 2020-01-11 LAB — IRON AND TIBC
Iron: 26 ug/dL — ABNORMAL LOW (ref 28–170)
Saturation Ratios: 8 % — ABNORMAL LOW (ref 10.4–31.8)
TIBC: 310 ug/dL (ref 250–450)
UIBC: 284 ug/dL

## 2020-01-11 LAB — CBC WITH DIFFERENTIAL/PLATELET
Abs Immature Granulocytes: 0.07 10*3/uL (ref 0.00–0.07)
Basophils Absolute: 0 10*3/uL (ref 0.0–0.1)
Basophils Relative: 0 %
Eosinophils Absolute: 0 10*3/uL (ref 0.0–0.5)
Eosinophils Relative: 0 %
HCT: 40.7 % (ref 36.0–46.0)
Hemoglobin: 13.7 g/dL (ref 12.0–15.0)
Immature Granulocytes: 1 %
Lymphocytes Relative: 5 %
Lymphs Abs: 0.6 10*3/uL — ABNORMAL LOW (ref 0.7–4.0)
MCH: 34.9 pg — ABNORMAL HIGH (ref 26.0–34.0)
MCHC: 33.7 g/dL (ref 30.0–36.0)
MCV: 103.8 fL — ABNORMAL HIGH (ref 80.0–100.0)
Monocytes Absolute: 0.4 10*3/uL (ref 0.1–1.0)
Monocytes Relative: 4 %
Neutro Abs: 10.7 10*3/uL — ABNORMAL HIGH (ref 1.7–7.7)
Neutrophils Relative %: 90 %
Platelets: 438 10*3/uL — ABNORMAL HIGH (ref 150–400)
RBC: 3.92 MIL/uL (ref 3.87–5.11)
RDW: 14.4 % (ref 11.5–15.5)
WBC: 11.7 10*3/uL — ABNORMAL HIGH (ref 4.0–10.5)
nRBC: 0 % (ref 0.0–0.2)

## 2020-01-11 LAB — RESPIRATORY PANEL BY RT PCR (FLU A&B, COVID)
Influenza A by PCR: NEGATIVE
Influenza B by PCR: NEGATIVE
SARS Coronavirus 2 by RT PCR: NEGATIVE

## 2020-01-11 LAB — VITAMIN B12: Vitamin B-12: 214 pg/mL (ref 180–914)

## 2020-01-11 LAB — LIPASE, BLOOD: Lipase: 21 U/L (ref 11–51)

## 2020-01-11 LAB — RETICULOCYTES
Immature Retic Fract: 9.9 % (ref 2.3–15.9)
RBC.: 3.91 MIL/uL (ref 3.87–5.11)
Retic Count, Absolute: 30.1 10*3/uL (ref 19.0–186.0)
Retic Ct Pct: 0.8 % (ref 0.4–3.1)

## 2020-01-11 LAB — FERRITIN: Ferritin: 148 ng/mL (ref 11–307)

## 2020-01-11 LAB — MAGNESIUM: Magnesium: 2 mg/dL (ref 1.7–2.4)

## 2020-01-11 LAB — FOLATE: Folate: 4.8 ng/mL — ABNORMAL LOW (ref >5.9)

## 2020-01-11 MED ORDER — KETOROLAC TROMETHAMINE 30 MG/ML IJ SOLN: 15.0000 mg | Freq: Four times a day (QID) | INTRAMUSCULAR | Status: DC | PRN

## 2020-01-11 MED ORDER — FENTANYL CITRATE (PF) 100 MCG/2ML IJ SOLN
50.0000 ug | Freq: Once | INTRAMUSCULAR | Status: AC
  Administered 2020-01-11: 50 ug via INTRAVENOUS
  Filled 2020-01-11: qty 2

## 2020-01-11 MED ORDER — METOPROLOL TARTRATE 5 MG/5ML IV SOLN: 2.5000 mg | INTRAVENOUS | Status: DC | PRN

## 2020-01-11 MED ORDER — ONDANSETRON 4 MG PO TBDP: 4.0000 mg | ORAL_TABLET | Freq: Four times a day (QID) | ORAL | Status: DC | PRN

## 2020-01-11 MED ORDER — HYDROMORPHONE HCL 1 MG/ML IJ SOLN
0.5000 mg | INTRAMUSCULAR | Status: DC | PRN
  Administered 2020-01-11 – 2020-01-13 (×15): 1 mg via INTRAVENOUS
  Filled 2020-01-11 (×16): qty 1

## 2020-01-11 MED ORDER — ONDANSETRON HCL 4 MG/2ML IJ SOLN
4.0000 mg | Freq: Once | INTRAMUSCULAR | Status: AC
  Administered 2020-01-11: 09:00:00 4 mg via INTRAVENOUS
  Filled 2020-01-11: qty 2

## 2020-01-11 MED ORDER — DIPHENHYDRAMINE HCL 12.5 MG/5ML PO ELIX
12.5000 mg | ORAL_SOLUTION | Freq: Four times a day (QID) | ORAL | Status: DC | PRN
  Administered 2020-01-19 (×2): 12.5 mg via ORAL
  Filled 2020-01-11 (×2): qty 5

## 2020-01-11 MED ORDER — SODIUM CHLORIDE 0.9 % IV SOLN
2.0000 g | INTRAVENOUS | Status: AC
  Administered 2020-01-12: 2 g via INTRAVENOUS
  Filled 2020-01-11: qty 2

## 2020-01-11 MED ORDER — MENTHOL 3 MG MT LOZG
1.0000 | LOZENGE | OROMUCOSAL | Status: DC | PRN
  Filled 2020-01-11: qty 9

## 2020-01-11 MED ORDER — ONDANSETRON HCL 4 MG/2ML IJ SOLN
4.0000 mg | Freq: Four times a day (QID) | INTRAMUSCULAR | Status: DC | PRN
  Administered 2020-01-17: 4 mg via INTRAVENOUS

## 2020-01-11 MED ORDER — FENTANYL CITRATE (PF) 100 MCG/2ML IJ SOLN
50.0000 ug | Freq: Once | INTRAMUSCULAR | Status: AC
  Administered 2020-01-12: 100 ug via INTRAVENOUS
  Administered 2020-01-12 (×2): 50 ug via INTRAVENOUS
  Filled 2020-01-11: qty 2

## 2020-01-11 MED ORDER — POTASSIUM CHLORIDE 2 MEQ/ML IV SOLN
INTRAVENOUS | Status: AC
  Filled 2020-01-11 (×6): qty 1000

## 2020-01-11 MED ORDER — FENTANYL CITRATE (PF) 100 MCG/2ML IJ SOLN
100.0000 ug | Freq: Once | INTRAMUSCULAR | Status: AC
  Administered 2020-01-11: 06:00:00 100 ug via INTRAMUSCULAR

## 2020-01-11 MED ORDER — ONDANSETRON HCL 4 MG/2ML IJ SOLN
4.0000 mg | Freq: Once | INTRAMUSCULAR | Status: DC
  Filled 2020-01-11: qty 2

## 2020-01-11 MED ORDER — LIDOCAINE HCL URETHRAL/MUCOSAL 2 % EX GEL
CUTANEOUS | Status: AC
  Filled 2020-01-11: qty 30

## 2020-01-11 MED ORDER — SODIUM CHLORIDE 0.9 % IV BOLUS
1000.0000 mL | Freq: Once | INTRAVENOUS | Status: AC
  Administered 2020-01-11: 1000 mL via INTRAVENOUS

## 2020-01-11 MED ORDER — DIPHENHYDRAMINE HCL 50 MG/ML IJ SOLN
12.5000 mg | Freq: Four times a day (QID) | INTRAMUSCULAR | Status: DC | PRN
  Administered 2020-01-12 – 2020-01-18 (×4): 12.5 mg via INTRAVENOUS
  Filled 2020-01-11 (×4): qty 1

## 2020-01-11 MED ORDER — HEPARIN SODIUM (PORCINE) 5000 UNIT/ML IJ SOLN
5000.0000 [IU] | Freq: Three times a day (TID) | INTRAMUSCULAR | Status: DC
  Administered 2020-01-11 – 2020-01-19 (×21): 5000 [IU] via SUBCUTANEOUS
  Filled 2020-01-11 (×21): qty 1

## 2020-01-11 NOTE — ED Provider Notes (Signed)
[facility]-EMERGENCY DEPT Provider Note: Lowella Dell, MD, FACEP  CSN: [id number] MRN: [id number] ARRIVAL: 01/11/20 at 0310 ROOM: WA22/WA22   CHIEF COMPLAINT  Abdominal Pain   HISTORY OF PRESENT ILLNESS  01/11/20 3:44 AM Samantha Gonzales is a 79 y.o. female with a history of recent small bowel obstruction.  She returns with a 2-day history of abdominal pain which she describes as diffuse and sharp.  She states her pain is a 10 out of 10, worse with movement or palpation.  She has not been vomiting except when she has attempted to eat or drink.  She denies diarrhea or constipation.  Her abdomen is distended.   Past Medical History:  Diagnosis Date  . Hyperlipidemia   . Hypertension   . Syncope 10/27/2019    Past Surgical History:  Procedure Laterality Date  . CHOLECYSTECTOMY N/A 09/05/2019   Procedure: LAPAROSCOPIC CHOLECYSTECTOMY WITH INTRAOPERATIVE CHOLANGIOGRAM;  Surgeon: Harriette Bouillon, MD;  Location: [facility] OR;  Service: General;  Laterality: N/A;  . ERCP N/A 09/07/2019   Procedure: ENDOSCOPIC RETROGRADE CHOLANGIOPANCREATOGRAPHY (ERCP);  Surgeon: Vida Rigger, MD;  Location: [facility] ENDOSCOPY;  Service: Endoscopy;  Laterality: N/A;  . PANCREATIC STENT PLACEMENT  09/07/2019   Procedure: PANCREATIC STENT PLACEMENT;  Surgeon: Vida Rigger, MD;  Location: [facility] ENDOSCOPY;  Service: Endoscopy;;  . REMOVAL OF STONES  09/07/2019   Procedure: REMOVAL OF STONES;  Surgeon: Vida Rigger, MD;  Location: [facility] ENDOSCOPY;  Service: Endoscopy;;  . Dennison Mascot  09/07/2019   Procedure: Dennison Mascot;  Surgeon: Vida Rigger, MD;  Location: [facility] ENDOSCOPY;  Service: Endoscopy;;  . TUBAL LIGATION      Family History  Problem Relation Age of Onset  . CAD Neg Hx     Social History   Tobacco Use  . Smoking status: Never Smoker  . Smokeless tobacco: Current User    Types: Chew  Substance Use Topics  . Alcohol use: No  . Drug use: No    Prior to Admission medications   Medication Sig Start Date End Date  Taking? Authorizing Provider  amLODipine (NORVASC) 10 MG tablet Take 10 mg by mouth daily. 09/25/19  Yes [provider]  rosuvastatin (CRESTOR) 20 MG tablet Take 20 mg by mouth daily. 07/30/18  Yes [provider]  HYDROcodone-acetaminophen (NORCO) 7.5-325 MG tablet Take 1 tablet by mouth 4 (four) times daily as needed for pain. 09/27/19   [provider]  polyethylene glycol (MIRALAX / GLYCOLAX) 17 g packet Take 17 g by mouth daily. Patient not taking: Reported on 01/11/2020 01/04/20   Towanda Octave, MD  senna (SENOKOT) 8.6 MG TABS tablet Take 1 tablet (8.6 mg total) by mouth daily. Patient not taking: Reported on 01/11/2020 01/04/20   Towanda Octave, MD    Allergies Penicillins   REVIEW OF SYSTEMS  Negative except as noted here or in the History of Present Illness.   PHYSICAL EXAMINATION  Initial Vital Signs Blood pressure (!) 157/110, pulse (!) 117, temperature 98 F (36.7 C), temperature source Oral, resp. rate 16, height 6\' 1"  (1.854 m), weight 72.6 kg, SpO2 99 %.  Examination General: Well-developed, cachectic female in no acute distress; appearance consistent with age of record HENT: normocephalic; atraumatic Eyes: pupils equal, round and reactive to light; extraocular muscles intact; arcus senilis bilaterally; cataracts bilaterally Neck: supple Heart: regular rate and rhythm; harsh holosystolic murmur loudest at the apex Lungs: clear to auscultation bilaterally Abdomen: soft; distended; diffusely tender; right sided systolic bruit; bowel sounds hypoactive Extremities: No deformity; full range of motion;  pulses normal Neurologic: Awake, alert and oriented; motor function intact in all extremities and symmetric; no facial droop Skin: Warm and dry Psychiatric: Flat affect   RESULTS  Summary of this visit's results, reviewed and interpreted by myself:   EKG Interpretation  Date/Time:    Ventricular Rate:    PR Interval:    QRS Duration:   QT  Interval:    QTC Calculation:   R Axis:     Text Interpretation:        Laboratory Studies: Results for orders placed or performed during the hospital encounter of 01/11/20 (from the past 24 hour(s))  CBC with Differential/Platelet     Status: Abnormal   Collection Time: 01/11/20  6:15 AM  Result Value Ref Range   WBC 11.7 (H) 4.0 - 10.5 K/uL   RBC 3.92 3.87 - 5.11 MIL/uL   Hemoglobin 13.7 12.0 - 15.0 g/dL   HCT 81.8 29.9 - 37.1 %   MCV 103.8 (H) 80.0 - 100.0 fL   MCH 34.9 (H) 26.0 - 34.0 pg   MCHC 33.7 30.0 - 36.0 g/dL   RDW 69.6 78.9 - 38.1 %   Platelets 438 (H) 150 - 400 K/uL   nRBC 0.0 0.0 - 0.2 %   Neutrophils Relative % 90 %   Neutro Abs 10.7 (H) 1.7 - 7.7 K/uL   Lymphocytes Relative 5 %   Lymphs Abs 0.6 (L) 0.7 - 4.0 K/uL   Monocytes Relative 4 %   Monocytes Absolute 0.4 0.1 - 1.0 K/uL   Eosinophils Relative 0 %   Eosinophils Absolute 0.0 0.0 - 0.5 K/uL   Basophils Relative 0 %   Basophils Absolute 0.0 0.0 - 0.1 K/uL   Immature Granulocytes 1 %   Abs Immature Granulocytes 0.07 0.00 - 0.07 K/uL  Respiratory Panel by RT PCR (Flu A&B, Covid) - Nasopharyngeal Swab     Status: None   Collection Time: 01/11/20  7:28 AM   Specimen: Nasopharyngeal Swab  Result Value Ref Range   SARS Coronavirus 2 by RT PCR NEGATIVE NEGATIVE   Influenza A by PCR NEGATIVE NEGATIVE   Influenza B by PCR NEGATIVE NEGATIVE  Comprehensive metabolic panel     Status: Abnormal   Collection Time: 01/11/20  7:28 AM  Result Value Ref Range   Sodium 139 135 - 145 mmol/L   Potassium 3.5 3.5 - 5.1 mmol/L   Chloride 105 98 - 111 mmol/L   CO2 23 22 - 32 mmol/L   Glucose, Bld 128 (H) 70 - 99 mg/dL   BUN 10 8 - 23 mg/dL   Creatinine, Ser 0.17 0.44 - 1.00 mg/dL   Calcium 9.3 8.9 - 51.0 mg/dL   Total Protein 6.5 6.5 - 8.1 g/dL   Albumin 3.5 3.5 - 5.0 g/dL   AST 21 15 - 41 U/L   ALT 12 0 - 44 U/L   Alkaline Phosphatase 81 38 - 126 U/L   Total Bilirubin 0.5 0.3 - 1.2 mg/dL   GFR calc non Af Amer >60  >60 mL/min   GFR calc Af Amer >60 >60 mL/min   Anion gap 11 5 - 15  Lipase, blood     Status: None   Collection Time: 01/11/20  7:28 AM  Result Value Ref Range   Lipase 21 11 - 51 U/L  Urinalysis, Routine w reflex microscopic     Status: Abnormal   Collection Time: 01/11/20  7:46 AM  Result Value Ref Range   Color, Urine YELLOW YELLOW  APPearance CLEAR CLEAR   Specific Gravity, Urine 1.021 1.005 - 1.030   pH 5.0 5.0 - 8.0   Glucose, UA NEGATIVE NEGATIVE mg/dL   Hgb urine dipstick SMALL (A) NEGATIVE   Bilirubin Urine NEGATIVE NEGATIVE   Ketones, ur 5 (A) NEGATIVE mg/dL   Protein, ur 034 (A) NEGATIVE mg/dL   Nitrite NEGATIVE NEGATIVE   Leukocytes,Ua SMALL (A) NEGATIVE   RBC / HPF 6-10 0 - 5 RBC/hpf   WBC, UA 11-20 0 - 5 WBC/hpf   Bacteria, UA RARE (A) NONE SEEN   Squamous Epithelial / LPF 0-5 0 - 5   Mucus PRESENT    Hyaline Casts, UA PRESENT   Vitamin B12     Status: None   Collection Time: 01/11/20 12:13 PM  Result Value Ref Range   Vitamin B-12 214 180 - 914 pg/mL  Folate     Status: Abnormal   Collection Time: 01/11/20 12:13 PM  Result Value Ref Range   Folate 4.8 (L) >5.9 ng/mL  Iron and TIBC     Status: Abnormal   Collection Time: 01/11/20 12:13 PM  Result Value Ref Range   Iron 26 (L) 28 - 170 ug/dL   TIBC 742 595 - 638 ug/dL   Saturation Ratios 8 (L) 10.4 - 31.8 %   UIBC 284 ug/dL  Ferritin     Status: None   Collection Time: 01/11/20 12:13 PM  Result Value Ref Range   Ferritin 148 11 - 307 ng/mL  Reticulocytes     Status: None   Collection Time: 01/11/20 12:13 PM  Result Value Ref Range   Retic Ct Pct 0.8 0.4 - 3.1 %   RBC. 3.91 3.87 - 5.11 MIL/uL   Retic Count, Absolute 30.1 19.0 - 186.0 K/uL   Immature Retic Fract 9.9 2.3 - 15.9 %  Magnesium     Status: None   Collection Time: 01/11/20 12:13 PM  Result Value Ref Range   Magnesium 2.0 1.7 - 2.4 mg/dL   Imaging Studies: CT ABDOMEN PELVIS WO CONTRAST  Result Date: 01/11/2020 CLINICAL DATA:   Suspected bowel obstruction EXAM: CT ABDOMEN AND PELVIS WITHOUT CONTRAST TECHNIQUE: Multidetector CT imaging of the abdomen and pelvis was performed following the standard protocol without IV contrast. COMPARISON:  Bowel obstruction suspected FINDINGS: Lower chest: Interval clearance of some patchy airspace disease in the right middle lobe seen on recent comparison exam. Lung bases are clear. Cardiac size is within normal limits. Small volume pericardial effusion. Extensive coronary artery calcifications are noted. Distal thoracic aorta is heavily calcified as well. Hepatobiliary: Stable appearance of the bilobed hepatic cyst in the right lobe liver measuring up to 2.6 cm in size. Patient is post cholecystectomy with both intra and extrahepatic biliary ductal dilatation greater than expected for the post cholecystectomy state but with resolution of the pneumobilia seen on the comparison study. Pancreas: Mild pancreatic atrophy. No pancreatic ductal dilatation. Spleen: Normal in size without focal abnormality. Adrenals/Urinary Tract: Normal adrenal glands. Bilateral renal cysts are unchanged from prior. No new hydronephrosis or visible urolithiasis along the expected course of either ureter. Urinary bladder is largely decompressed at the time of exam and therefore poorly evaluated by CT imaging. Stomach/Bowel: Distal esophagus is unremarkable. The stomach is moderately distended. There is diffuse small bowel air and fluid distention beginning at the level of the duodenum with mild narrowing as the duodenum courses past ear to the IMA but with immediate resumption to the level of a focal region of tapering in right  lower quadrant. Several bowel loops change caliber in this location concerning for possible adhesion. Distal small bowel is largely decompressed. Moderate volume of stool throughout the colon. No frank colonic thickening or dilatation. Vascular/Lymphatic: Extensive atherosclerotic calcification of the  abdominal aorta and branch vessels. Luminal evaluation precluded in the absence of contrast media. Reproductive: Anteverted uterus. Few calcifications may reflect calcified uterine fibroids. Adnexa are difficult to evaluate the absence of contrast and with extensive bowel dilatation. Ovaries are not clearly discernible. Other: Small to moderate volume low-attenuation ascites is seen layering in the subphrenic space. No free abdominopelvic air. No bowel containing hernias. Mild body wall edema. Musculoskeletal: Multilevel degenerative changes are present in the imaged portions of the spine. No acute osseous abnormality or suspicious osseous lesion. Mild grade 1 anterolisthesis L3 on 4. No spondylolysis. IMPRESSION: 1. Diffuse small bowel air and fluid distention beginning at the level of the duodenum with mild narrowing as the duodenum courses posterior to the IMA but with immediate resumption in continuation to the level of a focal region of tapering in the right lower quadrant. Distal small bowel is largely decompressed. Findings are compatible with bowel obstruction possibly related to an adhesion. 2. Small to moderate volume low-attenuation ascites layering in the subphrenic space. No free abdominopelvic air. Additional circumferential body wall edema may reflect volume third-spacing. 3. Interval clearance of some patchy airspace disease in the right middle lobe seen on recent comparison exam. 4. Intra and extrahepatic biliary ductal dilatation, slightly greater than expected for the post cholecystectomy state. If there is concern for biliary obstruction recommend correlation with serologies and further evaluation with MRCP. 5. Aortic Atherosclerosis (ICD10-I70.0). Electronically Signed   By: Lovena Le M.D.   On: 01/11/2020 06:41   DG Chest 2 View  Result Date: 01/11/2020 CLINICAL DATA:  Abdominal pain and vomiting EXAM: CHEST - 2 VIEW COMPARISON:  01/17/2018 FINDINGS: Normal heart size and mild aortic  tortuosity. There is aortic atherosclerosis. There is no edema, consolidation, effusion, or pneumothorax. IMPRESSION: No evidence of acute disease. Electronically Signed   By: Monte Fantasia M.D.   On: 01/11/2020 10:04   DG Abdomen 1 View  Result Date: 01/11/2020 CLINICAL DATA:  NG tube placement. EXAM: ABDOMEN - 1 VIEW COMPARISON:  01/02/2020 FINDINGS: There is a nasogastric tube with tip and side port below the level of the GE junction in the expected location of the proximal stomach. Cholecystectomy clips are identified in the right upper quadrant of the abdomen. Gaseous distension of left upper quadrant bowel loops unchanged. IMPRESSION: NG tube tip is in the expected location of the proximal stomach. Electronically Signed   By: Kerby Moors M.D.   On: 01/11/2020 10:49    ED COURSE and MDM  Nursing notes, initial and subsequent vitals signs, including pulse oximetry, reviewed and interpreted by myself.  Vitals:   01/11/20 1030 01/11/20 1113 01/11/20 1356 01/11/20 2034  BP: (!) 154/77 (!) 169/79 (!) 160/74 140/60  Pulse: 97 85 80 74  Resp: 17 16 18 16   Temp:  98.2 F (36.8 C) 98.6 F (37 C) 98.1 F (36.7 C)  TempSrc:  Oral Oral Oral  SpO2: 100% 100% 99% 100%  Weight:      Height:       Medications  ondansetron (ZOFRAN) injection 4 mg (has no administration in time range)  fentaNYL (SUBLIMAZE) injection 50 mcg (has no administration in time range)  sodium chloride 0.9 % bolus 1,000 mL (1,000 mLs Intravenous New Bag/Given (Non-Interop) 01/11/20 1950)  fentaNYL (  SUBLIMAZE) injection 100 mcg (100 mcg Intramuscular Given 01/11/20 0533)   6:37 AM Patient given IM fentanyl as nursing staff was unable to obtain IV access.  IV team consult pending.  CT of the abdomen and pelvis without contrast obtained and results pending.   PROCEDURES  Procedures   ED DIAGNOSES     ICD-10-CM   1. Small bowel obstruction (HCC)  K56.609   2. Macrocytosis without anemia  D75.89   3. Other ascites   R18.8      Paula LibraMolpus, Nyisha Clippard, MD 01/11/20 2237

## 2020-01-11 NOTE — ED Notes (Signed)
IV Team at bedside 

## 2020-01-11 NOTE — Consult Note (Addendum)
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 vs long statnd severe HTN with development of LVOT  Gradient, mild mitral valve regurg  EKG (prior) - mild LVH with a prolonged QTc   Recommendations: Per Chales AbrahamsGupta perioperative Risk index she has a 0.3% risk of MI or cardiac arrest intraop for up to 30 days post op - I am uncertain of the presence of or extent of LVOT obstruction mentioned on above ECHO but a cardiology opinion may be prudent prior to surgery- I do not feel this should hold up her surgery- she has no symptoms suggestive of cardiac compromise  Prolonged QTC - on prior EKGs- will order a repeat EKG now - she has an NG, avoid anti emetics - check Mg level - keep K > 3.5- agree with K added to continuous IVF    Essential hypertension    Hyperlipidemia - hold Amlodipine and Crestor for now - close watch post op BP- can use PRN Lopressor if BP is uncontrolled and is not improving despite pain medications  Macrocytosis - HB is 13 (may lower as today she may be hemoconcentrated) but MCV 103-104 - will check B12 and Folate levels   Thank you for this consultation.  [facility] will follow the patient with you.   History of Present Illness: Samantha HalonMaxine B Caulder is an [age] y.o. female with a h/o HTN, HLD and mild Mitral regurgitation who presents to the hospital with abdominal pain and vomiting.  Recent history:  Lap Cholecystectomy for acute cholecystitis in 11/20  ERCP sphincterotomy with stenting of the ventral pancreatic duct on 09/07/19  Admitted from 12/31 - 10/31/19 for  a colitis   Subsequently [facility] and discharged on 3/9.  Her CT again is showing a SBO and we are asked for medical clearance due to her h/o a murmur. She developed abdominal pain, distension and mild vomiting a couple of days ago. No hematemesis. She can't recall her last BM. No fevers. No chest pain or dyspnea at rest or with exertion. No cough, runny or sore throat. Her children live with her. No known COVID exposure.     Review of Systems:  ROS As per HPI otherwise 10 point review of systems negative.      Past Medical History: Past Medical History:  Diagnosis Date  . Hyperlipidemia   . Hypertension   . Syncope 10/27/2019    Past Surgical History: Past Surgical History:  Procedure Laterality Date  . CHOLECYSTECTOMY N/A 09/05/2019   Procedure: LAPAROSCOPIC CHOLECYSTECTOMY WITH INTRAOPERATIVE CHOLANGIOGRAM;  Surgeon: Harriette Bouillonornett, Thomas, MD;  Location: [facility] OR;  Service: General;  Laterality: N/A;  . ERCP N/A 09/07/2019   Procedure: ENDOSCOPIC RETROGRADE CHOLANGIOPANCREATOGRAPHY (ERCP);  Surgeon: Vida RiggerMagod, Marc, MD;  [facility];  Service: Endoscopy;  Laterality: N/A;  . PANCREATIC STENT PLACEMENT  09/07/2019   Procedure: PANCREATIC STENT PLACEMENT;  Surgeon: Vida RiggerMagod, Marc, MD;  [facility];  Service: Endoscopy;;  . REMOVAL OF STONES  09/07/2019   Procedure: REMOVAL OF STONES;  Surgeon: Vida RiggerMagod, Marc, MD;  Location: [facility] ENDOSCOPY;  Service: Endoscopy;;  . SPHINCTEROTOMY  09/07/2019   Procedure: SPHINCTEROTOMY;  Surgeon: Vida Rigger, MD;  Location: [facility] ENDOSCOPY;  Service: Endoscopy;;  . TUBAL LIGATION       Allergies:   Allergies  Allergen Reactions  . Penicillins Hives, Itching and Rash    Has patient had a PCN reaction causing immediate rash, facial/tongue/throat swelling, SOB or lightheadedness with hypotension: Yes Has patient had a PCN reaction causing severe rash involving mucus membranes or skin  necrosis: yes Has patient had a PCN reaction that required hospitalization: unk Has patient had a PCN reaction occurring within the last 10 years: unk If all of the above answers are "NO", then may proceed with Cephalosporin use.      Social History:  reports that she has never smoked. Her smokeless tobacco use includes chew. She reports that she does not drink alcohol or use drugs.   Family History: Family History  Problem Relation Age of Onset  . CAD Neg Hx       Physical Exam: Vitals:   01/11/20 0750 01/11/20 0800 01/11/20 0830 01/11/20 0900  BP: (!) 175/86 (!) 171/83 (!) 176/79 (!) 175/80  Pulse: 98 (!) 102 (!) 101 96  Resp: 18  18 17   Temp:      TempSrc:      SpO2: 100% 100% 100% 100%  Weight:      Height:        Constitutional:    Alert and awake, oriented x3, not in any acute distress. Eyes: PERLA, EOMI, irises appear normal, anicteric sclera,  ENMT: external ears and nose appear normal, normal hearing            Lips appears normal, oropharynx mucosa, tongue, posterior pharynx appear normal  Neck: neck appears normal, no masses, normal ROM, no thyromegaly, no JVD  CVS: S1-S2 clear, harsh murmur 3/6 at Apex, LUSB and RUSB,  no LE edema, normal pedal pulses  Respiratory:  clear to auscultation bilaterally, no wheezing, rales or rhonchi. Respiratory effort normal. No accessory muscle use.  Abdomen: soft, distended and tender in mid abdomen, poor bowel sounds, NG tube draining black liquid. Musculoskeletal: : no cyanosis, clubbing or edema noted bilaterally Neuro: Cranial nerves II-XII intact, strength, sensation, reflexes Psych: judgement and insight appear normal, stable mood and affect, mental status Skin: no rashes or lesions or ulcers, no induration or nodules      Data reviewed:  I have personally reviewed following labs and imaging studies Labs:  CBC: Recent Labs  Lab 01/11/20 0615  WBC 11.7*  NEUTROABS 10.7*  HGB 13.7  HCT 40.7  MCV 103.8*  PLT  438*    Basic Metabolic Panel: Recent Labs  Lab 01/11/20 0728  NA 139  K 3.5  CL 105  CO2 23  GLUCOSE 128*  BUN 10  CREATININE 0.84  CALCIUM 9.3   GFR Estimated Creatinine Clearance: 65.3 mL/min (by C-G formula based on SCr of 0.84 mg/dL). Liver Function Tests: Recent Labs  Lab 01/11/20 0728  AST 21  ALT 12  ALKPHOS 81  BILITOT 0.5  PROT 6.5  ALBUMIN 3.5   Recent Labs  Lab 01/11/20 0728  LIPASE 21   No results for input(s): AMMONIA in the last 168 hours. Coagulation profile No results for input(s): INR, PROTIME in the last 168 hours.  Cardiac Enzymes: No results for input(s): CKTOTAL, CKMB, CKMBINDEX, TROPONINI in the last 168 hours. BNP: Invalid input(s): POCBNP CBG: No results for input(s): GLUCAP in the last  168 hours. D-Dimer No results for input(s): DDIMER in the last 72 hours. Hgb A1c No results for input(s): HGBA1C in the last 72 hours. Lipid Profile No results for input(s): CHOL, HDL, LDLCALC, TRIG, CHOLHDL, LDLDIRECT in the last 72 hours. Thyroid function studies No results for input(s): TSH, T4TOTAL, T3FREE, THYROIDAB in the last 72 hours.  Invalid input(s): FREET3 Anemia work up No results for input(s): VITAMINB12, FOLATE, FERRITIN, TIBC, IRON, RETICCTPCT in the last 72 hours. Urinalysis    Component Value Date/Time   COLORURINE YELLOW 01/11/2020 0746   APPEARANCEUR CLEAR 01/11/2020 0746   LABSPEC 1.021 01/11/2020 0746   PHURINE 5.0 01/11/2020 0746   GLUCOSEU NEGATIVE 01/11/2020 0746   HGBUR SMALL (A) 01/11/2020 0746   BILIRUBINUR NEGATIVE 01/11/2020 0746   KETONESUR 5 (A) 01/11/2020 0746   PROTEINUR 100 (A) 01/11/2020 0746   NITRITE NEGATIVE 01/11/2020 0746   LEUKOCYTESUR SMALL (A) 01/11/2020 0746     Sepsis Labs Invalid input(s): PROCALCITONIN,  WBC,  LACTICIDVEN Microbiology Recent Results (from the past 240 hour(s))  Respiratory Panel by RT PCR (Flu A&B, Covid) - Nasopharyngeal Swab     Status: None   Collection Time:  01/11/20  7:28 AM   Specimen: Nasopharyngeal Swab  Result Value Ref Range Status   SARS Coronavirus 2 by RT PCR NEGATIVE NEGATIVE Final    Comment: (NOTE) SARS-CoV-2 target nucleic acids are NOT DETECTED. The SARS-CoV-2 RNA is generally detectable in upper respiratoy specimens during the acute phase of infection. The lowest concentration of SARS-CoV-2 viral copies this assay can detect is 131 copies/mL. A negative result does not preclude SARS-Cov-2 infection and should not be used as the sole basis for treatment or other patient management decisions. A negative result may occur with  improper specimen collection/handling, submission of specimen other than nasopharyngeal swab, presence of viral mutation(s) within the areas targeted by this assay, and inadequate number of viral copies (<131 copies/mL). A negative result must be combined with clinical observations, patient history, and epidemiological information. The expected result is Negative. Fact Sheet for Patients:  PinkCheek.be Fact Sheet for Healthcare Providers:  GravelBags.it This test is not yet ap proved or cleared by the [place] FDA and  has been authorized for detection and/or diagnosis of SARS-CoV-2 by FDA under an Emergency Use Authorization (EUA). This EUA will remain  in effect (meaning this test can be used) for the duration of the COVID-19 declaration under Section 564(b)(1) of the Act, 21 U.S.C. section 360bbb-3(b)(1), unless the authorization is terminated or revoked sooner.    Influenza A by PCR NEGATIVE NEGATIVE Final   Influenza B by PCR NEGATIVE NEGATIVE Final    Comment: (NOTE) The Xpert Xpress SARS-CoV-2/FLU/RSV assay is intended as an aid in  the diagnosis of influenza from Nasopharyngeal swab specimens and  should not be used as a sole basis for treatment. Nasal washings and  aspirates are unacceptable for Xpert Xpress SARS-CoV-2/FLU/RSV    testing. Fact Sheet for Patients: PinkCheek.be Fact Sheet for Healthcare Providers: GravelBags.it This test is not yet approved or cleared by the [place] FDA and  has been authorized for detection and/or diagnosis of SARS-CoV-2 by  FDA under an Emergency Use Authorization (EUA). This EUA will remain  in effect (meaning this test can be used) for the duration of the  Covid-19 declaration under Section 564(b)(1) of the Act, 21  U.S.C. section 360bbb-3(b)(1), unless the authorization is  terminated or revoked. Performed at [facility], [place] [address] [place], [place] [address]  Inpatient Medications:   Scheduled Meds: . fentaNYL (SUBLIMAZE) injection  50 mcg Intravenous Once  . ondansetron (ZOFRAN) IV  4 mg Intravenous Once   Continuous Infusions: . dextrose 5 % lactated ringers with kcl       Radiological Exams on Admission: CT ABDOMEN PELVIS WO CONTRAST  Result Date: 01/11/2020 CLINICAL DATA:  Suspected bowel obstruction EXAM: CT ABDOMEN AND PELVIS WITHOUT CONTRAST TECHNIQUE: Multidetector CT imaging of the abdomen and pelvis was performed following the standard protocol without IV contrast. COMPARISON:  Bowel obstruction suspected FINDINGS: Lower chest: Interval clearance of some patchy airspace disease in the right middle lobe seen on recent comparison exam. Lung bases are clear. Cardiac size is within normal limits. Small volume pericardial effusion. Extensive coronary artery calcifications are noted. Distal thoracic aorta is heavily calcified as well. Hepatobiliary: Stable appearance of the bilobed hepatic cyst in the right lobe liver measuring up to 2.6 cm in size. Patient is post cholecystectomy with both intra and extrahepatic biliary ductal dilatation greater than expected for the post cholecystectomy state but with resolution of the pneumobilia seen on the comparison study. Pancreas:  Mild pancreatic atrophy. No pancreatic ductal dilatation. Spleen: Normal in size without focal abnormality. Adrenals/Urinary Tract: Normal adrenal glands. Bilateral renal cysts are unchanged from prior. No new hydronephrosis or visible urolithiasis along the expected course of either ureter. Urinary bladder is largely decompressed at the time of exam and therefore poorly evaluated by CT imaging. Stomach/Bowel: Distal esophagus is unremarkable. The stomach is moderately distended. There is diffuse small bowel air and fluid distention beginning at the level of the duodenum with mild narrowing as the duodenum courses past ear to the IMA but with immediate resumption to the level of a focal region of tapering in right lower quadrant. Several bowel loops change caliber in this location concerning for possible adhesion. Distal small bowel is largely decompressed. Moderate volume of stool throughout the colon. No frank colonic thickening or dilatation. Vascular/Lymphatic: Extensive atherosclerotic calcification of the abdominal aorta and branch vessels. Luminal evaluation precluded in the absence of contrast media. Reproductive: Anteverted uterus. Few calcifications may reflect calcified uterine fibroids. Adnexa are difficult to evaluate the absence of contrast and with extensive bowel dilatation. Ovaries are not clearly discernible. Other: Small to moderate volume low-attenuation ascites is seen layering in the subphrenic space. No free abdominopelvic air. No bowel containing hernias. Mild body wall edema. Musculoskeletal: Multilevel degenerative changes are present in the imaged portions of the spine. No acute osseous abnormality or suspicious osseous lesion. Mild grade 1 anterolisthesis L3 on 4. No spondylolysis. IMPRESSION: 1. Diffuse small bowel air and fluid distention beginning at the level of the duodenum with mild narrowing as the duodenum courses posterior to the IMA but with immediate resumption in continuation  to the level of a focal region of tapering in the right lower quadrant. Distal small bowel is largely decompressed. Findings are compatible with bowel obstruction possibly related to an adhesion. 2. Small to moderate volume low-attenuation ascites layering in the subphrenic space. No free abdominopelvic air. Additional circumferential body wall edema may reflect volume third-spacing. 3. Interval clearance of some patchy airspace disease in the right middle lobe seen on recent comparison exam. 4. Intra and extrahepatic biliary ductal dilatation, slightly greater than expected for the post cholecystectomy state. If there is concern for biliary obstruction recommend correlation with serologies and further evaluation with MRCP. 5. Aortic Atherosclerosis (ICD10-I70.0). Electronically Signed   By: Kreg Shropshire M.D.   On: 01/11/2020 06:41  EKG: pending   Time Spent: 43  Calvert Cantor M.D. [facility] 01/11/2020, 10:01 AM

## 2020-01-11 NOTE — ED Notes (Signed)
Transported to CT 

## 2020-01-11 NOTE — H&P (Addendum)
[facility] Surgery Admission Note   Samantha Gonzales 10-Sep-1941  [id number].     Requesting MD: Paula Libra Chief Complaint: abdominal pain and vomiting; pain since discharge earlier this month Reason for Consult: recurrent SBO   HPI: Patient is a 79 year old female who was hospitalized 12/29/2019-01/03/2020 with a small bowel obstruction.  She has a history of hypertension, hyperlipidemia, prior tubal ligation and laparoscopic cholecystectomy 09/05/2019.  She underwent intraoperative cholangiogram which showed an obstruction at surgery.  Dr. Ewing Schlein did an ERCP, sphincterotomy, stent placement with removal of sludge on 09/07/2019. She had further complications postop when she developed possible gallbladder fossa abscess and had a CT-guided right transgluteal drain catheter placement, and a second drain in the gallbladder fossa.  I cannot tell when the drains were removed.   She returned to the ED on 10/27/2019 with a loss of consciousness, and hypotension.  CT at that time showed a right-sided colitis, cholecystectomy with resolution of fluid collections in the operative bed, mild biliary dilatation which is chronic.  She was treated with Cipro and Flagyl and discharged home on 10/31/2019.     On follow up with her SBO on 12/29/19, she reported intermittent pain on and off for 4 weeks prior to that admission.  CT scan at that time showed a high-grade small bowel obstruction and incomplete decompression of the distal ileum and decompression of the colon, transition point in the central pelvis likely due to adhesions.  She was placed on small bowel protocol with NG placement and bowel rest.  She was also found to have some pneumobilia which is related to her prior cholecystectomy/ERCP and stent placement.  Patient reported a large bowel movement on 12/30/2019, but ongoing pain.  Her diet was advanced and she was discharged home on 01/03/2020.   Patient presented to the ED again this morning around 3 AM with  abdominal pain and vomiting.  She describes her pain is 10 out of 10 worse with movement and palpation. CT scan this a.m. shows diffuse small bowel air-fluid distention beginning at the level of the duodenum with mild narrowing of the duodenum coursed posterior to the IMA with a with immediate resumption continuation level focal tapering the right lower quadrant.  The distal small bowel is largely decompressed.  Findings are compatible with small bowel obstruction.  There is a small to moderate amount of low-attenuation ascites layering in the supra phrenic space.  There is circumferential body wall edema which may reflect third spacing of fluid.  There is interval clearance of some patchy airspace disease in the right middle lobe, anterior and extrahepatic ductal dilatation slightly greater than expected post cholecystectomy.  We are asked to see.       Review of Systems  Constitutional: Positive for weight loss (she isn't sure).  HENT: Negative.   Eyes: Negative.   Respiratory: Negative.   Cardiovascular: Negative.   Gastrointestinal: Positive for abdominal pain, nausea and vomiting. Negative for constipation and diarrhea.  Genitourinary: Negative.   Musculoskeletal: Negative.   Skin: Negative.   Neurological: Negative.   Endo/Heme/Allergies: Negative.   Psychiatric/Behavioral: Positive for depression.           Family History  Problem Relation Age of Onset   CAD Neg Hx            Past Medical History:  Diagnosis Date   Hyperlipidemia     Hypertension     Syncope 10/27/2019           Past Surgical History:  Procedure Laterality Date   CHOLECYSTECTOMY N/A 09/05/2019    Procedure: LAPAROSCOPIC CHOLECYSTECTOMY WITH INTRAOPERATIVE CHOLANGIOGRAM;  Surgeon: Harriette Bouillon, MD;  Location: [facility] OR;  Service: General;  Laterality: N/A;   ERCP N/A 09/07/2019    Procedure: ENDOSCOPIC RETROGRADE CHOLANGIOPANCREATOGRAPHY (ERCP);  Surgeon: Vida Rigger, MD;  Location: [facility] ENDOSCOPY;  Service:  Endoscopy;  Laterality: N/A;   PANCREATIC STENT PLACEMENT   09/07/2019    Procedure: PANCREATIC STENT PLACEMENT;  Surgeon: Vida Rigger, MD;  Location: [facility] ENDOSCOPY;  Service: Endoscopy;;   REMOVAL OF STONES   09/07/2019    Procedure: REMOVAL OF STONES;  Surgeon: Vida Rigger, MD;  Location: [facility] ENDOSCOPY;  Service: Endoscopy;;   SPHINCTEROTOMY   09/07/2019    Procedure: Dennison Mascot;  Surgeon: Vida Rigger, MD;  Location: [facility] ENDOSCOPY;  Service: Endoscopy;;   TUBAL LIGATION          Social History:  reports that she has never smoked. Her smokeless tobacco use includes chew. She reports that she does not drink alcohol or use drugs.   Allergies:       Allergies  Allergen Reactions   Penicillins Hives, Itching and Rash      Has patient had a PCN reaction causing immediate rash, facial/tongue/throat swelling, SOB or lightheadedness with hypotension: Yes Has patient had a PCN reaction causing severe rash involving mucus membranes or skin necrosis: yes Has patient had a PCN reaction that required hospitalization: unk Has patient had a PCN reaction occurring within the last 10 years: unk If all of the above answers are "NO", then may proceed with Cephalosporin use.               Prior to Admission medications   Medication Sig Start Date End Date Taking? Authorizing Provider  amLODipine (NORVASC) 10 MG tablet Take 10 mg by mouth daily. 09/25/19   Yes [provider]  rosuvastatin (CRESTOR) 20 MG tablet Take 20 mg by mouth daily. 07/30/18   Yes [provider]  HYDROcodone-acetaminophen (NORCO) 7.5-325 MG tablet Take 1 tablet by mouth 4 (four) times daily as needed for pain. 09/27/19     [provider]  polyethylene glycol (MIRALAX / GLYCOLAX) 17 g packet Take 17 g by mouth daily. Patient not taking: Reported on 01/11/2020 01/04/20     Towanda Octave, MD  senna (SENOKOT) 8.6 MG TABS tablet Take 1 tablet (8.6 mg total) by mouth daily. Patient not taking: Reported on  01/11/2020 01/04/20     Towanda Octave, MD        Blood pressure (!) 175/86, pulse 98, temperature 98 F (36.7 C), temperature source Oral, resp. rate 18, height 6\' 1"  (1.854 m), weight 72.6 kg, SpO2 100 %. Physical Exam:  General:thin cachectic female, complaining of ongoing pain.   HEENT: head is normocephalic, atraumatic.  Sclera are noninjected.  Pupils are equal.  Ears and nose without any masses or lesions.  Mouth is pink and moist Heart: regular, rate, and rhythm.  Normal s1,s2. IV/VI mitral murmur, no gallops, or rubs noted.  Palpable radial and pedal pulses bilaterally. Lungs: CTAB, no wheezes, rhonchi, or rales noted.  Respiratory effort nonlabored Abd: distended, she complains of pain all over.  BS hyperactive, last BM yesterday MS: all 4 extremities are symmetrical with no cyanosis, clubbing, or edema. Skin: warm and dry with no masses, lesions, or rashes Neuro: Cranial nerves 2-12 grossly intact, sensation is normal throughout Psych: A&Ox3 with an appropriate affect.    Lab Results Last 48 Hours  Results for orders placed or performed during the hospital encounter of 01/11/20 (from the past 48 hour(s))  CBC with Differential/Platelet     Status: Abnormal    Collection Time: 01/11/20  6:15 AM  Result Value Ref Range    WBC 11.7 (H) 4.0 - 10.5 K/uL    RBC 3.92 3.87 - 5.11 MIL/uL    Hemoglobin 13.7 12.0 - 15.0 g/dL    HCT 09.840.7 11.936.0 - 14.746.0 %    MCV 103.8 (H) 80.0 - 100.0 fL    MCH 34.9 (H) 26.0 - 34.0 pg    MCHC 33.7 30.0 - 36.0 g/dL    RDW 82.914.4 56.211.5 - 13.015.5 %    Platelets 438 (H) 150 - 400 K/uL    nRBC 0.0 0.0 - 0.2 %    Neutrophils Relative % 90 %    Neutro Abs 10.7 (H) 1.7 - 7.7 K/uL    Lymphocytes Relative 5 %    Lymphs Abs 0.6 (L) 0.7 - 4.0 K/uL    Monocytes Relative 4 %    Monocytes Absolute 0.4 0.1 - 1.0 K/uL    Eosinophils Relative 0 %    Eosinophils Absolute 0.0 0.0 - 0.5 K/uL    Basophils Relative 0 %    Basophils Absolute 0.0 0.0 - 0.1 K/uL    Immature  Granulocytes 1 %    Abs Immature Granulocytes 0.07 0.00 - 0.07 K/uL      Comment: Performed at [facility], [address] [place], [place] [address]  Comprehensive metabolic panel     Status: Abnormal    Collection Time: 01/11/20  7:28 AM  Result Value Ref Range    Sodium 139 135 - 145 mmol/L    Potassium 3.5 3.5 - 5.1 mmol/L    Chloride 105 98 - 111 mmol/L    CO2 23 22 - 32 mmol/L    Glucose, Bld 128 (H) 70 - 99 mg/dL      Comment: Glucose reference range applies only to samples taken after fasting for at least 8 hours.    BUN 10 8 - 23 mg/dL    Creatinine, Ser 4.690.84 0.44 - 1.00 mg/dL    Calcium 9.3 8.9 - 62.910.3 mg/dL    Total Protein 6.5 6.5 - 8.1 g/dL    Albumin 3.5 3.5 - 5.0 g/dL    AST 21 15 - 41 U/L    ALT 12 0 - 44 U/L    Alkaline Phosphatase 81 38 - 126 U/L    Total Bilirubin 0.5 0.3 - 1.2 mg/dL    GFR calc non Af Amer >60 >60 mL/min    GFR calc Af Amer >60 >60 mL/min    Anion gap 11 5 - 15      Comment: Performed at [facility], [address] [place], [place] [address]  Lipase, blood     Status: None    Collection Time: 01/11/20  7:28 AM  Result Value Ref Range    Lipase 21 11 - 51 U/L      Comment: Performed at [facility], [address] [place], [place] [address]  Urinalysis, Routine w reflex microscopic     Status: Abnormal    Collection Time: 01/11/20  7:46 AM  Result Value Ref Range    Color, Urine YELLOW YELLOW    APPearance CLEAR CLEAR    Specific Gravity, Urine 1.021 1.005 - 1.030    pH 5.0 5.0 - 8.0    Glucose, UA  NEGATIVE NEGATIVE mg/dL    Hgb urine dipstick SMALL (A) NEGATIVE    Bilirubin Urine NEGATIVE NEGATIVE    Ketones, ur 5 (A) NEGATIVE mg/dL    Protein, ur 562 (A) NEGATIVE mg/dL    Nitrite NEGATIVE NEGATIVE    Leukocytes,Ua SMALL (A) NEGATIVE    RBC / HPF 6-10 0 - 5 RBC/hpf    WBC, UA 11-20 0 - 5 WBC/hpf    Bacteria, UA RARE (A) NONE SEEN    Squamous Epithelial / LPF 0-5 0 - 5     Mucus PRESENT      Hyaline Casts, UA PRESENT        Comment: Performed at [facility], [address] [place], [place] [address]       Imaging Results (Last 48 hours)  CT ABDOMEN PELVIS WO CONTRAST   Result Date: 01/11/2020 CLINICAL DATA:  Suspected bowel obstruction EXAM: CT ABDOMEN AND PELVIS WITHOUT CONTRAST TECHNIQUE: Multidetector CT imaging of the abdomen and pelvis was performed following the standard protocol without IV contrast. COMPARISON:  Bowel obstruction suspected FINDINGS: Lower chest: Interval clearance of some patchy airspace disease in the right middle lobe seen on recent comparison exam. Lung bases are clear. Cardiac size is within normal limits. Small volume pericardial effusion. Extensive coronary artery calcifications are noted. Distal thoracic aorta is heavily calcified as well. Hepatobiliary: Stable appearance of the bilobed hepatic cyst in the right lobe liver measuring up to 2.6 cm in size. Patient is post cholecystectomy with both intra and extrahepatic biliary ductal dilatation greater than expected for the post cholecystectomy state but with resolution of the pneumobilia seen on the comparison study. Pancreas: Mild pancreatic atrophy. No pancreatic ductal dilatation. Spleen: Normal in size without focal abnormality. Adrenals/Urinary Tract: Normal adrenal glands. Bilateral renal cysts are unchanged from prior. No new hydronephrosis or visible urolithiasis along the expected course of either ureter. Urinary bladder is largely decompressed at the time of exam and therefore poorly evaluated by CT imaging. Stomach/Bowel: Distal esophagus is unremarkable. The stomach is moderately distended. There is diffuse small bowel air and fluid distention beginning at the level of the duodenum with mild narrowing as the duodenum courses past ear to the IMA but with immediate resumption to the level of a focal region of tapering in right lower quadrant. Several bowel loops  change caliber in this location concerning for possible adhesion. Distal small bowel is largely decompressed. Moderate volume of stool throughout the colon. No frank colonic thickening or dilatation. Vascular/Lymphatic: Extensive atherosclerotic calcification of the abdominal aorta and branch vessels. Luminal evaluation precluded in the absence of contrast media. Reproductive: Anteverted uterus. Few calcifications may reflect calcified uterine fibroids. Adnexa are difficult to evaluate the absence of contrast and with extensive bowel dilatation. Ovaries are not clearly discernible. Other: Small to moderate volume low-attenuation ascites is seen layering in the subphrenic space. No free abdominopelvic air. No bowel containing hernias. Mild body wall edema. Musculoskeletal: Multilevel degenerative changes are present in the imaged portions of the spine. No acute osseous abnormality or suspicious osseous lesion. Mild grade 1 anterolisthesis L3 on 4. No spondylolysis. IMPRESSION: 1. Diffuse small bowel air and fluid distention beginning at the level of the duodenum with mild narrowing as the duodenum courses posterior to the IMA but with immediate resumption in continuation to the level of a focal region of tapering in the right lower quadrant. Distal small bowel is largely decompressed. Findings are compatible with bowel obstruction possibly related to an adhesion. 2. Small to  moderate volume low-attenuation ascites layering in the subphrenic space. No free abdominopelvic air. Additional circumferential body wall edema may reflect volume third-spacing. 3. Interval clearance of some patchy airspace disease in the right middle lobe seen on recent comparison exam. 4. Intra and extrahepatic biliary ductal dilatation, slightly greater than expected for the post cholecystectomy state. If there is concern for biliary obstruction recommend correlation with serologies and further evaluation with MRCP. 5. Aortic Atherosclerosis  (ICD10-I70.0). Electronically Signed   By: Lovena Le M.D.   On: 01/11/2020 06:41           Assessment/Plan Chronic abdominal pain - hydrocodone use since 01/25/18 Hypertension LVH Mitral Murmur Aortic sclerosis/bruit Hyperlipidemia Hypokalemia   Recurrent SBO Hx of colitis 10/30/19 Laparoscopic cholecystectomy 09/05/19, Dr. Marcello Moores Tyner Codner ERCP, removal of sludge, sphincterotomy, stent placement, 09/06/2020 Dr. Clarene Essex IR drain placement x 2 -09/11/19   FEN: N.p.o./NG tube IV fluids ID: None; cefotetan preop DVT: Heparin  Follow-up: TBD   Plan:  We will admit, place an NG tube, IV fluids, ask Medicine to consult and clear for surgery tomorrow.      Earnstine Regal, [facility] 01/11/2020, 8:25 AM Please see Amion for pager number during day hours 7:00am-4:30pm     Pt seen, examined and agree with above  Recurrent SBO  Failed attempt at nonoperative management Recommend  EX LAP for Thursday Admit/ IVF/ NGT Asked for medical consultation

## 2020-01-11 NOTE — Progress Notes (Signed)
Called patient's daughter, Desta Bujak, to inquire if anyone has discussed the patient's surgery with her and,  If so, to obtain consent. Left message.

## 2020-01-11 NOTE — Progress Notes (Signed)
Spoke with daughter and updated her about her mother.  She said if consent was needed for surgery, if surgery was going to happen,she could grant it.

## 2020-01-11 NOTE — ED Triage Notes (Signed)
Patient was up for admission at [facility] and patient states she left because it was crowded. Patient is complaining of abdominal pain and vomiting.

## 2020-01-11 NOTE — ED Notes (Signed)
Xray at bedside for verification of NG placement.

## 2020-01-11 NOTE — ED Notes (Signed)
Unsuccessful IV attempt.

## 2020-01-12 ENCOUNTER — Inpatient Hospital Stay (HOSPITAL_COMMUNITY): Payer: Medicare PPO

## 2020-01-12 ENCOUNTER — Inpatient Hospital Stay (HOSPITAL_COMMUNITY): Payer: Medicare PPO | Admitting: Anesthesiology

## 2020-01-12 ENCOUNTER — Encounter (HOSPITAL_COMMUNITY): Payer: Self-pay

## 2020-01-12 ENCOUNTER — Encounter (HOSPITAL_COMMUNITY): Admission: EM | Disposition: A | Discharge status: Home Health | Payer: Self-pay | Source: Home / Self Care

## 2020-01-12 HISTORY — PX: LAPAROTOMY: SHX154

## 2020-01-12 LAB — CBC
HCT: 32 % — ABNORMAL LOW (ref 36.0–46.0)
HCT: 38.2 % (ref 36.0–46.0)
Hemoglobin: 10.7 g/dL — ABNORMAL LOW (ref 12.0–15.0)
Hemoglobin: 12.5 g/dL (ref 12.0–15.0)
MCH: 34.9 pg — ABNORMAL HIGH (ref 26.0–34.0)
MCH: 34.9 pg — ABNORMAL HIGH (ref 26.0–34.0)
MCHC: 33.4 g/dL (ref 30.0–36.0)
MCHC: 32.7 g/dL (ref 30.0–36.0)
MCV: 104.2 fL — ABNORMAL HIGH (ref 80.0–100.0)
MCV: 106.7 fL — ABNORMAL HIGH (ref 80.0–100.0)
Platelets: 326 10*3/uL (ref 150–400)
Platelets: 274 10*3/uL (ref 150–400)
RBC: 3.07 MIL/uL — ABNORMAL LOW (ref 3.87–5.11)
RBC: 3.58 MIL/uL — ABNORMAL LOW (ref 3.87–5.11)
RDW: 14.6 % (ref 11.5–15.5)
RDW: 14.8 % (ref 11.5–15.5)
WBC: 6.3 10*3/uL (ref 4.0–10.5)
WBC: 2.2 10*3/uL — ABNORMAL LOW (ref 4.0–10.5)
nRBC: 0 % (ref 0.0–0.2)
nRBC: 0 % (ref 0.0–0.2)

## 2020-01-12 LAB — BASIC METABOLIC PANEL
Anion gap: 6 (ref 5–15)
BUN: 12 mg/dL (ref 8–23)
CO2: 26 mmol/L (ref 22–32)
Calcium: 9.1 mg/dL (ref 8.9–10.3)
Chloride: 112 mmol/L — ABNORMAL HIGH (ref 98–111)
Creatinine, Ser: 0.78 mg/dL (ref 0.44–1.00)
GFR calc Af Amer: >60 mL/min (ref >60)
GFR calc non Af Amer: >60 mL/min (ref >60)
Glucose, Bld: 118 mg/dL — ABNORMAL HIGH (ref 70–99)
Potassium: 3.9 mmol/L (ref 3.5–5.1)
Sodium: 144 mmol/L (ref 135–145)

## 2020-01-12 LAB — SURGICAL PCR SCREEN
MRSA, PCR: NEGATIVE
Staphylococcus aureus: NEGATIVE

## 2020-01-12 LAB — PROTIME-INR
INR: 1 (ref 0.8–1.2)
Prothrombin Time: 13.3 seconds (ref 11.4–15.2)

## 2020-01-12 LAB — TYPE AND SCREEN
ABO/RH(D): B POS
Antibody Screen: NEGATIVE

## 2020-01-12 LAB — ABO/RH: ABO/RH(D): B POS

## 2020-01-12 LAB — PREALBUMIN: Prealbumin: 18.8 mg/dL (ref 18–38)

## 2020-01-12 LAB — MAGNESIUM: Magnesium: 1.9 mg/dL (ref 1.7–2.4)

## 2020-01-12 SURGERY — LAPAROTOMY, EXPLORATORY
Anesthesia: General | Site: Abdomen

## 2020-01-12 MED ORDER — ACETAMINOPHEN 10 MG/ML IV SOLN
INTRAVENOUS | Status: AC
  Filled 2020-01-12: qty 100

## 2020-01-12 MED ORDER — HYDROMORPHONE HCL 1 MG/ML IJ SOLN
0.5000 mg | Freq: Once | INTRAMUSCULAR | Status: AC
  Administered 2020-01-12: 0.5 mg via INTRAVENOUS

## 2020-01-12 MED ORDER — HYDROMORPHONE HCL 1 MG/ML IJ SOLN
INTRAMUSCULAR | Status: AC
  Filled 2020-01-12: qty 1

## 2020-01-12 MED ORDER — PROPOFOL 10 MG/ML IV BOLUS
INTRAVENOUS | Status: DC | PRN
  Administered 2020-01-12: 100 mg via INTRAVENOUS
  Administered 2020-01-12: 25 mg via INTRAVENOUS

## 2020-01-12 MED ORDER — SODIUM CHLORIDE 0.9 % IV SOLN: 2.0000 g | INTRAVENOUS | Status: DC

## 2020-01-12 MED ORDER — ONDANSETRON HCL 4 MG/2ML IJ SOLN
INTRAMUSCULAR | Status: DC | PRN
  Administered 2020-01-12: 4 mg via INTRAVENOUS

## 2020-01-12 MED ORDER — FENTANYL CITRATE (PF) 250 MCG/5ML IJ SOLN
INTRAMUSCULAR | Status: AC
  Filled 2020-01-12: qty 5

## 2020-01-12 MED ORDER — ESMOLOL HCL 100 MG/10ML IV SOLN
INTRAVENOUS | Status: AC
  Filled 2020-01-12: qty 20

## 2020-01-12 MED ORDER — DIPHENHYDRAMINE HCL 12.5 MG/5ML PO ELIX: 12.5000 mg | ORAL_SOLUTION | Freq: Four times a day (QID) | ORAL | Status: DC | PRN

## 2020-01-12 MED ORDER — 0.9 % SODIUM CHLORIDE (POUR BTL) OPTIME
TOPICAL | Status: DC | PRN
  Administered 2020-01-12: 2000 mL

## 2020-01-12 MED ORDER — FENTANYL CITRATE (PF) 100 MCG/2ML IJ SOLN
INTRAMUSCULAR | Status: AC
  Filled 2020-01-12: qty 2

## 2020-01-12 MED ORDER — ONDANSETRON HCL 4 MG/2ML IJ SOLN: 4.0000 mg | Freq: Four times a day (QID) | INTRAMUSCULAR | Status: DC | PRN

## 2020-01-12 MED ORDER — NALOXONE HCL 0.4 MG/ML IJ SOLN: 0.4000 mg | INTRAMUSCULAR | Status: DC | PRN

## 2020-01-12 MED ORDER — ALBUMIN HUMAN 5 % IV SOLN
12.5000 g | Freq: Once | INTRAVENOUS | Status: AC
  Administered 2020-01-12: 17:00:00 12.5 g via INTRAVENOUS

## 2020-01-12 MED ORDER — SODIUM CHLORIDE 0.9% FLUSH: 9.0000 mL | INTRAVENOUS | Status: DC | PRN

## 2020-01-12 MED ORDER — DIPHENHYDRAMINE HCL 50 MG/ML IJ SOLN: 12.5000 mg | Freq: Four times a day (QID) | INTRAMUSCULAR | Status: DC | PRN

## 2020-01-12 MED ORDER — LIDOCAINE 2% (20 MG/ML) 5 ML SYRINGE
INTRAMUSCULAR | Status: DC | PRN
  Administered 2020-01-12: 60 mg via INTRAVENOUS
  Administered 2020-01-12: 1.5 mg/kg/h via INTRAVENOUS

## 2020-01-12 MED ORDER — VASOPRESSIN 20 UNIT/ML IV SOLN
INTRAVENOUS | Status: AC
  Filled 2020-01-12: qty 1

## 2020-01-12 MED ORDER — HYDROMORPHONE 1 MG/ML IV SOLN: INTRAVENOUS | Status: DC

## 2020-01-12 MED ORDER — ESMOLOL HCL 100 MG/10ML IV SOLN
INTRAVENOUS | Status: DC | PRN
  Administered 2020-01-12: 10 mg via INTRAVENOUS
  Administered 2020-01-12: 5 mg via INTRAVENOUS
  Administered 2020-01-12: 15 mg via INTRAVENOUS
  Administered 2020-01-12: 10 mg via INTRAVENOUS
  Administered 2020-01-12: 15 mg via INTRAVENOUS

## 2020-01-12 MED ORDER — PHENYLEPHRINE 40 MCG/ML (10ML) SYRINGE FOR IV PUSH (FOR BLOOD PRESSURE SUPPORT)
PREFILLED_SYRINGE | INTRAVENOUS | Status: DC | PRN
  Administered 2020-01-12: 40 ug via INTRAVENOUS
  Administered 2020-01-12 (×2): 80 ug via INTRAVENOUS
  Administered 2020-01-12: 40 ug via INTRAVENOUS
  Administered 2020-01-12 (×3): 80 ug via INTRAVENOUS

## 2020-01-12 MED ORDER — PHENYLEPHRINE 40 MCG/ML (10ML) SYRINGE FOR IV PUSH (FOR BLOOD PRESSURE SUPPORT)
PREFILLED_SYRINGE | INTRAVENOUS | Status: AC
  Filled 2020-01-12: qty 10

## 2020-01-12 MED ORDER — HYDROMORPHONE HCL 1 MG/ML IJ SOLN
0.2500 mg | INTRAMUSCULAR | Status: DC | PRN
  Administered 2020-01-12 (×4): 0.5 mg via INTRAVENOUS

## 2020-01-12 MED ORDER — DEXAMETHASONE SODIUM PHOSPHATE 10 MG/ML IJ SOLN
INTRAMUSCULAR | Status: DC | PRN
  Administered 2020-01-12: 5 mg via INTRAVENOUS

## 2020-01-12 MED ORDER — LACTATED RINGERS IV SOLN: INTRAVENOUS | Status: DC | PRN

## 2020-01-12 MED ORDER — ACETAMINOPHEN 10 MG/ML IV SOLN
1000.0000 mg | Freq: Once | INTRAVENOUS | Status: AC
  Administered 2020-01-12: 1000 mg via INTRAVENOUS

## 2020-01-12 MED ORDER — ROCURONIUM BROMIDE 10 MG/ML (PF) SYRINGE
PREFILLED_SYRINGE | INTRAVENOUS | Status: DC | PRN
  Administered 2020-01-12 (×2): 5 mg via INTRAVENOUS
  Administered 2020-01-12: 50 mg via INTRAVENOUS
  Administered 2020-01-12: 5 mg via INTRAVENOUS

## 2020-01-12 MED ORDER — FENTANYL CITRATE (PF) 100 MCG/2ML IJ SOLN
25.0000 ug | INTRAMUSCULAR | Status: DC | PRN
  Administered 2020-01-12 (×2): 25 ug via INTRAVENOUS
  Administered 2020-01-12 (×2): 50 ug via INTRAVENOUS

## 2020-01-12 MED ORDER — SUCCINYLCHOLINE CHLORIDE 200 MG/10ML IV SOSY
PREFILLED_SYRINGE | INTRAVENOUS | Status: DC | PRN
  Administered 2020-01-12: 80 mg via INTRAVENOUS

## 2020-01-12 MED ORDER — SUGAMMADEX SODIUM 200 MG/2ML IV SOLN
INTRAVENOUS | Status: DC | PRN
  Administered 2020-01-12: 200 mg via INTRAVENOUS

## 2020-01-12 MED ORDER — ACETAMINOPHEN 10 MG/ML IV SOLN
1000.0000 mg | Freq: Four times a day (QID) | INTRAVENOUS | Status: AC
  Administered 2020-01-12 – 2020-01-13 (×3): 1000 mg via INTRAVENOUS
  Filled 2020-01-12 (×4): qty 100

## 2020-01-12 MED ORDER — ALBUMIN HUMAN 5 % IV SOLN
INTRAVENOUS | Status: AC
  Filled 2020-01-12: qty 250

## 2020-01-12 MED ORDER — ONDANSETRON HCL 4 MG/2ML IJ SOLN: 4.0000 mg | Freq: Once | INTRAMUSCULAR | Status: DC | PRN

## 2020-01-12 SURGICAL SUPPLY — 43 items
APPLICATOR COTTON TIP 6 STRL (MISCELLANEOUS) ×1 IMPLANT
APPLICATOR COTTON TIP 6IN STRL (MISCELLANEOUS) ×3
BLADE EXTENDED COATED 6.5IN (ELECTRODE) IMPLANT
BLADE HEX COATED 2.75 (ELECTRODE) ×3 IMPLANT
COVER MAYO STAND STRL (DRAPES) IMPLANT
COVER WAND RF STERILE (DRAPES) IMPLANT
DRAPE LAPAROSCOPIC ABDOMINAL (DRAPES) ×3 IMPLANT
DRAPE WARM FLUID 44X44 (DRAPES) IMPLANT
DRSG PAD ABDOMINAL 8X10 ST (GAUZE/BANDAGES/DRESSINGS) ×3 IMPLANT
ELECT REM PT RETURN 15FT ADLT (MISCELLANEOUS) ×3 IMPLANT
GAUZE SPONGE 4X4 12PLY STRL (GAUZE/BANDAGES/DRESSINGS) ×3 IMPLANT
GLOVE BIOGEL PI IND STRL 7.0 (GLOVE) ×1 IMPLANT
GLOVE BIOGEL PI INDICATOR 7.0 (GLOVE) ×2
GLOVE INDICATOR 8.0 STRL GRN (GLOVE) ×6 IMPLANT
GLOVE SS BIOGEL STRL SZ 7.5 (GLOVE) ×1 IMPLANT
GLOVE SUPERSENSE BIOGEL SZ 7.5 (GLOVE) ×2
GOWN STRL REUS W/TWL LRG LVL3 (GOWN DISPOSABLE) ×3 IMPLANT
GOWN STRL REUS W/TWL XL LVL3 (GOWN DISPOSABLE) ×6 IMPLANT
HANDLE SUCTION POOLE (INSTRUMENTS) IMPLANT
KIT BASIN OR (CUSTOM PROCEDURE TRAY) ×3 IMPLANT
KIT TURNOVER KIT A (KITS) IMPLANT
LIGASURE IMPACT 36 18CM CVD LR (INSTRUMENTS) ×3 IMPLANT
NS IRRIG 1000ML POUR BTL (IV SOLUTION) ×3 IMPLANT
PACK GENERAL/GYN (CUSTOM PROCEDURE TRAY) ×3 IMPLANT
PENCIL SMOKE EVACUATOR (MISCELLANEOUS) IMPLANT
RELOAD PROXIMATE 75MM BLUE (ENDOMECHANICALS) ×6 IMPLANT
SPONGE LAP 18X18 RF (DISPOSABLE) IMPLANT
STAPLER GUN LINEAR PROX 60 (STAPLE) ×3 IMPLANT
STAPLER PROXIMATE 75MM BLUE (STAPLE) ×3 IMPLANT
STAPLER VISISTAT 35W (STAPLE) ×3 IMPLANT
SUCTION POOLE HANDLE (INSTRUMENTS)
SUT PDS AB 1 CTX 36 (SUTURE) IMPLANT
SUT SILK 2 0 (SUTURE)
SUT SILK 2-0 18XBRD TIE 12 (SUTURE) IMPLANT
SUT SILK 3 0 (SUTURE)
SUT SILK 3 0 SH CR/8 (SUTURE) IMPLANT
SUT SILK 3-0 18XBRD TIE 12 (SUTURE) IMPLANT
SUT VIC AB 2-0 SH 18 (SUTURE) ×12 IMPLANT
SUT VIC AB 3-0 SH 18 (SUTURE) IMPLANT
TAPE CLOTH SURG 6X10 WHT LF (GAUZE/BANDAGES/DRESSINGS) ×3 IMPLANT
TOWEL OR 17X26 10 PK STRL BLUE (TOWEL DISPOSABLE) ×6 IMPLANT
TRAY FOLEY MTR SLVR 16FR STAT (SET/KITS/TRAYS/PACK) IMPLANT
YANKAUER SUCT BULB TIP NO VENT (SUCTIONS) IMPLANT

## 2020-01-12 NOTE — Anesthesia Preprocedure Evaluation (Addendum)
Anesthesia Evaluation  Patient identified by MRN, date of birth, ID band Patient awake    Reviewed: Allergy & Precautions, NPO status , Patient's Chart, lab work & pertinent test results  Airway Mallampati: III  TM Distance: >3 FB Neck ROM: Full    Dental  (+) Poor Dentition, Missing, Chipped   Pulmonary neg pulmonary ROS,    Pulmonary exam normal breath sounds clear to auscultation       Cardiovascular hypertension, Pt. on medications Normal cardiovascular exam Rhythm:Regular Rate:Normal  ECG: rate 89. Normal sinus rhythm Minimal voltage criteria for LVH, may be normal variant ( Sokolow-Lyon )   Neuro/Psych negative neurological ROS  negative psych ROS   GI/Hepatic negative GI ROS, Neg liver ROS,   Endo/Other  negative endocrine ROS  Renal/GU negative Renal ROS     Musculoskeletal negative musculoskeletal ROS (+)   Abdominal   Peds  Hematology  (+) anemia , HLD   Anesthesia Other Findings small bowel obstrluction  Reproductive/Obstetrics                            Anesthesia Physical Anesthesia Plan  ASA: II  Anesthesia Plan: General   Post-op Pain Management:    Induction: Intravenous and Rapid sequence  PONV Risk Score and Plan: 4 or greater and Ondansetron, Dexamethasone and Treatment may vary due to age or medical condition  Airway Management Planned: Oral ETT  Additional Equipment:   Intra-op Plan:   Post-operative Plan: Extubation in OR  Informed Consent: I have reviewed the patients History and Physical, chart, labs and discussed the procedure including the risks, benefits and alternatives for the proposed anesthesia with the patient or authorized representative who has indicated his/her understanding and acceptance.     Dental advisory given  Plan Discussed with: CRNA  Anesthesia Plan Comments:        Anesthesia Quick Evaluation

## 2020-01-12 NOTE — Op Note (Signed)
Preoperative diagnosis: Small bowel obstruction  Postoperative diagnosis: Same  Procedure: Exporter laparotomy with small bowel resection and lysis of adhesions due to distal high-grade small bowel obstruction secondary to adhesions  Surgeon: Erroll Luna, MD  Assistant: Will Creig Hines, PA-C  Anesthesia: General  EBL: 20 cc  Specimen: Distal jejunum and proximal ileum to pathology  Drains: None  IV fluids: Per anesthesia record  Indications for procedure: The patient is a [age]-year-old female who is had recurrent partial small bowel obstructions.  Last was 2 weeks ago which she recovered after failure of nonoperative management.  She decompressed some but continued to have signs of persistent small bowel obstruction.  I discussed surgery with the patient and her daughter.  Discussed laparotomy with lysis of adhesions.  Discussed possible bowel resection and the need for further operations.  Discussed nonoperative management but she has failed that on multiple occasions.The procedure has been discussed with the patient.  Alternative therapies have been discussed with the patient.  Operative risks include bleeding,  Infection,  Organ injury,  Nerve injury,  Blood vessel injury,  DVT,  Pulmonary embolism,  Death,  And possible reoperation.  Medical management risks include worsening of present situation.  The success of the procedure is 50 -90 % at treating patients symptoms.  The patient understands and agrees to proceed.    Description of procedure: The patient was met in the holding area.  Questions were answered.  Procedure reviewed and daughter informed of well.  She was taken back to the operating room and placed upon the OR table.  After induction of general esthesia, Foley cath was placed and the abdomen is prepped and draped in sterile fashion.  Timeout was performed.  Patient procedure were verified.  A midline incision was used.  This was started just below the umbilicus and  continued down toward the pubic symphysis.  The midline fascia identified open.  Adhesions were taken down from the abdominal wall.  I was then able to place a retractor.  The small bowel was run from the ligament of Treitz and continued down into the pelvis where it was densely adhesed to the left lower pelvic sidewall.  Were able to lyse adhesions but she had chronic adhesions involving her distal jejunum down to the left pelvic sidewall.  The bowel was contracted.  Multiple areas were lysed and brought up into the field.  One area was significantly thin at the left pelvic brim.  Once the bowel was fully mobilized up in the field there were small areas of oozing from the wall.  One area was extremely thin and adhesive the left pelvic sidewall. This was contracted and stenotic. The remainder of the ileum was run to the ileocecal valve.  The ascending colon, appendix, transverse colon, descending colon, rectosigmoid junction as well as sigmoid colon were all intact.  This was carefully examined since there was significant amount of adhesive disease between the small bowel and the sigmoid colon and proximal rectum.  No evidence of injury to the colon noted.  The area of stenotic small bowel was resected.  This was at the distal jejunum to the proximal ileum.  This was about a total of 50 cm.  The proximal portion was taken down with a GIA 75 stapler.  As was the distal portion.  LigaSure used to take the mesentery down.  A side-to-side functional end-to-end anastomosis was created with the GIA-75 and TA 60.  This removed the most adhesed stenotic portion of the small  bowel that was encountered in the pelvis.  The remainder the small bowel was examined.  The areas where there was some oozing from the serosa that was oversewn and was hemostatic.  There is no evidence of leak.  The bowel was run again from the ligament of Treitz back to the ileocecal valve a third time.  The anastomosis was intact.  There is no tension  of the anastomosis.  There is ample room for contents to pass through it.  Of note a crotch stitch was placed as well as closure of the mesentery at this portion.  The other areas of repair of the oozing serosa were noted.  There is no evidence of bowel injury or leakage or significant stenosis.  Irrigation was used.  4 L total were used to irrigate out the pelvis and abdominal cavity.  This was suctioned out.  Counts were found to be correct.  Fascia closed with single strand of 1 PDS.  Skin packed open.  Dry dressing applied.  All counts were found to be correct.  The patient was then awoke extubated taken to recovery in satisfactory condition.

## 2020-01-12 NOTE — Anesthesia Procedure Notes (Signed)
Procedure Name: Intubation Date/Time: 01/12/2020 12:32 PM Performed by: Lavina Hamman, CRNA Pre-anesthesia Checklist: Patient identified, Emergency Drugs available, Suction available, Patient being monitored and Timeout performed Patient Re-evaluated:Patient Re-evaluated prior to induction Oxygen Delivery Method: Circle system utilized Preoxygenation: Pre-oxygenation with 100% oxygen Induction Type: IV induction Ventilation: Mask ventilation without difficulty Laryngoscope Size: Mac and 4 Grade View: Grade II Tube type: Oral Tube size: 7.5 mm Number of attempts: 1 Airway Equipment and Method: Stylet Placement Confirmation: ETT inserted through vocal cords under direct vision,  positive ETCO2,  CO2 detector and breath sounds checked- equal and bilateral Secured at: 22 cm Tube secured with: Tape Dental Injury: Teeth and Oropharynx as per pre-operative assessment  Comments: ATOI, teeth very poor condition but unchanged.

## 2020-01-12 NOTE — Anesthesia Postprocedure Evaluation (Signed)
Anesthesia Post Note  Patient: RESHONDA KOERBER  Procedure(s) Performed: EXPLORATORY LAPAROTOMY LYSIS OF ADHESIONS AND SMALL BOWEL OBSTRUCTION (N/A Abdomen)     Patient location during evaluation: PACU Anesthesia Type: General Level of consciousness: awake Pain management: pain level controlled Vital Signs Assessment: post-procedure vital signs reviewed and stable Respiratory status: spontaneous breathing, nonlabored ventilation, respiratory function stable and patient connected to nasal cannula oxygen Cardiovascular status: blood pressure returned to baseline and stable Postop Assessment: no apparent nausea or vomiting Anesthetic complications: no    Last Vitals:  Vitals:   01/12/20 1530 01/12/20 1545  BP: 122/62 120/61  Pulse: (!) 123 (!) 124  Resp: 12 11  Temp:    SpO2: 100% 100%    Last Pain:  Vitals:   01/12/20 1500  TempSrc:   PainSc: Asleep                 Ilhan Debenedetto P Atlee Villers

## 2020-01-12 NOTE — Progress Notes (Signed)
Initial Nutrition Assessment  RD working remotely.  DOCUMENTATION CODES:   Not applicable  INTERVENTION:  - recommend TPN initiation.  - diet advancement as medically feasible.   NUTRITION DIAGNOSIS:   Inadequate oral intake related to inability to eat as evidenced by NPO status.  GOAL:   Patient will meet greater than or equal to 90% of their needs  MONITOR:   Labs, Weight trends  REASON FOR ASSESSMENT:   Malnutrition Screening Tool  ASSESSMENT:   79 year old female who was hospitalized -3/9 with SBO. She has medical history of HTN, hyperlipidemia, prior tubal ligation, and laparoscopic cholecystectomy on 09/05/19. She also underwent ERCP, sphincterotomy, and stent placement with sludge removal on 09/07/19.  Patient has been NPO since midnight. She was last assessed by a RD on 09/09/19 at which time she met criteria for moderate/non-severe malnutrition; suspect that this is ongoing given medical hx and course. Per chart review, weight yesterday was 160 lb and weight on 09/07/19 was 179 lb. This indicates 19 lb weight loss (11% body weight) in the past 4 months. This is significant for time frame although, of note, admission weight yesterday appears to be a stated weight.   Per notes: - NGT to LIS with 3L output on 3/17 - imaging shows improvement in SBO - plan for surgery   Labs reviewed; Cl: 112 mmol/l. Medications reviewed. IVF; D5-30 mEq KCl @ 100 ml/hr (408 kcal).     NUTRITION - FOCUSED PHYSICAL EXAM:  unable to complete at this time.   Diet Order:   Diet Order            Diet NPO time specified  Diet effective midnight              EDUCATION NEEDS:   Not appropriate for education at this time  Skin:  Skin Assessment: Reviewed RN Assessment  Last BM:  PTA/unknown  Height:   Ht Readings from Last 1 Encounters:  01/11/20 _0  (1.854 m)    Weight:   Wt Readings from Last 1 Encounters:  01/11/20 72.6 kg    Ideal Body Weight:  75  kg  BMI:  Body mass index is 21.11 kg/m.  Estimated Nutritional Needs:   Kcal:  2230-0979 kcal  Protein:  108-120 grams  Fluid:  >/= 2.2 L/day     Jarome Matin, MS, RD, LDN, CNSC Inpatient Clinical Dietitian RD pager # available in AMION  After hours/weekend pager # available in [facility]

## 2020-01-12 NOTE — Transfer of Care (Addendum)
Immediate Anesthesia Transfer of Care Note  Patient: Samantha Gonzales  Procedure(s) Performed: Procedure(s): EXPLORATORY LAPAROTOMY LYSIS OF ADHESIONS AND SMALL BOWEL OBSTRUCTION (N/A)  Patient Location: PACU  Anesthesia Type:General  Level of Consciousness:  sedated, patient cooperative and responds to stimulation  Airway & Oxygen Therapy:Patient Spontanous Breathing and Patient connected to face mask oxgen  Post-op Assessment:  Report given to PACU RN and Post -op Vital signs reviewed and stable  Post vital signs:  Reviewed and stable  Last Vitals:  Vitals:   01/12/20 1115 01/12/20 1138  BP: (!) 121/56 139/62  Pulse: 70 77  Resp:  16  Temp: 37.3 C 36.9 C  SpO2: 100% 100%    Complications: No apparent anesthesia complications

## 2020-01-12 NOTE — Progress Notes (Signed)
    CC: Abdominal pain  Subjective: She is completely decompressed this morning.  She put 3 L out yesterday, but her abdomen is flat this a.m. she denies any flatus or BM.  Continues to complain of her chronic pain.  Film shows marked improvement.  Objective: Vital signs in last 24 hours: Temp:  [98.1 F (36.7 C)-98.6 F (37 C)] 98.6 F (37 C) (03/18 0647) Pulse Rate:  [74-102] 77 (03/18 0647) Resp:  [16-18] 18 (03/18 0647) BP: (124-176)/(60-80) 124/71 (03/18 0647) SpO2:  [99 %-100 %] 100 % (03/18 0647) Last BM Date: 01/02/20 2219 IV 200 urine 3250 NG recorded Afebrile vital signs are stable K+ 3.9 Prealbumin 18.8 WBC 6.3 H/H 10.7/32 Film this a.m. shows no appreciable bowel dilatation or air-fluid levels no free air appearance is consistent with a resolving bowel obstruction.  Intake/Output from previous day: 03/17 0701 - 03/18 0700 In: 2219.4 [I.V.:1220.4; IV Piggyback:999] Out: 3450 [Urine:200; Emesis/NG output:3250] Intake/Output this shift: No intake/output data recorded.  General appearance: alert, cooperative and no distress Resp: clear to auscultation bilaterally GI: Soft, few bowel sounds, no distention, no flatus or BM.  Lab Results:  Recent Labs    01/11/20 0615 01/12/20 0626  WBC 11.7* 6.3  HGB 13.7 10.7*  HCT 40.7 32.0*  PLT 438* 326    BMET Recent Labs    01/11/20 0728 01/12/20 0626  NA 139 144  K 3.5 3.9  CL 105 112*  CO2 23 26  GLUCOSE 128* 118*  BUN 10 12  CREATININE 0.84 0.78  CALCIUM 9.3 9.1   PT/INR Recent Labs    01/12/20 0626  LABPROT 13.3  INR 1.0    Recent Labs  Lab 01/11/20 0728  AST 21  ALT 12  ALKPHOS 81  BILITOT 0.5  PROT 6.5  ALBUMIN 3.5     Lipase     Component Value Date/Time   LIPASE 21 01/11/2020 0728     Medications: . fentaNYL (SUBLIMAZE) injection  50 mcg Intravenous Once  . heparin  5,000 Units Subcutaneous Q8H  . ondansetron (ZOFRAN) IV  4 mg Intravenous Once     Assessment/Plan Chronic abdominal pain- hydrocodone use since 01/25/18 Hypertension LVH Mitral Murmur Aortic sclerosis/bruit Hyperlipidemia Hypokalemia  Recurrent SBO Hx of colitis 10/30/19 Laparoscopic cholecystectomy 09/05/19, Dr. Maisie Fus Cornett ERCP, removal of sludge,sphincterotomy, stent placement, 09/06/2020 Dr.Marc Magod IR drain placement x 2 -09/11/19  FEN: N.p.o./NG tube IV fluids ID: None;cefotetan preop DVT: Heparin  Follow-up: TBD   Plan: We will discuss with Dr. Luisa Hart prior to recommending surgery.  Will discuss with her daughter after Dr. Luisa Hart has evaluated patient.    LOS: 1 day    Samantha Gonzales 01/12/2020 Please see Amion

## 2020-01-12 NOTE — Progress Notes (Signed)
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 vs long statnd severe HTN with development of LVOT  Gradient, mild mitral valve regurg  EKG (prior) - mild LVH with a prolonged QTc   Recommendations: Per Chales Abrahams perioperative Risk index she has a 0.3% risk of MI or cardiac arrest intraop for up to 30 days post op - I am uncertain of the presence of or extent of LVOT obstruction mentioned on above ECHO but a cardiology opinion may be prudent prior to surgery- I do not feel this should hold up her surgery- she has no symptoms suggestive of cardiac compromise  Prolonged QTC - on prior EKGs- will order a repeat EKG now - she has an NG, avoid anti emetics - check Mg level - keep K > 3.5- agree with K added to continuous IVF    Essential hypertension    Hyperlipidemia - hold Amlodipine and Crestor for now - close watch post op BP- can use PRN Lopressor if BP is uncontrolled and is not improving despite pain medications  Macrocytosis - HB is 13 (may lower as today she may be hemoconcentrated) but MCV 103-104 - will check B12 and Folate levels   Thank you for this consultation.  Our [facility] hospitalist team will follow the patient with you.   HPI/Recap of past 24 hours: Samantha Gonzales is an 79 y.o. female with a h/o HTN, HLD and mild Mitral regurgitation who presents to the hospital with abdominal pain and vomiting.  Recent history:  Lap Cholecystectomy for acute cholecystitis in 11/20  ERCP sphincterotomy with stenting of the ventral pancreatic duct on 09/07/19  Admitted from 12/31 - 10/31/19 for a  colitis   Subsequently was admitted on 12/29/19 to the [facility] practice teaching service for SBO and discharged on 3/9.  Her CT again is showing a SBO and we are asked for medical clearance due to her h/o a murmur. She developed abdominal pain, distension and mild vomiting a couple of days ago. No hematemesis. She can't recall her last BM. NG tube in place to suction with moderate ouput.  01/12/20:  Seen and examined this AM.  Pain persistent and worse with movements. Possible surgery.  We'll continue to follow for support.    Assessment/Plan: Principal Problem:   Small bowel obstruction (HCC) Active Problems:   Essential hypertension   Murmur, heart   SBO (small bowel obstruction) (HCC)     Objective: Vitals:   01/12/20 1138 01/12/20 1430 01/12/20 1445 01/12/20 1500  BP: 139/62 (!) 109/91 (!) 110/57   Pulse: 77 95  (!) 110  Resp: 16 20 18 17   Temp: 98.4 F (36.9 C) 98 F (36.7 C)    TempSrc: Oral     SpO2: 100% 100% 100% 100%  Weight:      Height:        Intake/Output Summary (Last 24 hours) at 01/12/2020 1503 Last data filed at 01/12/2020 1356 Gross per 24 hour  Intake 2745.52 ml  Output 2650 ml  Net 95.52 ml   Filed Weights   01/11/20 0317  Weight: 72.6 kg    Exam:  . General: 78  y.o. year-old female well developed well nourished in no acute distress.  Alert and interactive. . Cardiovascular: Regular rate and rhythm with no rubs or gallops.  No thyromegaly or JVD noted.   Marland Kitchen Respiratory: Clear to auscultation with no wheezes or rales.  . Abdomen: Diffused tenderness with palpation. Difficult to appreciate bowel sounds. . Musculoskeletal: No lower extremity edema bilaterally . Psychiatry: Mood is appropriate for condition and setting   Data Reviewed: CBC: Recent Labs  Lab 01/11/20 0615 01/12/20 0626  WBC 11.7* 6.3  NEUTROABS 10.7*  --   HGB 13.7 10.7*  HCT 40.7 32.0*  MCV 103.8* 104.2*  PLT 438* 326   Basic Metabolic Panel: Recent Labs  Lab  01/11/20 0728 01/11/20 1213 01/12/20 0626  NA 139  --  144  K 3.5  --  3.9  CL 105  --  112*  CO2 23  --  26  GLUCOSE 128*  --  118*  BUN 10  --  12  CREATININE 0.84  --  0.78  CALCIUM 9.3  --  9.1  MG  --  2.0 1.9   GFR: Estimated Creatinine Clearance: 68.6 mL/min (by C-G formula based on SCr of 0.78 mg/dL). Liver Function Tests: Recent Labs  Lab 01/11/20 0728  AST 21  ALT 12  ALKPHOS 81  BILITOT 0.5  PROT 6.5  ALBUMIN 3.5   Recent Labs  Lab 01/11/20 0728  LIPASE 21   No results for input(s): AMMONIA in the last 168 hours. Coagulation Profile: Recent Labs  Lab 01/12/20 0626  INR 1.0   Cardiac Enzymes: No results for input(s): CKTOTAL, CKMB, CKMBINDEX, TROPONINI in the last 168 hours. BNP (last 3 results) No results for input(s): PROBNP in the last 8760 hours. HbA1C: No results for input(s): HGBA1C in the last 72 hours. CBG: No results for input(s): GLUCAP in the last 168 hours. Lipid Profile: No results for input(s): CHOL, HDL, LDLCALC, TRIG, CHOLHDL, LDLDIRECT in the last 72 hours. Thyroid Function Tests: No results for input(s): TSH, T4TOTAL, FREET4, T3FREE, THYROIDAB in the last 72 hours. Anemia Panel: Recent Labs    01/11/20 1213  VITAMINB12 214  FOLATE 4.8*  FERRITIN 148  TIBC 310  IRON 26*  RETICCTPCT 0.8   Urine analysis:    Component Value Date/Time   COLORURINE YELLOW 01/11/2020 0746   APPEARANCEUR CLEAR 01/11/2020 0746   LABSPEC 1.021 01/11/2020 0746   PHURINE 5.0 01/11/2020 0746   GLUCOSEU NEGATIVE 01/11/2020 0746   HGBUR SMALL (A) 01/11/2020 0746   BILIRUBINUR NEGATIVE 01/11/2020 0746   KETONESUR 5 (A) 01/11/2020 0746   PROTEINUR 100 (A) 01/11/2020 0746   NITRITE NEGATIVE 01/11/2020 0746   LEUKOCYTESUR SMALL (A) 01/11/2020 0746   Sepsis Labs: @LABRCNTIP (procalcitonin:4,lacticidven:4)  ) Recent Results (from the past 240 hour(s))  Respiratory Panel by RT PCR (Flu A&B, Covid) - Nasopharyngeal Swab     Status: None    Collection Time: 01/11/20  7:28 AM   Specimen: Nasopharyngeal Swab  Result Value Ref Range Status   SARS Coronavirus 2 by RT PCR NEGATIVE NEGATIVE Final    Comment: (NOTE) SARS-CoV-2 target nucleic acids are NOT DETECTED. The SARS-CoV-2 RNA is generally detectable in upper respiratoy specimens during the acute phase of infection. The lowest concentration of SARS-CoV-2 viral copies this assay can detect is 131 copies/mL. A negative result does not preclude SARS-Cov-2 infection and should not be used as the sole basis for treatment or other patient management decisions. A negative result may occur with  improper specimen  collection/handling, submission of specimen other than nasopharyngeal swab, presence of viral mutation(s) within the areas targeted by this assay, and inadequate number of viral copies (<131 copies/mL). A negative result must be combined with clinical observations, patient history, and epidemiological information. The expected result is Negative. Fact Sheet for Patients:  PinkCheek.be Fact Sheet for Healthcare Providers:  GravelBags.it This test is not yet ap proved or cleared by the [place] FDA and  has been authorized for detection and/or diagnosis of SARS-CoV-2 by FDA under an Emergency Use Authorization (EUA). This EUA will remain  in effect (meaning this test can be used) for the duration of the COVID-19 declaration under Section 564(b)(1) of the Act, 21 U.S.C. section 360bbb-3(b)(1), unless the authorization is terminated or revoked sooner.    Influenza A by PCR NEGATIVE NEGATIVE Final   Influenza B by PCR NEGATIVE NEGATIVE Final    Comment: (NOTE) The Xpert Xpress SARS-CoV-2/FLU/RSV assay is intended as an aid in  the diagnosis of influenza from Nasopharyngeal swab specimens and  should not be used as a sole basis for treatment. Nasal washings and  aspirates are unacceptable for Xpert Xpress  SARS-CoV-2/FLU/RSV  testing. Fact Sheet for Patients: PinkCheek.be Fact Sheet for Healthcare Providers: GravelBags.it This test is not yet approved or cleared by the [place] FDA and  has been authorized for detection and/or diagnosis of SARS-CoV-2 by  FDA under an Emergency Use Authorization (EUA). This EUA will remain  in effect (meaning this test can be used) for the duration of the  Covid-19 declaration under Section 564(b)(1) of the Act, 21  U.S.C. section 360bbb-3(b)(1), unless the authorization is  terminated or revoked. Performed at [facility], [place] [address], [place], [place] [address]   Surgical pcr screen     Status: None   Collection Time: 01/12/20 11:20 AM   Specimen: Nasal Mucosa; Nasal Swab  Result Value Ref Range Status   MRSA, PCR NEGATIVE NEGATIVE Final   Staphylococcus aureus NEGATIVE NEGATIVE Final    Comment: (NOTE) The Xpert SA Assay (FDA approved for NASAL specimens in patients 98 years of age and older), is one component of a comprehensive surveillance program. It is not intended to diagnose infection nor to guide or monitor treatment. Performed at [facility], [place] [address], [place], [place] [address]       Studies: DG Abd Portable 1V  Result Date: 01/12/2020 CLINICAL DATA:  Small bowel obstruction EXAM: PORTABLE ABDOMEN - 1 VIEW COMPARISON:  January 11, 2020 FINDINGS: Nasogastric tube tip and side port are in the stomach. Currently there is no appreciable bowel dilatation or air-fluid levels. No free air. There are surgical clips in the right upper quadrant. Visualized lung bases are clear. IMPRESSION: Nasogastric tube tip and side port are in the stomach. No appreciable bowel dilatation or or air-fluid level currently. No free air. Appearance consistent with resolving bowel obstruction. Lung bases clear. Electronically Signed   By: Lowella Grip III  M.D.   On: 01/12/2020 08:44    Scheduled Meds: . fentaNYL      . fentaNYL      . [MAR Hold] heparin  5,000 Units Subcutaneous Q8H  . [MAR Hold] ondansetron (ZOFRAN) IV  4 mg Intravenous Once    Continuous Infusions: . dextrose 5 % lactated ringers with kcl 100 mL/hr at 01/12/20 0011     LOS: 1 day     Kayleen Memos, MD [facility] Pager [phone]  If 7PM-7AM, please contact night-coverage [email or web address]  Password [email or web address] 01/12/2020, 3:03 PM

## 2020-01-13 ENCOUNTER — Inpatient Hospital Stay: Payer: Self-pay

## 2020-01-13 LAB — COMPREHENSIVE METABOLIC PANEL
ALT: 10 U/L (ref 0–44)
AST: 14 U/L — ABNORMAL LOW (ref 15–41)
Albumin: 2.6 g/dL — ABNORMAL LOW (ref 3.5–5.0)
Alkaline Phosphatase: 42 U/L (ref 38–126)
Anion gap: 8 (ref 5–15)
BUN: 15 mg/dL (ref 8–23)
CO2: 24 mmol/L (ref 22–32)
Calcium: 8.5 mg/dL — ABNORMAL LOW (ref 8.9–10.3)
Chloride: 111 mmol/L (ref 98–111)
Creatinine, Ser: 1.32 mg/dL — ABNORMAL HIGH (ref 0.44–1.00)
GFR calc Af Amer: 45 mL/min — ABNORMAL LOW (ref >60)
GFR calc non Af Amer: 39 mL/min — ABNORMAL LOW (ref >60)
Glucose, Bld: 140 mg/dL — ABNORMAL HIGH (ref 70–99)
Potassium: 3.9 mmol/L (ref 3.5–5.1)
Sodium: 143 mmol/L (ref 135–145)
Total Bilirubin: 0.3 mg/dL (ref 0.3–1.2)
Total Protein: 4.6 g/dL — ABNORMAL LOW (ref 6.5–8.1)

## 2020-01-13 LAB — PHOSPHORUS: Phosphorus: 3.9 mg/dL (ref 2.5–4.6)

## 2020-01-13 LAB — CBC
HCT: 38.5 % (ref 36.0–46.0)
Hemoglobin: 12.6 g/dL (ref 12.0–15.0)
MCH: 34.8 pg — ABNORMAL HIGH (ref 26.0–34.0)
MCHC: 32.7 g/dL (ref 30.0–36.0)
MCV: 106.4 fL — ABNORMAL HIGH (ref 80.0–100.0)
Platelets: 221 10*3/uL (ref 150–400)
RBC: 3.62 MIL/uL — ABNORMAL LOW (ref 3.87–5.11)
RDW: 14.7 % (ref 11.5–15.5)
WBC: 4.1 10*3/uL (ref 4.0–10.5)
nRBC: 0 % (ref 0.0–0.2)

## 2020-01-13 LAB — GLUCOSE, CAPILLARY: Glucose-Capillary: 115 mg/dL — ABNORMAL HIGH (ref 70–99)

## 2020-01-13 LAB — MAGNESIUM: Magnesium: 1.6 mg/dL — ABNORMAL LOW (ref 1.7–2.4)

## 2020-01-13 MED ORDER — SODIUM CHLORIDE 0.9% FLUSH
10.0000 mL | INTRAVENOUS | Status: DC | PRN
  Administered 2020-01-13 – 2020-01-14 (×2): 10 mL

## 2020-01-13 MED ORDER — CHLORHEXIDINE GLUCONATE CLOTH 2 % EX PADS
6.0000 | MEDICATED_PAD | Freq: Every day | CUTANEOUS | Status: DC
  Administered 2020-01-13 – 2020-01-22 (×9): 6 via TOPICAL

## 2020-01-13 MED ORDER — TRAVASOL 10 % IV SOLN
INTRAVENOUS | Status: AC
  Filled 2020-01-13: qty 518.4

## 2020-01-13 MED ORDER — SODIUM CHLORIDE 0.9% FLUSH: 9.0000 mL | INTRAVENOUS | Status: DC | PRN

## 2020-01-13 MED ORDER — ONDANSETRON HCL 4 MG/2ML IJ SOLN: 4.0000 mg | Freq: Four times a day (QID) | INTRAMUSCULAR | Status: DC | PRN

## 2020-01-13 MED ORDER — DIPHENHYDRAMINE HCL 50 MG/ML IJ SOLN: 12.5000 mg | Freq: Four times a day (QID) | INTRAMUSCULAR | Status: DC | PRN

## 2020-01-13 MED ORDER — DEXTROSE IN LACTATED RINGERS 5 % IV SOLN: INTRAVENOUS | Status: AC

## 2020-01-13 MED ORDER — HYDROMORPHONE 1 MG/ML IV SOLN
INTRAVENOUS | Status: DC
  Administered 2020-01-13: 30 mg via INTRAVENOUS
  Administered 2020-01-13: 0.9 mg via INTRAVENOUS
  Administered 2020-01-14: 0.3 mg via INTRAVENOUS
  Administered 2020-01-14: 0.9 mg via INTRAVENOUS
  Administered 2020-01-14: 0.3 mg via INTRAVENOUS
  Administered 2020-01-14: 1.8 mg via INTRAVENOUS
  Administered 2020-01-14: 1.5 mg via INTRAVENOUS
  Filled 2020-01-13: qty 30

## 2020-01-13 MED ORDER — DIPHENHYDRAMINE HCL 12.5 MG/5ML PO ELIX: 12.5000 mg | ORAL_SOLUTION | Freq: Four times a day (QID) | ORAL | Status: DC | PRN

## 2020-01-13 MED ORDER — MAGNESIUM SULFATE 2 GM/50ML IV SOLN
2.0000 g | Freq: Once | INTRAVENOUS | Status: AC
  Administered 2020-01-13: 2 g via INTRAVENOUS
  Filled 2020-01-13: qty 50

## 2020-01-13 MED ORDER — NALOXONE HCL 0.4 MG/ML IJ SOLN: 0.4000 mg | INTRAMUSCULAR | Status: DC | PRN

## 2020-01-13 MED ORDER — INSULIN ASPART 100 UNIT/ML ~~LOC~~ SOLN
0.0000 [IU] | Freq: Four times a day (QID) | SUBCUTANEOUS | Status: DC
  Administered 2020-01-14 (×2): 1 [IU] via SUBCUTANEOUS
  Administered 2020-01-14 – 2020-01-15 (×2): 2 [IU] via SUBCUTANEOUS
  Administered 2020-01-15 – 2020-01-16 (×3): 1 [IU] via SUBCUTANEOUS
  Administered 2020-01-16: 2 [IU] via SUBCUTANEOUS
  Administered 2020-01-16: 1 [IU] via SUBCUTANEOUS
  Administered 2020-01-16: 2 [IU] via SUBCUTANEOUS
  Administered 2020-01-17 (×2): 1 [IU] via SUBCUTANEOUS

## 2020-01-13 NOTE — Progress Notes (Addendum)
1 Day Post-Op   Subjective/Chief Complaint: POOR PAIN CONTROL Pt with poor pain control    Objective: Vital signs in last 24 hours: Temp:  [97.5 F (36.4 C)-99.3 F (37.4 C)] 98.6 F (37 C) (03/19 0518) Pulse Rate:  [70-125] 114 (03/19 0518) Resp:  [7-22] 17 (03/19 0518) BP: (109-173)/(52-91) 122/52 (03/19 0518) SpO2:  [99 %-100 %] 100 % (03/19 0518) Last BM Date: 01/02/20  Intake/Output from previous day: 03/18 0701 - 03/19 0700 In: 3869.1 [I.V.:3336.1; IV Piggyback:533] Out: 1700 [Urine:700; Emesis/NG output:800; Blood:200] Intake/Output this shift: No intake/output data recorded.  Incision/Wound:open and clean slight distention tender around the incision   Lab Results:  Recent Labs    01/12/20 1925 01/13/20 0057  WBC 2.2* 4.1  HGB 12.5 12.6  HCT 38.2 38.5  PLT 274 221   BMET Recent Labs    01/12/20 0626 01/13/20 0057  NA 144 143  K 3.9 3.9  CL 112* 111  CO2 26 24  GLUCOSE 118* 140*  BUN 12 15  CREATININE 0.78 1.32*  CALCIUM 9.1 8.5*   PT/INR Recent Labs    01/12/20 0626  LABPROT 13.3  INR 1.0   ABG No results for input(s): PHART, HCO3 in the last 72 hours.  Invalid input(s): PCO2, PO2  Studies/Results: DG Chest 2 View  Result Date: 01/11/2020 CLINICAL DATA:  Abdominal pain and vomiting EXAM: CHEST - 2 VIEW COMPARISON:  01/17/2018 FINDINGS: Normal heart size and mild aortic tortuosity. There is aortic atherosclerosis. There is no edema, consolidation, effusion, or pneumothorax. IMPRESSION: No evidence of acute disease. Electronically Signed   By: Monte Fantasia M.D.   On: 01/11/2020 10:04   DG Abdomen 1 View  Result Date: 01/11/2020 CLINICAL DATA:  NG tube placement. EXAM: ABDOMEN - 1 VIEW COMPARISON:  01/02/2020 FINDINGS: There is a nasogastric tube with tip and side port below the level of the GE junction in the expected location of the proximal stomach. Cholecystectomy clips are identified in the right upper quadrant of the abdomen. Gaseous  distension of left upper quadrant bowel loops unchanged. IMPRESSION: NG tube tip is in the expected location of the proximal stomach. Electronically Signed   By: Kerby Moors M.D.   On: 01/11/2020 10:49   DG Abd Portable 1V  Result Date: 01/12/2020 CLINICAL DATA:  Small bowel obstruction EXAM: PORTABLE ABDOMEN - 1 VIEW COMPARISON:  January 11, 2020 FINDINGS: Nasogastric tube tip and side port are in the stomach. Currently there is no appreciable bowel dilatation or air-fluid levels. No free air. There are surgical clips in the right upper quadrant. Visualized lung bases are clear. IMPRESSION: Nasogastric tube tip and side port are in the stomach. No appreciable bowel dilatation or or air-fluid level currently. No free air. Appearance consistent with resolving bowel obstruction. Lung bases clear. Electronically Signed   By: Lowella Grip III M.D.   On: 01/12/2020 08:44   [place] EKG SITE RITE  Result Date: 01/13/2020 If Site Rite image not attached, placement could not be confirmed due to current cardiac rhythm.   Anti-infectives: Anti-infectives (From admission, onward)   Start     Dose/Rate Route Frequency Ordered Stop   01/13/20 0600  cefoTEtan (CEFOTAN) 2 g in sodium chloride 0.9 % 100 mL IVPB  Status:  Discontinued     2 g 200 mL/hr over 30 Minutes Intravenous On call to O.R. 01/12/20 1749 01/12/20 1903   01/12/20 0600  cefoTEtan (CEFOTAN) 2 g in sodium chloride 0.9 % 100 mL IVPB  2 g 200 mL/hr over 30 Minutes Intravenous On call to O.R. 01/11/20 1022 01/12/20 1213      Assessment/Plan: s/p Procedure(s): EXPLORATORY LAPAROTOMY LYSIS OF ADHESIONS AND SMALL BOWEL OBSTRUCTION (N/A) Patient Active Problem List   Diagnosis Date Noted  . Murmur, heart 01/11/2020  . SBO (small bowel obstruction) (HCC) 01/11/2020  . Small bowel obstruction (HCC) 12/29/2019  . Pressure injury of skin 10/29/2019  . Colitis 10/28/2019  . AKI (acute kidney injury) (HCC) 10/27/2019  . Syncope 10/27/2019   . Hypokalemia 10/27/2019  . Essential hypertension 10/27/2019  . Malnutrition of moderate degree 09/07/2019  . Cholecystitis 09/04/2019  . Altered mental status 01/18/2018  . Altered mental state 01/17/2018  PICC TNA for prolonged ileus  Add PCA for better pain control DVT prevention PAS/SQ heparin  LOS: 2 days    Samantha Gonzales 01/13/2020

## 2020-01-13 NOTE — Progress Notes (Signed)
Patient has had 200 ml of output during shift per foley catheter. Bladder scan performed and result showed only 9 ml of retained urine. MD on call notified and no new orders given. MD is aware and ok with this amount due to size and age of patient, does not want to increase fluids at this time. Marcelle Overlie, RN

## 2020-01-13 NOTE — Progress Notes (Signed)
Consult PROGRESS NOTE  Samantha Gonzales OZD:[id number] DOB: 03-27-1941 DOA: 01/11/2020 PCP: System, Pcp Not In   Requesting physician: Charleston Ropes, MD Reason for consultation: Medical clearance for surgery, cardiac murmur           Impression/Recommendations    Small bowel obstruction s/p exlap lysis of adhesions 3/18 Dr. Luisa Hart   Murmur, heart   Possible hypertrophic cardiomyopathy 2D echo 3/5.   Essential HTN   HLD   AKI   Hypomagnesemia    Continue IV fluid hydration Continue electrolytes replacement; goal K+ >4.0, mag>2.0 Repeating BMP and mag tomorrow   Thank you for this consultation.  Our [facility] hospitalist team will follow the patient with you.   HPI/Recap of past 24 hours: Samantha Gonzales is an 79 y.o. female with a h/o HTN, HLD and mild Mitral regurgitation who presents to the hospital with abdominal pain and vomiting.  Recent history:  Lap Cholecystectomy for acute cholecystitis in 11/20  ERCP sphincterotomy with stenting of the ventral pancreatic duct on 09/07/19  Admitted from 12/31 - 10/31/19 for a colitis   Subsequently was admitted on 12/29/19 to the [facility] practice teaching service for SBO and discharged on 3/9.  Her CT again is showing a SBO and we are asked for medical clearance due to her h/o a murmur. She developed abdominal pain, distension and mild vomiting a couple of days ago. No hematemesis. She can't recall her last BM. NG tube in place to suction with moderate ouput.  01/12/20: Somnolent and minimally interactive.      Assessment/Plan: Principal Problem:   Small bowel obstruction (HCC) Active Problems:   Essential hypertension   Murmur, heart   SBO (small bowel obstruction) (HCC)     Objective: Vitals:   01/13/20 1123 01/13/20 1127 01/13/20 1132 01/13/20 1314  BP:    (!) 129/59  Pulse:    (!) 108  Resp: 16 16    Temp:    98.5 F (36.9 C)  TempSrc:    Oral  SpO2: 100% 100%  100%  Weight:   55.7 kg   Height:         Intake/Output Summary (Last 24 hours) at 01/13/2020 1444 Last data filed at 01/13/2020 1317 Gross per 24 hour  Intake 2619.14 ml  Output 950 ml  Net 1669.14 ml   Filed Weights   01/11/20 0317 01/13/20 1132  Weight: 72.6 kg 55.7 kg    Exam:  . General: 79 y.o. year-old female well developed, well nourished no acute distress. Somnolent. . Cardiovascular: Regular rate and rythm  . Respiratory: clear to auscultation . Abdomen: surgical dressing in place, Moderate amount of output from NG tube . Musculoskeletal: No LE edema   Data Reviewed: CBC: Recent Labs  Lab 01/11/20 0615 01/12/20 0626 01/12/20 1925 01/13/20 0057  WBC 11.7* 6.3 2.2* 4.1  NEUTROABS 10.7*  --   --   --   HGB 13.7 10.7* 12.5 12.6  HCT 40.7 32.0* 38.2 38.5  MCV 103.8* 104.2* 106.7* 106.4*  PLT 438* 326 274 221   Basic Metabolic Panel: Recent Labs  Lab 01/11/20 0728 01/11/20 1213 01/12/20 0626 01/13/20 0057 01/13/20 0939  NA 139  --  144 143  --   K 3.5  --  3.9 3.9  --   CL 105  --  112* 111  --   CO2 23  --  26 24  --   GLUCOSE 128*  --  118* 140*  --   BUN 10  --  12 15  --   CREATININE 0.84  --  0.78 1.32*  --   CALCIUM 9.3  --  9.1 8.5*  --   MG  --  2.0 1.9  --  1.6*  PHOS  --   --   --   --  3.9   GFR: Estimated Creatinine Clearance: 31.9 mL/min (A) (by C-G formula based on SCr of 1.32 mg/dL (H)). Liver Function Tests: Recent Labs  Lab 01/11/20 0728 01/13/20 0057  AST 21 14*  ALT 12 10  ALKPHOS 81 42  BILITOT 0.5 0.3  PROT 6.5 4.6*  ALBUMIN 3.5 2.6*   Recent Labs  Lab 01/11/20 0728  LIPASE 21   No results for input(s): AMMONIA in the last 168 hours. Coagulation Profile: Recent Labs  Lab 01/12/20 0626  INR 1.0   Cardiac Enzymes: No results for input(s): CKTOTAL, CKMB, CKMBINDEX, TROPONINI in the last 168 hours. BNP (last 3 results) No results for input(s): PROBNP in the last 8760 hours. HbA1C: No results for input(s): HGBA1C in the last 72 hours. CBG: No  results for input(s): GLUCAP in the last 168 hours. Lipid Profile: No results for input(s): CHOL, HDL, LDLCALC, TRIG, CHOLHDL, LDLDIRECT in the last 72 hours. Thyroid Function Tests: No results for input(s): TSH, T4TOTAL, FREET4, T3FREE, THYROIDAB in the last 72 hours. Anemia Panel: Recent Labs    01/11/20 1213  VITAMINB12 214  FOLATE 4.8*  FERRITIN 148  TIBC 310  IRON 26*  RETICCTPCT 0.8   Urine analysis:    Component Value Date/Time   COLORURINE YELLOW 01/11/2020 0746   APPEARANCEUR CLEAR 01/11/2020 0746   LABSPEC 1.021 01/11/2020 0746   PHURINE 5.0 01/11/2020 0746   GLUCOSEU NEGATIVE 01/11/2020 0746   HGBUR SMALL (A) 01/11/2020 0746   BILIRUBINUR NEGATIVE 01/11/2020 0746   KETONESUR 5 (A) 01/11/2020 0746   PROTEINUR 100 (A) 01/11/2020 0746   NITRITE NEGATIVE 01/11/2020 0746   LEUKOCYTESUR SMALL (A) 01/11/2020 0746   Sepsis Labs: @LABRCNTIP (procalcitonin:4,lacticidven:4)  ) Recent Results (from the past 240 hour(s))  Respiratory Panel by RT PCR (Flu A&B, Covid) - Nasopharyngeal Swab     Status: None   Collection Time: 01/11/20  7:28 AM   Specimen: Nasopharyngeal Swab  Result Value Ref Range Status   SARS Coronavirus 2 by RT PCR NEGATIVE NEGATIVE Final    Comment: (NOTE) SARS-CoV-2 target nucleic acids are NOT DETECTED. The SARS-CoV-2 RNA is generally detectable in upper respiratoy specimens during the acute phase of infection. The lowest concentration of SARS-CoV-2 viral copies this assay can detect is 131 copies/mL. A negative result does not preclude SARS-Cov-2 infection and should not be used as the sole basis for treatment or other patient management decisions. A negative result may occur with  improper specimen collection/handling, submission of specimen other than nasopharyngeal swab, presence of viral mutation(s) within the areas targeted by this assay, and inadequate number of viral copies (<131 copies/mL). A negative result must be combined with  clinical observations, patient history, and epidemiological information. The expected result is Negative. Fact Sheet for Patients:  01/13/20 Fact Sheet for Healthcare Providers:  https://www.moore.com/ This test is not yet ap proved or cleared by the https://www.young.biz/ FDA and  has been authorized for detection and/or diagnosis of SARS-CoV-2 by FDA under an Emergency Use Authorization (EUA). This EUA will remain  in effect (meaning this test can be used) for the duration of the COVID-19 declaration under Section 564(b)(1) of the Act, 21 U.S.C. section 360bbb-3(b)(1), unless the authorization is  terminated or revoked sooner.    Influenza A by PCR NEGATIVE NEGATIVE Final   Influenza B by PCR NEGATIVE NEGATIVE Final    Comment: (NOTE) The Xpert Xpress SARS-CoV-2/FLU/RSV assay is intended as an aid in  the diagnosis of influenza from Nasopharyngeal swab specimens and  should not be used as a sole basis for treatment. Nasal washings and  aspirates are unacceptable for Xpert Xpress SARS-CoV-2/FLU/RSV  testing. Fact Sheet for Patients: PinkCheek.be Fact Sheet for Healthcare Providers: GravelBags.it This test is not yet approved or cleared by the [place] FDA and  has been authorized for detection and/or diagnosis of SARS-CoV-2 by  FDA under an Emergency Use Authorization (EUA). This EUA will remain  in effect (meaning this test can be used) for the duration of the  Covid-19 declaration under Section 564(b)(1) of the Act, 21  U.S.C. section 360bbb-3(b)(1), unless the authorization is  terminated or revoked. Performed at [facility], [place] [address] [place], [place] [address]   Surgical pcr screen     Status: None   Collection Time: 01/12/20 11:20 AM   Specimen: Nasal Mucosa; Nasal Swab  Result Value Ref Range Status   MRSA, PCR NEGATIVE NEGATIVE Final    Staphylococcus aureus NEGATIVE NEGATIVE Final    Comment: (NOTE) The Xpert SA Assay (FDA approved for NASAL specimens in patients 20 years of age and older), is one component of a comprehensive surveillance program. It is not intended to diagnose infection nor to guide or monitor treatment. Performed at [facility], [place] [address] [place], [place] [address]       Studies: [place] EKG SITE RITE  Result Date: 01/13/2020 If Site Rite image not attached, placement could not be confirmed due to current cardiac rhythm.   Scheduled Meds: . Chlorhexidine Gluconate Cloth  6 each Topical Daily  . heparin  5,000 Units Subcutaneous Q8H  . HYDROmorphone   Intravenous Q4H  . insulin aspart  0-9 Units Subcutaneous Q6H    Continuous Infusions: . dextrose 5% lactated ringers    . dextrose 5 % lactated ringers with kcl 100 mL/hr at 01/12/20 0011  . TPN ADULT (ION)       LOS: 2 days     Kayleen Memos, MD [facility] Pager [phone]  If 7PM-7AM, please contact night-coverage [email or web address] Password [facility] 01/13/2020, 2:44 PM

## 2020-01-13 NOTE — Care Management Important Message (Signed)
Important Message  Patient Details IM Letter given to [name] SW Case Manager to present to the Patient Name: Samantha Gonzales MRN: [id number] Date of Birth: [birth year]   Medicare Important Message Given:  Yes     Caren Macadam 01/13/2020, 12:20 PM

## 2020-01-13 NOTE — Progress Notes (Signed)
Patient calling out asking staff to push her PCA for her. Writer educated that she is the only one that can push her PCA for herself

## 2020-01-13 NOTE — Progress Notes (Signed)
Peripherally Inserted Central Catheter Placement  The IV Nurse has discussed with the patient and/or persons authorized to consent for the patient, the purpose of this procedure and the potential benefits and risks involved with this procedure.  The benefits include less needle sticks, lab draws from the catheter, and the patient may be discharged home with the catheter. Risks include, but not limited to, infection, bleeding, blood clot (thrombus formation), and puncture of an artery; nerve damage and irregular heartbeat and possibility to perform a PICC exchange if needed/ordered by physician.  Alternatives to this procedure were also discussed.  Bard Power PICC patient education guide, fact sheet on infection prevention and patient information card has been provided to patient /or left at bedside.    PICC Placement Documentation  PICC Double Lumen 01/13/20 PICC Right Brachial 38 cm 0 cm (Active)       Samantha Gonzales 01/13/2020, 3:45 PM

## 2020-01-13 NOTE — Progress Notes (Signed)
NUTRITION NOTE  Full RD assessment done yesterday; note at 1124. Consult received this AM for new TPN. Pharmacist to initiate and advance TPN rate. Patient currently only has peripheral IV access.   Monitor magnesium, potassium, and phosphorus daily for at least 3 days, MD to replete as needed, as pt is at risk for refeeding syndrome given poor nutrition intake for several months.   Estimated Nutritional Needs:  Kcal:  1460-4799 kcal Protein:  108-120 grams Fluid:  >/= 2.2 L/day     Trenton Gammon, MS, RD, LDN, CNSC Inpatient Clinical Dietitian RD pager # available in AMION  After hours/weekend pager # available in [facility]

## 2020-01-13 NOTE — Progress Notes (Signed)
PHARMACY - TOTAL PARENTERAL NUTRITION CONSULT NOTE   Indication: Prolonged ileus  Patient Measurements: Height: 6\' 1"  (185.4 cm) Weight: 160 lb (72.6 kg) IBW/kg (Calculated) : 75.4 TPN AdjBW (KG): 72.6 Body mass index is 21.11 kg/m. Usual Weight: ~81 kg  Assessment: Pt admitted with abdominal pain/vomiting. Pt has extensive GI surgical history: lap chole 09/05/19; ERCP, sphincterotomy, stent placement with removal of sludge on 09/07/19 and developed gallbladder fossa abscess and had CT guided transgluteal drain placement. Recently admitted from 3/4 - 3/9 with SBO, improved on small bowel protocol and NGT placement prior to discharge. Pt presented back to ED on 3/18 with complaints of abdominal pain: CT shows bowel obstruction. Pt has had recent weight loss of 19 pounds since 09/06/20. Pharmacy consulted to initiate TPN.   Glucose / Insulin: No hx DM. BG <150. Goal 100-150. Electrolytes: Mg 1.6 slightly low; all others WNL Renal: SCr 1.32 - elevated from baseline. BUN WNL LFTs / TGs: LFTs WNL Prealbumin / albumin: WNL (18.8) / 2.6 (low) Intake / Output; MIVF: D5LR 30 mEq KCl @ 100 mL/hr GI Imaging: 3/17: CT Abd: SBO, possible related to adhesion. Per surgery, marked improvement Surgeries / Procedures:  3/18: Ex lap with small bowel resection and lysis of adhesions  Central access: PICC ordered 3/19, anticipate placement this afternoon TPN start date: 3/19  Nutritional Goals (per RD recommendation on 3/19):  kCal: 2175-2395, Protein: 108-120, Fluid: >/= 2.2 L/day  Goal TPN rate is 90 mL/hr (provides 117 g of protein and 2263 kcals per day)  Current Nutrition: NPO, TPN  Plan:  Now: Magnesium sulfate 2 g IV once  At 1800:  Start TPN at 40 mL/hr at 1800 (half-rate)  Electrolytes in TPN: Standard concentrations; 46mEq/L of Na, 65mEq/L of K, 50mEq/L of Ca, 66mEq/L of Mg, and 109mmol/L of Phos. Cl:Ac ratio 1:1  Add trace elements to TPN; MVI on MWF only due to national  backorder  Initiate Sensitive q6h SSI and adjust as needed   Reduce MIVF to 60 mL/hr at 1800; remove K from IVF  Monitor TPN labs on Mon/Thurs, check electrolytes tomorrow as pt at risk for refeeding syndrome  12m, PharmD 01/13/2020,10:40 AM

## 2020-01-14 ENCOUNTER — Inpatient Hospital Stay (HOSPITAL_COMMUNITY): Payer: Medicare PPO

## 2020-01-14 LAB — COMPREHENSIVE METABOLIC PANEL
ALT: 10 U/L (ref 0–44)
AST: 14 U/L — ABNORMAL LOW (ref 15–41)
Albumin: 2.2 g/dL — ABNORMAL LOW (ref 3.5–5.0)
Alkaline Phosphatase: 49 U/L (ref 38–126)
Anion gap: 7 (ref 5–15)
BUN: 24 mg/dL — ABNORMAL HIGH (ref 8–23)
CO2: 28 mmol/L (ref 22–32)
Calcium: 8.5 mg/dL — ABNORMAL LOW (ref 8.9–10.3)
Chloride: 110 mmol/L (ref 98–111)
Creatinine, Ser: 0.97 mg/dL (ref 0.44–1.00)
GFR calc Af Amer: >60 mL/min (ref >60)
GFR calc non Af Amer: 57 mL/min — ABNORMAL LOW (ref >60)
Glucose, Bld: 148 mg/dL — ABNORMAL HIGH (ref 70–99)
Potassium: 4.3 mmol/L (ref 3.5–5.1)
Sodium: 145 mmol/L (ref 135–145)
Total Bilirubin: 0.5 mg/dL (ref 0.3–1.2)
Total Protein: 4.7 g/dL — ABNORMAL LOW (ref 6.5–8.1)

## 2020-01-14 LAB — DIFFERENTIAL
Abs Immature Granulocytes: 0.3 10*3/uL — ABNORMAL HIGH (ref 0.00–0.07)
Basophils Absolute: 0 10*3/uL (ref 0.0–0.1)
Basophils Relative: 0 %
Eosinophils Absolute: 0 10*3/uL (ref 0.0–0.5)
Eosinophils Relative: 0 %
Immature Granulocytes: 3 %
Lymphocytes Relative: 3 %
Lymphs Abs: 0.4 10*3/uL — ABNORMAL LOW (ref 0.7–4.0)
Monocytes Absolute: 0.1 10*3/uL (ref 0.1–1.0)
Monocytes Relative: 1 %
Neutro Abs: 11.2 10*3/uL — ABNORMAL HIGH (ref 1.7–7.7)
Neutrophils Relative %: 93 %

## 2020-01-14 LAB — GLUCOSE, CAPILLARY
Glucose-Capillary: 110 mg/dL — ABNORMAL HIGH (ref 70–99)
Glucose-Capillary: 140 mg/dL — ABNORMAL HIGH (ref 70–99)
Glucose-Capillary: 142 mg/dL — ABNORMAL HIGH (ref 70–99)
Glucose-Capillary: 150 mg/dL — ABNORMAL HIGH (ref 70–99)
Glucose-Capillary: 179 mg/dL — ABNORMAL HIGH (ref 70–99)

## 2020-01-14 LAB — CBC
HCT: 30.6 % — ABNORMAL LOW (ref 36.0–46.0)
Hemoglobin: 10 g/dL — ABNORMAL LOW (ref 12.0–15.0)
MCH: 34.7 pg — ABNORMAL HIGH (ref 26.0–34.0)
MCHC: 32.7 g/dL (ref 30.0–36.0)
MCV: 106.3 fL — ABNORMAL HIGH (ref 80.0–100.0)
Platelets: 144 10*3/uL — ABNORMAL LOW (ref 150–400)
RBC: 2.88 MIL/uL — ABNORMAL LOW (ref 3.87–5.11)
RDW: 14.7 % (ref 11.5–15.5)
WBC: 11.9 10*3/uL — ABNORMAL HIGH (ref 4.0–10.5)
nRBC: 0 % (ref 0.0–0.2)

## 2020-01-14 LAB — MAGNESIUM: Magnesium: 2.1 mg/dL (ref 1.7–2.4)

## 2020-01-14 LAB — LACTIC ACID, PLASMA: Lactic Acid, Venous: 0.9 mmol/L (ref 0.5–1.9)

## 2020-01-14 LAB — PREALBUMIN: Prealbumin: 11.6 mg/dL — ABNORMAL LOW (ref 18–38)

## 2020-01-14 LAB — PROCALCITONIN: Procalcitonin: 43.36 ng/mL

## 2020-01-14 LAB — PHOSPHORUS: Phosphorus: 2.2 mg/dL — ABNORMAL LOW (ref 2.5–4.6)

## 2020-01-14 LAB — TRIGLYCERIDES: Triglycerides: 32 mg/dL (ref <150)

## 2020-01-14 MED ORDER — KETOROLAC TROMETHAMINE 15 MG/ML IJ SOLN
15.0000 mg | Freq: Four times a day (QID) | INTRAMUSCULAR | Status: AC
  Administered 2020-01-14 – 2020-01-19 (×20): 15 mg via INTRAVENOUS
  Filled 2020-01-14 (×19): qty 1

## 2020-01-14 MED ORDER — CIPROFLOXACIN IN D5W 400 MG/200ML IV SOLN
400.0000 mg | Freq: Two times a day (BID) | INTRAVENOUS | Status: DC
  Administered 2020-01-14 – 2020-01-17 (×6): 400 mg via INTRAVENOUS
  Filled 2020-01-14 (×6): qty 200

## 2020-01-14 MED ORDER — ACETAMINOPHEN 10 MG/ML IV SOLN
1000.0000 mg | Freq: Four times a day (QID) | INTRAVENOUS | Status: AC
  Administered 2020-01-14 – 2020-01-15 (×4): 1000 mg via INTRAVENOUS
  Filled 2020-01-14 (×4): qty 100

## 2020-01-14 MED ORDER — TRAVASOL 10 % IV SOLN
INTRAVENOUS | Status: AC
  Filled 2020-01-14: qty 907.2

## 2020-01-14 MED ORDER — POTASSIUM PHOSPHATES 15 MMOLE/5ML IV SOLN
10.0000 mmol | Freq: Once | INTRAVENOUS | Status: AC
  Administered 2020-01-14: 10 mmol via INTRAVENOUS
  Filled 2020-01-14: qty 3.33

## 2020-01-14 MED ORDER — DEXTROSE IN LACTATED RINGERS 5 % IV SOLN: INTRAVENOUS | Status: AC

## 2020-01-14 MED ORDER — METRONIDAZOLE IN NACL 5-0.79 MG/ML-% IV SOLN
500.0000 mg | Freq: Three times a day (TID) | INTRAVENOUS | Status: DC
  Administered 2020-01-14 – 2020-01-17 (×9): 500 mg via INTRAVENOUS
  Filled 2020-01-14 (×9): qty 100

## 2020-01-14 MED ORDER — HYDROMORPHONE HCL 1 MG/ML IJ SOLN
1.0000 mg | INTRAMUSCULAR | Status: DC | PRN
  Administered 2020-01-14 – 2020-01-17 (×20): 1 mg via INTRAVENOUS
  Filled 2020-01-14 (×20): qty 1

## 2020-01-14 NOTE — Progress Notes (Signed)
2 Days Post-Op   Subjective/Chief Complaint:  Pt with poor pain control    Objective: Vital signs in last 24 hours: Temp:  [97.1 F (36.2 C)-99.4 F (37.4 C)] 97.1 F (36.2 C) (03/20 0447) Pulse Rate:  [103-114] 108 (03/20 0447) Resp:  [12-23] 16 (03/20 0447) BP: (129-177)/(50-63) 139/56 (03/20 0447) SpO2:  [99 %-100 %] 100 % (03/20 0447) Weight:  [55.7 kg] 55.7 kg (03/19 1132) Last BM Date: 01/02/20  Intake/Output from previous day: 03/19 0701 - 03/20 0700 In: 1587.5 [I.V.:1337.5; IV Piggyback:250] Out: 700 [Urine:650; Emesis/NG output:50] Intake/Output this shift: No intake/output data recorded. abd distended, appropriately tender Incision/Wound:open and clean slight distention tender around the incision   Lab Results:  Recent Labs    01/13/20 0057 01/14/20 0341  WBC 4.1 11.9*  HGB 12.6 10.0*  HCT 38.5 30.6*  PLT 221 144*   BMET Recent Labs    01/13/20 0057 01/14/20 0341  NA 143 145  K 3.9 4.3  CL 111 110  CO2 24 28  GLUCOSE 140* 148*  BUN 15 24*  CREATININE 1.32* 0.97  CALCIUM 8.5* 8.5*   PT/INR Recent Labs    01/12/20 0626  LABPROT 13.3  INR 1.0   ABG No results for input(s): PHART, HCO3 in the last 72 hours.  Invalid input(s): PCO2, PO2  Studies/Results: [place] EKG SITE RITE  Result Date: 01/13/2020 If Site Rite image not attached, placement could not be confirmed due to current cardiac rhythm.   Anti-infectives: Anti-infectives (From admission, onward)   Start     Dose/Rate Route Frequency Ordered Stop   01/13/20 0600  cefoTEtan (CEFOTAN) 2 g in sodium chloride 0.9 % 100 mL IVPB  Status:  Discontinued     2 g 200 mL/hr over 30 Minutes Intravenous On call to O.R. 01/12/20 1749 01/12/20 1903   01/12/20 0600  cefoTEtan (CEFOTAN) 2 g in sodium chloride 0.9 % 100 mL IVPB     2 g 200 mL/hr over 30 Minutes Intravenous On call to O.R. 01/11/20 1022 01/12/20 1213      Assessment/Plan: s/p Procedure(s): EXPLORATORY LAPAROTOMY LYSIS OF  ADHESIONS AND SMALL BOWEL OBSTRUCTION (N/A) Patient Active Problem List   Diagnosis Date Noted  . Murmur, heart 01/11/2020  . SBO (small bowel obstruction) (HCC) 01/11/2020  . Small bowel obstruction (HCC) 12/29/2019  . Pressure injury of skin 10/29/2019  . Colitis 10/28/2019  . AKI (acute kidney injury) (HCC) 10/27/2019  . Syncope 10/27/2019  . Hypokalemia 10/27/2019  . Essential hypertension 10/27/2019  . Malnutrition of moderate degree 09/07/2019  . Cholecystitis 09/04/2019  . Altered mental status 01/18/2018  . Altered mental state 01/17/2018  PICC TNA for prolonged ileus, replace Mg and Phos in TPN Cont PCA for better pain control DVT prevention PAS/SQ heparin D/c foley  LOS: 3 days    Vanita Panda 01/14/2020

## 2020-01-14 NOTE — Progress Notes (Signed)
Dr. Corliss Skains aware pt in constant pain with no relief. See new orders received.

## 2020-01-14 NOTE — Progress Notes (Addendum)
Consult PROGRESS NOTE  JESELLE Gonzales RXV:[id number] DOB: Nov 26, 1940 DOA: 01/11/2020 PCP: System, Pcp Not In   Requesting physician: Samantha Ropes, MD Reason for consultation: Medical clearance for surgery, cardiac murmur           Impression   -Small bowel obstruction s/p exlap lysis of adhesions 3/18 Dr. Luisa Hart   -Murmur, heart   -Possible hypertrophic cardiomyopathy 2D echo 3/5.   -Essential HTN   -HLD   -AKI   -Hypomagnesemia   -Hypophosphatemia   -Leukocytosis, neutrophilia, elevated procalcitonin   -Tachycardia   -Cognitive impairment per her daughter   Recommendations: -Contacted Cardiology Dr. Diona Browner to see if they would review her 2D echo done on 3/5 to assess possible hypertrophic CM.  They will review and see her tomorrow. -Obtained blood cx x2, CXR to further assess leukocytosis, neutrophilia and elevated procalcitonin up to 43.  Last UA wbc 11-20, will get U CX. -Started IV Cipro and IV Flagyl until an active infective process can be ruled out. Allergic to Penicillin. -Phosphorous replaced -Obtain 12 lead EKG for tachycardia -Continue IV fluid hydration -Continue electrolytes replacement; goal K+ >4.0, mag>2.0 -Repeating labs tomorrow -Follow cultures -Reorient as needed -Will add continuous pulse oximetry while on dilaudid PCA -May consider transfer to higher level of care-Defer to primary.  Thank you for this consultation.  Our [facility] hospitalist team will follow the patient with you.   HPI/Recap of past 24 hours: Samantha Gonzales is an 79 y.o. female with a h/o HTN, HLD and mild Mitral regurgitation who presents to the hospital with abdominal pain and vomiting.   Lap Cholecystectomy for acute cholecystitis in 11/20  ERCP sphincterotomy with stenting of the ventral pancreatic duct on 09/07/19  Admitted from 12/31 - 10/31/19 for colitis   Admitted on 12/29/19 to the [facility] practice teaching service for SBO and  discharged on 01/03/20.  Her CT again is  showing a SBO and we are asked for medical clearance due to her h/o a murmur.   01/14/20:  Seen and examined.  Somnolent and minimally interactive.  States her abdomen hurts.  PCA in place for pain control.  Per bedside RN does not always remember to push the button.  Called her daughter. She states she noted decline in her mother's cognitive ability but has not been diagnosed with dementia.  She was somewhat active before she became ill, cooked, took care of herself, did not drive.   Assessment/Plan: Principal Problem:   Small bowel obstruction (HCC) Active Problems:   Essential hypertension   Murmur, heart   SBO (small bowel obstruction) (HCC)     Objective: Vitals:   01/14/20 0412 01/14/20 0447 01/14/20 0842 01/14/20 1012  BP:  (!) 139/56  (!) 141/67  Pulse:  (!) 108  (!) 110  Resp: 18 16 18 15   Temp:  (!) 97.1 F (36.2 C)  99.6 F (37.6 C)  TempSrc:  Axillary  Oral  SpO2: 100% 100% 99% 100%  Weight:      Height:        Intake/Output Summary (Last 24 hours) at 01/14/2020 1130 Last data filed at 01/14/2020 1013 Gross per 24 hour  Intake 1829.5 ml  Output 850 ml  Net 979.5 ml   Filed Weights   01/11/20 0317 01/13/20 1132  Weight: 72.6 kg 55.7 kg    Exam:  . General: 79 y.o. year-old female Well developed somnolent and minimally interactive. NG tube in place . Cardiovascular: Tachycardic, no rubs or gallops . Respiratory: Mild  rales at base.  No wheezing.  Poor inspiratory efforts . Abdomen: surgical dressing in place, Moderate amount of output from NG tube . Musculoskeletal: No LE edema bilaterally   Data Reviewed: CBC: Recent Labs  Lab 01/11/20 0615 01/12/20 0626 01/12/20 1925 01/13/20 0057 01/14/20 0341  WBC 11.7* 6.3 2.2* 4.1 11.9*  NEUTROABS 10.7*  --   --   --  11.2*  HGB 13.7 10.7* 12.5 12.6 10.0*  HCT 40.7 32.0* 38.2 38.5 30.6*  MCV 103.8* 104.2* 106.7* 106.4* 106.3*  PLT 438* 326 274 221 916*   Basic Metabolic Panel: Recent Labs  Lab  01/11/20 0728 01/11/20 1213 01/12/20 0626 01/13/20 0057 01/13/20 0939 01/14/20 0341  NA 139  --  144 143  --  145  K 3.5  --  3.9 3.9  --  4.3  CL 105  --  112* 111  --  110  CO2 23  --  26 24  --  28  GLUCOSE 128*  --  118* 140*  --  148*  BUN 10  --  12 15  --  24*  CREATININE 0.84  --  0.78 1.32*  --  0.97  CALCIUM 9.3  --  9.1 8.5*  --  8.5*  MG  --  2.0 1.9  --  1.6* 2.1  PHOS  --   --   --   --  3.9 2.2*   GFR: Estimated Creatinine Clearance: 43.4 mL/min (by C-G formula based on SCr of 0.97 mg/dL). Liver Function Tests: Recent Labs  Lab 01/11/20 0728 01/13/20 0057 01/14/20 0341  AST 21 14* 14*  ALT 12 10 10   ALKPHOS 81 42 49  BILITOT 0.5 0.3 0.5  PROT 6.5 4.6* 4.7*  ALBUMIN 3.5 2.6* 2.2*   Recent Labs  Lab 01/11/20 0728  LIPASE 21   No results for input(s): AMMONIA in the last 168 hours. Coagulation Profile: Recent Labs  Lab 01/12/20 0626  INR 1.0   Cardiac Enzymes: No results for input(s): CKTOTAL, CKMB, CKMBINDEX, TROPONINI in the last 168 hours. BNP (last 3 results) No results for input(s): PROBNP in the last 8760 hours. HbA1C: No results for input(s): HGBA1C in the last 72 hours. CBG: Recent Labs  Lab 01/13/20 1724 01/14/20 0000 01/14/20 0609 01/14/20 1114  GLUCAP 115* 179* 140* 142*   Lipid Profile: Recent Labs    01/14/20 0341  TRIG 32   Thyroid Function Tests: No results for input(s): TSH, T4TOTAL, FREET4, T3FREE, THYROIDAB in the last 72 hours. Anemia Panel: Recent Labs    01/11/20 1213  VITAMINB12 214  FOLATE 4.8*  FERRITIN 148  TIBC 310  IRON 26*  RETICCTPCT 0.8   Urine analysis:    Component Value Date/Time   COLORURINE YELLOW 01/11/2020 0746   APPEARANCEUR CLEAR 01/11/2020 0746   LABSPEC 1.021 01/11/2020 0746   PHURINE 5.0 01/11/2020 0746   GLUCOSEU NEGATIVE 01/11/2020 0746   HGBUR SMALL (A) 01/11/2020 0746   BILIRUBINUR NEGATIVE 01/11/2020 0746   KETONESUR 5 (A) 01/11/2020 0746   PROTEINUR 100 (A) 01/11/2020  0746   NITRITE NEGATIVE 01/11/2020 0746   LEUKOCYTESUR SMALL (A) 01/11/2020 0746   Sepsis Labs: @LABRCNTIP (procalcitonin:4,lacticidven:4)  ) Recent Results (from the past 240 hour(s))  Respiratory Panel by RT PCR (Flu A&B, Covid) - Nasopharyngeal Swab     Status: None   Collection Time: 01/11/20  7:28 AM   Specimen: Nasopharyngeal Swab  Result Value Ref Range Status   SARS Coronavirus 2 by RT PCR NEGATIVE NEGATIVE Final  Comment: (NOTE) SARS-CoV-2 target nucleic acids are NOT DETECTED. The SARS-CoV-2 RNA is generally detectable in upper respiratoy specimens during the acute phase of infection. The lowest concentration of SARS-CoV-2 viral copies this assay can detect is 131 copies/mL. A negative result does not preclude SARS-Cov-2 infection and should not be used as the sole basis for treatment or other patient management decisions. A negative result may occur with  improper specimen collection/handling, submission of specimen other than nasopharyngeal swab, presence of viral mutation(s) within the areas targeted by this assay, and inadequate number of viral copies (<131 copies/mL). A negative result must be combined with clinical observations, patient history, and epidemiological information. The expected result is Negative. Fact Sheet for Patients:  https://www.moore.com/ Fact Sheet for Healthcare Providers:  https://www.young.biz/ This test is not yet ap proved or cleared by the [place] FDA and  has been authorized for detection and/or diagnosis of SARS-CoV-2 by FDA under an Emergency Use Authorization (EUA). This EUA will remain  in effect (meaning this test can be used) for the duration of the COVID-19 declaration under Section 564(b)(1) of the Act, 21 U.S.C. section 360bbb-3(b)(1), unless the authorization is terminated or revoked sooner.    Influenza A by PCR NEGATIVE NEGATIVE Final   Influenza B by PCR NEGATIVE NEGATIVE  Final    Comment: (NOTE) The Xpert Xpress SARS-CoV-2/FLU/RSV assay is intended as an aid in  the diagnosis of influenza from Nasopharyngeal swab specimens and  should not be used as a sole basis for treatment. Nasal washings and  aspirates are unacceptable for Xpert Xpress SARS-CoV-2/FLU/RSV  testing. Fact Sheet for Patients: https://www.moore.com/ Fact Sheet for Healthcare Providers: https://www.young.biz/ This test is not yet approved or cleared by the [place] FDA and  has been authorized for detection and/or diagnosis of SARS-CoV-2 by  FDA under an Emergency Use Authorization (EUA). This EUA will remain  in effect (meaning this test can be used) for the duration of the  Covid-19 declaration under Section 564(b)(1) of the Act, 21  U.S.C. section 360bbb-3(b)(1), unless the authorization is  terminated or revoked. Performed at [facility], [address], [place], [place] [address]   Surgical pcr screen     Status: None   Collection Time: 01/12/20 11:20 AM   Specimen: Nasal Mucosa; Nasal Swab  Result Value Ref Range Status   MRSA, PCR NEGATIVE NEGATIVE Final   Staphylococcus aureus NEGATIVE NEGATIVE Final    Comment: (NOTE) The Xpert SA Assay (FDA approved for NASAL specimens in patients 43 years of age and older), is one component of a comprehensive surveillance program. It is not intended to diagnose infection nor to guide or monitor treatment. Performed at [facility], [address], [place], [place] [address]       Studies: No results found.  Scheduled Meds: . Chlorhexidine Gluconate Cloth  6 each Topical Daily  . heparin  5,000 Units Subcutaneous Q8H  . HYDROmorphone   Intravenous Q4H  . insulin aspart  0-9 Units Subcutaneous Q6H    Continuous Infusions: . dextrose 5% lactated ringers 60 mL/hr at 01/14/20 1013  . dextrose 5% lactated ringers    . potassium PHOSPHATE IVPB (in  mmol) 10 mmol (01/14/20 0933)  . TPN ADULT (ION) 40 mL/hr at 01/13/20 1707  . TPN ADULT (ION)       LOS: 3 days     Darlin Drop, MD [facility] Pager [phone]  If 7PM-7AM, please contact night-coverage [email or web address] Password [email or web address] 01/14/2020, 11:30 AM

## 2020-01-14 NOTE — Progress Notes (Signed)
PHARMACY - TOTAL PARENTERAL NUTRITION CONSULT NOTE   Indication: Prolonged ileus  Patient Measurements: Height: 6\' 1"  (185.4 cm) Weight: 122 lb 12.7 oz (55.7 kg) IBW/kg (Calculated) : 75.4 TPN AdjBW (KG): 72.6 Body mass index is 16.2 kg/m. Usual Weight: ~81 kg  Assessment: Pt admitted with abdominal pain/vomiting. Pt has extensive GI surgical history: lap chole 09/05/19; ERCP, sphincterotomy, stent placement with removal of sludge on 09/07/19 and developed gallbladder fossa abscess and had CT guided transgluteal drain placement. Recently admitted from 3/4 - 3/9 with SBO, improved on small bowel protocol and NGT placement prior to discharge. Pt presented back to ED on 3/18 with complaints of abdominal pain: CT shows bowel obstruction. Pt has had recent weight loss of 19 pounds since 09/06/20. Pharmacy consulted to initiate TPN.   Glucose / Insulin: No hx DM.  - on sSSI q6h (used 3 units since TPN started yesterday at 6PM) -CBG <150 (Goal <150) Electrolytes: Phos low 2.2; CorrCa wnl, all others WNL Renal: SCr 0.97 (crcl~43) LFTs / TGs: LFTs WNL Prealbumin / albumin: WNL (18.8) / 2.6 (low) Intake / Output; MIVF: D5LRl @ 60 mL/hr GI Imaging: 3/17: CT Abd: SBO, possible related to adhesion. Per surgery, marked improvement Surgeries / Procedures:  3/18: Ex lap with small bowel resection and lysis of adhesions  Central access: PICC ordered 3/19, anticipate placement this afternoon TPN start date: 3/19  Nutritional Goals (per RD recommendation on 3/19):  kCal: 2175-2395, Protein: 108-120, Fluid: >/= 2.2 L/day  Goal TPN rate is 90 mL/hr (provides 117 g of protein and 2263 kcals per day)  Current Nutrition: NPO, TPN  Plan:  Now: potassium phosphate 10 mmol IV x1  At 1800:  IncreaseTPN at 70 mL/hr at 1800  Electrolytes in TPN: Standard concentrations; 69mEq/L of Na, 32mEq/L of K, 11mEq/L of Ca, 62mEq/L of Mg, and increase to 19mmol/L of Phos. Cl:Ac ratio 1:1  Add trace elements to  TPN; MVI on MWF only due to national backorder  Continue Sensitive q6h SSI and adjust as needed   Reduce MIVF to 30 mL/hr at 1800  Monitor TPN labs on Mon/Thurs, check electrolytes tomorrow as pt at risk for refeeding syndrome  30m, PharmD 01/14/2020,8:26 AM

## 2020-01-15 DIAGNOSIS — K56609 Unspecified intestinal obstruction, unspecified as to partial versus complete obstruction: Secondary | ICD-10-CM

## 2020-01-15 DIAGNOSIS — I10 Essential (primary) hypertension: Secondary | ICD-10-CM

## 2020-01-15 DIAGNOSIS — R011 Cardiac murmur, unspecified: Secondary | ICD-10-CM

## 2020-01-15 LAB — CBC
HCT: 26.8 % — ABNORMAL LOW (ref 36.0–46.0)
Hemoglobin: 8.7 g/dL — ABNORMAL LOW (ref 12.0–15.0)
MCH: 34.7 pg — ABNORMAL HIGH (ref 26.0–34.0)
MCHC: 32.5 g/dL (ref 30.0–36.0)
MCV: 106.8 fL — ABNORMAL HIGH (ref 80.0–100.0)
Platelets: 95 10*3/uL — ABNORMAL LOW (ref 150–400)
RBC: 2.51 MIL/uL — ABNORMAL LOW (ref 3.87–5.11)
RDW: 14.5 % (ref 11.5–15.5)
WBC: 9.5 10*3/uL (ref 4.0–10.5)
nRBC: 0 % (ref 0.0–0.2)

## 2020-01-15 LAB — COMPREHENSIVE METABOLIC PANEL
ALT: 8 U/L (ref 0–44)
AST: 13 U/L — ABNORMAL LOW (ref 15–41)
Albumin: 1.8 g/dL — ABNORMAL LOW (ref 3.5–5.0)
Alkaline Phosphatase: 45 U/L (ref 38–126)
Anion gap: 5 (ref 5–15)
BUN: 19 mg/dL (ref 8–23)
CO2: 28 mmol/L (ref 22–32)
Calcium: 8.2 mg/dL — ABNORMAL LOW (ref 8.9–10.3)
Chloride: 108 mmol/L (ref 98–111)
Creatinine, Ser: 0.62 mg/dL (ref 0.44–1.00)
GFR calc Af Amer: >60 mL/min (ref >60)
GFR calc non Af Amer: >60 mL/min (ref >60)
Glucose, Bld: 179 mg/dL — ABNORMAL HIGH (ref 70–99)
Potassium: 3.4 mmol/L — ABNORMAL LOW (ref 3.5–5.1)
Sodium: 141 mmol/L (ref 135–145)
Total Bilirubin: 0.4 mg/dL (ref 0.3–1.2)
Total Protein: 4.4 g/dL — ABNORMAL LOW (ref 6.5–8.1)

## 2020-01-15 LAB — GLUCOSE, CAPILLARY
Glucose-Capillary: 144 mg/dL — ABNORMAL HIGH (ref 70–99)
Glucose-Capillary: 167 mg/dL — ABNORMAL HIGH (ref 70–99)
Glucose-Capillary: 107 mg/dL — ABNORMAL HIGH (ref 70–99)

## 2020-01-15 LAB — MAGNESIUM: Magnesium: 1.8 mg/dL (ref 1.7–2.4)

## 2020-01-15 LAB — PHOSPHORUS: Phosphorus: 2.3 mg/dL — ABNORMAL LOW (ref 2.5–4.6)

## 2020-01-15 MED ORDER — MAGNESIUM SULFATE 2 GM/50ML IV SOLN
2.0000 g | Freq: Once | INTRAVENOUS | Status: AC
  Administered 2020-01-15: 2 g via INTRAVENOUS
  Filled 2020-01-15: qty 50

## 2020-01-15 MED ORDER — TRAVASOL 10 % IV SOLN
INTRAVENOUS | Status: AC
  Filled 2020-01-15: qty 1166.4

## 2020-01-15 MED ORDER — LIP MEDEX EX OINT
TOPICAL_OINTMENT | CUTANEOUS | Status: AC
  Administered 2020-01-15: 1
  Filled 2020-01-15: qty 7

## 2020-01-15 MED ORDER — DEXTROSE IN LACTATED RINGERS 5 % IV SOLN: INTRAVENOUS | Status: DC

## 2020-01-15 MED ORDER — POTASSIUM CHLORIDE 10 MEQ/100ML IV SOLN
10.0000 meq | INTRAVENOUS | Status: AC
  Administered 2020-01-15 (×2): 10 meq via INTRAVENOUS
  Filled 2020-01-15 (×3): qty 100

## 2020-01-15 MED ORDER — POTASSIUM CHLORIDE 10 MEQ/100ML IV SOLN
10.0000 meq | INTRAVENOUS | Status: AC
  Administered 2020-01-15 (×2): 10 meq via INTRAVENOUS
  Filled 2020-01-15: qty 100

## 2020-01-15 MED ORDER — SODIUM PHOSPHATES 45 MMOLE/15ML IV SOLN
10.0000 mmol | Freq: Once | INTRAVENOUS | Status: AC
  Administered 2020-01-15: 07:00:00 10 mmol via INTRAVENOUS
  Filled 2020-01-15: qty 3.33

## 2020-01-15 NOTE — Progress Notes (Signed)
3 Days Post-Op   Subjective/Chief Complaint:  Complains of poor pain control.  Switched to scheduled toradol and IV tylenol yesterday with dilaudid for breakthrough   Objective: Vital signs in last 24 hours: Temp:  [98.6 F (37 C)-99.6 F (37.6 C)] 99.2 F (37.3 C) (03/21 0553) Pulse Rate:  [85-110] 85 (03/21 0553) Resp:  [10-18] 16 (03/21 0553) BP: (141-174)/(58-74) 158/67 (03/21 0553) SpO2:  [98 %-100 %] 100 % (03/21 0553) Last BM Date: 01/02/20  Intake/Output from previous day: 03/20 0701 - 03/21 0700 In: 3759.7 [I.V.:2465.4; IV Piggyback:1294.3] Out: 7616 [Urine:600; Emesis/NG output:1050] Intake/Output this shift: No intake/output data recorded. abd distended, appropriately tender Incision/Wound:open and clean slight distention tender around the incision   Lab Results:  Recent Labs    01/13/20 0057 01/14/20 0341  WBC 4.1 11.9*  HGB 12.6 10.0*  HCT 38.5 30.6*  PLT 221 144*   BMET Recent Labs    01/14/20 0341 01/15/20 0333  NA 145 141  K 4.3 3.4*  CL 110 108  CO2 28 28  GLUCOSE 148* 179*  BUN 24* 19  CREATININE 0.97 0.62  CALCIUM 8.5* 8.2*   PT/INR No results for input(s): LABPROT, INR in the last 72 hours. ABG No results for input(s): PHART, HCO3 in the last 72 hours.  Invalid input(s): PCO2, PO2  Studies/Results: DG CHEST PORT 1 VIEW  Result Date: 01/14/2020 CLINICAL DATA:  hypoxia. Pt is 2 days post op for exploratory laparotomy lysis of adhesions and small bowel obstruction. EXAM: PORTABLE CHEST 1 VIEW COMPARISON:  Chest radiograph 01/11/2020 FINDINGS: Interval placement of a nasogastric tube with distal aspect below the diaphragm out of field of view. Interval placement of a right upper extremity PICC with tip projecting in the right atrium. The heart size and mediastinal contours are within normal limits. Minimal opacities at the bilateral lung bases likely reflect atelectasis. Lungs are otherwise clear. No pneumothorax or significant pleural  effusion. No acute finding in the visualized skeleton. IMPRESSION: 1. Interval placement of a right upper extremity PICC with tip projecting in the right atrium. 2. Interval placement of a nasogastric tube with distal aspect below the diaphragm out of field of view. 3. Minimal bibasilar atelectasis. Electronically Signed   By: Audie Pinto M.D.   On: 01/14/2020 13:48   DG Abd Portable 1V  Result Date: 01/14/2020 CLINICAL DATA:  Postoperative abdominal pain. EXAM: PORTABLE ABDOMEN - 1 VIEW COMPARISON:  January 12, 2020. FINDINGS: Distal tip of nasogastric tube is seen in the stomach. Status post cholecystectomy. No colonic dilatation is noted. Minimally distended small bowel loops are seen in the left side of the abdomen which may represent ileus. IMPRESSION: Distal tip of nasogastric tube seen in stomach. Minimally distended small bowel loops are seen in left side of the abdomen which may represent ileus. Electronically Signed   By: Marijo Conception M.D.   On: 01/14/2020 13:55   [place] EKG SITE RITE  Result Date: 01/13/2020 If Site Rite image not attached, placement could not be confirmed due to current cardiac rhythm.   Anti-infectives: Anti-infectives (From admission, onward)   Start     Dose/Rate Route Frequency Ordered Stop   01/14/20 1400  metroNIDAZOLE (FLAGYL) IVPB 500 mg     500 mg 100 mL/hr over 60 Minutes Intravenous Every 8 hours 01/14/20 1203     01/14/20 1300  ciprofloxacin (CIPRO) IVPB 400 mg     400 mg 200 mL/hr over 60 Minutes Intravenous Every 12 hours 01/14/20 1203  01/13/20 0600  cefoTEtan (CEFOTAN) 2 g in sodium chloride 0.9 % 100 mL IVPB  Status:  Discontinued     2 g 200 mL/hr over 30 Minutes Intravenous On call to O.R. 01/12/20 1749 01/12/20 1903   01/12/20 0600  cefoTEtan (CEFOTAN) 2 g in sodium chloride 0.9 % 100 mL IVPB     2 g 200 mL/hr over 30 Minutes Intravenous On call to O.R. 01/11/20 1022 01/12/20 1213      Assessment/Plan: s/p Procedure(s): EXPLORATORY  LAPAROTOMY LYSIS OF ADHESIONS AND SMALL BOWEL OBSTRUCTION (N/A) Patient Active Problem List   Diagnosis Date Noted  . Murmur, heart 01/11/2020  . SBO (small bowel obstruction) (HCC) 01/11/2020  . Small bowel obstruction (HCC) 12/29/2019  . Pressure injury of skin 10/29/2019  . Colitis 10/28/2019  . AKI (acute kidney injury) (HCC) 10/27/2019  . Syncope 10/27/2019  . Hypokalemia 10/27/2019  . Essential hypertension 10/27/2019  . Malnutrition of moderate degree 09/07/2019  . Cholecystitis 09/04/2019  . Altered mental status 01/18/2018  . Altered mental state 01/17/2018  PICC TNA for prolonged ileus Cont multimodality pain control DVT prevention PAS/SQ heparin   LOS: 4 days    Samantha Gonzales 01/15/2020

## 2020-01-15 NOTE — Progress Notes (Signed)
Patient's daughter [name] called and updated on patient's condition.

## 2020-01-15 NOTE — Progress Notes (Signed)
PHARMACY - TOTAL PARENTERAL NUTRITION CONSULT NOTE   Indication: Prolonged ileus  Patient Measurements: Height: 6\' 1"  (185.4 cm) Weight: 122 lb 12.7 oz (55.7 kg) IBW/kg (Calculated) : 75.4 TPN AdjBW (KG): 72.6 Body mass index is 16.2 kg/m. Usual Weight: ~81 kg  Assessment: Pt admitted with abdominal pain/vomiting. Pt has extensive GI surgical history: lap chole 09/05/19; ERCP, sphincterotomy, stent placement with removal of sludge on 09/07/19 and developed gallbladder fossa abscess and had CT guided transgluteal drain placement. Recently admitted from 3/4 - 3/9 with SBO, improved on small bowel protocol and NGT placement prior to discharge. Pt presented back to ED on 3/18 with complaints of abdominal pain: CT shows bowel obstruction. Pt has had recent weight loss of 19 pounds since 09/06/20. Pharmacy consulted to initiate TPN.   Glucose / Insulin: No hx DM.  - on sSSI q6h (used 2 units since TPN rate increased to 70 ml/hr at [facility] on 3/20) -CBGs all <150 except for one reading of 179 (Goal <150) Electrolytes: K 3.4, Phos low 2.3 Mag 1.8; all others WNL Renal: SCr 0.62 (crcl~43) LFTs / TGs: LFTs WNL Prealbumin / albumin: WNL (18.8) / 2.6 (low) Intake / Output; MIVF: D5LR @ 30 mL/hr GI Imaging: 3/17: CT Abd: SBO, possible related to adhesion. Per surgery, marked improvement Surgeries / Procedures:  3/18: Ex lap with small bowel resection and lysis of adhesions  Central access: PICC ordered 3/19, anticipate placement this afternoon TPN start date: 3/19  Nutritional Goals (per RD recommendation on 3/19):  kCal: 2175-2395, Protein: 108-120, Fluid: >/= 2.2 L/day  Goal TPN rate is 90 mL/hr (provides 117 g of protein and 2263 kcals per day)  Current Nutrition: NPO, TPN  Plan:  Now:  - sodium phosphate 10 mmol IV x1 (ordered by MD) - potassium chloride 10 mEq IV x 2 runs (ordered by MD). Will order 2 additional runs to get 4 runs total - magnesium 2gm IV x1  At 1800:  Increase TPN  to goal rate of 90 mL/hr at 1800  Electrolytes in TPN: Standard concentrations; 52mEq/L of Na, 34mEq/L of K, 78mEq/L of Ca, 2mEq/L of Mg, and increase to 25 mmol/L of Phos. Cl:Ac ratio 1:1  Add trace elements to TPN; MVI on MWF only due to national backorder  Continue Sensitive q6h SSI and adjust as needed   Reduce MIVF to 10 mL/hr at 1800  Monitor TPN labs on Mon/Thurs  4m, PharmD 01/15/2020,8:25 AM

## 2020-01-15 NOTE — Consult Note (Signed)
Cardiology Consultation:   Patient ID: Samantha Gonzales MRN: [id number]; DOB: Feb 16, 1941  Admit date: 01/11/2020 Date of Consult: 01/15/2020  Primary Care Provider: System, Pcp Not In Primary Cardiologist: No primary care provider on file. New Primary Electrophysiologist:  None    Patient Profile:   Samantha Gonzales is a 79 y.o. female with a hx of longstanding hypertension, cognitive impairment who is being seen today for the evaluation of possible hypertrophic cardiomyopathy at the request of Dr Margo Aye.  History of Present Illness:   Samantha Gonzales is a 79 year old female with admission for small bowel obstruction status post lysis of adhesions on 01/12/2020 who recently had echocardiogram which demonstrated potential hypertrophic cardiomyopathy variant versus longstanding hypertension with hyperdynamic function.  I personally reviewed the echocardiogram and there is left ventricular hypertrophy present, generalized with mitral valve systolic anterior motion hyperdynamic function with increased left ventricular outflow tract gradient.  Mild mitral regurgitation is also noted.  Samantha Gonzales was not complaining of any particular cardiac symptoms such as chest pain or shortness of breath.  Her main admitting complaint was abdominal discomfort which is still ongoing.  Samantha Gonzales lives with her children.  Samantha Gonzales denies any unexplained syncope (although this is listed on her past medical history).  Samantha Gonzales denies any cousins brothers or sisters with early sudden cardiac death.  Samantha Gonzales states that Samantha Gonzales has had a murmur for the past 20 or 30 years.  Still asking for something for pain.  Asking for water.  Blood pressures have been fairly elevated, 170 range at times.     Past Medical History:  Diagnosis Date  . Hyperlipidemia   . Hypertension   . Syncope 10/27/2019    Past Surgical History:  Procedure Laterality Date  . CHOLECYSTECTOMY N/A 09/05/2019   Procedure: LAPAROSCOPIC CHOLECYSTECTOMY WITH INTRAOPERATIVE  CHOLANGIOGRAM;  Surgeon: Harriette Bouillon, MD;  Location: [facility] OR;  Service: General;  Laterality: N/A;  . ERCP N/A 09/07/2019   Procedure: ENDOSCOPIC RETROGRADE CHOLANGIOPANCREATOGRAPHY (ERCP);  Surgeon: Vida Rigger, MD;  Location: [facility] ENDOSCOPY;  Service: Endoscopy;  Laterality: N/A;  . LAPAROTOMY N/A 01/12/2020   Procedure: EXPLORATORY LAPAROTOMY LYSIS OF ADHESIONS AND SMALL BOWEL OBSTRUCTION;  Surgeon: Harriette Bouillon, MD;  Location: [facility] ORS;  Service: General;  Laterality: N/A;  . PANCREATIC STENT PLACEMENT  09/07/2019   Procedure: PANCREATIC STENT PLACEMENT;  Surgeon: Vida Rigger, MD;  Location[facility] ENDOSCOPY;  Service: Endoscopy;;  . REMOVAL OF STONES  09/07/2019   Procedure: REMOVAL OF STONES;  Surgeon: Vida Rigger, MD;  Location: [facility] ENDOSCOPY;  Service: Endoscopy;;  . Dennison Mascot  09/07/2019   Procedure: Dennison Mascot;  Surgeon: Vida Rigger, MD;  Location: [facility] ENDOSCOPY;  Service: Endoscopy;;  . TUBAL LIGATION       Home Medications:  Prior to Admission medications   Medication Sig Start Date End Date Taking? Authorizing Provider  amLODipine (NORVASC) 10 MG tablet Take 10 mg by mouth daily. 09/25/19  Yes [provider]  rosuvastatin (CRESTOR) 20 MG tablet Take 20 mg by mouth daily. 07/30/18  Yes [provider]  HYDROcodone-acetaminophen (NORCO) 7.5-325 MG tablet Take 1 tablet by mouth 4 (four) times daily as needed for pain. 09/27/19   [provider]  polyethylene glycol (MIRALAX / GLYCOLAX) 17 g packet Take 17 g by mouth daily. Patient not taking: Reported on 01/11/2020 01/04/20   Towanda Octave, MD  senna (SENOKOT) 8.6 MG TABS tablet Take 1 tablet (8.6 mg total) by mouth daily. Patient not taking: Reported on 01/11/2020 01/04/20   Towanda Octave, MD  Inpatient Medications: Scheduled Meds: . Chlorhexidine Gluconate Cloth  6 each Topical Daily  . heparin  5,000 Units Subcutaneous Q8H  . insulin aspart  0-9 Units Subcutaneous Q6H  . ketorolac  15 mg  Intravenous Q6H   Continuous Infusions: . acetaminophen 1,000 mg (01/15/20 0518)  . ciprofloxacin 400 mg (01/15/20 0017)  . dextrose 5% lactated ringers 30 mL/hr at 01/14/20 1737  . metronidazole 500 mg (01/15/20 0521)  . sodium phosphate  Dextrose 5% IVPB 10 mmol (01/15/20 0656)  . TPN ADULT (ION) 70 mL/hr at 01/14/20 1714   PRN Meds: diphenhydrAMINE **OR** diphenhydrAMINE, HYDROmorphone (DILAUDID) injection, menthol-cetylpyridinium, metoprolol tartrate, ondansetron **OR** ondansetron (ZOFRAN) IV, sodium chloride flush  Allergies:    Allergies  Allergen Reactions  . Penicillins Hives, Itching and Rash    Has patient had a PCN reaction causing immediate rash, facial/tongue/throat swelling, SOB or lightheadedness with hypotension: Yes Has patient had a PCN reaction causing severe rash involving mucus membranes or skin necrosis: yes Has patient had a PCN reaction that required hospitalization: unk Has patient had a PCN reaction occurring within the last 10 years: unk If all of the above answers are "NO", then may proceed with Cephalosporin use.     Social History:   Social History   Socioeconomic History  . Marital status: Widowed    Spouse name: Not on file  . Number of children: Not on file  . Years of education: Not on file  . Highest education level: Not on file  Occupational History  . Not on file  Tobacco Use  . Smoking status: Never Smoker  . Smokeless tobacco: Current User    Types: Chew  Substance and Sexual Activity  . Alcohol use: No  . Drug use: No  . Sexual activity: Not Currently  Other Topics Concern  . Not on file  Social History Narrative  . Not on file   Social Determinants of Health   Financial Resource Strain:   . Difficulty of Paying Living Expenses:   Food Insecurity:   . Worried About Programme researcher, broadcasting/film/video in the Last Year:   . Barista in the Last Year:   Transportation Needs:   . Freight forwarder (Medical):   Marland Kitchen Lack of  Transportation (Non-Medical):   Physical Activity:   . Days of Exercise per Week:   . Minutes of Exercise per Session:   Stress:   . Feeling of Stress :   Social Connections:   . Frequency of Communication with Friends and Family:   . Frequency of Social Gatherings with Friends and Family:   . Attends Religious Services:   . Active Member of Clubs or Organizations:   . Attends Banker Meetings:   Marland Kitchen Marital Status:   Intimate Partner Violence:   . Fear of Current or Ex-Partner:   . Emotionally Abused:   Marland Kitchen Physically Abused:   . Sexually Abused:     Family History:    Family History  Problem Relation Age of Onset  . CAD Neg Hx      ROS:  Please see the history of present illness.  No syncope no unexplained weight loss, no fevers chills, no bleeding. All other ROS reviewed and negative.     Physical Exam/Data:   Vitals:   01/14/20 1630 01/14/20 2111 01/15/20 0148 01/15/20 0553  BP:  (!) 174/58 (!) 162/74 (!) 158/67  Pulse:  93 92 85  Resp: 16 18 13 16   Temp:  98.9 F (  37.2 C) 98.6 F (37 C) 99.2 F (37.3 C)  TempSrc:  Oral Oral Oral  SpO2: 98% 100% 100% 100%  Weight:      Height:        Intake/Output Summary (Last 24 hours) at 01/15/2020 0838 Last data filed at 01/15/2020 6160 Gross per 24 hour  Intake 3759.68 ml  Output 1650 ml  Net 2109.68 ml   Last 3 Weights 01/13/2020 01/11/2020 12/29/2019  Weight (lbs) 122 lb 12.7 oz 160 lb 117 lb 15.1 oz  Weight (kg) 55.7 kg 72.576 kg 53.5 kg     Body mass index is 16.2 kg/m.  General:  Well nourished, well developed, in no acute distress, still having some abdominal discomfort. HEENT: normal Lymph: no adenopathy Neck: no JVD Endocrine:  No thryomegaly Vascular: No carotid bruits; FA pulses 2+ bilaterally without bruits  Cardiac:  normal S1, S2; RRR; 3/6 systolic murmur noted Lungs:  clear to auscultation bilaterally, no wheezing, rhonchi or rales  Abd: soft, nontender, no hepatomegaly postsurgical wounds  noted Ext: no edema Musculoskeletal:  No deformities, BUE and BLE strength normal and equal Skin: warm and dry  Neuro:  CNs 2-12 intact, no focal abnormalities noted Psych:  Normal affect   EKG:  The EKG was personally reviewed and demonstrates: Sinus rhythm, LVH, early repolarization pattern   Relevant CV Studies:  Echocardiogram 12/30/2019:  1. Left ventricular ejection fraction, by estimation, is 65 to 70%. The  left ventricle has hyperdynamic function. The left ventricle has no  regional wall motion abnormalities. There is moderate left ventricular  hypertrophy. There was mitral valve  systolic anterior motion noted with LV outflow gradient, peak 58 mmHg.  Left ventricular diastolic parameters are consistent with Grade I  diastolic dysfunction (impaired relaxation). 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  2. Right ventricular systolic function is normal. The right ventricular  size is normal. Tricuspid regurgitation signal is inadequate for assessing  PA pressure.  3. The mitral valve is normal in structure and function. Mild mitral  valve regurgitation (not well-visualized). As above, systolic anterior  motion of the mitral valve was noted. No evidence of mitral stenosis.  4. The aortic valve is tricuspid. Aortic valve regurgitation is not  visualized. Mild aortic valve sclerosis is present, with no evidence of  aortic valve stenosis.  5. The inferior vena cava is normal in size with <50% respiratory  variability, suggesting right atrial pressure of 8 mmHg.   Laboratory Data:  High Sensitivity Troponin:   Recent Labs  Lab 12/29/19 1850 12/30/19 0049  TROPONINIHS 54* 48*     Chemistry Recent Labs  Lab 01/13/20 0057 01/14/20 0341 01/15/20 0333  NA 143 145 141  K 3.9 4.3 3.4*  CL 111 110 108  CO2 24 28 28   GLUCOSE 140* 148* 179*  BUN 15 24* 19    CREATININE 1.32* 0.97 0.62  CALCIUM 8.5* 8.5* 8.2*  GFRNONAA 39* 57* >60  GFRAA 45* >60 >60  ANIONGAP 8 7 5     Recent Labs  Lab 01/13/20 0057 01/14/20 0341 01/15/20 0333  PROT 4.6* 4.7* 4.4*  ALBUMIN 2.6* 2.2* 1.8*  AST 14* 14* 13*  ALT 10 10 8   ALKPHOS 42 49 45  BILITOT 0.3 0.5 0.4   Hematology Recent Labs  Lab 01/12/20 1925 01/13/20 0057 01/14/20 0341  WBC 2.2* 4.1 11.9*  RBC 3.58* 3.62* 2.88*  HGB 12.5 12.6 10.0*  HCT 38.2 38.5 30.6*  MCV 106.7* 106.4* 106.3*  MCH 34.9* 34.8* 34.7*  MCHC 32.7 32.7 32.7  RDW 14.8 14.7 14.7  PLT 274 221 144*   BNPNo results for input(s): BNP, PROBNP in the last 168 hours.  DDimer No results for input(s): DDIMER in the last 168 hours.   Radiology/Studies:  DG Chest 2 View  Result Date: 01/11/2020 CLINICAL DATA:  Abdominal pain and vomiting EXAM: CHEST - 2 VIEW COMPARISON:  01/17/2018 FINDINGS: Normal heart size and mild aortic tortuosity. There is aortic atherosclerosis. There is no edema, consolidation, effusion, or pneumothorax. IMPRESSION: No evidence of acute disease. Electronically Signed   By: Marnee SpringJonathon  Watts M.D.   On: 01/11/2020 10:04   DG Abdomen 1 View  Result Date: 01/11/2020 CLINICAL DATA:  NG tube placement. EXAM: ABDOMEN - 1 VIEW COMPARISON:  01/02/2020 FINDINGS: There is a nasogastric tube with tip and side port below the level of the GE junction in the expected location of the proximal stomach. Cholecystectomy clips are identified in the right upper quadrant of the abdomen. Gaseous distension of left upper quadrant bowel loops unchanged. IMPRESSION: NG tube tip is in the expected location of the proximal stomach. Electronically Signed   By: Signa Kellaylor  Stroud M.D.   On: 01/11/2020 10:49   DG CHEST PORT 1 VIEW  Result Date: 01/14/2020 CLINICAL DATA:  hypoxia. Pt is 2 days post op for exploratory laparotomy lysis of adhesions and small bowel obstruction. EXAM: PORTABLE CHEST 1 VIEW COMPARISON:  Chest radiograph 01/11/2020  FINDINGS: Interval placement of a nasogastric tube with distal aspect below the diaphragm out of field of view. Interval placement of a right upper extremity PICC with tip projecting in the right atrium. The heart size and mediastinal contours are within normal limits. Minimal opacities at the bilateral lung bases likely reflect atelectasis. Lungs are otherwise clear. No pneumothorax or significant pleural effusion. No acute finding in the visualized skeleton. IMPRESSION: 1. Interval placement of a right upper extremity PICC with tip projecting in the right atrium. 2. Interval placement of a nasogastric tube with distal aspect below the diaphragm out of field of view. 3. Minimal bibasilar atelectasis. Electronically Signed   By: Emmaline KluverNancy  Ballantyne M.D.   On: 01/14/2020 13:48   DG Abd Portable 1V  Result Date: 01/14/2020 CLINICAL DATA:  Postoperative abdominal pain. EXAM: PORTABLE ABDOMEN - 1 VIEW COMPARISON:  January 12, 2020. FINDINGS: Distal tip of nasogastric tube is seen in the stomach. Status post cholecystectomy. No colonic dilatation is noted. Minimally distended small bowel loops are seen in the left side of the abdomen which may represent ileus. IMPRESSION: Distal tip of nasogastric tube seen in stomach. Minimally distended small bowel loops are seen in left side of the abdomen which may represent ileus. Electronically Signed   By: Lupita RaiderJames  Green Jr M.D.   On: 01/14/2020 13:55   DG Abd Portable 1V  Result Date: 01/12/2020 CLINICAL DATA:  Small bowel obstruction EXAM: PORTABLE ABDOMEN - 1 VIEW COMPARISON:  January 11, 2020 FINDINGS: Nasogastric tube tip and side port are in the stomach. Currently there is no appreciable bowel dilatation or air-fluid levels. No free air. There are surgical clips in the right upper quadrant. Visualized lung bases are clear. IMPRESSION: Nasogastric tube tip and side port are in the stomach. No appreciable bowel dilatation or or air-fluid level currently. No free air.  Appearance consistent with resolving bowel obstruction. Lung bases clear. Electronically Signed   By: Bretta BangWilliam  Woodruff III M.D.   On: 01/12/2020 08:44   [place]  EKG SITE RITE  Result Date: 01/13/2020 If Site Rite image not attached, placement could not be confirmed due to current cardiac rhythm.        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  Cannot fully exclude the possibility of a hypertrophic variant however there is not appear to be any high risk features such as unexplained syncope, early family history of sudden cardiac death,  ventricular tachycardia, left ventricular wall diameter less than 3 cm. -I would continue to advocate for blood pressure control.  Adequate hydration is important so as not to exacerbate the outflow tract gradient.  Samantha Gonzales successfully traversed general anesthesia without difficulty. -Add back Crestor when able.  Minimally elevated high-sensitivity troponin -Flat pattern, compatible with hypertension, underlying small bowel obstruction, demand ischemia.  Not ACS.  Similar pattern from December 31 to December 30, 2019.  No further recommendations at this time.  Please let us know if we can be of further assistance.  Thank you.      For questions or updates, please contact [place] Please consult [email or web address] for contact info under     Signed, Candee Furbish, MD  01/15/2020 8:38 AM

## 2020-01-15 NOTE — Progress Notes (Addendum)
Consult PROGRESS NOTE  Samantha Gonzales BJS:[id number] DOB: 12-May-1941 DOA: 01/11/2020 PCP: System, Pcp Not In   Requesting physician: Charleston Ropes, MD Reason for consultation: Medical clearance for surgery, cardiac murmur           Impression   -Small bowel obstruction s/p exlap lysis of adhesions 3/18 Dr. Luisa Hart   -Murmur, heart   -Possible hypertrophic cardiomyopathy 2D echo 3/5.   -Essential HTN   -HLD   -AKI   -Hypomagnesemia   -Hypophosphatemia   -Leukocytosis, neutrophilia, elevated procalcitonin   -Tachycardia   -Cognitive impairment per her daughter   Recommendations: -Contacted Cardiology.  Appreciate their input.  -Blood cultures negative to date, continue to follow cultures -Urine culture in process -Elevated procalcitonin up to 43.  Repeat labs.   -Started IV Cipro and IV Flagyl until an active infective process can be ruled out. Allergic to Penicillin. -Potassium and phosphorus replaced, also on TPN.  -Continue IV fluid hydration; avoid dehydration due to possible hypertrophic cardiomyopathy. -Continue electrolytes replacement; goal K+ >4.0, mag>2.0 -Mentation is improved today.  Thank you for this consultation.  Our [facility] hospitalist team will follow the patient with you.   HPI/Recap of past 24 hours: Samantha Gonzales is an 79 y.o. female with a h/o HTN, HLD and mild Mitral regurgitation who presents to the hospital with abdominal pain and vomiting.   Lap Cholecystectomy for acute cholecystitis in 11/20  ERCP sphincterotomy with stenting of the ventral pancreatic duct on 09/07/19  Admitted from 12/31 - 10/31/19 for colitis   Admitted on 12/29/19 to the [facility] practice teaching service for SBO and  discharged on 01/03/20.  Her CT again is showing a SBO and we are asked for medical clearance due to her h/o a murmur.   3/21: Seen and examined.  More alert today and answering questions more appropriately.  NG tube to suction.  Admits to flatus x2.  Asking for pain  medication.    Assessment/Plan: Principal Problem:   Small bowel obstruction (HCC) Active Problems:   Essential hypertension   Murmur, heart   SBO (small bowel obstruction) (HCC)     Objective: Vitals:   01/15/20 0553 01/15/20 1016 01/15/20 1152 01/15/20 1312  BP: (!) 158/67 (!) 161/61 140/62 137/65  Pulse: 85 86 94 96  Resp: 16 16 15 20   Temp: 99.2 F (37.3 C) 97.9 F (36.6 C) 98.8 F (37.1 C) 98.9 F (37.2 C)  TempSrc: Oral Oral Oral Oral  SpO2: 100%  100% 100%  Weight:      Height:        Intake/Output Summary (Last 24 hours) at 01/15/2020 1340 Last data filed at 01/15/2020 1155 Gross per 24 hour  Intake 3537.68 ml  Output 1950 ml  Net 1587.68 ml   Filed Weights   01/11/20 0317 01/13/20 1132  Weight: 72.6 kg 55.7 kg    Exam:  . General: 79 y.o. year-old female well-developed well-nourished no acute distress.  Alert and oriented x2.   . Cardiovascular: Regular rate and rhythm no rubs or gallops.   73 Respiratory: Mild rales at bases no wheezing noted.   . Abdomen: Surgical dressing in place mid-abdomen.  Hypoactive bowel sounds. . Musculoskeletal: Mild dependent edema in upper extremities.  No lower extremity edema bilaterally.  Data Reviewed: CBC: Recent Labs  Lab 01/11/20 0615 01/11/20 0615 01/12/20 0626 01/12/20 1925 01/13/20 0057 01/14/20 0341 01/15/20 0704  WBC 11.7*   < > 6.3 2.2* 4.1 11.9* 9.5  NEUTROABS 10.7*  --   --   --   --  11.2*  --   HGB 13.7   < > 10.7* 12.5 12.6 10.0* 8.7*  HCT 40.7   < > 32.0* 38.2 38.5 30.6* 26.8*  MCV 103.8*   < > 104.2* 106.7* 106.4* 106.3* 106.8*  PLT 438*   < > 326 274 221 144* 95*   < > = values in this interval not displayed.   Basic Metabolic Panel: Recent Labs  Lab 01/11/20 0728 01/11/20 1213 01/12/20 0626 01/13/20 0057 01/13/20 0939 01/14/20 0341 01/15/20 0333  NA 139  --  144 143  --  145 141  K 3.5  --  3.9 3.9  --  4.3 3.4*  CL 105  --  112* 111  --  110 108  CO2 23  --  26 24  --  28 28   GLUCOSE 128*  --  118* 140*  --  148* 179*  BUN 10  --  12 15  --  24* 19  CREATININE 0.84  --  0.78 1.32*  --  0.97 0.62  CALCIUM 9.3  --  9.1 8.5*  --  8.5* 8.2*  MG  --  2.0 1.9  --  1.6* 2.1 1.8  PHOS  --   --   --   --  3.9 2.2* 2.3*   GFR: Estimated Creatinine Clearance: 52.6 mL/min (by C-G formula based on SCr of 0.62 mg/dL). Liver Function Tests: Recent Labs  Lab 01/11/20 0728 01/13/20 0057 01/14/20 0341 01/15/20 0333  AST 21 14* 14* 13*  ALT 12 10 10 8   ALKPHOS 81 42 49 45  BILITOT 0.5 0.3 0.5 0.4  PROT 6.5 4.6* 4.7* 4.4*  ALBUMIN 3.5 2.6* 2.2* 1.8*   Recent Labs  Lab 01/11/20 0728  LIPASE 21   No results for input(s): AMMONIA in the last 168 hours. Coagulation Profile: Recent Labs  Lab 01/12/20 0626  INR 1.0   Cardiac Enzymes: No results for input(s): CKTOTAL, CKMB, CKMBINDEX, TROPONINI in the last 168 hours. BNP (last 3 results) No results for input(s): PROBNP in the last 8760 hours. HbA1C: No results for input(s): HGBA1C in the last 72 hours. CBG: Recent Labs  Lab 01/14/20 1114 01/14/20 1704 01/14/20 2341 01/15/20 0616 01/15/20 1147  GLUCAP 142* 110* 150* 144* 167*   Lipid Profile: Recent Labs    01/14/20 0341  TRIG 32   Thyroid Function Tests: No results for input(s): TSH, T4TOTAL, FREET4, T3FREE, THYROIDAB in the last 72 hours. Anemia Panel: No results for input(s): VITAMINB12, FOLATE, FERRITIN, TIBC, IRON, RETICCTPCT in the last 72 hours. Urine analysis:    Component Value Date/Time   COLORURINE YELLOW 01/11/2020 0746   APPEARANCEUR CLEAR 01/11/2020 0746   LABSPEC 1.021 01/11/2020 0746   PHURINE 5.0 01/11/2020 0746   GLUCOSEU NEGATIVE 01/11/2020 0746   HGBUR SMALL (A) 01/11/2020 0746   BILIRUBINUR NEGATIVE 01/11/2020 0746   KETONESUR 5 (A) 01/11/2020 0746   PROTEINUR 100 (A) 01/11/2020 0746   NITRITE NEGATIVE 01/11/2020 0746   LEUKOCYTESUR SMALL (A) 01/11/2020 0746   Sepsis  Labs: @LABRCNTIP (procalcitonin:4,lacticidven:4)  ) Recent Results (from the past 240 hour(s))  Respiratory Panel by RT PCR (Flu A&B, Covid) - Nasopharyngeal Swab     Status: None   Collection Time: 01/11/20  7:28 AM   Specimen: Nasopharyngeal Swab  Result Value Ref Range Status   SARS Coronavirus 2 by RT PCR NEGATIVE NEGATIVE Final    Comment: (NOTE) SARS-CoV-2 target nucleic acids are NOT DETECTED. The SARS-CoV-2 RNA is generally detectable in upper respiratoy specimens  during the acute phase of infection. The lowest concentration of SARS-CoV-2 viral copies this assay can detect is 131 copies/mL. A negative result does not preclude SARS-Cov-2 infection and should not be used as the sole basis for treatment or other patient management decisions. A negative result may occur with  improper specimen collection/handling, submission of specimen other than nasopharyngeal swab, presence of viral mutation(s) within the areas targeted by this assay, and inadequate number of viral copies (<131 copies/mL). A negative result must be combined with clinical observations, patient history, and epidemiological information. The expected result is Negative. Fact Sheet for Patients:  https://www.moore.com/ Fact Sheet for Healthcare Providers:  https://www.young.biz/ This test is not yet ap proved or cleared by the [place] FDA and  has been authorized for detection and/or diagnosis of SARS-CoV-2 by FDA under an Emergency Use Authorization (EUA). This EUA will remain  in effect (meaning this test can be used) for the duration of the COVID-19 declaration under Section 564(b)(1) of the Act, 21 U.S.C. section 360bbb-3(b)(1), unless the authorization is terminated or revoked sooner.    Influenza A by PCR NEGATIVE NEGATIVE Final   Influenza B by PCR NEGATIVE NEGATIVE Final    Comment: (NOTE) The Xpert Xpress SARS-CoV-2/FLU/RSV assay is intended as an aid in   the diagnosis of influenza from Nasopharyngeal swab specimens and  should not be used as a sole basis for treatment. Nasal washings and  aspirates are unacceptable for Xpert Xpress SARS-CoV-2/FLU/RSV  testing. Fact Sheet for Patients: https://www.moore.com/ Fact Sheet for Healthcare Providers: https://www.young.biz/ This test is not yet approved or cleared by the [place] FDA and  has been authorized for detection and/or diagnosis of SARS-CoV-2 by  FDA under an Emergency Use Authorization (EUA). This EUA will remain  in effect (meaning this test can be used) for the duration of the  Covid-19 declaration under Section 564(b)(1) of the Act, 21  U.S.C. section 360bbb-3(b)(1), unless the authorization is  terminated or revoked. Performed at [facility], [address], [place], [place] [address]   Surgical pcr screen     Status: None   Collection Time: 01/12/20 11:20 AM   Specimen: Nasal Mucosa; Nasal Swab  Result Value Ref Range Status   MRSA, PCR NEGATIVE NEGATIVE Final   Staphylococcus aureus NEGATIVE NEGATIVE Final    Comment: (NOTE) The Xpert SA Assay (FDA approved for NASAL specimens in patients 7 years of age and older), is one component of a comprehensive surveillance program. It is not intended to diagnose infection nor to guide or monitor treatment. Performed at [facility], [address], [place], [place] [address]   Culture, blood (routine x 2)     Status: None (Preliminary result)   Collection Time: 01/14/20 12:02 PM   Specimen: BLOOD  Result Value Ref Range Status   Specimen Description   Final    BLOOD Performed at [facility], [address] [place], [place] [address]    Special Requests   Final    BOTTLES DRAWN AEROBIC AND ANAEROBIC Blood Culture adequate volume Performed at [facility], [address] [place], [place] [address]     Culture   Final    NO GROWTH < 24 HOURS Performed at [facility], [address] [place], [place] [address]    Report Status PENDING  Incomplete  Culture, blood (routine x 2)     Status: None (Preliminary result)   Collection Time: 01/14/20 12:10 PM  Specimen: BLOOD  Result Value Ref Range Status   Specimen Description   Final    BLOOD LEFT WRIST Performed at [place] [address] [place], [place] [address]    Special Requests   Final    BOTTLES DRAWN AEROBIC AND ANAEROBIC Blood Culture adequate volume Performed at [place] [address], [place], [place] [address]    Culture   Final    NO GROWTH < 24 HOURS Performed at [facility] [address] [place], [place] [address]    Report Status PENDING  Incomplete      Studies: No results found.  Scheduled Meds: . Chlorhexidine Gluconate Cloth  6 each Topical Daily  . heparin  5,000 Units Subcutaneous Q8H  . insulin aspart  0-9 Units Subcutaneous Q6H  . ketorolac  15 mg Intravenous Q6H    Continuous Infusions: . ciprofloxacin 400 mg (01/15/20 1326)  . dextrose 5% lactated ringers 30 mL/hr at 01/14/20 1737  . dextrose 5% lactated ringers    . magnesium sulfate bolus IVPB 2 g (01/15/20 1323)  . metronidazole 500 mg (01/15/20 0521)  . TPN ADULT (ION) 70 mL/hr at 01/14/20 1714  . TPN ADULT (ION)       LOS: 4 days     Kayleen Memos, MD [facility] Pager [phone]  If 7PM-7AM, please contact night-coverage [email or web address] Password [email or web address] 01/15/2020, 1:40 PM

## 2020-01-16 LAB — DIFFERENTIAL
Abs Immature Granulocytes: 0.03 10*3/uL (ref 0.00–0.07)
Basophils Absolute: 0 10*3/uL (ref 0.0–0.1)
Basophils Relative: 0 %
Eosinophils Absolute: 0.2 10*3/uL (ref 0.0–0.5)
Eosinophils Relative: 3 %
Immature Granulocytes: 0 %
Lymphocytes Relative: 5 %
Lymphs Abs: 0.4 10*3/uL — ABNORMAL LOW (ref 0.7–4.0)
Monocytes Absolute: 0.3 10*3/uL (ref 0.1–1.0)
Monocytes Relative: 4 %
Neutro Abs: 6.3 10*3/uL (ref 1.7–7.7)
Neutrophils Relative %: 88 %

## 2020-01-16 LAB — CBC
HCT: 24.6 % — ABNORMAL LOW (ref 36.0–46.0)
HCT: 24.5 % — ABNORMAL LOW (ref 36.0–46.0)
Hemoglobin: 7.9 g/dL — ABNORMAL LOW (ref 12.0–15.0)
Hemoglobin: 8 g/dL — ABNORMAL LOW (ref 12.0–15.0)
MCH: 34.1 pg — ABNORMAL HIGH (ref 26.0–34.0)
MCH: 34.6 pg — ABNORMAL HIGH (ref 26.0–34.0)
MCHC: 32.1 g/dL (ref 30.0–36.0)
MCHC: 32.7 g/dL (ref 30.0–36.0)
MCV: 106 fL — ABNORMAL HIGH (ref 80.0–100.0)
MCV: 106.1 fL — ABNORMAL HIGH (ref 80.0–100.0)
Platelets: 85 10*3/uL — ABNORMAL LOW (ref 150–400)
Platelets: 84 10*3/uL — ABNORMAL LOW (ref 150–400)
RBC: 2.32 MIL/uL — ABNORMAL LOW (ref 3.87–5.11)
RBC: 2.31 MIL/uL — ABNORMAL LOW (ref 3.87–5.11)
RDW: 14.3 % (ref 11.5–15.5)
RDW: 14.3 % (ref 11.5–15.5)
WBC: 7.2 10*3/uL (ref 4.0–10.5)
WBC: 7.8 10*3/uL (ref 4.0–10.5)
nRBC: 0 % (ref 0.0–0.2)
nRBC: 0 % (ref 0.0–0.2)

## 2020-01-16 LAB — COMPREHENSIVE METABOLIC PANEL
ALT: 7 U/L (ref 0–44)
AST: 11 U/L — ABNORMAL LOW (ref 15–41)
Albumin: 1.8 g/dL — ABNORMAL LOW (ref 3.5–5.0)
Alkaline Phosphatase: 39 U/L (ref 38–126)
Anion gap: 6 (ref 5–15)
BUN: 20 mg/dL (ref 8–23)
CO2: 31 mmol/L (ref 22–32)
Calcium: 8.3 mg/dL — ABNORMAL LOW (ref 8.9–10.3)
Chloride: 104 mmol/L (ref 98–111)
Creatinine, Ser: 0.55 mg/dL (ref 0.44–1.00)
GFR calc Af Amer: >60 mL/min (ref >60)
GFR calc non Af Amer: >60 mL/min (ref >60)
Glucose, Bld: 106 mg/dL — ABNORMAL HIGH (ref 70–99)
Potassium: 3.5 mmol/L (ref 3.5–5.1)
Sodium: 141 mmol/L (ref 135–145)
Total Bilirubin: 0.3 mg/dL (ref 0.3–1.2)
Total Protein: 4.3 g/dL — ABNORMAL LOW (ref 6.5–8.1)

## 2020-01-16 LAB — URINE CULTURE: Culture: NO GROWTH

## 2020-01-16 LAB — PREALBUMIN: Prealbumin: 8.1 mg/dL — ABNORMAL LOW (ref 18–38)

## 2020-01-16 LAB — TRIGLYCERIDES: Triglycerides: 58 mg/dL (ref <150)

## 2020-01-16 LAB — GLUCOSE, CAPILLARY
Glucose-Capillary: 134 mg/dL — ABNORMAL HIGH (ref 70–99)
Glucose-Capillary: 147 mg/dL — ABNORMAL HIGH (ref 70–99)
Glucose-Capillary: 154 mg/dL — ABNORMAL HIGH (ref 70–99)
Glucose-Capillary: 156 mg/dL — ABNORMAL HIGH (ref 70–99)
Glucose-Capillary: 97 mg/dL (ref 70–99)

## 2020-01-16 LAB — SURGICAL PATHOLOGY

## 2020-01-16 LAB — PHOSPHORUS: Phosphorus: 2.7 mg/dL (ref 2.5–4.6)

## 2020-01-16 LAB — PROCALCITONIN: Procalcitonin: 15.91 ng/mL

## 2020-01-16 LAB — MAGNESIUM: Magnesium: 2.1 mg/dL (ref 1.7–2.4)

## 2020-01-16 MED ORDER — POTASSIUM CHLORIDE 10 MEQ/100ML IV SOLN
10.0000 meq | INTRAVENOUS | Status: AC
  Administered 2020-01-16 (×3): 10 meq via INTRAVENOUS
  Filled 2020-01-16 (×3): qty 100

## 2020-01-16 MED ORDER — FENTANYL CITRATE (PF) 100 MCG/2ML IJ SOLN
12.5000 ug | Freq: Once | INTRAMUSCULAR | Status: AC
  Administered 2020-01-16: 12.5 ug via INTRAVENOUS
  Filled 2020-01-16: qty 2

## 2020-01-16 MED ORDER — POTASSIUM CHLORIDE 10 MEQ/100ML IV SOLN
10.0000 meq | INTRAVENOUS | Status: AC
  Administered 2020-01-16 (×2): 10 meq via INTRAVENOUS
  Filled 2020-01-16 (×2): qty 100

## 2020-01-16 MED ORDER — FOLIC ACID 5 MG/ML IJ SOLN
1.0000 mg | Freq: Every day | INTRAMUSCULAR | Status: DC
  Administered 2020-01-16 – 2020-01-18 (×2): 1 mg via INTRAVENOUS
  Filled 2020-01-16 (×3): qty 0.2

## 2020-01-16 MED ORDER — TRAVASOL 10 % IV SOLN
INTRAVENOUS | Status: AC
  Filled 2020-01-16: qty 1166.4

## 2020-01-16 MED ORDER — ACETAMINOPHEN 10 MG/ML IV SOLN
1000.0000 mg | Freq: Four times a day (QID) | INTRAVENOUS | Status: DC
  Administered 2020-01-16: 1000 mg via INTRAVENOUS
  Filled 2020-01-16 (×2): qty 100

## 2020-01-16 MED ORDER — HYDROCODONE-ACETAMINOPHEN 7.5-325 MG PO TABS
1.0000 | ORAL_TABLET | Freq: Four times a day (QID) | ORAL | Status: DC | PRN
  Administered 2020-01-16: 1 via ORAL
  Filled 2020-01-16 (×2): qty 1

## 2020-01-16 MED ORDER — PANTOPRAZOLE SODIUM 40 MG IV SOLR
40.0000 mg | INTRAVENOUS | Status: DC
  Administered 2020-01-16 – 2020-01-18 (×3): 40 mg via INTRAVENOUS
  Filled 2020-01-16 (×3): qty 40

## 2020-01-16 MED ORDER — METHOCARBAMOL 1000 MG/10ML IJ SOLN
500.0000 mg | Freq: Three times a day (TID) | INTRAVENOUS | Status: DC
  Administered 2020-01-16 – 2020-01-17 (×3): 500 mg via INTRAVENOUS
  Filled 2020-01-16: qty 500
  Filled 2020-01-16: qty 5
  Filled 2020-01-16 (×2): qty 500
  Filled 2020-01-16: qty 5

## 2020-01-16 MED ORDER — SODIUM CHLORIDE 0.9 % IV SOLN
510.0000 mg | Freq: Once | INTRAVENOUS | Status: AC
  Administered 2020-01-16: 510 mg via INTRAVENOUS
  Filled 2020-01-16: qty 510

## 2020-01-16 NOTE — Progress Notes (Signed)
Consult PROGRESS NOTE  Samantha Gonzales OQH:[id number] DOB: 06/14/1941 DOA: 01/11/2020 PCP: System, Pcp Not In   Requesting physician: Charleston Ropes, MD Reason for consultation: Medical clearance for surgery, cardiac murmur           Impression   -Small bowel obstruction s/p exlap lysis of adhesions 3/18 Dr. Luisa Hart   -Murmur, heart   -Possible hypertrophic cardiomyopathy 2D echo 3/5.   -Essential HTN   -HLD   -AKI   -Hypomagnesemia   -Hypophosphatemia   -Leukocytosis, neutrophilia, elevated procalcitonin   -Tachycardia   -Cognitive impairment per her daughter   Recommendations: -Contacted Cardiology.  Appreciate their input.  -Blood cultures negative to date, continue to follow cultures -Urine culture in process -Elevated procalcitonin up to 43>> 15 -Started IV Cipro and IV Flagyl until an active infective process can be ruled out. Allergic to Penicillin. -Potassium and phosphorus replaced, also on TPN.  -Continue IV fluid hydration; avoid dehydration due to possible hypertrophic cardiomyopathy. -Continue electrolytes replacement; goal K+ >4.0, mag>2.0 -Mentation is improved today.  Thank you for this consultation.  Our [facility] hospitalist team will follow the patient with you.   HPI/Recap of past 24 hours: Samantha Gonzales is an 79 y.o. female with a h/o HTN, HLD and mild Mitral regurgitation who presents to the hospital with abdominal pain and vomiting.   Lap Cholecystectomy for acute cholecystitis in 11/20  ERCP sphincterotomy with stenting of the ventral pancreatic duct on 09/07/19  Admitted from 12/31 - 10/31/19 for colitis   Admitted on 12/29/19 to the [facility] practice teaching service for SBO and  discharged on 01/03/20.  Her CT again is showing a SBO and we are asked for medical clearance due to her h/o a murmur.   3/22: Reports BM and flatus this AM.     Assessment/Plan: Principal Problem:   Small bowel obstruction (HCC) Active Problems:   Essential  hypertension   Murmur, heart   SBO (small bowel obstruction) (HCC)     Objective: Vitals:   01/15/20 1312 01/15/20 2118 01/16/20 0506 01/16/20 1343  BP: 137/65 (!) 116/57 (!) 139/57 133/67  Pulse: 96 81 83 86  Resp: 20 16 18 17   Temp: 98.9 F (37.2 C) 98.3 F (36.8 C) 97.9 F (36.6 C) 98.3 F (36.8 C)  TempSrc: Oral Oral Oral Oral  SpO2: 100% 100% 100% 100%  Weight:      Height:        Intake/Output Summary (Last 24 hours) at 01/16/2020 1625 Last data filed at 01/16/2020 1400 Gross per 24 hour  Intake 3312.62 ml  Output 2250 ml  Net 1062.62 ml   Filed Weights   01/11/20 0317 01/13/20 1132  Weight: 72.6 kg 55.7 kg    Exam:  . General: 79 y.o. year-old female well-developed well-nourished no acute distress.  Alert and oriented x2.   . Cardiovascular: Regular rate and rhythm no rubs or gallops.   73 Respiratory: Mild rales at bases no wheezing noted.   . Abdomen: Surgical dressing in place mid-abdomen.  Hypoactive bowel sounds. . Musculoskeletal: Mild dependent edema in upper extremities.  No lower extremity edema bilaterally.  Data Reviewed: CBC: Recent Labs  Lab 01/11/20 0615 01/12/20 0626 01/12/20 1925 01/13/20 0057 01/14/20 0341 01/15/20 0704 01/16/20 0347  WBC 11.7*   < > 2.2* 4.1 11.9* 9.5 7.2  NEUTROABS 10.7*  --   --   --  11.2*  --  6.3  HGB 13.7   < > 12.5 12.6 10.0* 8.7* 7.9*  HCT 40.7   < > 38.2 38.5 30.6* 26.8* 24.6*  MCV 103.8*   < > 106.7* 106.4* 106.3* 106.8* 106.0*  PLT 438*   < > 274 221 144* 95* 85*   < > = values in this interval not displayed.   Basic Metabolic Panel: Recent Labs  Lab 01/12/20 0626 01/13/20 0057 01/13/20 0939 01/14/20 0341 01/15/20 0333 01/16/20 0347  NA 144 143  --  145 141 141  K 3.9 3.9  --  4.3 3.4* 3.5  CL 112* 111  --  110 108 104  CO2 26 24  --  28 28 31   GLUCOSE 118* 140*  --  148* 179* 106*  BUN 12 15  --  24* 19 20  CREATININE 0.78 1.32*  --  0.97 0.62 0.55  CALCIUM 9.1 8.5*  --  8.5* 8.2* 8.3*    MG 1.9  --  1.6* 2.1 1.8 2.1  PHOS  --   --  3.9 2.2* 2.3* 2.7   GFR: Estimated Creatinine Clearance: 52.6 mL/min (by C-G formula based on SCr of 0.55 mg/dL). Liver Function Tests: Recent Labs  Lab 01/11/20 0728 01/13/20 0057 01/14/20 0341 01/15/20 0333 01/16/20 0347  AST 21 14* 14* 13* 11*  ALT 12 10 10 8 7   ALKPHOS 81 42 49 45 39  BILITOT 0.5 0.3 0.5 0.4 0.3  PROT 6.5 4.6* 4.7* 4.4* 4.3*  ALBUMIN 3.5 2.6* 2.2* 1.8* 1.8*   Recent Labs  Lab 01/11/20 0728  LIPASE 21   No results for input(s): AMMONIA in the last 168 hours. Coagulation Profile: Recent Labs  Lab 01/12/20 0626  INR 1.0   Cardiac Enzymes: No results for input(s): CKTOTAL, CKMB, CKMBINDEX, TROPONINI in the last 168 hours. BNP (last 3 results) No results for input(s): PROBNP in the last 8760 hours. HbA1C: No results for input(s): HGBA1C in the last 72 hours. CBG: Recent Labs  Lab 01/15/20 1147 01/15/20 1634 01/16/20 0005 01/16/20 0605 01/16/20 1151  GLUCAP 167* 107* 156* 147* 134*   Lipid Profile: Recent Labs    01/14/20 0341 01/16/20 0347  TRIG 32 58   Thyroid Function Tests: No results for input(s): TSH, T4TOTAL, FREET4, T3FREE, THYROIDAB in the last 72 hours. Anemia Panel: No results for input(s): VITAMINB12, FOLATE, FERRITIN, TIBC, IRON, RETICCTPCT in the last 72 hours. Urine analysis:    Component Value Date/Time   COLORURINE YELLOW 01/11/2020 0746   APPEARANCEUR CLEAR 01/11/2020 0746   LABSPEC 1.021 01/11/2020 0746   PHURINE 5.0 01/11/2020 0746   GLUCOSEU NEGATIVE 01/11/2020 0746   HGBUR SMALL (A) 01/11/2020 0746   BILIRUBINUR NEGATIVE 01/11/2020 0746   KETONESUR 5 (A) 01/11/2020 0746   PROTEINUR 100 (A) 01/11/2020 0746   NITRITE NEGATIVE 01/11/2020 0746   LEUKOCYTESUR SMALL (A) 01/11/2020 0746   Sepsis Labs: @LABRCNTIP (procalcitonin:4,lacticidven:4)  ) Recent Results (from the past 240 hour(s))  Respiratory Panel by RT PCR (Flu A&B, Covid) - Nasopharyngeal Swab      Status: None   Collection Time: 01/11/20  7:28 AM   Specimen: Nasopharyngeal Swab  Result Value Ref Range Status   SARS Coronavirus 2 by RT PCR NEGATIVE NEGATIVE Final    Comment: (NOTE) SARS-CoV-2 target nucleic acids are NOT DETECTED. The SARS-CoV-2 RNA is generally detectable in upper respiratoy specimens during the acute phase of infection. The lowest concentration of SARS-CoV-2 viral copies this assay can detect is 131 copies/mL. A negative result does not preclude SARS-Cov-2 infection and should not be used as the sole basis for treatment  or other patient management decisions. A negative result may occur with  improper specimen collection/handling, submission of specimen other than nasopharyngeal swab, presence of viral mutation(s) within the areas targeted by this assay, and inadequate number of viral copies (<131 copies/mL). A negative result must be combined with clinical observations, patient history, and epidemiological information. The expected result is Negative. Fact Sheet for Patients:  https://www.moore.com/ Fact Sheet for Healthcare Providers:  https://www.young.biz/ This test is not yet ap proved or cleared by the [place] FDA and  has been authorized for detection and/or diagnosis of SARS-CoV-2 by FDA under an Emergency Use Authorization (EUA). This EUA will remain  in effect (meaning this test can be used) for the duration of the COVID-19 declaration under Section 564(b)(1) of the Act, 21 U.S.C. section 360bbb-3(b)(1), unless the authorization is terminated or revoked sooner.    Influenza A by PCR NEGATIVE NEGATIVE Final   Influenza B by PCR NEGATIVE NEGATIVE Final    Comment: (NOTE) The Xpert Xpress SARS-CoV-2/FLU/RSV assay is intended as an aid in  the diagnosis of influenza from Nasopharyngeal swab specimens and  should not be used as a sole basis for treatment. Nasal washings and  aspirates are unacceptable for  Xpert Xpress SARS-CoV-2/FLU/RSV  testing. Fact Sheet for Patients: https://www.moore.com/ Fact Sheet for Healthcare Providers: https://www.young.biz/ This test is not yet approved or cleared by the [place] FDA and  has been authorized for detection and/or diagnosis of SARS-CoV-2 by  FDA under an Emergency Use Authorization (EUA). This EUA will remain  in effect (meaning this test can be used) for the duration of the  Covid-19 declaration under Section 564(b)(1) of the Act, 21  U.S.C. section 360bbb-3(b)(1), unless the authorization is  terminated or revoked. Performed at [facility], [address], [place], [place] [address]   Surgical pcr screen     Status: None   Collection Time: 01/12/20 11:20 AM   Specimen: Nasal Mucosa; Nasal Swab  Result Value Ref Range Status   MRSA, PCR NEGATIVE NEGATIVE Final   Staphylococcus aureus NEGATIVE NEGATIVE Final    Comment: (NOTE) The Xpert SA Assay (FDA approved for NASAL specimens in patients 49 years of age and older), is one component of a comprehensive surveillance program. It is not intended to diagnose infection nor to guide or monitor treatment. Performed at [facility], [address] [place], [place] [address]   Culture, Urine     Status: None   Collection Time: 01/14/20 11:50 AM   Specimen: Urine, Random  Result Value Ref Range Status   Specimen Description   Final    URINE, RANDOM Performed at [facility], [address] [place], [place] [address]    Special Requests   Final    NONE Performed at [facility], [address] [place], [place] [address]    Culture   Final    NO GROWTH Performed at [facility], [address], [place], [place] [address]    Report Status 01/16/2020 FINAL  Final  Culture, blood (routine x 2)     Status: None (Preliminary result)   Collection Time: 01/14/20 12:02  PM   Specimen: BLOOD  Result Value Ref Range Status   Specimen Description   Final    BLOOD Performed at [facility], [address] [place], [place] [address]    Special Requests   Final    BOTTLES DRAWN AEROBIC AND ANAEROBIC Blood Culture adequate volume  Performed at [facility], [address], [place], [place] [address]    Culture   Final    NO GROWTH 2 DAYS Performed at [facility], [address], [place], [place] [address]    Report Status PENDING  Incomplete  Culture, blood (routine x 2)     Status: None (Preliminary result)   Collection Time: 01/14/20 12:10 PM   Specimen: BLOOD  Result Value Ref Range Status   Specimen Description   Final    BLOOD LEFT WRIST Performed at [facility], [address] [place], [place] [address]    Special Requests   Final    BOTTLES DRAWN AEROBIC AND ANAEROBIC Blood Culture adequate volume Performed at [facility], [address] [place], [place] [address]    Culture   Final    NO GROWTH 2 DAYS Performed at [facility], [address], [place], [place] [address]    Report Status PENDING  Incomplete      Studies: No results found.  Scheduled Meds: . Chlorhexidine Gluconate Cloth  6 each Topical Daily  . folic acid  1 mg Intravenous Daily  . heparin  5,000 Units Subcutaneous Q8H  . insulin aspart  0-9 Units Subcutaneous Q6H  . ketorolac  15 mg Intravenous Q6H  . pantoprazole (PROTONIX) IV  40 mg Intravenous Q24H    Continuous Infusions: . ciprofloxacin 400 mg (01/16/20 1311)  . dextrose 5% lactated ringers 10 mL/hr at 01/15/20 2200  . methocarbamol (ROBAXIN) IV 500 mg (01/16/20 1044)  . metronidazole 500 mg (01/16/20 1421)  . [COMPLETED] potassium chloride 10 mEq (01/16/20 0752)  . TPN ADULT (ION) 90 mL/hr at 01/15/20 2200  . TPN ADULT (ION)       LOS: 5 days     Darlin Drop, MD [facility] Pager  [phone]  If 7PM-7AM, please contact night-coverage [email or web address] Password [facility] 01/16/2020, 4:25 PM

## 2020-01-16 NOTE — Progress Notes (Signed)
Bedside RN Fearn informed the writer of patient's agitation and pulling out her IV.  Due to concern for patient's safety, ordered a 1 to 1 sitter for patient's own safety.

## 2020-01-16 NOTE — Progress Notes (Signed)
[facility] Progress Note  4 Days Post-Op  Subjective: Patient continues to complain of abdominal pain and is hyper-focused on pain medication. Patient reports she wants to have no pain and that is why she came to the hospital, we discussed that this is an unrealistic goal 4 days out from surgery but we will continue to try to improve pain. Denies nausea. Having loose BMs.   Objective: Vital signs in last 24 hours: Temp:  [97.9 F (36.6 C)-98.9 F (37.2 C)] 97.9 F (36.6 C) (03/22 0506) Pulse Rate:  [81-96] 83 (03/22 0506) Resp:  [15-20] 18 (03/22 0506) BP: (116-161)/(57-65) 139/57 (03/22 0506) SpO2:  [100 %] 100 % (03/22 0506) Last BM Date: 01/02/20  Intake/Output from previous day: 03/21 0701 - 03/22 0700 In: 3163 [P.O.:240; I.V.:1982.7; IV Piggyback:940.3] Out: 2975 [Urine:775; Emesis/NG YBOFBP:1025] Intake/Output this shift: No intake/output data recorded.  PE: General: pleasant, cachectic female who is sitting in chair in NAD Heart: regular, rate, and rhythm.  Normal s1,s2. No obvious murmurs, gallops, or rubs noted.  Palpable radial and pedal pulses bilaterally Lungs: CTAB, no wheezes, rhonchi, or rales noted.  Respiratory effort nonlabored Abd: soft, appropriately ttp, ND, BS hypoactive, midline incision clean with 2 staples remaining MS: all 4 extremities are symmetrical with no cyanosis, clubbing, or edema. Skin: warm and dry with no masses, lesions, or rashes    Lab Results:  Recent Labs    01/15/20 0704 01/16/20 0347  WBC 9.5 7.2  HGB 8.7* 7.9*  HCT 26.8* 24.6*  PLT 95* 85*   BMET Recent Labs    01/15/20 0333 01/16/20 0347  NA 141 141  K 3.4* 3.5  CL 108 104  CO2 28 31  GLUCOSE 179* 106*  BUN 19 20  CREATININE 0.62 0.55  CALCIUM 8.2* 8.3*   PT/INR No results for input(s): LABPROT, INR in the last 72 hours. CMP     Component Value Date/Time   NA 141 01/16/2020 0347   K 3.5 01/16/2020 0347   CL 104 01/16/2020 0347   CO2 31  01/16/2020 0347   GLUCOSE 106 (H) 01/16/2020 0347   BUN 20 01/16/2020 0347   CREATININE 0.55 01/16/2020 0347   CALCIUM 8.3 (L) 01/16/2020 0347   PROT 4.3 (L) 01/16/2020 0347   ALBUMIN 1.8 (L) 01/16/2020 0347   AST 11 (L) 01/16/2020 0347   ALT 7 01/16/2020 0347   ALKPHOS 39 01/16/2020 0347   BILITOT 0.3 01/16/2020 0347   GFRNONAA >60 01/16/2020 0347   GFRAA >60 01/16/2020 0347   Lipase     Component Value Date/Time   LIPASE 21 01/11/2020 0728       Studies/Results: DG CHEST PORT 1 VIEW  Result Date: 01/14/2020 CLINICAL DATA:  hypoxia. Pt is 2 days post op for exploratory laparotomy lysis of adhesions and small bowel obstruction. EXAM: PORTABLE CHEST 1 VIEW COMPARISON:  Chest radiograph 01/11/2020 FINDINGS: Interval placement of a nasogastric tube with distal aspect below the diaphragm out of field of view. Interval placement of a right upper extremity PICC with tip projecting in the right atrium. The heart size and mediastinal contours are within normal limits. Minimal opacities at the bilateral lung bases likely reflect atelectasis. Lungs are otherwise clear. No pneumothorax or significant pleural effusion. No acute finding in the visualized skeleton. IMPRESSION: 1. Interval placement of a right upper extremity PICC with tip projecting in the right atrium. 2. Interval placement of a nasogastric tube with distal aspect below the diaphragm out of field of view. 3.  Minimal bibasilar atelectasis. Electronically Signed   By: Emmaline Kluver M.D.   On: 01/14/2020 13:48   DG Abd Portable 1V  Result Date: 01/14/2020 CLINICAL DATA:  Postoperative abdominal pain. EXAM: PORTABLE ABDOMEN - 1 VIEW COMPARISON:  January 12, 2020. FINDINGS: Distal tip of nasogastric tube is seen in the stomach. Status post cholecystectomy. No colonic dilatation is noted. Minimally distended small bowel loops are seen in the left side of the abdomen which may represent ileus. IMPRESSION: Distal tip of nasogastric tube  seen in stomach. Minimally distended small bowel loops are seen in left side of the abdomen which may represent ileus. Electronically Signed   By: Lupita Raider M.D.   On: 01/14/2020 13:55    Anti-infectives: Anti-infectives (From admission, onward)   Start     Dose/Rate Route Frequency Ordered Stop   01/14/20 1400  metroNIDAZOLE (FLAGYL) IVPB 500 mg     500 mg 100 mL/hr over 60 Minutes Intravenous Every 8 hours 01/14/20 1203     01/14/20 1300  ciprofloxacin (CIPRO) IVPB 400 mg     400 mg 200 mL/hr over 60 Minutes Intravenous Every 12 hours 01/14/20 1203     01/13/20 0600  cefoTEtan (CEFOTAN) 2 g in sodium chloride 0.9 % 100 mL IVPB  Status:  Discontinued     2 g 200 mL/hr over 30 Minutes Intravenous On call to O.R. 01/12/20 1749 01/12/20 1903   01/12/20 0600  cefoTEtan (CEFOTAN) 2 g in sodium chloride 0.9 % 100 mL IVPB     2 g 200 mL/hr over 30 Minutes Intravenous On call to O.R. 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 Luisa Hart - POD#4 - having diarrhea this AM - clamp NGT and allow clears today, if doing well this afternoon d/c NGT - continue scheduled tylenol and add scheduled robaxin, prn dilaudid - will add PO pain control if tolerating liquids later today - continue to encourage mobilization - BID dressing changes - will work on weaning off TPN when patient starts FLD  FEN: NGT clamped, CLD, IVF; TPN ID: cipro/flagyl 3/20>> DVT: SQ Heparin  Follow-up: TBD    LOS: 5 days    Wells Guiles , [facility] 01/16/2020, 9:30 AM Please see Amion for pager number during day hours 7:00am-4:30pm

## 2020-01-16 NOTE — Progress Notes (Signed)
PHARMACY - TOTAL PARENTERAL NUTRITION CONSULT NOTE   Indication: Prolonged ileus  Patient Measurements: Height: 6\' 1"  (185.4 cm) Weight: 122 lb 12.7 oz (55.7 kg) IBW/kg (Calculated) : 75.4 TPN AdjBW (KG): 72.6 Body mass index is 16.2 kg/m. Usual Weight: ~81 kg  Assessment: Pt admitted with abdominal pain/vomiting. Pt has extensive GI surgical history: lap chole 09/05/19; ERCP, sphincterotomy, stent placement with removal of sludge on 09/07/19 and developed gallbladder fossa abscess and had CT guided transgluteal drain placement. Recently admitted from 3/4 - 3/9 with SBO, improved on small bowel protocol and NGT placement prior to discharge. Pt presented back to ED on 3/18 with complaints of abdominal pain: CT shows bowel obstruction. Pt has had recent weight loss of 19 pounds since 09/06/20. Pharmacy consulted to initiate TPN.   Glucose / Insulin: No hx DM.  - on sSSI q6h (used 6 units/24 hours) -CBGs near goal at range 107 - 167.  (Goal <150) Electrolytes: K improved to 3.5 (goal >4), Mag improved to 2.1 (goal >2), Phos improved to WNL, all others WNL Renal: SCr 0.55 LFTs / TGs: LFTs WNL Prealbumin / albumin: Prealbumin decreasing,  18.8 > 11.6 > 8.1 (3/22) / Albumin decreasing, 1.8 (low) Intake / Output; MIVF: D5LR @ 10 mL/hr GI Imaging: 3/17: CT Abd: SBO, possible related to adhesion. Per surgery, marked improvement Surgeries / Procedures:  3/18: Ex lap with small bowel resection and lysis of adhesions  Central access: PICC ordered 3/19, anticipate placement this afternoon TPN start date: 3/19  Nutritional Goals (per RD recommendation on 3/19): kCal: 2175-2395, Protein: 108-120, Fluid: >/= 2.2 L/day Goal TPN rate is 90 mL/hr (provides 117 g of protein and 2263 kcals per day)  Current Nutrition: NPO, TPN  Plan:  Now: KCl 10 mEq IV x 3 runs  At 1800:  Continue TPN at goal rate of 90 mL/hr   Electrolytes in TPN: Standard concentrations; 38mEq/L of Na, 74mEq/L of Ca, 55mEq/L  of Mg.  Increase to 60 mEq/L of K, Continue increased Phos 25 mmol/L.  Cl:Ac ratio 1:1  Add trace elements, chromium to TPN daily; MVI on MWF only due to national backorder  Continue Sensitive q6h SSI and adjust as needed   Continue mIVF 10 mL/hr  Monitor TPN labs on Mon/Thurs  Bmet, Mag, Phos tomorrow AM.   4m PharmD, BCPS Pager: [phone] 01/16/2020 8:29 AM

## 2020-01-16 NOTE — Care Management Important Message (Signed)
Important Message  Patient Details IM Letter given to [name] SW Case Manager to present to the Patient Name: Samantha Gonzales MRN: [id number] Date of Birth: 05/17/1941   Medicare Important Message Given:  Yes     Caren Macadam 01/16/2020, 1:50 PM

## 2020-01-17 LAB — BASIC METABOLIC PANEL
Anion gap: 6 (ref 5–15)
BUN: 18 mg/dL (ref 8–23)
CO2: 30 mmol/L (ref 22–32)
Calcium: 8.2 mg/dL — ABNORMAL LOW (ref 8.9–10.3)
Chloride: 103 mmol/L (ref 98–111)
Creatinine, Ser: 0.44 mg/dL (ref 0.44–1.00)
GFR calc Af Amer: >60 mL/min (ref >60)
GFR calc non Af Amer: >60 mL/min (ref >60)
Glucose, Bld: 93 mg/dL (ref 70–99)
Potassium: 3.7 mmol/L (ref 3.5–5.1)
Sodium: 139 mmol/L (ref 135–145)

## 2020-01-17 LAB — CBC
HCT: 23.1 % — ABNORMAL LOW (ref 36.0–46.0)
Hemoglobin: 7.4 g/dL — ABNORMAL LOW (ref 12.0–15.0)
MCH: 34.3 pg — ABNORMAL HIGH (ref 26.0–34.0)
MCHC: 32 g/dL (ref 30.0–36.0)
MCV: 106.9 fL — ABNORMAL HIGH (ref 80.0–100.0)
Platelets: 76 10*3/uL — ABNORMAL LOW (ref 150–400)
RBC: 2.16 MIL/uL — ABNORMAL LOW (ref 3.87–5.11)
RDW: 14.1 % (ref 11.5–15.5)
WBC: 5.9 10*3/uL (ref 4.0–10.5)
nRBC: 0 % (ref 0.0–0.2)

## 2020-01-17 LAB — GLUCOSE, CAPILLARY
Glucose-Capillary: 119 mg/dL — ABNORMAL HIGH (ref 70–99)
Glucose-Capillary: 121 mg/dL — ABNORMAL HIGH (ref 70–99)
Glucose-Capillary: 129 mg/dL — ABNORMAL HIGH (ref 70–99)
Glucose-Capillary: 147 mg/dL — ABNORMAL HIGH (ref 70–99)

## 2020-01-17 LAB — PHOSPHORUS: Phosphorus: 2.8 mg/dL (ref 2.5–4.6)

## 2020-01-17 LAB — MAGNESIUM: Magnesium: 2 mg/dL (ref 1.7–2.4)

## 2020-01-17 MED ORDER — HYDROCODONE-ACETAMINOPHEN 7.5-325 MG PO TABS
1.0000 | ORAL_TABLET | ORAL | Status: DC | PRN
  Administered 2020-01-17 – 2020-01-18 (×3): 1 via ORAL
  Filled 2020-01-17 (×3): qty 1

## 2020-01-17 MED ORDER — ENSURE ENLIVE PO LIQD
237.0000 mL | Freq: Three times a day (TID) | ORAL | Status: DC
  Administered 2020-01-17 (×3): 237 mL via ORAL

## 2020-01-17 MED ORDER — METHOCARBAMOL 500 MG PO TABS
1000.0000 mg | ORAL_TABLET | Freq: Three times a day (TID) | ORAL | Status: DC
  Administered 2020-01-17 – 2020-01-23 (×18): 1000 mg via ORAL
  Filled 2020-01-17 (×18): qty 2

## 2020-01-17 MED ORDER — SODIUM CHLORIDE 0.9% FLUSH: 10.0000 mL | INTRAVENOUS | Status: DC | PRN

## 2020-01-17 MED ORDER — HYDROMORPHONE HCL 1 MG/ML IJ SOLN
1.0000 mg | INTRAMUSCULAR | Status: DC | PRN
  Administered 2020-01-17 – 2020-01-18 (×3): 1 mg via INTRAVENOUS
  Filled 2020-01-17 (×4): qty 1

## 2020-01-17 MED ORDER — HYDROMORPHONE HCL 1 MG/ML IJ SOLN
0.5000 mg | INTRAMUSCULAR | Status: DC | PRN
  Administered 2020-01-17 (×4): 0.5 mg via INTRAVENOUS
  Filled 2020-01-17 (×4): qty 0.5

## 2020-01-17 MED ORDER — TRAVASOL 10 % IV SOLN
INTRAVENOUS | Status: DC
  Filled 2020-01-17: qty 583.2

## 2020-01-17 MED ORDER — LORAZEPAM 2 MG/ML IJ SOLN
1.0000 mg | Freq: Once | INTRAMUSCULAR | Status: AC
  Administered 2020-01-17: 1 mg via INTRAVENOUS
  Filled 2020-01-17: qty 1

## 2020-01-17 MED ORDER — SODIUM CHLORIDE 0.9% FLUSH
10.0000 mL | Freq: Two times a day (BID) | INTRAVENOUS | Status: DC
  Administered 2020-01-18 – 2020-01-22 (×4): 10 mL

## 2020-01-17 MED ORDER — POTASSIUM CHLORIDE 10 MEQ/100ML IV SOLN
10.0000 meq | INTRAVENOUS | Status: AC
  Administered 2020-01-17 (×3): 10 meq via INTRAVENOUS
  Filled 2020-01-17 (×3): qty 100

## 2020-01-17 NOTE — Progress Notes (Signed)
Message to call daughter, [name]. Call attempted and no answer. Voicemail left. Lina Sar, RN

## 2020-01-17 NOTE — Progress Notes (Signed)
CSW reached out to the patient daughter Jareli Highland to discuss Home PT/OT options. Daughter reports the patient had home health PT/OT services in the past. She cannot recall the agency used at the time. Daughter would like for the patient to have home health again but has requested to discuss the idea with the patient before making a final decision. Daughter will follow up with CSW later today.   [name], [place], MSW Clinical Social Worker  [phone] 01/17/2020  10:10 AM

## 2020-01-17 NOTE — Progress Notes (Signed)
Patient yelling out; states she has unrelieved pain. Dr Gerrit Friends paged, as IV pain medication is not due. She refuses PO pain medication. Lina Sar, RN

## 2020-01-17 NOTE — Progress Notes (Addendum)
OT Cancellation Note  Patient Details Name: Samantha Gonzales MRN: [id number] DOB: 1941-04-05   Cancelled Treatment:    Reason Eval/Treat Not Completed: Pain limiting ability to participate. Pt reports that she is hurting too much and that no one believes her and that she also does not believe that the nurse had given her any pain medicine today. RN has given pt pain meds today and assured pt of that, however pt continued to refuse to participate in any mobility.   Margaretmary Eddy [facility] 01/17/2020, 12:41 PM

## 2020-01-17 NOTE — Progress Notes (Signed)
Consult PROGRESS NOTE  Samantha ULLERY SKA:[id number] DOB: 1941-03-30 DOA: 01/11/2020 PCP: System, Pcp Not In   Requesting physician: Charleston Ropes, MD Reason for consultation: Medical clearance for surgery, cardiac murmur           Impression   -Resoved Small bowel obstruction s/p exlap lysis of adhesions 3/18 Dr. Luisa Hart   -Acute thrombocytopenia in the setting of acute illness   -Murmur, heart   -Possible hypertrophic cardiomyopathy 2D echo 3/5.   -Essential HTN   -HLD   -Resolved AKI   -Resolved hypomagnesemia   -Resolved hypophosphatemia   -Resolved leukocytosis, neutrophilia   -Resolved tachycardia   -Improved elevated procalcitonin   -Cognitive impairment per her daughter   Recommendations: -Tolerating a diet, return of bowel function, management per Primary team -Platelet counts continue to trend down, suspect in the setting of acute illness. -Urine culture negative final.  Blood cultures negative to date, continue to follow cultures. -Elevated procalcitonin up to 43>> 15.  Repeat procalcitonin in the morning. -Received IV Cipro and IV Flagyl, DC'd -Potassium and phosphorus replaced, also on TPN.  -Magnesium at goal, 2.0. -Delirium- Telesitter in place.  Thank you for this consultation.  Our [facility] hospitalist team will follow the patient with you.   HPI/Recap of past 24 hours: Samantha Gonzales is an 79 y.o. female with a h/o HTN, HLD and mild Mitral regurgitation who presents to the hospital with abdominal pain and vomiting.   Lap Cholecystectomy for acute cholecystitis in 11/20  ERCP sphincterotomy with stenting of the ventral pancreatic duct on 09/07/19  Admitted from 12/31 - 10/31/19 for colitis   Admitted on 12/29/19 to the [facility] practice teaching service for SBO and  discharged on 01/03/20.  CT scan showing a SBO and we are asked for medical clearance due to her h/o a murmur.   3/23: Complaints of abdominal pain.  Pain management per primary  team..     Assessment/Plan: Principal Problem:   Small bowel obstruction (HCC) Active Problems:   Essential hypertension   Murmur, heart   SBO (small bowel obstruction) (HCC)     Objective: Vitals:   01/16/20 2057 01/17/20 0558 01/17/20 0933 01/17/20 1338  BP: 130/65 132/61  (!) 143/58  Pulse: 88 93  92  Resp: 18 16    Temp: 98.2 F (36.8 C) 98.4 F (36.9 C)  98.2 F (36.8 C)  TempSrc: Oral Oral  Oral  SpO2: 100% 100% 100% 100%  Weight:      Height:        Intake/Output Summary (Last 24 hours) at 01/17/2020 1512 Last data filed at 01/17/2020 1454 Gross per 24 hour  Intake 3536.92 ml  Output 800 ml  Net 2736.92 ml   Filed Weights   01/11/20 0317 01/13/20 1132  Weight: 72.6 kg 55.7 kg    Exam:  . General: 79 y.o. year-old female well-developed well-nourished in no acute distress.  Alert and interactive.   . Cardiovascular: Regular rate and rhythm no rubs or gallops.   Marland Kitchen Respiratory: Clear to auscultation no wheezes or rales.   . Abdomen: Dressing in place.  Bowel sounds present. . Musculoskeletal: No lower extremity edema bilaterally.  Data Reviewed: CBC: Recent Labs  Lab 01/11/20 0615 01/12/20 0626 01/14/20 0341 01/15/20 0704 01/16/20 0347 01/16/20 1621 01/17/20 0309  WBC 11.7*   < > 11.9* 9.5 7.2 7.8 5.9  NEUTROABS 10.7*  --  11.2*  --  6.3  --   --   HGB 13.7   < >  10.0* 8.7* 7.9* 8.0* 7.4*  HCT 40.7   < > 30.6* 26.8* 24.6* 24.5* 23.1*  MCV 103.8*   < > 106.3* 106.8* 106.0* 106.1* 106.9*  PLT 438*   < > 144* 95* 85* 84* 76*   < > = values in this interval not displayed.   Basic Metabolic Panel: Recent Labs  Lab 01/12/20 0626 01/13/20 0057 01/13/20 0939 01/14/20 0341 01/15/20 0333 01/16/20 0347 01/17/20 0309  NA  --  143  --  145 141 141 139  K  --  3.9  --  4.3 3.4* 3.5 3.7  CL  --  111  --  110 108 104 103  CO2  --  24  --  28 28 31 30   GLUCOSE  --  140*  --  148* 179* 106* 93  BUN  --  15  --  24* 19 20 18   CREATININE  --  1.32*   --  0.97 0.62 0.55 0.44  CALCIUM  --  8.5*  --  8.5* 8.2* 8.3* 8.2*  MG   < >  --  1.6* 2.1 1.8 2.1 2.0  PHOS  --   --  3.9 2.2* 2.3* 2.7 2.8   < > = values in this interval not displayed.   GFR: Estimated Creatinine Clearance: 52.6 mL/min (by C-G formula based on SCr of 0.44 mg/dL). Liver Function Tests: Recent Labs  Lab 01/11/20 0728 01/13/20 0057 01/14/20 0341 01/15/20 0333 01/16/20 0347  AST 21 14* 14* 13* 11*  ALT 12 10 10 8 7   ALKPHOS 81 42 49 45 39  BILITOT 0.5 0.3 0.5 0.4 0.3  PROT 6.5 4.6* 4.7* 4.4* 4.3*  ALBUMIN 3.5 2.6* 2.2* 1.8* 1.8*   Recent Labs  Lab 01/11/20 0728  LIPASE 21   No results for input(s): AMMONIA in the last 168 hours. Coagulation Profile: Recent Labs  Lab 01/12/20 0626  INR 1.0   Cardiac Enzymes: No results for input(s): CKTOTAL, CKMB, CKMBINDEX, TROPONINI in the last 168 hours. BNP (last 3 results) No results for input(s): PROBNP in the last 8760 hours. HbA1C: No results for input(s): HGBA1C in the last 72 hours. CBG: Recent Labs  Lab 01/16/20 1151 01/16/20 1659 01/16/20 2320 01/17/20 0604 01/17/20 1120  GLUCAP 134* 97 154* 129* 119*   Lipid Profile: Recent Labs    01/16/20 0347  TRIG 58   Thyroid Function Tests: No results for input(s): TSH, T4TOTAL, FREET4, T3FREE, THYROIDAB in the last 72 hours. Anemia Panel: No results for input(s): VITAMINB12, FOLATE, FERRITIN, TIBC, IRON, RETICCTPCT in the last 72 hours. Urine analysis:    Component Value Date/Time   COLORURINE YELLOW 01/11/2020 0746   APPEARANCEUR CLEAR 01/11/2020 0746   LABSPEC 1.021 01/11/2020 0746   PHURINE 5.0 01/11/2020 0746   GLUCOSEU NEGATIVE 01/11/2020 0746   HGBUR SMALL (A) 01/11/2020 0746   BILIRUBINUR NEGATIVE 01/11/2020 0746   KETONESUR 5 (A) 01/11/2020 0746   PROTEINUR 100 (A) 01/11/2020 0746   NITRITE NEGATIVE 01/11/2020 0746   LEUKOCYTESUR SMALL (A) 01/11/2020 0746   Sepsis Labs: @LABRCNTIP (procalcitonin:4,lacticidven:4)  ) Recent  Results (from the past 240 hour(s))  Respiratory Panel by RT PCR (Flu A&B, Covid) - Nasopharyngeal Swab     Status: None   Collection Time: 01/11/20  7:28 AM   Specimen: Nasopharyngeal Swab  Result Value Ref Range Status   SARS Coronavirus 2 by RT PCR NEGATIVE NEGATIVE Final    Comment: (NOTE) SARS-CoV-2 target nucleic acids are NOT DETECTED. The SARS-CoV-2 RNA is generally  detectable in upper respiratoy specimens during the acute phase of infection. The lowest concentration of SARS-CoV-2 viral copies this assay can detect is 131 copies/mL. A negative result does not preclude SARS-Cov-2 infection and should not be used as the sole basis for treatment or other patient management decisions. A negative result may occur with  improper specimen collection/handling, submission of specimen other than nasopharyngeal swab, presence of viral mutation(s) within the areas targeted by this assay, and inadequate number of viral copies (<131 copies/mL). A negative result must be combined with clinical observations, patient history, and epidemiological information. The expected result is Negative. Fact Sheet for Patients:  https://www.moore.com/ Fact Sheet for Healthcare Providers:  https://www.young.biz/ This test is not yet ap proved or cleared by the [place] FDA and  has been authorized for detection and/or diagnosis of SARS-CoV-2 by FDA under an Emergency Use Authorization (EUA). This EUA will remain  in effect (meaning this test can be used) for the duration of the COVID-19 declaration under Section 564(b)(1) of the Act, 21 U.S.C. section 360bbb-3(b)(1), unless the authorization is terminated or revoked sooner.    Influenza A by PCR NEGATIVE NEGATIVE Final   Influenza B by PCR NEGATIVE NEGATIVE Final    Comment: (NOTE) The Xpert Xpress SARS-CoV-2/FLU/RSV assay is intended as an aid in  the diagnosis of influenza from Nasopharyngeal swab specimens  and  should not be used as a sole basis for treatment. Nasal washings and  aspirates are unacceptable for Xpert Xpress SARS-CoV-2/FLU/RSV  testing. Fact Sheet for Patients: https://www.moore.com/ Fact Sheet for Healthcare Providers: https://www.young.biz/ This test is not yet approved or cleared by the [place] FDA and  has been authorized for detection and/or diagnosis of SARS-CoV-2 by  FDA under an Emergency Use Authorization (EUA). This EUA will remain  in effect (meaning this test can be used) for the duration of the  Covid-19 declaration under Section 564(b)(1) of the Act, 21  U.S.C. section 360bbb-3(b)(1), unless the authorization is  terminated or revoked. Performed at [facility], [address] [place], [place] [address]   Surgical pcr screen     Status: None   Collection Time: 01/12/20 11:20 AM   Specimen: Nasal Mucosa; Nasal Swab  Result Value Ref Range Status   MRSA, PCR NEGATIVE NEGATIVE Final   Staphylococcus aureus NEGATIVE NEGATIVE Final    Comment: (NOTE) The Xpert SA Assay (FDA approved for NASAL specimens in patients 63 years of age and older), is one component of a comprehensive surveillance program. It is not intended to diagnose infection nor to guide or monitor treatment. Performed at [facility], [address] [place], [place] [address]   Culture, Urine     Status: None   Collection Time: 01/14/20 11:50 AM   Specimen: Urine, Random  Result Value Ref Range Status   Specimen Description   Final    URINE, RANDOM Performed at [facility], [address], [place], [place] [address]    Special Requests   Final    NONE Performed at [facility], [address] [place], [place] [address]    Culture   Final    NO GROWTH Performed at [facility], [address], [place], [place] [address]    Report Status 01/16/2020 FINAL   Final  Culture, blood (routine x 2)     Status: None (Preliminary result)   Collection Time: 01/14/20 12:02 PM   Specimen: BLOOD  Result Value Ref Range Status  Specimen Description BLOOD  Final   Special Requests   Final    BOTTLES DRAWN AEROBIC AND ANAEROBIC Blood Culture adequate volume Performed at [facility] [address] [place], [place] [address]    Culture NO GROWTH 3 DAYS  Final   Report Status PENDING  Incomplete  Culture, blood (routine x 2)     Status: None (Preliminary result)   Collection Time: 01/14/20 12:10 PM   Specimen: BLOOD  Result Value Ref Range Status   Specimen Description BLOOD LEFT WRIST  Final   Special Requests   Final    BOTTLES DRAWN AEROBIC AND ANAEROBIC Blood Culture adequate volume Performed at [address] [place], [place] [address]    Culture NO GROWTH 3 DAYS  Final   Report Status PENDING  Incomplete      Studies: No results found.  Scheduled Meds: . Chlorhexidine Gluconate Cloth  6 each Topical Daily  . feeding supplement (ENSURE ENLIVE)  237 mL Oral TID BM  . folic acid  1 mg Intravenous Daily  . heparin  5,000 Units Subcutaneous Q8H  . insulin aspart  0-9 Units Subcutaneous Q6H  . ketorolac  15 mg Intravenous Q6H  . methocarbamol  1,000 mg Oral TID  . pantoprazole (PROTONIX) IV  40 mg Intravenous Q24H    Continuous Infusions: . dextrose 5% lactated ringers 10 mL/hr at 01/15/20 2200  . TPN ADULT (ION) 90 mL/hr at 01/16/20 1800  . TPN ADULT (ION)       LOS: 6 days     Kayleen Memos, MD [facility] Pager [phone]  If 7PM-7AM, please contact night-coverage [email or web address] Password [facility] 01/17/2020, 3:12 PM

## 2020-01-17 NOTE — Progress Notes (Signed)
[facility] Progress Note  5 Days Post-Op  Subjective: Patient tolerated CLD. No emesis overnight. Continues to complain of pain.    Objective: Vital signs in last 24 hours: Temp:  [98.2 F (36.8 C)-98.4 F (36.9 C)] 98.4 F (36.9 C) (03/23 0558) Pulse Rate:  [86-93] 93 (03/23 0558) Resp:  [16-18] 16 (03/23 0558) BP: (130-133)/(61-67) 132/61 (03/23 0558) SpO2:  [100 %] 100 % (03/23 0558) Last BM Date: 01/16/20  Intake/Output from previous day: 03/22 0701 - 03/23 0700 In: 3710.7 [P.O.:180; I.V.:1873.5; IV Piggyback:1657.2] Out: 800 [Urine:800] Intake/Output this shift: No intake/output data recorded.  PE: General: pleasant, cachectic, NAD Heart: regular, rate, and rhythm.  Normal s1,s2. No obvious murmurs, gallops, or rubs noted.  Palpable radial and pedal pulses bilaterally Lungs: CTAB, no wheezes, rhonchi, or rales noted.  Respiratory effort nonlabored Abd: soft, appropriately ttp, ND, BS hypoactive MS: all 4 extremities are symmetrical with no cyanosis, clubbing, or edema. Skin: warm and dry with no masses, lesions, or rashes   Lab Results:  Recent Labs    01/16/20 1621 01/17/20 0309  WBC 7.8 5.9  HGB 8.0* 7.4*  HCT 24.5* 23.1*  PLT 84* 76*   BMET Recent Labs    01/16/20 0347 01/17/20 0309  NA 141 139  K 3.5 3.7  CL 104 103  CO2 31 30  GLUCOSE 106* 93  BUN 20 18  CREATININE 0.55 0.44  CALCIUM 8.3* 8.2*   PT/INR No results for input(s): LABPROT, INR in the last 72 hours. CMP     Component Value Date/Time   NA 139 01/17/2020 0309   K 3.7 01/17/2020 0309   CL 103 01/17/2020 0309   CO2 30 01/17/2020 0309   GLUCOSE 93 01/17/2020 0309   BUN 18 01/17/2020 0309   CREATININE 0.44 01/17/2020 0309   CALCIUM 8.2 (L) 01/17/2020 0309   PROT 4.3 (L) 01/16/2020 0347   ALBUMIN 1.8 (L) 01/16/2020 0347   AST 11 (L) 01/16/2020 0347   ALT 7 01/16/2020 0347   ALKPHOS 39 01/16/2020 0347   BILITOT 0.3 01/16/2020 0347   GFRNONAA >60 01/17/2020 0309    GFRAA >60 01/17/2020 0309   Lipase     Component Value Date/Time   LIPASE 21 01/11/2020 0728       Studies/Results: No results found.  Anti-infectives: Anti-infectives (From admission, onward)   Start     Dose/Rate Route Frequency Ordered Stop   01/14/20 1400  metroNIDAZOLE (FLAGYL) IVPB 500 mg     500 mg 100 mL/hr over 60 Minutes Intravenous Every 8 hours 01/14/20 1203     01/14/20 1300  ciprofloxacin (CIPRO) IVPB 400 mg     400 mg 200 mL/hr over 60 Minutes Intravenous Every 12 hours 01/14/20 1203     01/13/20 0600  cefoTEtan (CEFOTAN) 2 g in sodium chloride 0.9 % 100 mL IVPB  Status:  Discontinued     2 g 200 mL/hr over 30 Minutes Intravenous On call to O.R. 01/12/20 1749 01/12/20 1903   01/12/20 0600  cefoTEtan (CEFOTAN) 2 g in sodium chloride 0.9 % 100 mL IVPB     2 g 200 mL/hr over 30 Minutes Intravenous On call to O.R. 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 Brantley Stage - POD#5 - tolerating CLD - advance to  FLD and 1/2 TPN - increase PO pain medication to q4h prn, decrease dilaudid to 0.5 mg q2h prn - continue to encourage mobilization - PT/OT - BID dressing changes - no leukocytosis or fever, will stop IV abx today  FEN: FLD, IVF; 1/2 TPN; ensure ID: cipro/flagyl 3/20>3/23 DVT: SQ Heparin  Follow-up: TBD  LOS: 6 days    Wells Guiles , [facility] 01/17/2020, 8:28 AM Please see Amion for pager number during day hours 7:00am-4:30pm

## 2020-01-17 NOTE — Progress Notes (Signed)
PHARMACY - PHYSICIAN COMMUNICATION CRITICAL VALUE ALERT - BLOOD CULTURE IDENTIFICATION (BCID)  SAMIKA VETSCH is an 79 y.o. female who presented to [facility] on 01/11/2020 with a chief complaint of SBO  Assessment:  GPR in 1/4 BCx bottles (anaerobe); source unknown but possibly contaminant given such late growth in anaerobic bottle from common contaminant group. Patient completed 4 days of Cipro/Flagyl today; demonstrated no fevers or leukocytosis, and appears otherwise clinically stable/improved per [facility] notes  Name of physician (or Provider) Contacted: Gerkin  Current antibiotics: none  Changes to prescribed antibiotics recommended: monitor off abx Recommendations accepted by provider  No results found for this or any previous visit.  Bruna Dills A 01/17/2020  9:10 PM

## 2020-01-17 NOTE — Progress Notes (Signed)
PHARMACY - TOTAL PARENTERAL NUTRITION CONSULT NOTE   Indication: Prolonged ileus  Patient Measurements: Height: 6\' 1"  (185.4 cm) Weight: 122 lb 12.7 oz (55.7 kg) IBW/kg (Calculated) : 75.4 TPN AdjBW (KG): 72.6 Body mass index is 16.2 kg/m. Usual Weight: ~81 kg  Assessment: Pt admitted with abdominal pain/vomiting. Pt has extensive GI surgical history: lap chole 09/05/19; ERCP, sphincterotomy, stent placement with removal of sludge on 09/07/19 and developed gallbladder fossa abscess and had CT guided transgluteal drain placement. Recently admitted from 3/4 - 3/9 with SBO, improved on small bowel protocol and NGT placement prior to discharge. Pt presented back to ED on 3/18 with complaints of abdominal pain: CT shows bowel obstruction. Pt has had recent weight loss of 19 pounds since 09/06/20. Pharmacy consulted to initiate TPN.   01/17/20 7:35 AM - NGT d/c  - plan to wean today per surgery  Glucose / Insulin: No hx DM.  - on sSSI q6h (used 4 units/24 hours) -CBGs near goal at range 97-154.  (Goal <150) Electrolytes: K improved to 3.7 (goal >4), Mag 2 (goal >2), Phos improved to WNL, all others WNL Renal: SCr 0.44 LFTs / TGs: LFTs WNL (3/22) Prealbumin / albumin: Prealbumin decreasing,  18.8 > 11.6 > 8.1 (3/22) / Albumin decreasing, 1.8 (low) Intake / Output; MIVF: D5LR @ 10 mL/hr GI Imaging: 3/17: CT Abd: SBO, possible related to adhesion. Per surgery, marked improvement Surgeries / Procedures:  3/18: Ex lap with small bowel resection and lysis of adhesions  Central access: PICC ordered 3/19, anticipate placement this afternoon TPN start date: 3/19  Nutritional Goals (per RD recommendation on 3/19): kCal: 2175-2395, Protein: 108-120, Fluid: >/= 2.2 L/day Goal TPN rate is 90 mL/hr (provides 117 g of protein and 2263 kcals per day)  Current Nutrition: NPO, TPN  Plan:  Now: KCl 10 mEq IV x 3 runs  At 1800:  Wean TPN to 45 ml/hr  Electrolytes in TPN: Standard  concentrations; 27mEq/L of Na, 61mEq/L of Ca, 81mEq/L of Mg. Continue increased 60 mEq/L of K, Continue increased Phos 25 mmol/L.  Cl:Ac ratio 1:1  Add trace elements, chromium to TPN daily; MVI on MWF only due to national backorder  Continue Sensitive q6h SSI and adjust as needed   Continue mIVF 10 mL/hr  Monitor TPN labs on Mon/Thurs  Bmet, Mag, Phos tomorrow AM.   4m, PharmD, BCPS 01/17/2020 7:33 AM

## 2020-01-17 NOTE — Evaluation (Signed)
Physical Therapy Evaluation Patient Details Name: Samantha Gonzales MRN: [id number] DOB: [birth year] Today's Date: 01/17/2020   History of Present Illness  Admitted with Small Bowel Obstruction and underwent Exploratory Lap on 01/12/2020- Had come in with abdominal pain and vomiting. Has had agitation including pulling IV out- PMH includes essential HTN, possible hypertrophic cardiomyopathy, HLD, AKI, hypomagnesemia, hypophosphateemia, cognitive impairment, and tachycardia.,  Clinical Impression  Patient resting supine in bed when PT arrived, having just received pain meds per RN. Patient very focused on her pain, and immediately said- "I am still hurting, and I do not know why the medication does not work". Admittedly, she said she had been hurting a lot but "thought it would get better, so did not tell the nurse right away". RN explained that the meds could take at least 30 minutes to take effect- but every few minutes, she kept saying "I am still hurting, why?". Presents with little insight into her medical situation, and reduced safety awareness. PT spoke with daughter by phone and was told the family (patient and several family members including daughter ) live in a one level home with 3 outside steps. Tub-shower combo, BS commode, standard height commode, grab bars, and RW. According to the daughter, she was able to bathe and dress self with supervision, and a few weeks prior to this admission she quit using her RW for ambulation. Daughter states the cognitive changes have been occurring over the last year. Patient is generally weak, flat affect, focused on pain, and even with max encouragement refused to perform any bed mobility. Instructed in LE ex format and reminded of the importance of being able to get out of bed to chair with PT and nursing to help heal form her surgery (had exploratory LAP on 01/12/2020). Should benefit from PT to further address optimal functional outcomes.     Follow Up  Recommendations Home health PT;Supervision/Assistance - 24 hour    Equipment Recommendations  None recommended by PT(Has RW at home)    Recommendations for Other Services       Precautions / Restrictions Precautions Precautions: Fall Precaution Comments: cognition Restrictions Weight Bearing Restrictions: No      Mobility  Bed Mobility Overal bed mobility: Needs Assistance(refused all efforts to encourage bed mobility, explained the importance of moving about - supine to sit on edge of bed, etc- but she said "I will hurt more and I am not going to do it")                Transfers patient refused to attempt                    Ambulation/Gait- could not assess this session as patient refused to attempt                Stairs            Wheelchair Mobility    Modified Rankin (Stroke Patients Only)       Balance Overall balance assessment: (Could not test in seated as she refused to do any bed mobility)                                           Pertinent Vitals/Pain Pain Assessment: 0-10 Pain Score: 7  Faces Pain Scale: Hurts even more Pain Location: stomach  Pain Descriptors / Indicators: Discomfort;Grimacing;Guarding Pain Intervention(s): Premedicated before session(She had  been medicated just prior to PT arriving- but she kept saying "I still am hurting but I waited to tell the nurse thinking it would get better")    Home Living Family/patient expects to be discharged to:: Private residence Living Arrangements: Children Available Help at Discharge: Family;Available 24 hours/day   Home Access: Stairs to enter Entrance Stairs-Rails: Right Entrance Stairs-Number of Steps: 3 Home Layout: One level Home Equipment: Walker - 2 wheels Additional Comments: Daughter states that she has RW, but that prior to this hospitalization, she was not using any AD. Daughter works out of home.    Prior Function Level of Independence:  Independent         Comments: Daughter indicates that she has 24/7 supervision from family members- indicates that the cognitive changes have occured over the last year.     Hand Dominance   Dominant Hand: Right    Extremity/Trunk Assessment        Lower Extremity Assessment Lower Extremity Assessment: Generalized weakness    Cervical / Trunk Assessment Cervical / Trunk Assessment: Normal  Communication   Communication: No difficulties  Cognition Arousal/Alertness: Awake/alert Behavior During Therapy: WFL for tasks assessed/performed Overall Cognitive Status: Impaired/Different from baseline(Daughter indicates that she has had cognitive changes over the last year, but with no agitation.) Area of Impairment: Safety/judgement;Awareness;Memory;Orientation;Attention;Problem solving;Following commands                 Orientation Level: Disoriented to;Time Current Attention Level: Selective Memory: Decreased short-term memory Following Commands: Follows one step commands consistently;Follows one step commands with increased time Safety/Judgement: Decreased awareness of deficits;Decreased awareness of safety   Problem Solving: Requires verbal cues;Requires tactile cues;Difficulty sequencing(Even with encouragement, she stayed very focused on her PAIN, and refused to do any bed mobility. States she had walked by herself to the bathroom- unlikely as she has IV, alarms and pads on the floor with handrails up on bed.) General Comments: She remained very focused on pain levels and refused to do any bed mobility even with max encouragement and reassurance.      General Comments      Exercises General Exercises - Lower Extremity Ankle Circles/Pumps: Supine;10 reps;Both;AROM;AAROM Quad Sets: AROM;AAROM;Both;10 reps;Supine Gluteal Sets: AROM;10 reps;Supine Short Arc Quad: AROM;AAROM;Supine;Both;10 reps Heel Slides: AROM;AAROM;Supine;10 reps;Both Hip ABduction/ADduction:  AROM;AAROM;Supine;Both;10 reps Other Exercises Other Exercises: Reminded to perform these ex in between sessions and discussed the need for PT to complete her assessment and care plan/goals   Assessment/Plan    PT Assessment Patient needs continued PT services  PT Problem List Decreased strength;Decreased mobility;Decreased activity tolerance;Decreased balance;Decreased cognition;Pain;Decreased safety awareness       PT Treatment Interventions Therapeutic activities;Gait training;Therapeutic exercise;Balance training;Stair training;Patient/family education;Functional mobility training    PT Goals (Current goals can be found in the Care Plan section)  Acute Rehab PT Goals Patient Stated Goal: go home PT Goal Formulation: With patient Time For Goal Achievement: 01/31/20 Potential to Achieve Goals: Good    Frequency Min 3X/week   Barriers to discharge        Co-evaluation               AM-PAC PT "6 Clicks" Mobility  Outcome Measure Help needed turning from your back to your side while in a flat bed without using bedrails?: A Little(best guess as she refused to do any bed mobility) Help needed moving from lying on your back to sitting on the side of a flat bed without using bedrails?: A Lot Help needed moving to and from  a bed to a chair (including a wheelchair)?: A Lot Help needed standing up from a chair using your arms (e.g., wheelchair or bedside chair)?: A Lot Help needed to walk in hospital room?: A Lot Help needed climbing 3-5 steps with a railing? : A Lot 6 Click Score: 13    End of Session   Activity Tolerance: Patient limited by fatigue;Patient limited by pain Patient left: in bed;with call bell/phone within reach;with bed alarm set Nurse Communication: (RN aware of limited participation. She is on a Oncologist currently.) PT Visit Diagnosis: Pain;Muscle weakness (generalized) (M62.81) Pain - Right/Left: (Abdomen)    Time: 6160-7371 PT Time Calculation  (min) (ACUTE ONLY): 35 min   Charges:   PT Evaluation $PT Eval Moderate Complexity: 1 Mod PT Treatments $Therapeutic Exercise: 8-22 mins        Rollen Sox, PT # [phone] CGV cell  Casandra Doffing 01/17/2020, 9:49 AM

## 2020-01-17 NOTE — Plan of Care (Signed)
  Problem: Clinical Measurements: Goal: Will remain free from infection Outcome: Progressing   Problem: Coping: Goal: Level of anxiety will decrease Outcome: Progressing   Problem: Elimination: Goal: Will not experience complications related to bowel motility Outcome: Progressing   Problem: Pain Managment: Goal: General experience of comfort will improve Outcome: Progressing   Problem: Safety: Goal: Ability to remain free from injury will improve Outcome: Progressing   

## 2020-01-18 LAB — GLUCOSE, CAPILLARY
Glucose-Capillary: 116 mg/dL — ABNORMAL HIGH (ref 70–99)
Glucose-Capillary: 119 mg/dL — ABNORMAL HIGH (ref 70–99)
Glucose-Capillary: 96 mg/dL (ref 70–99)

## 2020-01-18 LAB — CBC
HCT: 22 % — ABNORMAL LOW (ref 36.0–46.0)
Hemoglobin: 7.1 g/dL — ABNORMAL LOW (ref 12.0–15.0)
MCH: 34.5 pg — ABNORMAL HIGH (ref 26.0–34.0)
MCHC: 32.3 g/dL (ref 30.0–36.0)
MCV: 106.8 fL — ABNORMAL HIGH (ref 80.0–100.0)
Platelets: 107 10*3/uL — ABNORMAL LOW (ref 150–400)
RBC: 2.06 MIL/uL — ABNORMAL LOW (ref 3.87–5.11)
RDW: 14 % (ref 11.5–15.5)
WBC: 5.3 10*3/uL (ref 4.0–10.5)
nRBC: 0 % (ref 0.0–0.2)

## 2020-01-18 LAB — MAGNESIUM: Magnesium: 1.7 mg/dL (ref 1.7–2.4)

## 2020-01-18 LAB — BASIC METABOLIC PANEL
Anion gap: 4 — ABNORMAL LOW (ref 5–15)
BUN: 19 mg/dL (ref 8–23)
CO2: 27 mmol/L (ref 22–32)
Calcium: 8.4 mg/dL — ABNORMAL LOW (ref 8.9–10.3)
Chloride: 107 mmol/L (ref 98–111)
Creatinine, Ser: 0.46 mg/dL (ref 0.44–1.00)
GFR calc Af Amer: >60 mL/min (ref >60)
GFR calc non Af Amer: >60 mL/min (ref >60)
Glucose, Bld: 107 mg/dL — ABNORMAL HIGH (ref 70–99)
Potassium: 4.4 mmol/L (ref 3.5–5.1)
Sodium: 138 mmol/L (ref 135–145)

## 2020-01-18 LAB — HEMOGLOBIN AND HEMATOCRIT, BLOOD
HCT: 23.8 % — ABNORMAL LOW (ref 36.0–46.0)
Hemoglobin: 8 g/dL — ABNORMAL LOW (ref 12.0–15.0)

## 2020-01-18 LAB — PREPARE RBC (CROSSMATCH)

## 2020-01-18 LAB — PHOSPHORUS: Phosphorus: 2.5 mg/dL (ref 2.5–4.6)

## 2020-01-18 LAB — PROCALCITONIN: Procalcitonin: 4.88 ng/mL

## 2020-01-18 MED ORDER — HYDROCODONE-ACETAMINOPHEN 7.5-325 MG PO TABS
1.0000 | ORAL_TABLET | ORAL | Status: DC | PRN
  Administered 2020-01-18 – 2020-01-21 (×15): 2 via ORAL
  Administered 2020-01-21: 1 via ORAL
  Administered 2020-01-21: 2 via ORAL
  Administered 2020-01-21: 1 via ORAL
  Administered 2020-01-22 – 2020-01-23 (×8): 2 via ORAL
  Filled 2020-01-18 (×9): qty 2
  Filled 2020-01-18: qty 1
  Filled 2020-01-18 (×13): qty 2
  Filled 2020-01-18: qty 1
  Filled 2020-01-18 (×2): qty 2

## 2020-01-18 MED ORDER — MAGNESIUM SULFATE 2 GM/50ML IV SOLN
2.0000 g | Freq: Once | INTRAVENOUS | Status: AC
  Administered 2020-01-18: 2 g via INTRAVENOUS
  Filled 2020-01-18: qty 50

## 2020-01-18 MED ORDER — BOOST / RESOURCE BREEZE PO LIQD CUSTOM
1.0000 | Freq: Three times a day (TID) | ORAL | Status: DC
  Administered 2020-01-18 – 2020-01-23 (×13): 1 via ORAL

## 2020-01-18 MED ORDER — TRAVASOL 10 % IV SOLN
INTRAVENOUS | Status: AC
  Filled 2020-01-18: qty 259.2

## 2020-01-18 MED ORDER — PRO-STAT SUGAR FREE PO LIQD
30.0000 mL | Freq: Two times a day (BID) | ORAL | Status: DC
  Administered 2020-01-18 – 2020-01-23 (×11): 30 mL via ORAL
  Filled 2020-01-18 (×11): qty 30

## 2020-01-18 MED ORDER — PANTOPRAZOLE SODIUM 40 MG PO TBEC
40.0000 mg | DELAYED_RELEASE_TABLET | Freq: Every day | ORAL | Status: DC
  Administered 2020-01-19 – 2020-01-23 (×5): 40 mg via ORAL
  Filled 2020-01-18 (×5): qty 1

## 2020-01-18 MED ORDER — HYDROMORPHONE HCL 1 MG/ML IJ SOLN
1.0000 mg | INTRAMUSCULAR | Status: DC | PRN
  Administered 2020-01-18 – 2020-01-20 (×3): 1 mg via INTRAVENOUS
  Filled 2020-01-18 (×4): qty 1

## 2020-01-18 MED ORDER — SODIUM CHLORIDE 0.9% IV SOLUTION: Freq: Once | INTRAVENOUS | Status: AC

## 2020-01-18 MED ORDER — FOLIC ACID 1 MG PO TABS
1.0000 mg | ORAL_TABLET | Freq: Every day | ORAL | Status: DC
  Administered 2020-01-19 – 2020-01-23 (×5): 1 mg via ORAL
  Filled 2020-01-18 (×5): qty 1

## 2020-01-18 NOTE — TOC Initial Note (Signed)
Transition of Care [facility]) - Initial/Assessment Note    Patient Details  Name: Samantha Gonzales MRN: [id number] Date of Birth: 1941-01-02  Transition of Care [facility]) CM/SW Contact:    Lia Hopping, [place] Phone Number: 01/18/2020, 10:48 AM  Clinical Narrative:                 CSW met with the patient and her daughter at beside to discuss home health options.  Patient now agreeable to home health PT/OT/RN. It appears per chart review the patient has used [facility] in the past and prefers to use them again.  CSW confirm staff availability with Rep. Georgina Snell.   Daughter understands the patient will need assistance with her abdomen dressing changes BID. CSW explain the home health agency will not provide daily wound care, they will  Teach  Family how to care for the wound appropriately.  Daughter states, "I cannot stomach or look at wounds. I am trying to find someone now that can help with her dressing changes."  Expected Discharge Plan: [place] Barriers to Discharge: Continued Medical Work up   Patient Goals and CMS Choice Patient states their goals for this hospitalization and ongoing recovery are:: Return Home CMS Medicare.gov Compare Post Acute Care list provided to:: Patient Choice offered to / list presented to : Patient, Adult Children  Expected Discharge Plan and Services Expected Discharge Plan: [place]   Discharge Planning Services: CM Consult Post Acute Care Choice: [place] arrangements for the past 2 months: [place] Expected Discharge Date: (unknown)                         HH Arranged: PT, OT, RN [place] Agency: [place] Date [facility] Agency Contacted: 01/18/20 Time HH Agency Contacted: [phone] Representative spoke with at [place]: Georgina Snell  Prior Living Arrangements/Services Living arrangements for the past 2 months: [place] Lives with:: Adult Children Patient language and need for  interpreter reviewed:: No Do you feel safe going back to the place where you live?: Yes      Need for Family Participation in Patient Care: Yes (Comment) Care giver support system in place?: Yes (comment) Current home services: DME Criminal Activity/Legal Involvement Pertinent to Current Situation/Hospitalization: No - Comment as needed  Activities of Daily Living Home Assistive Devices/Equipment: Other (Comment)(shower/tub unit) ADL Screening (condition at time of admission) Patient's cognitive ability adequate to safely complete daily activities?: Yes Is the patient deaf or have difficulty hearing?: No Does the patient have difficulty seeing, even when wearing glasses/contacts?: No Does the patient have difficulty concentrating, remembering, or making decisions?: Yes Patient able to express need for assistance with ADLs?: No Does the patient have difficulty dressing or bathing?: Yes Independently performs ADLs?: Yes (appropriate for developmental age) Does the patient have difficulty walking or climbing stairs?: No Weakness of Legs: None Weakness of Arms/Hands: None  Permission Sought/Granted   Permission granted to share information with : Yes, Verbal Permission Granted  Share Information with NAME: Litrice  Permission granted to share info w AGENCY: [place] granted to share info w Relationship: Daughter  Permission granted to share info w Contact Information: [phone]  Emotional Assessment Appearance:: Appears stated age Attitude/Demeanor/Rapport: Engaged Affect (typically observed): Calm Orientation: : Oriented to Self, Oriented to Place, Oriented to  Time, Oriented to Situation Alcohol / Substance Use: Not Applicable Psych Involvement: No (comment)  Admission diagnosis:  Small bowel obstruction (HCC) [K56.609] SBO (small bowel obstruction) ([place]) [K56.609] Other ascites [R18.8] Macrocytosis without anemia [D75.89] Pre-op evaluation [Z01.818] Patient  Active Problem List   Diagnosis Date Noted  . Murmur, heart 01/11/2020  . SBO (small bowel obstruction) ([place]) 01/11/2020  . Small bowel obstruction ([place]) 12/29/2019  . Pressure injury of skin 10/29/2019  . Colitis 10/28/2019  . AKI (acute kidney injury) ([place]) 10/27/2019  . Syncope 10/27/2019  . Hypokalemia 10/27/2019  . Essential hypertension 10/27/2019  . Malnutrition of moderate degree 09/07/2019  . Cholecystitis 09/04/2019  . Altered mental status 01/18/2018  . Altered mental state 01/17/2018   PCP:  System, Pcp Not In Pharmacy:   [facility] [place], [place] - [address] [place] [address] Phone: [phone] Fax: [phone]     Social Determinants of Health (SDOH) Interventions    Readmission Risk Interventions No flowsheet data found.

## 2020-01-18 NOTE — Progress Notes (Signed)
[facility] Progress Note  6 Days Post-Op  Subjective: Patient had 11 BM in last 24 hrs. Unclear whether abdominal pain is related to BMs at all. Patient continues to complain of 10/10 abdominal pain chronically but is refusing PO medications. We again discussed transition to PO pain medications today. Her daughter was present in the room for this discussion. RN reports that night RN told her the patient was having more BMs with ensure - will stop this today.   Objective: Vital signs in last 24 hours: Temp:  [98.2 F (36.8 C)-99.4 F (37.4 C)] 98.9 F (37.2 C) (03/24 0623) Pulse Rate:  [92-102] 102 (03/24 0623) Resp:  [19-20] 19 (03/24 0623) BP: (119-143)/(50-58) 135/53 (03/24 0623) SpO2:  [98 %-100 %] 98 % (03/24 0623) Last BM Date: 01/17/20  Intake/Output from previous day: 03/23 0701 - 03/24 0700 In: 3465.4 [P.O.:1314; I.V.:1823.9; IV Piggyback:327.5] Out: -  Intake/Output this shift: Total I/O In: 60 [P.O.:60] Out: -   PE: General: pleasant,cachectic, NAD Heart: regular, rate, and rhythm. Normal s1,s2. No obvious murmurs, gallops, or rubs noted. Palpable radial and pedal pulses bilaterally Lungs: CTAB, no wheezes, rhonchi, or rales noted. Respiratory effort nonlabored Abd: soft,appropriately ttp, ND,BS hypoactive MS: all 4 extremities are symmetrical with no cyanosis, clubbing, or edema. Skin: warm and dry with no masses, lesions, or rashes   Lab Results:  Recent Labs    01/17/20 0309 01/18/20 0827  WBC 5.9 5.3  HGB 7.4* 7.1*  HCT 23.1* 22.0*  PLT 76* 107*   BMET Recent Labs    01/17/20 0309 01/18/20 0351  NA 139 138  K 3.7 4.4  CL 103 107  CO2 30 27  GLUCOSE 93 107*  BUN 18 19  CREATININE 0.44 0.46  CALCIUM 8.2* 8.4*   PT/INR No results for input(s): LABPROT, INR in the last 72 hours. CMP     Component Value Date/Time   NA 138 01/18/2020 0351   K 4.4 01/18/2020 0351   CL 107 01/18/2020 0351   CO2 27 01/18/2020 0351   GLUCOSE 107 (H) 01/18/2020 0351   BUN 19 01/18/2020 0351   CREATININE 0.46 01/18/2020 0351   CALCIUM 8.4 (L) 01/18/2020 0351   PROT 4.3 (L) 01/16/2020 0347   ALBUMIN 1.8 (L) 01/16/2020 0347   AST 11 (L) 01/16/2020 0347   ALT 7 01/16/2020 0347   ALKPHOS 39 01/16/2020 0347   BILITOT 0.3 01/16/2020 0347   GFRNONAA >60 01/18/2020 0351   GFRAA >60 01/18/2020 0351   Lipase     Component Value Date/Time   LIPASE 21 01/11/2020 0728       Studies/Results: No results found.  Anti-infectives: Anti-infectives (From admission, onward)   Start     Dose/Rate Route Frequency Ordered Stop   01/14/20 1400  metroNIDAZOLE (FLAGYL) IVPB 500 mg  Status:  Discontinued     500 mg 100 mL/hr over 60 Minutes Intravenous Every 8 hours 01/14/20 1203 01/17/20 0830   01/14/20 1300  ciprofloxacin (CIPRO) IVPB 400 mg  Status:  Discontinued     400 mg 200 mL/hr over 60 Minutes Intravenous Every 12 hours 01/14/20 1203 01/17/20 0830   01/13/20 0600  cefoTEtan (CEFOTAN) 2 g in sodium chloride 0.9 % 100 mL IVPB  Status:  Discontinued     2 g 200 mL/hr over 30 Minutes Intravenous On call to O.R. 01/12/20 1749 01/12/20 1903   01/12/20 0600  cefoTEtan (CEFOTAN) 2 g in sodium chloride 0.9 % 100 mL IVPB     2  g 200 mL/hr over 30 Minutes Intravenous On call to O.R. 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 Luisa Hart - POD#6 - advance to reg diet - encourage PO pain medication over IV - discussed this with her daughter present in the room today - continue to encourage mobilization - PT/OT - BID dressing changes - pt had 11 BMs in  last 24 hrs so checking C. Diff  FEN:reg diet, d/c TPN RJ:[id number] 3/20>3/23 [id number]  Follow-up: Dr. Luisa Hart  Patient likely medically ready for discharge tomorrow from a surgical standpoint if C. Diff negative and diarrhea improved.   LOS: 7 days    Wells Guiles , [facility] 01/18/2020, 9:20 AM Please see Amion for pager number during day hours 7:00am-4:30pm

## 2020-01-18 NOTE — Discharge Summary (Signed)
[facility] Discharge Summary   Patient ID: Samantha Gonzales MRN: [id number] DOB/AGE: [birth year] 77 y.o.  Admit date: 01/11/2020 Discharge date: 01/19/20   Admitting Diagnosis: Recurrent SBO Chronic abdominal pain HTN LVH Mitral valve murmur Aortic sclerosis HLD Acute on chronic anemia Severe protein calorie malnutrition  Discharge Diagnosis Recurrent SBO Chronic abdominal pain HTN LVH Mitral valve murmur Aortic sclerosis HLD Acute on chronic anemia Severe protein calorie malnutrition  Consultants Internal medicine   Imaging: No results found.  Procedures Dr. Brantley Stage (01/12/20) - Exploratory laparotomy with small bowel resection and lysis of adhesions  Hospital Course:  Patient is a 79 year old female who presented to [facility] with recurrent sbo.  Patient was admitted and underwent procedure listed above. Tolerated procedure well and was transferred to the floor. Patient developed mild ileus postoperatively but this improved and diet was advanced as tolerated. Patient was weaned off TPN with advancement in diet. Patient developed some diarrhea 3/23 but this was determined to be related to ensure and resolved when ensure was discontinued. Of note, post-operative course complicated by issues with pain control. Patient has been on chronic pain medication for chronic abdominal pain since 2019. Patient also refused to take PO pain medications at times and would ask specifically for IV pain medications. PT/OT evaluated patient and recommended home therapies. On POD7, the patient was voiding well, tolerating diet, ambulating well, pain reasonably well controlled, vital signs stable, wound without infection and felt stable for discharge home.  Patient will follow up in our office in 2-3 weeks and knows to call with questions or concerns. She will call to confirm appointment date/time.     Physical Exam: General: pleasant,cachectic,NAD Heart: regular, rate, and rhythm.  Normal s1,s2. No obvious murmurs, gallops, or rubs noted. Palpable radial and pedal pulses bilaterally Lungs: CTAB, no wheezes, rhonchi, or rales noted. Respiratory effort nonlabored Abd: soft,appropriately ttp, ND,BS hypoactive MS: all 4 extremities are symmetrical with no cyanosis, clubbing, or edema. Skin: warm and dry with no masses, lesions, or rashes  I have personally looked this patient up in the [place] Controlled Substance Database and reviewed their medications.  Allergies as of 01/19/2020      Reactions   Penicillins Hives, Itching, Rash   Has patient had a PCN reaction causing immediate rash, facial/tongue/throat swelling, SOB or lightheadedness with hypotension: Yes Has patient had a PCN reaction causing severe rash involving mucus membranes or skin necrosis: yes Has patient had a PCN reaction that required hospitalization: unk Has patient had a PCN reaction occurring within the last 10 years: unk If all of the above answers are "NO", then may proceed with Cephalosporin use.      Medication List    STOP taking these medications   polyethylene glycol 17 g packet Commonly known as: MIRALAX / GLYCOLAX   senna 8.6 MG Tabs tablet Commonly known as: SENOKOT     TAKE these medications   amLODipine 10 MG tablet Commonly known as: NORVASC Take 10 mg by mouth daily.   docusate sodium 100 MG capsule Commonly known as: Colace Take 1 capsule (100 mg total) by mouth 2 (two) times daily as needed for mild constipation.   ferrous sulfate 325 (65 FE) MG tablet Take 1 tablet (325 mg total) by mouth daily with breakfast. Start taking on: January 19, 2705   folic acid 1 MG tablet Commonly known as: FOLVITE Take 1 tablet (1 mg total) by mouth daily. Start taking on: January 20, 2020   HYDROcodone-acetaminophen 7.5-325 MG  tablet Commonly known as: NORCO Take 1-2 tablets by mouth every 4 (four) hours as needed for moderate pain or severe pain. What changed:   how much to  take  when to take this  reasons to take this   methocarbamol 500 MG tablet Commonly known as: ROBAXIN Take 2 tablets (1,000 mg total) by mouth 3 (three) times daily.   multivitamin with minerals Tabs tablet Take 1 tablet by mouth daily.   rosuvastatin 20 MG tablet Commonly known as: CRESTOR Take 20 mg by mouth daily.        Follow-up Information    Harriette Bouillon, MD. Go on 02/10/2020.   Specialty: General Surgery Why: Follow up appointment scheduled for 11:40 AM. Please arrive 30 min prior for check in. Bring photo ID and insurance information.  Contact information: [address] [place] [address] [phone]           Signed: Wells Guiles, [facility] 01/19/2020, 11:41 AM Please see Amion for pager number during day hours 7:00am-4:30pm

## 2020-01-18 NOTE — Progress Notes (Signed)
PROGRESS NOTE    Samantha Gonzales  ALP:[id number] DOB: 01-13-1941 DOA: 01/11/2020 PCP: System, Pcp Not In     Brief Narrative:  Samantha Gonzales is a 79 y.o. female with a history of HTN, HLD, mild mitral regurgitation who presented to the hospital with abdominal pain and vomiting. 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 Luisa Hart. TRH was consulted initially on 3/17 for medical clearance for surgery. Cardiology was also consulted on 3/21 as well.   New events last 24 hours / Subjective: Continues to complain of abdominal pain. Undergoing dressing change by surgery PA this morning.   Assessment & Plan:   Principal Problem:   Small bowel obstruction (HCC) Active Problems:   Essential hypertension   Murmur, heart   SBO (small bowel obstruction) (HCC)   Recurrent SBO s/p ex lap with LOA 3/18 -Per general surgery -On regular diet now, TPN  -Pain control   HTN -BP stable, 135/53 today   Hypokalemia, hypomagnesemia -Repleted and within acceptable range today   Macrocytic anemia -Baseline hgb in 2020 has been around 10-11  -Monitor and transfuse to maintain hgb > 7  -Folate low 4.8 --> replace  -Vit B12 normal 214   Thrombocytopenia -In setting of acute illness and surgery as above -Platelets improving   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 No high risk features. Recommended BP control. No other recommendations at this time     Antimicrobials:  Anti-infectives (From admission, onward)   Start     Dose/Rate Route Frequency Ordered Stop   01/14/20 1400  metroNIDAZOLE (FLAGYL) IVPB 500 mg  Status:  Discontinued     500 mg 100 mL/hr over 60 Minutes Intravenous Every 8 hours 01/14/20 1203 01/17/20 0830   01/14/20 1300  ciprofloxacin (CIPRO) IVPB 400 mg  Status:   Discontinued     400 mg 200 mL/hr over 60 Minutes Intravenous Every 12 hours 01/14/20 1203 01/17/20 0830   01/13/20 0600  cefoTEtan (CEFOTAN) 2 g in sodium chloride 0.9 % 100 mL IVPB  Status:  Discontinued     2 g 200 mL/hr over 30 Minutes Intravenous On call to O.R. 01/12/20 1749 01/12/20 1903   01/12/20 0600  cefoTEtan (CEFOTAN) 2 g in sodium chloride 0.9 % 100 mL IVPB     2 g 200 mL/hr over 30 Minutes Intravenous On call to O.R. 01/11/20 1022 01/12/20 1213        Objective: Vitals:   01/17/20 0933 01/17/20 1338 01/17/20 2144 01/18/20 0623  BP:  (!) 143/58 (!) 119/50 (!) 135/53  Pulse:  92 100 (!) 102  Resp:   20 19  Temp:  98.2 F (36.8 C) 99.4 F (37.4 C) 98.9 F (37.2 C)  TempSrc:  Oral Oral Oral  SpO2: 100% 100% 100% 98%  Weight:      Height:        Intake/Output Summary (Last 24 hours) at 01/18/2020 1159 Last data filed at 01/18/2020 1000 Gross per 24 hour  Intake 3004.15 ml  Output -  Net 3004.15 ml   Filed Weights   01/11/20 0317 01/13/20 1132  Weight: 72.6 kg 55.7 kg    Examination:  General exam: Appears calm, uncomfortable during dressing change   Respiratory system: Clear to auscultation. Respiratory effort normal. No respiratory distress. No conversational dyspnea.  Cardiovascular system:  S1 & S2 heard, RRR. No pedal edema. Gastrointestinal system: Abdomen is nondistended, soft. Midline incision  Central nervous system: Alert. No focal neurological deficits. Extremities: Symmetric in appearance    Data Reviewed: I have personally reviewed following labs and imaging studies  CBC: Recent Labs  Lab 01/14/20 0341 01/14/20 0341 01/15/20 0704 01/16/20 0347 01/16/20 1621 01/17/20 0309 01/18/20 0827  WBC 11.9*   < > 9.5 7.2 7.8 5.9 5.3  NEUTROABS 11.2*  --   --  6.3  --   --   --   HGB 10.0*   < > 8.7* 7.9* 8.0* 7.4* 7.1*  HCT 30.6*   < > 26.8* 24.6* 24.5* 23.1* 22.0*  MCV 106.3*   < > 106.8* 106.0* 106.1* 106.9* 106.8*  PLT 144*   < > 95* 85*  84* 76* 107*   < > = values in this interval not displayed.   Basic Metabolic Panel: Recent Labs  Lab 01/14/20 0341 01/15/20 0333 01/16/20 0347 01/17/20 0309 01/18/20 0351  NA 145 141 141 139 138  K 4.3 3.4* 3.5 3.7 4.4  CL 110 108 104 103 107  CO2 28 28 31 30 27   GLUCOSE 148* 179* 106* 93 107*  BUN 24* 19 20 18 19   CREATININE 0.97 0.62 0.55 0.44 0.46  CALCIUM 8.5* 8.2* 8.3* 8.2* 8.4*  MG 2.1 1.8 2.1 2.0 1.7  PHOS 2.2* 2.3* 2.7 2.8 2.5   GFR: Estimated Creatinine Clearance: 52.6 mL/min (by C-G formula based on SCr of 0.46 mg/dL). Liver Function Tests: Recent Labs  Lab 01/13/20 0057 01/14/20 0341 01/15/20 0333 01/16/20 0347  AST 14* 14* 13* 11*  ALT 10 10 8 7   ALKPHOS 42 49 45 39  BILITOT 0.3 0.5 0.4 0.3  PROT 4.6* 4.7* 4.4* 4.3*  ALBUMIN 2.6* 2.2* 1.8* 1.8*   No results for input(s): LIPASE, AMYLASE in the last 168 hours. No results for input(s): AMMONIA in the last 168 hours. Coagulation Profile: Recent Labs  Lab 01/12/20 0626  INR 1.0   Cardiac Enzymes: No results for input(s): CKTOTAL, CKMB, CKMBINDEX, TROPONINI in the last 168 hours. BNP (last 3 results) No results for input(s): PROBNP in the last 8760 hours. HbA1C: No results for input(s): HGBA1C in the last 72 hours. CBG: Recent Labs  Lab 01/17/20 1120 01/17/20 1744 01/17/20 2355 01/18/20 0621 01/18/20 1147  GLUCAP 119* 121* 147* 116* 119*   Lipid Profile: Recent Labs    01/16/20 0347  TRIG 58   Thyroid Function Tests: No results for input(s): TSH, T4TOTAL, FREET4, T3FREE, THYROIDAB in the last 72 hours. Anemia Panel: No results for input(s): VITAMINB12, FOLATE, FERRITIN, TIBC, IRON, RETICCTPCT in the last 72 hours. Sepsis Labs: Recent Labs  Lab 01/14/20 1045 01/16/20 0347 01/18/20 0351  PROCALCITON 43.36 15.91 4.88  LATICACIDVEN 0.9  --   --     Recent Results (from the past 240 hour(s))  Respiratory Panel by RT PCR (Flu A&B, Covid) - Nasopharyngeal Swab     Status: None    Collection Time: 01/11/20  7:28 AM   Specimen: Nasopharyngeal Swab  Result Value Ref Range Status   SARS Coronavirus 2 by RT PCR NEGATIVE NEGATIVE Final    Comment: (NOTE) SARS-CoV-2 target nucleic acids are NOT DETECTED. The SARS-CoV-2 RNA is generally detectable in upper respiratoy specimens during the acute phase of infection. The lowest concentration of SARS-CoV-2 viral copies this assay can detect is 131 copies/mL. A negative result does not preclude SARS-Cov-2 infection and should not be used as  the sole basis for treatment or other patient management decisions. A negative result may occur with  improper specimen collection/handling, submission of specimen other than nasopharyngeal swab, presence of viral mutation(s) within the areas targeted by this assay, and inadequate number of viral copies (<131 copies/mL). A negative result must be combined with clinical observations, patient history, and epidemiological information. The expected result is Negative. Fact Sheet for Patients:  https://www.moore.com/ Fact Sheet for Healthcare Providers:  https://www.young.biz/ This test is not yet ap proved or cleared by the [place] FDA and  has been authorized for detection and/or diagnosis of SARS-CoV-2 by FDA under an Emergency Use Authorization (EUA). This EUA will remain  in effect (meaning this test can be used) for the duration of the COVID-19 declaration under Section 564(b)(1) of the Act, 21 U.S.C. section 360bbb-3(b)(1), unless the authorization is terminated or revoked sooner.    Influenza A by PCR NEGATIVE NEGATIVE Final   Influenza B by PCR NEGATIVE NEGATIVE Final    Comment: (NOTE) The Xpert Xpress SARS-CoV-2/FLU/RSV assay is intended as an aid in  the diagnosis of influenza from Nasopharyngeal swab specimens and  should not be used as a sole basis for treatment. Nasal washings and  aspirates are unacceptable for Xpert Xpress  SARS-CoV-2/FLU/RSV  testing. Fact Sheet for Patients: https://www.moore.com/ Fact Sheet for Healthcare Providers: https://www.young.biz/ This test is not yet approved or cleared by the [place] FDA and  has been authorized for detection and/or diagnosis of SARS-CoV-2 by  FDA under an Emergency Use Authorization (EUA). This EUA will remain  in effect (meaning this test can be used) for the duration of the  Covid-19 declaration under Section 564(b)(1) of the Act, 21  U.S.C. section 360bbb-3(b)(1), unless the authorization is  terminated or revoked. Performed at [facility], [address] [place], [place] [address]   Surgical pcr screen     Status: None   Collection Time: 01/12/20 11:20 AM   Specimen: Nasal Mucosa; Nasal Swab  Result Value Ref Range Status   MRSA, PCR NEGATIVE NEGATIVE Final   Staphylococcus aureus NEGATIVE NEGATIVE Final    Comment: (NOTE) The Xpert SA Assay (FDA approved for NASAL specimens in patients 78 years of age and older), is one component of a comprehensive surveillance program. It is not intended to diagnose infection nor to guide or monitor treatment. Performed at [facility], [address], [place], [place] [address]   Culture, Urine     Status: None   Collection Time: 01/14/20 11:50 AM   Specimen: Urine, Random  Result Value Ref Range Status   Specimen Description   Final    URINE, RANDOM Performed at [facility], [address] [place], [place] [address]    Special Requests   Final    NONE Performed at [facility], [address] [place], [place] [address]    Culture   Final    NO GROWTH Performed at [facility], [address], [place], [place] [address]    Report Status 01/16/2020 FINAL  Final  Culture, blood (routine x 2)     Status: None (Preliminary result)   Collection Time: 01/14/20 12:02 PM    Specimen: BLOOD  Result Value Ref Range Status   Specimen Description   Final    BLOOD Performed at [facility], [address] [place], [place] [address]    Special Requests   Final    BOTTLES DRAWN AEROBIC AND  ANAEROBIC Blood Culture adequate volume Performed at [place] [address], [place], [place] [address]    Culture  Setup Time   Final    ANAEROBIC BOTTLE ONLY GRAM POSITIVE RODS CRITICAL RESULT CALLED TO, READ BACK BY AND VERIFIED WITHKeturah Barre [facility] PHARMD 01/17/20 JDW Performed at [facility], [place] [address] [place], [place] [address]    Culture NO GROWTH 4 DAYS  Final   Report Status PENDING  Incomplete  Culture, blood (routine x 2)     Status: None (Preliminary result)   Collection Time: 01/14/20 12:10 PM   Specimen: BLOOD  Result Value Ref Range Status   Specimen Description   Final    BLOOD LEFT WRIST Performed at [facility] [address], [place], [place] [address]    Special Requests   Final    BOTTLES DRAWN AEROBIC AND ANAEROBIC Blood Culture adequate volume Performed at [facility] [address] [place], [place] [address]    Culture   Final    NO GROWTH 4 DAYS Performed at [facility], [place] [address], [place], [place] [address]    Report Status PENDING  Incomplete      Radiology Studies: No results found.    Scheduled Meds: . sodium chloride   Intravenous Once  . Chlorhexidine Gluconate Cloth  6 each Topical Daily  . feeding supplement  1 Container Oral TID BM  . feeding supplement (PRO-STAT SUGAR FREE 64)  30 mL Oral BID  . folic acid  1 mg Intravenous Daily  . heparin  5,000 Units Subcutaneous Q8H  . insulin aspart  0-9 Units Subcutaneous Q6H  . ketorolac  15 mg Intravenous Q6H  . methocarbamol  1,000 mg Oral TID  . pantoprazole (PROTONIX) IV  40 mg Intravenous Q24H  . sodium chloride flush  10-40 mL Intracatheter Q12H   Continuous  Infusions: . TPN ADULT (ION) 45 mL/hr at 01/18/20 0609     LOS: 7 days      Time spent: 25 minutes   Dessa Phi, DO [facility] 01/18/2020, 11:59 AM   Available via Epic secure chat 7am-7pm After these hours, please refer to coverage provider listed on [email or web address]

## 2020-01-18 NOTE — Progress Notes (Signed)
Occupational Therapy Evaluation Patient Details Name: Samantha Gonzales MRN: [id number] DOB: October 22, 1941 Today's Date: 01/18/2020    History of Present Illness Admitted with Small Bowel Obstruction and underwent Exploratory Lap on 01/12/2020- Had come in with abdominal pain and vomiting. Has had agitation including pulling IV out- PMH includes essential HTN, possible hypertrophic cardiomyopathy, HLD, AKI, hypomagnesemia, hypophosphateemia, cognitive impairment, and tachycardia.,   Clinical Impression   PTA  Pt reports living with family and receiving assistance with self-care and functional mobility with walker. Pt's daughter was present during evaluation and attempted to encourage mother to perform bed mobility and sit in recliner. Pt declined to perform any functional mobility except for rolling side to side for use of bed pan. Pt educated on the importance of mobility, but declined due to increased abdominal pain. Pt was pre-medicated prior to start of therapy session. Pt required total assist with toileting needs. If family is not able to provide 24 hour physical assistance with ADL tasks, then SNF will be the appropriate recommendation.     Follow Up Recommendations  Home health OT;Supervision/Assistance - 24 hour    Equipment Recommendations  None recommended by OT    Recommendations for Other Services       Precautions / Restrictions Precautions Precautions: Fall Restrictions Weight Bearing Restrictions: No      Mobility Bed Mobility Overal bed mobility: Needs Assistance Bed Mobility: Rolling Rolling: Min assist(using bedrails )            Transfers Overall transfer level: Needs assistance Equipment used: Rolling walker (2 wheeled);None             General transfer comment: min assist to steady upon standing, pt reaching out for PT upon initial stand. STS x2, from EOB and from toilet.    Balance Overall balance assessment: Needs assistance                                          ADL either performed or assessed with clinical judgement   ADL Overall ADL's : Needs assistance/impaired Eating/Feeding: Set up   Grooming: Oral care;Wash/dry face;Wash/dry hands;Set up   Upper Body Bathing: Minimal assistance   Lower Body Bathing: Maximal assistance   Upper Body Dressing : Minimal assistance   Lower Body Dressing: Maximal assistance   Toilet Transfer: Moderate assistance   Toileting- Clothing Manipulation and Hygiene: Total assistance   Tub/ Shower Transfer: Moderate assistance     General ADL Comments: pt declined OOB transfer to [facility] or transfer to recliner. Nursing staff reports pt required 1 person for [facility]      Vision Baseline Vision/History: No visual deficits       Perception     Praxis      Pertinent Vitals/Pain Pain Assessment: 0-10 Pain Score: 8  Faces Pain Scale: Hurts whole lot Pain Location: stomach  Pain Descriptors / Indicators: Discomfort;Grimacing;Guarding Pain Intervention(s): Premedicated before session;Limited activity within patient's tolerance     Hand Dominance Right   Extremity/Trunk Assessment Upper Extremity Assessment Upper Extremity Assessment: RUE deficits/detail;LUE deficits/detail RUE: (Grossly 3+/5, AROM WFL ) LUE: (Grossly 3+/5, AROM WFL)   Lower Extremity Assessment Lower Extremity Assessment: Defer to PT evaluation       Communication Communication Communication: No difficulties   Cognition Arousal/Alertness: Awake/alert Behavior During Therapy: WFL for tasks assessed/performed Overall Cognitive Status: Impaired/Different from baseline Area of Impairment: Safety/judgement;Awareness;Memory;Orientation;Attention;Problem solving;Following commands  Orientation Level: Disoriented to;Time Current Attention Level: Selective Memory: Decreased short-term memory Following Commands: Follows one step commands consistently;Follows one step commands with  increased time Safety/Judgement: Decreased awareness of deficits;Decreased awareness of safety Awareness: Emergent Problem Solving: Requires verbal cues;Requires tactile cues;Difficulty sequencing     General Comments       Exercises     Shoulder Instructions      Home Living Family/patient expects to be discharged to:: Private residence Living Arrangements: Children Available Help at Discharge: Family;Available 24 hours/day Type of Home: House Home Access: Stairs to enter [employer] of Steps: 3 Entrance Stairs-Rails: Right Home Layout: One level     Bathroom Shower/Tub: Chief Strategy Officer: Standard Bathroom Accessibility: Yes How Accessible: Accessible via walker Home Equipment: Walker - 2 wheels;Bedside commode          Prior Functioning/Environment Level of Independence: Independent        Comments: Daughter indicates that she has 24/7 supervision from family members- indicates that the cognitive changes have occured over the last year.        OT Problem List: Decreased strength;Decreased activity tolerance;Impaired balance (sitting and/or standing);Pain;Decreased knowledge of precautions;Decreased knowledge of use of DME or AE;Decreased safety awareness;Decreased cognition      OT Treatment/Interventions: Self-care/ADL training;Energy conservation;DME and/or AE instruction;Therapeutic activities;Cognitive remediation/compensation;Patient/family education;Balance training    OT Goals(Current goals can be found in the care plan section) Acute Rehab OT Goals Patient Stated Goal: to go home  OT Goal Formulation: With patient Time For Goal Achievement: 02/01/20 Potential to Achieve Goals: Fair ADL Goals Pt Will Perform Grooming: sitting;with set-up Pt Will Transfer to Toilet: bedside commode;with min guard assist Pt Will Perform Toileting - Clothing Manipulation and hygiene: with min guard assist Pt/caregiver will Perform Home  Exercise Program: Increased strength;Both right and left upper extremity;With written HEP provided;With Supervision  OT Frequency: Min 2X/week   Barriers to D/C:            Co-evaluation              AM-PAC OT "6 Clicks" Daily Activity     Outcome Measure Help from another person eating meals?: A Little Help from another person taking care of personal grooming?: A Little Help from another person toileting, which includes using toliet, bedpan, or urinal?: Total Help from another person bathing (including washing, rinsing, drying)?: A Lot Help from another person to put on and taking off regular upper body clothing?: A Little Help from another person to put on and taking off regular lower body clothing?: A Lot 6 Click Score: 14   End of Session Nurse Communication: Mobility status;Patient requests pain meds  Activity Tolerance: Patient limited by pain Patient left: in bed;with call bell/phone within reach;with bed alarm set;with family/visitor present  OT Visit Diagnosis: Unsteadiness on feet (R26.81);Muscle weakness (generalized) (M62.81)                Time: 9528-4132 OT Time Calculation (min): 21 min Charges:  OT General Charges $OT Visit: 1 Visit  Vanassa Penniman OTR/L   Kaleiah Kutzer 01/18/2020, 1:43 PM

## 2020-01-18 NOTE — Progress Notes (Addendum)
PHARMACY - TOTAL PARENTERAL NUTRITION CONSULT NOTE   Indication: Prolonged ileus  Patient Measurements: Height: 6\' 1"  (185.4 cm) Weight: 122 lb 12.7 oz (55.7 kg) IBW/kg (Calculated) : 75.4 TPN AdjBW (KG): 72.6 Body mass index is 16.2 kg/m. Usual Weight: ~81 kg  Assessment: Pt admitted with abdominal pain/vomiting. Pt has extensive GI surgical history: lap chole 09/05/19; ERCP, sphincterotomy, stent placement with removal of sludge on 09/07/19 and developed gallbladder fossa abscess and had CT guided transgluteal drain placement. Recently admitted from 3/4 - 3/9 with SBO, improved on small bowel protocol and NGT placement prior to discharge. Pt presented back to ED on 3/18 with complaints of abdominal pain: CT shows bowel obstruction. Pt has had recent weight loss of 19 pounds since 09/06/20. Pharmacy consulted to initiate TPN.   Glucose / Insulin: No hx DM.  - on sSSI q6h (used 1 unit/24 hours) -CBGs at goal < 150 Electrolytes: K+ improved to 4.4 (goal >4), Mag 1.7 (goal > 2), all others WNL Renal: SCr 0.46 LFTs / TGs: LFTs WNL (3/22) Prealbumin / albumin: Prealbumin decreasing,  18.8 > 11.6 > 8.1 (3/22) / Albumin decreasing, 1.8 (low) MIVF: D5LR d/c'ed today  GI Imaging: 3/17: CT Abd: SBO, possible related to adhesion. Per surgery, marked improvement Surgeries / Procedures:  3/18: Ex lap with small bowel resection and lysis of adhesions  Central access: PICC ordered 3/19 TPN start date: 3/19  Nutritional Goals (per RD recommendation on 3/19): kCal: 2175-2395, Protein: 108-120, Fluid: >/= 2.2 L/day Goal TPN rate is 90 mL/hr (provides 117 g of protein and 2263 kcals per day)  Current Nutrition: TPN at 1/2 rate, Boost/Resource Breeze TID, Pro-stat BID (Ensure stopped due to concern for resulting in several BMs)  Plan:   Magnesium Sulfate 2g IV x 1   Decrease TPN rate to 65ml/hr, then stop TPN after current bag runs out at 1800 today per Surgery's orders.   Stop SSI at 1800  today   D/C TPN labs  Pharmacy signing off.    30m, PharmD, BCPS Clinical Pharmacist  01/18/2020 11:51 AM

## 2020-01-19 DIAGNOSIS — E43 Unspecified severe protein-calorie malnutrition: Secondary | ICD-10-CM | POA: Insufficient documentation

## 2020-01-19 LAB — BASIC METABOLIC PANEL
Anion gap: 6 (ref 5–15)
BUN: 18 mg/dL (ref 8–23)
CO2: 26 mmol/L (ref 22–32)
Calcium: 8.3 mg/dL — ABNORMAL LOW (ref 8.9–10.3)
Chloride: 105 mmol/L (ref 98–111)
Creatinine, Ser: 0.49 mg/dL (ref 0.44–1.00)
GFR calc Af Amer: >60 mL/min (ref >60)
GFR calc non Af Amer: >60 mL/min (ref >60)
Glucose, Bld: 77 mg/dL (ref 70–99)
Potassium: 4.5 mmol/L (ref 3.5–5.1)
Sodium: 137 mmol/L (ref 135–145)

## 2020-01-19 LAB — CBC
HCT: 22.4 % — ABNORMAL LOW (ref 36.0–46.0)
Hemoglobin: 7.4 g/dL — ABNORMAL LOW (ref 12.0–15.0)
MCH: 34.4 pg — ABNORMAL HIGH (ref 26.0–34.0)
MCHC: 33 g/dL (ref 30.0–36.0)
MCV: 104.2 fL — ABNORMAL HIGH (ref 80.0–100.0)
Platelets: 112 10*3/uL — ABNORMAL LOW (ref 150–400)
RBC: 2.15 MIL/uL — ABNORMAL LOW (ref 3.87–5.11)
RDW: 15.5 % (ref 11.5–15.5)
WBC: 6.4 10*3/uL (ref 4.0–10.5)
nRBC: 0 % (ref 0.0–0.2)

## 2020-01-19 LAB — MAGNESIUM: Magnesium: 1.9 mg/dL (ref 1.7–2.4)

## 2020-01-19 LAB — PREALBUMIN: Prealbumin: 15.5 mg/dL — ABNORMAL LOW (ref 18–38)

## 2020-01-19 MED ORDER — METRONIDAZOLE 500 MG PO TABS
500.0000 mg | ORAL_TABLET | Freq: Three times a day (TID) | ORAL | Status: DC
  Administered 2020-01-19 – 2020-01-23 (×12): 500 mg via ORAL
  Filled 2020-01-19 (×12): qty 1

## 2020-01-19 MED ORDER — FERROUS SULFATE 325 (65 FE) MG PO TABS
325.0000 mg | ORAL_TABLET | Freq: Every day | ORAL | Status: DC
  Administered 2020-01-20 – 2020-01-23 (×4): 325 mg via ORAL
  Filled 2020-01-19 (×4): qty 1

## 2020-01-19 MED ORDER — DOCUSATE SODIUM 100 MG PO CAPS: 100.0000 mg | ORAL_CAPSULE | Freq: Two times a day (BID) | ORAL | Status: DC | PRN

## 2020-01-19 MED ORDER — CEFUROXIME AXETIL 500 MG PO TABS
500.0000 mg | ORAL_TABLET | Freq: Two times a day (BID) | ORAL | Status: DC
  Administered 2020-01-19 – 2020-01-20 (×2): 500 mg via ORAL
  Filled 2020-01-19 (×3): qty 1

## 2020-01-19 MED ORDER — FOLIC ACID 1 MG PO TABS: 1.0000 mg | ORAL_TABLET | Freq: Every day | ORAL | 2 refills | Status: DC

## 2020-01-19 MED ORDER — HYDROCODONE-ACETAMINOPHEN 7.5-325 MG PO TABS: 1.0000 | ORAL_TABLET | ORAL | 0 refills | Status: DC | PRN

## 2020-01-19 MED ORDER — ADULT MULTIVITAMIN W/MINERALS CH: 1.0000 | ORAL_TABLET | Freq: Every day | ORAL | Status: DC

## 2020-01-19 MED ORDER — FERROUS SULFATE 325 (65 FE) MG PO TABS: 325.0000 mg | ORAL_TABLET | Freq: Every day | ORAL | 3 refills | Status: DC

## 2020-01-19 MED ORDER — METHOCARBAMOL 500 MG PO TABS: 1000.0000 mg | ORAL_TABLET | Freq: Three times a day (TID) | ORAL | 0 refills | Status: DC

## 2020-01-19 MED ORDER — ADULT MULTIVITAMIN W/MINERALS CH
1.0000 | ORAL_TABLET | Freq: Every day | ORAL | Status: DC
  Administered 2020-01-19 – 2020-01-23 (×5): 1 via ORAL
  Filled 2020-01-19 (×5): qty 1

## 2020-01-19 NOTE — Progress Notes (Addendum)
  PROGRESS NOTE  Patient was set to be discharged home today. However, we were notified regarding change in her blood culture results; previously 1/2 set with gram positive rod which was felt to be a contaminant. Now, second set also showing gram positive rod. She was on cipro/flagyl 3/20-3/23. She is not septic; no fevers and normal WBC. I discussed over the phone with pharmacist as well as ID. Dr. Daiva Eves recommends starting IV vancomycin until blood culture species can be identified to guide PO antibiotic regimen. I will also order repeat blood culture today. Remove PICC. Updated general surgery team this afternoon regarding recommendations.   Notified by IV team and RN that patient has very poor IV access and has had issues with IV placement in the past. Now that PICC will be removed, IV vanco administration will be difficult. After discussing with ID and pharmacy, adjusted antibiotics to ceftin and flagyl. Await identification of blood cultures drawn 3/20.    Noralee Stain, DO [facility] 01/19/2020, 1:20 PM  Available via Epic secure chat 7am-7pm After these hours, please refer to coverage provider listed on [email or web address]

## 2020-01-19 NOTE — Progress Notes (Addendum)
Physical Therapy Treatment Patient Details Name: Samantha Gonzales MRN: [id number] DOB: [birth year] Today's Date: 01/19/2020    History of Present Illness Admitted with Small Bowel Obstruction and underwent Exploratory Lap on 01/12/2020- Had come in with abdominal pain and vomiting. Has had agitation including pulling IV out- PMH includes essential HTN, possible hypertrophic cardiomyopathy, HLD, AKI, hypomagnesemia, hypophosphateemia, cognitive impairment, and tachycardia.,    PT Comments    Pt resistant to PT today. She agreed to sit at edge of bed only, she refused to stand or ambulate. Benefits of mobility were explained to pt.  Pt sat at edge of bed ~5 minutes. She c/o severe abdominal pain throughout PT session, pt had pain medication prior to PT session. May need ST-SNF if family is unable to provide 24* care at home.    Follow Up Recommendations  Supervision/Assistance - 24 hour;SNF;Supervision for mobility/OOB     Equipment Recommendations  None recommended by PT    Recommendations for Other Services       Precautions / Restrictions Precautions Precautions: Fall Restrictions Weight Bearing Restrictions: No    Mobility  Bed Mobility Overal bed mobility: Needs Assistance   Rolling: (using bedrails )   Supine to sit: Min assist Sit to supine: Min assist   General bed mobility comments: instructed pt in log roll technique but she instead sat straight up in bed pulling up on B rails, then required assist to pivot hips to EOB. Pt sat at EOB x 5 minutes  Transfers                 General transfer comment: pt refused 2* abdominal pain  Ambulation/Gait             General Gait Details: pt refused 2* abdominal pain   Stairs             Wheelchair Mobility    Modified Rankin (Stroke Patients Only)       Balance Overall balance assessment: Needs assistance Sitting-balance support: Feet supported;No upper extremity supported Sitting balance-Leahy  Scale: Fair                                      Cognition Arousal/Alertness: Awake/alert Behavior During Therapy: WFL for tasks assessed/performed Overall Cognitive Status: Impaired/Different from baseline Area of Impairment: Safety/judgement;Awareness;Memory;Orientation;Attention;Problem solving;Following commands                 Orientation Level: Disoriented to;Time Current Attention Level: Selective Memory: Decreased short-term memory Following Commands: Follows one step commands consistently;Follows one step commands with increased time Safety/Judgement: Decreased awareness of deficits;Decreased awareness of safety Awareness: Emergent Problem Solving: Requires verbal cues;Requires tactile cues;Difficulty sequencing General Comments: focused on pain      Exercises General Exercises - Lower Extremity Long Arc Quad: AROM;Both;5 reps;Seated    General Comments        Pertinent Vitals/Pain Pain Score: 8  Pain Location: abdomen Pain Descriptors / Indicators: Sore Pain Intervention(s): Limited activity within patient's tolerance;Monitored during session;Premedicated before session;Patient requesting pain meds-RN notified;RN gave pain meds during session    Home Living                      Prior Function            PT Goals (current goals can now be found in the care plan section) Acute Rehab PT Goals Patient Stated Goal: decrease pain PT Goal  Formulation: With patient Time For Goal Achievement: 01/31/20 Potential to Achieve Goals: Fair Progress towards PT goals: Not progressing toward goals - comment    Frequency    Min 2X/week      PT Plan Discharge plan needs to be updated    Co-evaluation              AM-PAC PT "6 Clicks" Mobility   Outcome Measure  Help needed turning from your back to your side while in a flat bed without using bedrails?: A Little Help needed moving from lying on your back to sitting on the side  of a flat bed without using bedrails?: A Little Help needed moving to and from a bed to a chair (including a wheelchair)?: A Little Help needed standing up from a chair using your arms (e.g., wheelchair or bedside chair)?: A Lot Help needed to walk in hospital room?: A Lot Help needed climbing 3-5 steps with a railing? : Total 6 Click Score: 14    End of Session   Activity Tolerance: Patient limited by pain Patient left: in bed;with call bell/phone within reach;with bed alarm set Nurse Communication: Mobility status PT Visit Diagnosis: Other abnormalities of gait and mobility (R26.89);Unsteadiness on feet (R26.81);Pain Pain - Right/Left: (mid) Pain - part of body: (abdomen)     Time: 3710-6269 PT Time Calculation (min) (ACUTE ONLY): 10 min  Charges:  $Therapeutic Activity: 8-22 mins                    Ralene Bathe Kistler PT 01/19/2020  Acute Rehabilitation Services Pager [phone] Office [phone]

## 2020-01-19 NOTE — Care Management Important Message (Signed)
Important Message  Patient Details IM Letter given to [name] SW Case Manager to present to the Patient Name: Samantha Gonzales MRN: [id number] Date of Birth: Nov 12, 1940   Medicare Important Message Given:  Yes     Caren Macadam 01/19/2020, 1:47 PM

## 2020-01-19 NOTE — Progress Notes (Addendum)
Pt c/o abdominal pain 9/10. All PO PRN's and scheduled meds given, pt states they didn't help. Pt continually getting up, setting off the bed alarm, tele sitter ineffective.  Pt educated about importance of PO meds over IV meds per MD note. Pt states she doesn't care if she can't go home, she just wants medicine. IV dilaudid given. Will continue to monitor.   Pt states she wants it added to her chart that she would like to speak with the doctor in the morning.

## 2020-01-19 NOTE — Consult Note (Signed)
Blood cultures obtained on 01/14/2020, have just come back with gram-positive rods.  Urine culture was negative.  She is currently asymptomatic.  Will review with Medicine Dr. Alvino Chapel. CBC Latest Ref Rng & Units 01/19/2020 01/18/2020 01/18/2020  WBC 4.0 - 10.5 K/uL 6.4 - 5.3  Hemoglobin 12.0 - 15.0 g/dL 7.4(L) 8.0(L) 7.1(L)  Hematocrit 36.0 - 46.0 % 22.4(L) 23.8(L) 22.0(L)  Platelets 150 - 400 K/uL 112(L) - 107(L)     She has discussed with ID and they recommend Vancomycin to cover her until cultures are complete, and we have a sensitivity for possible oral antibiotics at home.  We will DC the PICC line.  I have discussed findings with her daughter, and she understands and is agreeable.                                                                                 Home                            Cell [place] Daughter [phone]  [phone]

## 2020-01-19 NOTE — Progress Notes (Signed)
Pharmacy IV to PO conversion  The patient is ordered Diphenhydramine by the intravenous route with a linked PO option available.  Based on criteria approved by the Pharmacy and Therapeutics Committee and the Medical Executive Committee, the IV option is being discontinued.   Not prescribed to treat or prevent a severe allergic reaction   Not prescribed as premedication prior to receiving blood product, biologic medication, antimicrobial, or chemotherapy agent   The patient has tolerated at least one dose of an oral or enteral medication   The patient has no evidence of active gastrointestinal bleeding or impaired GI absorption (gastrectomy, short bowel, patient on TNA or NPO).   The patient is not undergoing procedural sedation  If you have any questions about this conversion, please contact the Pharmacy Department (ext [phone]).  Thank you.  Bernadene Person, PharmD, BCPS [phone] 01/19/2020, 3:03 PM

## 2020-01-19 NOTE — Progress Notes (Addendum)
Nutrition Follow-up  DOCUMENTATION CODES:   Severe malnutrition in context of chronic illness, Underweight  INTERVENTION:  - continue Boost Breeze TID, each supplement provides 250 kcal and 9 grams of protein. - continue order 30 mL Prostat BID, each supplement provides 100 kcal and 15 grams of protein. - will order daily multivitamin with minerals. - continue to encourage PO intakes. - able to talk with Surgery PA about lack of PO intakes today--reports patient has been eating and current issue with intakes is a very new/acute thing.    NUTRITION DIAGNOSIS:   Severe Malnutrition related to chronic illness as evidenced by moderate fat depletion, moderate muscle depletion, severe fat depletion, severe muscle depletion. -revised  GOAL:   Patient will meet greater than or equal to 90% of their needs -unmet  MONITOR:   PO intake, Supplement acceptance, Labs, Weight trends  ASSESSMENT:   79 year old female who was hospitalized -3/9 with SBO. She has medical history of HTN, hyperlipidemia, prior tubal ligation, and laparoscopic cholecystectomy on 09/05/19. She also underwent ERCP, sphincterotomy, and stent placement with sludge removal on 09/07/19.  Significant Events: 3/17- admission 3/18- initial RD assessment  3/19- double lumen PICC placed in R brachial; TPN initiation  3/21- TPN increased to goal rate of 90 ml/hr 3/22- diet advanced from NPO to CLD 3/23- diet advanced from CLD to FLD 3/24- TPN stopped; diet advanced from FLD to Soft at 0730 and to Regular at 0920    Per flow sheet documentation, patient consumed 0% of dinner last night and 0% of breakfast this AM. Patient reports that she has been having lack of interest in drinking more than half a cup of coffee or half of cup of juice. She states that with all foods she feels very nauseated to the point of feeling like she's going to vomit.   She does not feel that she will be able to eat, but states that she was informed  that she will not be allowed to leave the hospital until she eats. Boost Breeze was ordered TID on 3/24 and she has accepted 3 of 4 cartons of supplement. Prostat (30 ml) was ordered BID on 3/24 and she has accepted 3 of the 3 packets offered to her.    Labs reviewed; Ca: 8.3 mg/dl. Medications reviewed; 325 mg ferrous sulfate/day, 1 mg folvite/day, 2 g IV Mg sulfate x1 run 3/24, 40 mg protonix/day.      NUTRITION - FOCUSED PHYSICAL EXAM:    Most Recent Value  Orbital Region  Moderate depletion  Upper Arm Region  Severe depletion  Thoracic and Lumbar Region  Severe depletion  Buccal Region  Moderate depletion  Temple Region  Moderate depletion  Clavicle Bone Region  Severe depletion  Clavicle and Acromion Bone Region  Severe depletion  Scapular Bone Region  Moderate depletion  Dorsal Hand  Moderate depletion  Patellar Region  Unable to assess  Anterior Thigh Region  Unable to assess  Posterior Calf Region  Unable to assess  Edema (RD Assessment)  Unable to assess  Hair  Reviewed  Eyes  Reviewed  Mouth  Reviewed  Skin  Reviewed  Nails  Reviewed       Diet Order:   Diet Order            Diet regular Room service appropriate? Yes; Fluid consistency: Thin  Diet effective now              EDUCATION NEEDS:   Not appropriate for education at this time  Skin:  Skin Assessment: Skin Integrity Issues: Skin Integrity Issues:: Stage II, Incisions Stage II: coccyx Incisions: abdomen (3/18)  Last BM:  3/24  Height:   Ht Readings from Last 1 Encounters:  01/11/20 6\' 1"  (1.854 m)    Weight:   Wt Readings from Last 1 Encounters:  01/13/20 55.7 kg    Ideal Body Weight:  75 kg  BMI:  Body mass index is 16.2 kg/m.  Estimated Nutritional Needs:   Kcal:  9794-8016 kcal  Protein:  108-120 grams  Fluid:  >/= 2.2 L/day     Jarome Matin, MS, RD, LDN, CNSC Inpatient Clinical Dietitian RD pager # available in AMION  After hours/weekend pager # available in  [facility]

## 2020-01-19 NOTE — Progress Notes (Signed)
PROGRESS NOTE    Samantha Gonzales  MBW:[id number] DOB: 1941/09/27 DOA: 01/11/2020 PCP: System, Pcp Not In     Brief Narrative:  Samantha Gonzales is a 79 y.o. female with a history of HTN, HLD, mild mitral regurgitation who presented to the hospital with abdominal pain and vomiting. 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 Luisa Hart. TRH was consulted initially on 3/17 for medical clearance for surgery. Cardiology was also consulted on 3/21 as well.   New events last 24 hours / Subjective: Continues to complain of severe abdominal pain. States her diarrhea is about the same.   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 No high risk features. Recommended BP control. No other recommendations at this time    From medical stand point, patient is stable for discharge. Plans for home health on discharge and pain management/dressing care per primary team. Resume home meds norvasc and crestor on discharge. I've sent prescription for folic acid to her home pharmacy.     Antimicrobials:  Anti-infectives (From admission, onward)   Start     Dose/Rate Route Frequency Ordered Stop   01/14/20 1400  metroNIDAZOLE (FLAGYL) IVPB 500 mg  Status:   Discontinued     500 mg 100 mL/hr over 60 Minutes Intravenous Every 8 hours 01/14/20 1203 01/17/20 0830   01/14/20 1300  ciprofloxacin (CIPRO) IVPB 400 mg  Status:  Discontinued     400 mg 200 mL/hr over 60 Minutes Intravenous Every 12 hours 01/14/20 1203 01/17/20 0830   01/13/20 0600  cefoTEtan (CEFOTAN) 2 g in sodium chloride 0.9 % 100 mL IVPB  Status:  Discontinued     2 g 200 mL/hr over 30 Minutes Intravenous On call to O.R. 01/12/20 1749 01/12/20 1903   01/12/20 0600  cefoTEtan (CEFOTAN) 2 g in sodium chloride 0.9 % 100 mL IVPB     2 g 200 mL/hr over 30 Minutes Intravenous On call to O.R. 01/11/20 1022 01/12/20 1213       Objective: Vitals:   01/18/20 1410 01/18/20 1614 01/18/20 2037 01/19/20 0528  BP: (!) 136/56 (!) 137/59 (!) 154/65 136/66  Pulse: 98 97 (!) 102 (!) 102  Resp: 16 16 18 19   Temp: 98.4 F (36.9 C) 98.4 F (36.9 C) 98.4 F (36.9 C)   TempSrc: Oral Oral Oral   SpO2: 99% 100% 100% 99%  Weight:      Height:        Intake/Output Summary (Last 24 hours) at 01/19/2020 0946 Last data filed at 01/19/2020 0838 Gross per 24 hour  Intake 1385.54 ml  Output 900 ml  Net 485.54 ml   Filed Weights   01/11/20 0317 01/13/20 1132  Weight: 72.6 kg 55.7 kg  Examination: General exam: Appears calm and comfortable  Respiratory system: Clear to auscultation. Respiratory effort normal. Cardiovascular system: S1 & S2 heard, RRR. No pedal edema. Gastrointestinal system: Abdomen is nondistended, soft and TTP. Central nervous system: Alert. Non focal exam. Speech clear  Extremities: Symmetric in appearance bilaterally    Data Reviewed: I have personally reviewed following labs and imaging studies  CBC: Recent Labs  Lab 01/14/20 0341 01/15/20 0704 01/16/20 0347 01/16/20 0347 01/16/20 1621 01/17/20 0309 01/18/20 0827 01/18/20 1903 01/19/20 0300  WBC 11.9*   < > 7.2  --  7.8 5.9 5.3  --  6.4  NEUTROABS 11.2*  --  6.3  --   --   --   --   --   --   HGB 10.0*    < > 7.9*   < > 8.0* 7.4* 7.1* 8.0* 7.4*  HCT 30.6*   < > 24.6*   < > 24.5* 23.1* 22.0* 23.8* 22.4*  MCV 106.3*   < > 106.0*  --  106.1* 106.9* 106.8*  --  104.2*  PLT 144*   < > 85*  --  84* 76* 107*  --  112*   < > = values in this interval not displayed.   Basic Metabolic Panel: Recent Labs  Lab 01/14/20 0341 01/14/20 0341 01/15/20 0333 01/16/20 0347 01/17/20 0309 01/18/20 0351 01/19/20 0300  NA 145   < > 141 141 139 138 137  K 4.3   < > 3.4* 3.5 3.7 4.4 4.5  CL 110   < > 108 104 103 107 105  CO2 28   < > 28 31 30 27 26   GLUCOSE 148*   < > 179* 106* 93 107* 77  BUN 24*   < > 19 20 18 19 18   CREATININE 0.97   < > 0.62 0.55 0.44 0.46 0.49  CALCIUM 8.5*   < > 8.2* 8.3* 8.2* 8.4* 8.3*  MG 2.1   < > 1.8 2.1 2.0 1.7 1.9  PHOS 2.2*  --  2.3* 2.7 2.8 2.5  --    < > = values in this interval not displayed.   GFR: Estimated Creatinine Clearance: 52.6 mL/min (by C-G formula based on SCr of 0.49 mg/dL). Liver Function Tests: Recent Labs  Lab 01/13/20 0057 01/14/20 0341 01/15/20 0333 01/16/20 0347  AST 14* 14* 13* 11*  ALT 10 10 8 7   ALKPHOS 42 49 45 39  BILITOT 0.3 0.5 0.4 0.3  PROT 4.6* 4.7* 4.4* 4.3*  ALBUMIN 2.6* 2.2* 1.8* 1.8*   No results for input(s): LIPASE, AMYLASE in the last 168 hours. No results for input(s): AMMONIA in the last 168 hours. Coagulation Profile: No results for input(s): INR, PROTIME in the last 168 hours. Cardiac Enzymes: No results for input(s): CKTOTAL, CKMB, CKMBINDEX, TROPONINI in the last 168 hours. BNP (last 3 results) No results for input(s): PROBNP in the last 8760 hours. HbA1C: No results for input(s): HGBA1C in the last 72 hours. CBG: Recent Labs  Lab 01/17/20 1744 01/17/20 2355 01/18/20 0621 01/18/20 1147 01/18/20 1801  GLUCAP 121* 147* 116* 119* 96   Lipid Profile: No results for input(s): CHOL, HDL, LDLCALC, TRIG, CHOLHDL, LDLDIRECT in the last 72 hours. Thyroid Function Tests: No results for input(s): TSH, T4TOTAL,  FREET4, T3FREE, THYROIDAB in the last 72 hours. Anemia Panel: No results for input(s): VITAMINB12, FOLATE, FERRITIN, TIBC, IRON, RETICCTPCT in the last 72 hours. Sepsis Labs: Recent Labs  Lab 01/14/20 1045 01/16/20 0347 01/18/20 0351  PROCALCITON 43.36 15.91 4.88  LATICACIDVEN 0.9  --   --     Recent Results (from the past 240 hour(s))  Respiratory Panel by RT PCR (Flu A&B, Covid) - Nasopharyngeal Swab     Status: None   Collection Time: 01/11/20  7:28 AM   Specimen: Nasopharyngeal Swab  Result Value Ref Range Status   SARS Coronavirus 2 by RT PCR NEGATIVE NEGATIVE Final    Comment: (NOTE) SARS-CoV-2 target nucleic acids are NOT DETECTED. The SARS-CoV-2 RNA is generally detectable in upper respiratoy specimens during the acute phase of infection. The lowest concentration of SARS-CoV-2 viral copies this assay can detect is 131 copies/mL. A negative result does not preclude SARS-Cov-2 infection and should not be used as the sole basis for treatment or other patient management decisions. A negative result may occur with  improper specimen collection/handling, submission of specimen other than nasopharyngeal swab, presence of viral mutation(s) within the areas targeted by this assay, and inadequate number of viral copies (<131 copies/mL). A negative result must be combined with clinical observations, patient history, and epidemiological information. The expected result is Negative. Fact Sheet for Patients:  https://www.moore.com/ Fact Sheet for Healthcare Providers:  https://www.young.biz/ This test is not yet ap proved or cleared by the [place] FDA and  has been authorized for detection and/or diagnosis of SARS-CoV-2 by FDA under an Emergency Use Authorization (EUA). This EUA will remain  in effect (meaning this test can be used) for the duration of the COVID-19 declaration under Section 564(b)(1) of the Act, 21 U.S.C. section  360bbb-3(b)(1), unless the authorization is terminated or revoked sooner.    Influenza A by PCR NEGATIVE NEGATIVE Final   Influenza B by PCR NEGATIVE NEGATIVE Final    Comment: (NOTE) The Xpert Xpress SARS-CoV-2/FLU/RSV assay is intended as an aid in  the diagnosis of influenza from Nasopharyngeal swab specimens and  should not be used as a sole basis for treatment. Nasal washings and  aspirates are unacceptable for Xpert Xpress SARS-CoV-2/FLU/RSV  testing. Fact Sheet for Patients: https://www.moore.com/ Fact Sheet for Healthcare Providers: https://www.young.biz/ This test is not yet approved or cleared by the [place] FDA and  has been authorized for detection and/or diagnosis of SARS-CoV-2 by  FDA under an Emergency Use Authorization (EUA). This EUA will remain  in effect (meaning this test can be used) for the duration of the  Covid-19 declaration under Section 564(b)(1) of the Act, 21  U.S.C. section 360bbb-3(b)(1), unless the authorization is  terminated or revoked. Performed at [facility], [address] [place], [place] [address]   Surgical pcr screen     Status: None   Collection Time: 01/12/20 11:20 AM   Specimen: Nasal Mucosa; Nasal Swab  Result Value Ref Range Status   MRSA, PCR NEGATIVE NEGATIVE Final   Staphylococcus aureus NEGATIVE NEGATIVE Final    Comment: (NOTE) The Xpert SA Assay (FDA approved for NASAL specimens in patients 59 years of age and older), is one component of a comprehensive surveillance program. It is not intended to diagnose infection nor to guide or monitor treatment. Performed at [facility], [address] [place], [place] [address]   Culture, Urine     Status: None   Collection Time: 01/14/20 11:50 AM   Specimen: Urine, Random  Result Value Ref Range Status   Specimen Description   Final    URINE, RANDOM Performed at [facility],  [address] [place], [place] [address]  Special Requests   Final    NONE Performed at [facility], [address] [place], [place] [address]    Culture   Final    NO GROWTH Performed at [facility], [address], [place], [place] [address]    Report Status 01/16/2020 FINAL  Final  Culture, blood (routine x 2)     Status: None (Preliminary result)   Collection Time: 01/14/20 12:02 PM   Specimen: BLOOD  Result Value Ref Range Status   Specimen Description   Final    BLOOD Performed at [facility], [address] [place], [place] [address]    Special Requests   Final    BOTTLES DRAWN AEROBIC AND ANAEROBIC Blood Culture adequate volume Performed at [facility], [address] [place], [place] [address]    Culture  Setup Time   Final    ANAEROBIC BOTTLE ONLY GRAM POSITIVE RODS CRITICAL RESULT CALLED TO, READ BACK BY AND VERIFIED WITH: D [facility] PHARMD 01/17/20 JDW    Culture   Final    CULTURE REINCUBATED FOR BETTER GROWTH Performed at [facility], [address], [place], [place] [address]    Report Status PENDING  Incomplete  Culture, blood (routine x 2)     Status: None (Preliminary result)   Collection Time: 01/14/20 12:10 PM   Specimen: BLOOD  Result Value Ref Range Status   Specimen Description   Final    BLOOD LEFT WRIST Performed at [facility], [address], [place], [place] [address]    Special Requests   Final    BOTTLES DRAWN AEROBIC AND ANAEROBIC Blood Culture adequate volume Performed at [facility], [address], [place], [place] [address]    Culture  Setup Time   Final    GRAM POSITIVE RODS ANAEROBIC BOTTLE ONLY CRITICAL VALUE NOTED.  VALUE IS CONSISTENT WITH PREVIOUSLY REPORTED AND CALLED VALUE.    Culture   Final    CULTURE REINCUBATED FOR BETTER GROWTH CORRECTED ON 03/25 AT 0840: PREVIOUSLY REPORTED AS NO GROWTH 5  DAYS Performed at [facility], [address] [place], [place] [address]    Report Status PENDING  Incomplete      Radiology Studies: No results found.    Scheduled Meds: . Chlorhexidine Gluconate Cloth  6 each Topical Daily  . feeding supplement  1 Container Oral TID BM  . feeding supplement (PRO-STAT SUGAR FREE 64)  30 mL Oral BID  . [START ON 01/20/2020] ferrous sulfate  325 mg Oral Q breakfast  . folic acid  1 mg Oral Daily  . ketorolac  15 mg Intravenous Q6H  . methocarbamol  1,000 mg Oral TID  . pantoprazole  40 mg Oral Daily  . sodium chloride flush  10-40 mL Intracatheter Q12H   Continuous Infusions:    LOS: 8 days      Time spent: 20 minutes   Noralee Stain, DO [facility] 01/19/2020, 9:46 AM   Available via Epic secure chat 7am-7pm After these hours, please refer to coverage provider listed on [email or web address]

## 2020-01-20 LAB — CBC
HCT: 20.2 % — ABNORMAL LOW (ref 36.0–46.0)
Hemoglobin: 6.7 g/dL — CL (ref 12.0–15.0)
MCH: 34.9 pg — ABNORMAL HIGH (ref 26.0–34.0)
MCHC: 33.2 g/dL (ref 30.0–36.0)
MCV: 105.2 fL — ABNORMAL HIGH (ref 80.0–100.0)
Platelets: 161 10*3/uL (ref 150–400)
RBC: 1.92 MIL/uL — ABNORMAL LOW (ref 3.87–5.11)
RDW: 14.6 % (ref 11.5–15.5)
WBC: 9.1 10*3/uL (ref 4.0–10.5)
nRBC: 0 % (ref 0.0–0.2)

## 2020-01-20 LAB — BASIC METABOLIC PANEL
Anion gap: 5 (ref 5–15)
BUN: 15 mg/dL (ref 8–23)
CO2: 22 mmol/L (ref 22–32)
Calcium: 8.1 mg/dL — ABNORMAL LOW (ref 8.9–10.3)
Chloride: 107 mmol/L (ref 98–111)
Creatinine, Ser: 0.59 mg/dL (ref 0.44–1.00)
GFR calc Af Amer: >60 mL/min (ref >60)
GFR calc non Af Amer: >60 mL/min (ref >60)
Glucose, Bld: 79 mg/dL (ref 70–99)
Potassium: 4.1 mmol/L (ref 3.5–5.1)
Sodium: 134 mmol/L — ABNORMAL LOW (ref 135–145)

## 2020-01-20 LAB — CULTURE, BLOOD (ROUTINE X 2): Special Requests: ADEQUATE

## 2020-01-20 LAB — HEMOGLOBIN AND HEMATOCRIT, BLOOD
HCT: 28 % — ABNORMAL LOW (ref 36.0–46.0)
Hemoglobin: 9.4 g/dL — ABNORMAL LOW (ref 12.0–15.0)

## 2020-01-20 LAB — MAGNESIUM: Magnesium: 1.8 mg/dL (ref 1.7–2.4)

## 2020-01-20 LAB — PREPARE RBC (CROSSMATCH)

## 2020-01-20 MED ORDER — POLYETHYLENE GLYCOL 3350 17 G PO PACK
17.0000 g | PACK | Freq: Every day | ORAL | Status: DC
  Administered 2020-01-20 – 2020-01-23 (×4): 17 g via ORAL
  Filled 2020-01-20 (×4): qty 1

## 2020-01-20 MED ORDER — IBUPROFEN 400 MG PO TABS
400.0000 mg | ORAL_TABLET | Freq: Four times a day (QID) | ORAL | Status: DC | PRN
  Administered 2020-01-20 – 2020-01-22 (×5): 400 mg via ORAL
  Filled 2020-01-20 (×5): qty 1

## 2020-01-20 MED ORDER — SODIUM CHLORIDE 0.9% IV SOLUTION: Freq: Once | INTRAVENOUS | Status: AC

## 2020-01-20 MED ORDER — DOCUSATE SODIUM 100 MG PO CAPS
100.0000 mg | ORAL_CAPSULE | Freq: Two times a day (BID) | ORAL | Status: DC
  Administered 2020-01-20 – 2020-01-23 (×7): 100 mg via ORAL
  Filled 2020-01-20 (×7): qty 1

## 2020-01-20 MED ORDER — GABAPENTIN 100 MG PO CAPS
100.0000 mg | ORAL_CAPSULE | Freq: Three times a day (TID) | ORAL | Status: DC
  Administered 2020-01-20 – 2020-01-21 (×4): 100 mg via ORAL
  Filled 2020-01-20 (×4): qty 1

## 2020-01-20 NOTE — Progress Notes (Signed)
Patient has critical hemoglobin of 6.7. Paged floor coverage.

## 2020-01-20 NOTE — Progress Notes (Signed)
Occupational Therapy Treatment Patient Details Name: MODINE OPPENHEIMER MRN: [id number] DOB: May 11, 1941 Today's Date: 01/20/2020    History of present illness Admitted with Small Bowel Obstruction and underwent Exploratory Lap on 01/12/2020- Had come in with abdominal pain and vomiting. Has had agitation including pulling IV out- PMH includes essential HTN, possible hypertrophic cardiomyopathy, HLD, AKI, hypomagnesemia, hypophosphateemia, cognitive impairment, and tachycardia.,   OT comments  Pt agreeable to OT tx session. Pt was able to perform supine to sit with verbal cues for hand placement with Supv assist. Pt was able to sit EOB for donning/doffing of UE gown with setup assist only. No c/o dizziness. Pt was able to complete stand pivot transfer from bed to recliner with CGA only. Pt requested more pain meds. Pt was given relaxation techniques to perform and use of pillow to protect abdomen. Pt was able to complete grooming while sitting in chair. Pt will benefit from continued skilled OT services.   Follow Up Recommendations  Home health OT;Supervision/Assistance - 24 hour    Equipment Recommendations  None recommended by OT    Recommendations for Other Services      Precautions / Restrictions Precautions Precautions: Fall Precaution Comments: cognition Restrictions Weight Bearing Restrictions: No       Mobility Bed Mobility   Bed Mobility: Supine to Sit     Supine to sit: Min assist        Transfers Overall transfer level: Needs assistance   Transfers: Stand Pivot Transfers   Stand pivot transfers: Min guard            Balance   Sitting-balance support: Feet supported;No upper extremity supported Sitting balance-Leahy Scale: Fair       Standing balance-Leahy Scale: Fair                             ADL either performed or assessed with clinical judgement   ADL                   Upper Body Dressing : Set up                    Functional mobility during ADLs: Min guard       Vision       Perception     Praxis      Cognition Arousal/Alertness: Awake/alert Behavior During Therapy: WFL for tasks assessed/performed Overall Cognitive Status: Impaired/Different from baseline Area of Impairment: Safety/judgement;Awareness;Memory;Orientation;Attention;Problem solving;Following commands                 Orientation Level: Disoriented to;Time Current Attention Level: Selective Memory: Decreased short-term memory                  Exercises     Shoulder Instructions       General Comments      Pertinent Vitals/ Pain       Pain Assessment: 0-10 Pain Score: 6  Faces Pain Scale: Hurts even more Pain Location: abdomen Pain Descriptors / Indicators: Sore  Home Living                                          Prior Functioning/Environment              Frequency           Progress Toward Goals  OT  Goals(current goals can now be found in the care plan section)  Progress towards OT goals: Progressing toward goals  ADL Goals Pt Will Perform Grooming: sitting;with set-up Pt Will Perform Lower Body Dressing: with supervision;sit to/from stand Pt Will Transfer to Toilet: bedside commode;with min guard assist Pt Will Perform Toileting - Clothing Manipulation and hygiene: with min guard assist Pt Will Perform Tub/Shower Transfer: Tub transfer;with supervision;ambulating;3 in 1 Pt/caregiver will Perform Home Exercise Program: Increased strength;Both right and left upper extremity;With written HEP provided;With Supervision Additional ADL Goal #1: Patient will demonstrate emergent awareness during ADL routine.  Plan      Co-evaluation                 AM-PAC OT "6 Clicks" Daily Activity     Outcome Measure   Help from another person eating meals?: A Little Help from another person taking care of personal grooming?: A Little Help from another person  toileting, which includes using toliet, bedpan, or urinal?: Total Help from another person bathing (including washing, rinsing, drying)?: A Lot Help from another person to put on and taking off regular upper body clothing?: A Little Help from another person to put on and taking off regular lower body clothing?: A Lot 6 Click Score: 14    End of Session        Activity Tolerance Patient limited by pain   Patient Left with call bell/phone within reach;in chair;with chair alarm set;Other (comment)(telesitter)   Nurse Communication Mobility status;Patient requests pain meds        Time: 7628-3151 OT Time Calculation (min): 24 min  Charges: OT General Charges $OT Visit: 1 Visit OT Treatments $Self Care/Home Management : 8-22 mins $Therapeutic Activity: 8-22 mins  Mayley Lish OTR/L    Moana Munford 01/20/2020, 10:40 AM

## 2020-01-20 NOTE — Progress Notes (Signed)
Per Dr. Luisa Hart, he will address hemoglobin in rounds.

## 2020-01-20 NOTE — Progress Notes (Addendum)
PROGRESS NOTE    Samantha Gonzales  DGU:[id number] DOB: [birth year] DOA: 01/11/2020 PCP: System, Pcp Not In     Brief Narrative:  Samantha Gonzales is a 79 y.o. female with a history of HTN, HLD, mild mitral regurgitation who presented to the hospital with abdominal pain and vomiting. 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 Luisa Hart. TRH was consulted initially on 3/17 for medical clearance for surgery. Cardiology was also consulted on 3/21 as well.   New events last 24 hours / Subjective: Continues to have abdominal pain, no other complaints, states diarrhea is gone.   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 Now, second set also showing gram positive rod. She was on cipro/flagyl 3/20-3/23. She is not septic; no fevers and normal WBC -Discussed with pharmacy and ID. PICC removed. Due to poor IV access, elected to treat with PO ceftin and flagyl instead of IV vanco  -Repeat blood culture 3/25 pending  -Addendum: Blood culture from 3/20 shows clostridium paraputrificum. Discussed with ID. 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 No high risk features. Recommended BP control. No other recommendations at this time    Antimicrobials:  Anti-infectives (From admission, onward)   Start     Dose/Rate Route Frequency Ordered Stop   01/19/20 1700  cefUROXime (CEFTIN) tablet 500 mg     500 mg Oral 2 times daily with meals 01/19/20 1352     01/19/20 1430  metroNIDAZOLE (FLAGYL) tablet 500 mg     500 mg Oral Every 8 hours 01/19/20 1352     01/14/20 1400  metroNIDAZOLE (FLAGYL) IVPB 500 mg  Status:  Discontinued     500 mg 100 mL/hr over 60 Minutes Intravenous Every 8 hours 01/14/20 1203 01/17/20 0830   01/14/20 1300  ciprofloxacin (CIPRO) IVPB 400 mg  Status:  Discontinued     400 mg 200 mL/hr over 60 Minutes Intravenous Every 12 hours 01/14/20 1203 01/17/20 0830   01/13/20 0600  cefoTEtan (CEFOTAN) 2 g in sodium chloride 0.9 % 100 mL IVPB  Status:  Discontinued     2 g 200 mL/hr over 30 Minutes Intravenous On call to O.R. 01/12/20 1749 01/12/20 1903   01/12/20 0600  cefoTEtan (CEFOTAN) 2 g in sodium chloride 0.9 % 100 mL IVPB     2 g 200 mL/hr over 30 Minutes Intravenous On call to O.R. 01/11/20 1022 01/12/20 1213       Objective: Vitals:   01/19/20 2015 01/20/20 9563 01/20/20 8756 01/20/20 4332  BP: (!) 135/54 137/62 (!) 129/52 (!) 130/56  Pulse: (!) 108 100 (!) 103 98  Resp: 18 18 16 18   Temp: 98.9 F (37.2 C) 97.7 F (36.5 C) 98.6 F (37 C) 98.6 F (37 C)  TempSrc: Oral Oral Oral Oral  SpO2: 100% 100% 100% 99%  Weight:      Height:        Intake/Output Summary (Last 24 hours) at 01/20/2020 1010 Last data filed at 01/20/2020 0933 Gross per 24 hour  Intake 370 ml  Output 300 ml  Net 70 ml   Filed Weights   01/11/20 0317 01/13/20 1132  Weight: 72.6 kg 55.7 kg    Examination: General exam: Appears calm and comfortable  Respiratory system: Clear to auscultation. Respiratory effort  normal. Cardiovascular system: S1 & S2 heard, RRR. +murmur. No pedal edema. Gastrointestinal system: Abdomen is nondistended, soft Central nervous system: Alert and oriented. Non focal exam. Speech clear  Extremities: Symmetric in appearance bilaterally  Skin: No rashes, lesions or ulcers on exposed skin  Psychiatry: Stable    Data Reviewed: I have personally reviewed following labs and imaging studies  CBC: Recent Labs  Lab 01/14/20 0341 01/15/20 0704 01/16/20 0347 01/16/20 0347 01/16/20 1621 01/16/20 1621 01/17/20 0309 01/18/20 0827 01/18/20 1903 01/19/20 0300 01/20/20 0453  WBC 11.9*   < > 7.2   < > 7.8  --  5.9 5.3  --  6.4 9.1  NEUTROABS 11.2*  --  6.3  --   --   --   --   --   --   --   --   HGB 10.0*   < > 7.9*   < > 8.0*   < > 7.4* 7.1* 8.0* 7.4* 6.7*  HCT 30.6*   < > 24.6*   < > 24.5*   < > 23.1* 22.0* 23.8* 22.4* 20.2*  MCV 106.3*   < > 106.0*   < > 106.1*  --  106.9* 106.8*  --  104.2* 105.2*  PLT 144*   < > 85*   < > 84*  --  76* 107*  --  112* 161   < > = values in this interval not displayed.   Basic Metabolic Panel: Recent Labs  Lab 01/14/20 0341 01/14/20 0341 01/15/20 0333 01/15/20 0333 01/16/20 0347 01/17/20 0309 01/18/20 0351 01/19/20 0300 01/20/20 0453  NA 145   < > 141   < > 141 139 138 137 134*  K 4.3   < > 3.4*   < > 3.5 3.7 4.4 4.5 4.1  CL 110   < > 108   < > 104 103 107 105 107  CO2 28   < > 28   < > 31 30 27 26 22   GLUCOSE 148*   < > 179*   < > 106* 93 107* 77 79  BUN 24*   < > 19   < > 20 18 19 18 15   CREATININE 0.97   < > 0.62   < > 0.55 0.44 0.46 0.49 0.59  CALCIUM 8.5*   < > 8.2*   < > 8.3* 8.2* 8.4* 8.3* 8.1*  MG 2.1   < > 1.8   < > 2.1 2.0 1.7 1.9 1.8  PHOS 2.2*  --  2.3*  --  2.7 2.8 2.5  --   --    < > = values in this interval not displayed.   GFR: Estimated Creatinine Clearance: 52.6 mL/min (by C-G formula based on  SCr of 0.59 mg/dL). Liver Function Tests: Recent Labs  Lab 01/14/20 0341 01/15/20 0333 01/16/20 0347    AST 14* 13* 11*  ALT 10 8 7   ALKPHOS 49 45 39  BILITOT 0.5 0.4 0.3  PROT 4.7* 4.4* 4.3*  ALBUMIN 2.2* 1.8* 1.8*   No results for input(s): LIPASE, AMYLASE in the last 168 hours. No results for input(s): AMMONIA in the last 168 hours. Coagulation Profile: No results for input(s): INR, PROTIME in the last 168 hours. Cardiac Enzymes: No results for input(s): CKTOTAL, CKMB, CKMBINDEX, TROPONINI in the last 168 hours. BNP (last 3 results) No results for input(s): PROBNP in the last 8760 hours. HbA1C: No results for input(s): HGBA1C in the last 72 hours. CBG: Recent Labs  Lab 01/17/20 1744 01/17/20 2355 01/18/20 0621 01/18/20 1147 01/18/20 1801  GLUCAP 121* 147* 116* 119* 96   Lipid Profile: No results for input(s): CHOL, HDL, LDLCALC, TRIG, CHOLHDL, LDLDIRECT in the last 72 hours. Thyroid Function Tests: No results for input(s): TSH, T4TOTAL, FREET4, T3FREE, THYROIDAB in the last 72 hours. Anemia Panel: No results for input(s): VITAMINB12, FOLATE, FERRITIN, TIBC, IRON, RETICCTPCT in the last 72 hours. Sepsis Labs: Recent Labs  Lab 01/14/20 1045 01/16/20 0347 01/18/20 0351  PROCALCITON 43.36 15.91 4.88  LATICACIDVEN 0.9  --   --     Recent Results (from the past 240 hour(s))  Respiratory Panel by RT PCR (Flu A&B, Covid) - Nasopharyngeal Swab     Status: None   Collection Time: 01/11/20  7:28 AM   Specimen: Nasopharyngeal Swab  Result Value Ref Range Status   SARS Coronavirus 2 by RT PCR NEGATIVE NEGATIVE Final    Comment: (NOTE) SARS-CoV-2 target nucleic acids are NOT DETECTED. The SARS-CoV-2 RNA is generally detectable in upper respiratoy specimens during the acute phase of infection. The lowest concentration of SARS-CoV-2 viral copies this assay can detect is 131 copies/mL. A negative result does not preclude SARS-Cov-2 infection and should not be used as the sole basis for treatment or other patient management decisions. A negative result may occur with   improper specimen collection/handling, submission of specimen other than nasopharyngeal swab, presence of viral mutation(s) within the areas targeted by this assay, and inadequate number of viral copies (<131 copies/mL). A negative result must be combined with clinical observations, patient history, and epidemiological information. The expected result is Negative. Fact Sheet for Patients:  PinkCheek.be Fact Sheet for Healthcare Providers:  GravelBags.it This test is not yet ap proved or cleared by the [place] FDA and  has been authorized for detection and/or diagnosis of SARS-CoV-2 by FDA under an Emergency Use Authorization (EUA). This EUA will remain  in effect (meaning this test can be used) for the duration of the COVID-19 declaration under Section 564(b)(1) of the Act, 21 U.S.C. section 360bbb-3(b)(1), unless the authorization is terminated or revoked sooner.    Influenza A by PCR NEGATIVE NEGATIVE Final   Influenza B by PCR NEGATIVE NEGATIVE Final    Comment: (NOTE) The Xpert Xpress SARS-CoV-2/FLU/RSV assay is intended as an aid in  the diagnosis of influenza from Nasopharyngeal swab specimens and  should not be used as a sole basis for treatment. Nasal washings and  aspirates are unacceptable for Xpert Xpress SARS-CoV-2/FLU/RSV  testing. Fact Sheet for Patients: PinkCheek.be Fact Sheet for Healthcare Providers: GravelBags.it This test is not yet approved or cleared by the [place] FDA and  has been authorized for detection and/or diagnosis of SARS-CoV-2 by  FDA under an Emergency  Use Authorization (EUA). This EUA will remain  in effect (meaning this test can be used) for the duration of the  Covid-19 declaration under Section 564(b)(1) of the Act, 21  U.S.C. section 360bbb-3(b)(1), unless the authorization is  terminated or revoked. Performed at  [facility], [address] [place], [place] [address]   Surgical pcr screen     Status: None   Collection Time: 01/12/20 11:20 AM   Specimen: Nasal Mucosa; Nasal Swab  Result Value Ref Range Status   MRSA, PCR NEGATIVE NEGATIVE Final   Staphylococcus aureus NEGATIVE NEGATIVE Final    Comment: (NOTE) The Xpert SA Assay (FDA approved for NASAL specimens in patients 522 years of age and older), is one component of a comprehensive surveillance program. It is not intended to diagnose infection nor to guide or monitor treatment. Performed at [facility], [address] [place], [place] [address]   Culture, Urine     Status: None   Collection Time: 01/14/20 11:50 AM   Specimen: Urine, Random  Result Value Ref Range Status   Specimen Description   Final    URINE, RANDOM Performed at [facility], [address] [place], [place] [address]    Special Requests   Final    NONE Performed at [facility], [address] [place], [place] [address]    Culture   Final    NO GROWTH Performed at [facility], [address] [place], [place] [address]    Report Status 01/16/2020 FINAL  Final  Culture, blood (routine x 2)     Status: None (Preliminary result)   Collection Time: 01/14/20 12:02 PM   Specimen: BLOOD  Result Value Ref Range Status   Specimen Description   Final    BLOOD Performed at [facility], [address] [place], [place] [address]    Special Requests   Final    BOTTLES DRAWN AEROBIC AND ANAEROBIC Blood Culture adequate volume Performed at [facility], [address] [place], [place] [address]    Culture  Setup Time   Final    ANAEROBIC BOTTLE ONLY GRAM POSITIVE RODS CRITICAL RESULT CALLED TO, READ BACK BY AND VERIFIED WITH: D [facility] PHARMD 01/17/20 JDW    Culture   Final    HOLDING FOR POSSIBLE ANAEROBE Performed at [facility], [address] [place], [place] [address]    Report Status PENDING  Incomplete  Culture, blood (routine x 2)     Status: None (Preliminary result)   Collection Time: 01/14/20 12:10 PM   Specimen: BLOOD  Result Value Ref Range Status   Specimen Description   Final    BLOOD LEFT WRIST Performed at [facility], [address] [place], [place] [address]    Special Requests   Final    BOTTLES DRAWN AEROBIC AND ANAEROBIC Blood Culture adequate volume Performed at [facility], [address] [place], [place] [address]    Culture  Setup Time   Final    GRAM POSITIVE RODS ANAEROBIC BOTTLE ONLY CRITICAL VALUE NOTED.  VALUE IS CONSISTENT WITH PREVIOUSLY REPORTED AND CALLED VALUE.    Culture   Final    CULTURE REINCUBATED FOR BETTER GROWTH CORRECTED ON 03/25 AT 0840: PREVIOUSLY REPORTED AS NO GROWTH 5 DAYS Performed at [facility], [address] [place], [place] [address]    Report Status PENDING  Incomplete  Culture,  blood (routine x 2)     Status: None (Preliminary result)   Collection Time: 01/19/20  2:11 PM   Specimen: BLOOD LEFT FOREARM  Result Value Ref Range Status   Specimen Description   Final    BLOOD LEFT FOREARM Performed at [facility], [address], [place], [place] [address]    Special Requests   Final    BOTTLES DRAWN AEROBIC ONLY Blood Culture adequate volume Performed at [facility], [address], [place], [place] [address]    Culture   Final    NO GROWTH < 24 HOURS Performed at [facility], [address], [place], [place] [address]    Report Status PENDING  Incomplete  Culture, blood (routine x 2)     Status: None (Preliminary result)   Collection Time: 01/19/20  2:11 PM   Specimen: BLOOD LEFT FOREARM  Result Value Ref Range Status   Specimen Description   Final    BLOOD LEFT FOREARM Performed at [facility], [address]  [place], [place] [address]    Special Requests   Final    BOTTLES DRAWN AEROBIC ONLY Blood Culture adequate volume Performed at [facility], [address], [place], [place] [address]    Culture   Final    NO GROWTH < 24 HOURS Performed at [facility], [address], [place], [place] [address]    Report Status PENDING  Incomplete      Radiology Studies: No results found.    Scheduled Meds: . sodium chloride   Intravenous Once  . cefUROXime  500 mg Oral BID WC  . Chlorhexidine Gluconate Cloth  6 each Topical Daily  . docusate sodium  100 mg Oral BID  . feeding supplement  1 Container Oral TID BM  . feeding supplement (PRO-STAT SUGAR FREE 64)  30 mL Oral BID  . ferrous sulfate  325 mg Oral Q breakfast  . folic acid  1 mg Oral Daily  . gabapentin  100 mg Oral TID  . methocarbamol  1,000 mg Oral TID  . metroNIDAZOLE  500 mg Oral Q8H  . multivitamin with minerals  1 tablet Oral Daily  . pantoprazole  40 mg Oral Daily  . polyethylene glycol  17 g Oral Daily  . sodium chloride flush  10-40 mL Intracatheter Q12H   Continuous Infusions:    LOS: 9 days      Time spent: 25 minutes   Noralee Stain, DO [facility] 01/20/2020, 10:10 AM   Available via Epic secure chat 7am-7pm After these hours, please refer to coverage provider listed on [email or web address]

## 2020-01-20 NOTE — Progress Notes (Signed)
[facility] [place] [facility] Progress Note  8 Days Post-Op  Subjective: Patient continues to complain of pain. Patient wants more narcotic pain medication, we discussed trying to maximize non-narcotic modalities first. Has not had a BM in 2 days. UOP good.   Objective: Vital signs in last 24 hours: Temp:  [97.7 F (36.5 C)-98.9 F (37.2 C)] 98.6 F (37 C) (03/26 0910) Pulse Rate:  [98-108] 98 (03/26 0910) Resp:  [16-18] 18 (03/26 0910) BP: (129-137)/(52-74) 130/56 (03/26 0910) SpO2:  [99 %-100 %] 99 % (03/26 0910) Last BM Date: 01/18/20  Intake/Output from previous day: 03/25 0701 - 03/26 0700 In: 430 [P.O.:430] Out: 550 [Urine:550] Intake/Output this shift: No intake/output data recorded.  PE: General: pleasant,cachectic,NAD Heart: regular, rate, and rhythm. Normal s1,s2. No obvious murmurs, gallops, or rubs noted. Palpable radial and pedal pulses bilaterally Lungs: CTAB, no wheezes, rhonchi, or rales noted. Respiratory effort nonlabored Abd: soft,appropriately ttp, ND,BS hypoactive MS: all 4 extremities are symmetrical with no cyanosis, clubbing, or edema. Skin: warm and dry with no masses, lesions, or rashes   Lab Results:  Recent Labs    01/19/20 0300 01/20/20 0453  WBC 6.4 9.1  HGB 7.4* 6.7*  HCT 22.4* 20.2*  PLT 112* 161   BMET Recent Labs    01/19/20 0300 01/20/20 0453  NA 137 134*  K 4.5 4.1  CL 105 107  CO2 26 22  GLUCOSE 77 79  BUN 18 15  CREATININE 0.49 0.59  CALCIUM 8.3* 8.1*   PT/INR No results for input(s): LABPROT, INR in the last 72 hours. CMP     Component Value Date/Time   NA 134 (L) 01/20/2020 0453   K 4.1 01/20/2020 0453   CL 107 01/20/2020 0453   CO2 22 01/20/2020 0453   GLUCOSE 79 01/20/2020 0453   BUN 15 01/20/2020 0453   CREATININE 0.59 01/20/2020 0453   CALCIUM 8.1 (L) 01/20/2020 0453   PROT 4.3 (L) 01/16/2020 0347   ALBUMIN 1.8 (L) 01/16/2020 0347   AST 11 (L) 01/16/2020 0347   ALT 7 01/16/2020 0347   ALKPHOS 39  01/16/2020 0347   BILITOT 0.3 01/16/2020 0347   GFRNONAA >60 01/20/2020 0453   GFRAA >60 01/20/2020 0453   Lipase     Component Value Date/Time   LIPASE 21 01/11/2020 0728       Studies/Results: No results found.  Anti-infectives: Anti-infectives (From admission, onward)   Start     Dose/Rate Route Frequency Ordered Stop   01/19/20 1700  cefUROXime (CEFTIN) tablet 500 mg     500 mg Oral 2 times daily with meals 01/19/20 1352     01/19/20 1430  metroNIDAZOLE (FLAGYL) tablet 500 mg     500 mg Oral Every 8 hours 01/19/20 1352     01/14/20 1400  metroNIDAZOLE (FLAGYL) IVPB 500 mg  Status:  Discontinued     500 mg 100 mL/hr over 60 Minutes Intravenous Every 8 hours 01/14/20 1203 01/17/20 0830   01/14/20 1300  ciprofloxacin (CIPRO) IVPB 400 mg  Status:  Discontinued     400 mg 200 mL/hr over 60 Minutes Intravenous Every 12 hours 01/14/20 1203 01/17/20 0830   01/13/20 0600  cefoTEtan (CEFOTAN) 2 g in sodium chloride 0.9 % 100 mL IVPB  Status:  Discontinued     2 g 200 mL/hr over 30 Minutes Intravenous On call to O.R. 01/12/20 1749 01/12/20 1903   01/12/20 0600  cefoTEtan (CEFOTAN) 2 g in sodium chloride 0.9 % 100 mL IVPB  2 g 200 mL/hr over 30 Minutes Intravenous On call to O.R. 01/11/20 1022 01/12/20 1213       Assessment/Plan Chronic abdominal pain- hydrocodoneuse since 01/25/18 Hypertension LVH Mitral Murmur Aortic sclerosis/bruit Hyperlipidemia Severe protein calorie malnutrition - prealbumin 15.5 3/25 from 8.1 3/22, continue nutritional supplements Acute on chronic anemia - hgb 6.7 this AM, s/p 1 unit PRBC 3/24, transfuse 2 more units today  Bacteremia - organism ID and sensitivities pending  Recurrent SBO S/P ex-lap with small bowel resection and LOA 01/12/20 Dr. Luisa Hart - POD#8 -continue diet -patient continues to be hyper-focused on pain meds, will add prn motrin ans scheduled gabapentin today to try to maximize non-narcotic pain meds - continue to  encourage mobilization- PT/OT - BID dressing changes  FEN:reg diet VS:[id number]/[id number] 3/20>3/23; PO ceftin/flagyl 3/25>> BFM:ZUAU  Follow-up: Dr. Luisa Hart    LOS: 9 days    Wells Guiles , [facility] 01/20/2020, 9:26 AM Please see Amion for pager number during day hours 7:00am-4:30pm

## 2020-01-20 NOTE — Progress Notes (Addendum)
Patient was found chewing tobacco, it was taken and put in her chart and it was explained to her that tobacco was against hospital policy. I again found the patient with tobacco in her bed and mouth today, I threw it in the garbage and told her again that this was against hospital policy and if she continues to get tobacco, we would throw it away as well.   Patient had to "spit out her tobacco" again at 1610 in order to swallow her medications.

## 2020-01-20 NOTE — Progress Notes (Signed)
Critical hemoglobin 6.7 Paged Tom Cornett

## 2020-01-21 LAB — BASIC METABOLIC PANEL
Anion gap: 8 (ref 5–15)
BUN: 11 mg/dL (ref 8–23)
CO2: 21 mmol/L — ABNORMAL LOW (ref 22–32)
Calcium: 8.4 mg/dL — ABNORMAL LOW (ref 8.9–10.3)
Chloride: 110 mmol/L (ref 98–111)
Creatinine, Ser: 0.59 mg/dL (ref 0.44–1.00)
GFR calc Af Amer: >60 mL/min (ref >60)
GFR calc non Af Amer: >60 mL/min (ref >60)
Glucose, Bld: 67 mg/dL — ABNORMAL LOW (ref 70–99)
Potassium: 3.9 mmol/L (ref 3.5–5.1)
Sodium: 139 mmol/L (ref 135–145)

## 2020-01-21 LAB — CBC
HCT: 27.6 % — ABNORMAL LOW (ref 36.0–46.0)
Hemoglobin: 9.3 g/dL — ABNORMAL LOW (ref 12.0–15.0)
MCH: 33.9 pg (ref 26.0–34.0)
MCHC: 33.7 g/dL (ref 30.0–36.0)
MCV: 100.7 fL — ABNORMAL HIGH (ref 80.0–100.0)
Platelets: 234 10*3/uL (ref 150–400)
RBC: 2.74 MIL/uL — ABNORMAL LOW (ref 3.87–5.11)
RDW: 16.7 % — ABNORMAL HIGH (ref 11.5–15.5)
WBC: 13.4 10*3/uL — ABNORMAL HIGH (ref 4.0–10.5)
nRBC: 0 % (ref 0.0–0.2)

## 2020-01-21 MED ORDER — GABAPENTIN 100 MG PO CAPS
200.0000 mg | ORAL_CAPSULE | Freq: Three times a day (TID) | ORAL | Status: DC
  Administered 2020-01-21 – 2020-01-23 (×6): 200 mg via ORAL
  Filled 2020-01-21 (×6): qty 2

## 2020-01-21 NOTE — Progress Notes (Signed)
[facility] Progress Note  9 Days Post-Op  Subjective: She continues to complain of pain.  Overnight events noted, patient states she did not know there were any rules about tobacco in the hospital.  Objective: Vital signs in last 24 hours: Temp:  [97.7 F (36.5 C)-99.4 F (37.4 C)] 98.8 F (37.1 C) (03/27 0511) Pulse Rate:  [92-103] 92 (03/27 0511) Resp:  [14-18] 16 (03/27 0511) BP: (111-135)/(52-60) 120/52 (03/27 0511) SpO2:  [98 %-100 %] 98 % (03/27 0511) Weight:  [57.8 kg] 57.8 kg (03/26 1729) Last BM Date: 01/18/20  Intake/Output from previous day: 03/26 0701 - 03/27 0700 In: 936.7 [P.O.:300; Blood:636.7] Out: 1550 [Urine:1550] Intake/Output this shift: No intake/output data recorded.  PE: General: pleasant,cachectic,NAD Heart: regular, rate, and rhythm.  Lungs: Respiratory effort nonlabored Abd: soft,appropriately ttp, ND,midline wound appears healthy, no abnormal drainage MS: all 4 extremities are symmetrical with no cyanosis, clubbing, or edema. Skin: warm and dry with no masses, lesions, or rashes   Lab Results:  Recent Labs    01/20/20 0453 01/20/20 0453 01/20/20 2047 01/21/20 0510  WBC 9.1  --   --  13.4*  HGB 6.7*   < > 9.4* 9.3*  HCT 20.2*   < > 28.0* 27.6*  PLT 161  --   --  234   < > = values in this interval not displayed.   BMET Recent Labs    01/20/20 0453 01/21/20 0510  NA 134* 139  K 4.1 3.9  CL 107 110  CO2 22 21*  GLUCOSE 79 67*  BUN 15 11  CREATININE 0.59 0.59  CALCIUM 8.1* 8.4*   PT/INR No results for input(s): LABPROT, INR in the last 72 hours. CMP     Component Value Date/Time   NA 139 01/21/2020 0510   K 3.9 01/21/2020 0510   CL 110 01/21/2020 0510   CO2 21 (L) 01/21/2020 0510   GLUCOSE 67 (L) 01/21/2020 0510   BUN 11 01/21/2020 0510   CREATININE 0.59 01/21/2020 0510   CALCIUM 8.4 (L) 01/21/2020 0510   PROT 4.3 (L) 01/16/2020 0347   ALBUMIN 1.8 (L) 01/16/2020 0347   AST 11 (L) 01/16/2020 0347   ALT 7 01/16/2020 0347   ALKPHOS 39 01/16/2020 0347   BILITOT 0.3 01/16/2020 0347   GFRNONAA >60 01/21/2020 0510   GFRAA >60 01/21/2020 0510   Lipase     Component Value Date/Time   LIPASE 21 01/11/2020 0728       Studies/Results: No results found.  Anti-infectives: Anti-infectives (From admission, onward)   Start     Dose/Rate Route Frequency Ordered Stop   01/19/20 1700  cefUROXime (CEFTIN) tablet 500 mg  Status:  Discontinued     500 mg Oral 2 times daily with meals 01/19/20 1352 01/20/20 1452   01/19/20 1430  metroNIDAZOLE (FLAGYL) tablet 500 mg     500 mg Oral Every 8 hours 01/19/20 1352 01/26/20 1359   01/14/20 1400  metroNIDAZOLE (FLAGYL) IVPB 500 mg  Status:  Discontinued     500 mg 100 mL/hr over 60 Minutes Intravenous Every 8 hours 01/14/20 1203 01/17/20 0830   01/14/20 1300  ciprofloxacin (CIPRO) IVPB 400 mg  Status:  Discontinued     400 mg 200 mL/hr over 60 Minutes Intravenous Every 12 hours 01/14/20 1203 01/17/20 0830   01/13/20 0600  cefoTEtan (CEFOTAN) 2 g in sodium chloride 0.9 % 100 mL IVPB  Status:  Discontinued     2 g 200 mL/hr over 30 Minutes Intravenous  On call to O.R. 01/12/20 1749 01/12/20 1903   01/12/20 0600  cefoTEtan (CEFOTAN) 2 g in sodium chloride 0.9 % 100 mL IVPB     2 g 200 mL/hr over 30 Minutes Intravenous On call to O.R. 01/11/20 1022 01/12/20 1213       Assessment/Plan Chronic abdominal pain- hydrocodoneuse since 01/25/18 Hypertension LVH Mitral Murmur Aortic sclerosis/bruit Hyperlipidemia Severe protein calorie malnutrition - prealbumin 15.5 3/25 from 8.1 3/22, continue nutritional supplements Acute on chronic anemia - hgb 6.7 this AM, s/p 1 unit PRBC 3/24, 2 units 3/26, appropriate response Bacteremia - organism ID and sensitivities pending.  Currently on Flagyl per ID.  White count has gone up this morning.  Recurrent SBO S/P ex-lap with small bowel resection and LOA 01/12/20 Dr. Luisa Hart - POD#9 -continue diet -patient  continues to be hyper-focused on pain meds, will increase gabapentin.  Strongly recommend no increase in narcotics. - continue to encourage mobilization- PT/OT - BID dressing changes  FEN:reg diet GY:BNLWH/KNZUDO 3/20>3/23; PO ceftin/flagyl 3/25>> DQV:HQIT  Follow-up: Dr. Naomie Dean labs tomorrow.  Currently what is keeping her here is her pain and her bacteremia.  No further surgical needs.    LOS: 10 days    Berna Bue MD [facility] Surgery 01/21/2020, 8:22 AM Please see Amion for pager number during day hours 7:00am-4:30pm

## 2020-01-21 NOTE — Progress Notes (Signed)
PROGRESS NOTE    Samantha Gonzales  NWG:[id number] DOB: [birth year] DOA: 01/11/2020 PCP: System, Pcp Not In     Brief Narrative:  Samantha Gonzales is a 79 y.o. female with a history of HTN, HLD, mild mitral regurgitation who presented to the hospital with abdominal pain and vomiting. 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 Luisa Hart. TRH was consulted initially on 3/17 for medical clearance for surgery. Cardiology was also consulted on 3/21 as well. Blood culture positive for clostridium paraputrificum, treatment with flagyl started after discussing with ID.   New events last 24 hours / Subjective: Ate some apple sauce and coffee this morning. Continues to complain of pain.   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 Discussed with ID. 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 No high risk features. Recommended BP control. No other recommendations at this time    Medically stable for discharge once cleared by  primary service. Pain control remains issue. Can discharge with PO flagyl to complete 7 day treatment 3/25-4/1.   Antimicrobials:  Anti-infectives (From admission, onward)   Start     Dose/Rate Route Frequency Ordered Stop   01/19/20 1700  cefUROXime (CEFTIN) tablet 500 mg  Status:  Discontinued     500 mg Oral 2 times daily with meals 01/19/20 1352 01/20/20 1452   01/19/20 1430  metroNIDAZOLE (FLAGYL) tablet 500 mg     500 mg Oral Every 8 hours 01/19/20 1352 01/26/20 1359   01/14/20 1400  metroNIDAZOLE (FLAGYL) IVPB 500 mg  Status:  Discontinued     500 mg 100 mL/hr over 60 Minutes Intravenous Every 8 hours 01/14/20 1203 01/17/20 0830   01/14/20 1300  ciprofloxacin (CIPRO) IVPB 400 mg  Status:  Discontinued     400 mg 200 mL/hr over 60 Minutes Intravenous Every 12 hours 01/14/20 1203 01/17/20 0830   01/13/20 0600  cefoTEtan (CEFOTAN) 2 g in sodium chloride 0.9 % 100 mL IVPB  Status:  Discontinued     2 g 200 mL/hr over 30 Minutes Intravenous On call to O.R. 01/12/20 1749 01/12/20 1903   01/12/20 0600  cefoTEtan (CEFOTAN) 2 g in sodium chloride 0.9 % 100 mL IVPB     2 g 200 mL/hr over 30 Minutes Intravenous On call to O.R. 01/11/20 1022 01/12/20 1213       Objective: Vitals:   01/20/20 1330 01/20/20 1729 01/20/20 2041 01/21/20 0511  BP: (!) 111/58  (!) 132/54 (!) 120/52  Pulse: 98  99 92  Resp: 14  16 16  Temp: 99.4 F (37.4 C)  98.8 F (37.1 C) 98.8 F (37.1 C)  TempSrc: Oral  Oral Oral  SpO2: 100%  100% 98%  Weight:  57.8 kg    Height:        Intake/Output Summary (Last 24 hours) at 01/21/2020 0959 Last data filed at 01/21/2020 0918 Gross per 24 hour  Intake 876.67 ml  Output 1550 ml  Net -673.33 ml   Filed Weights   01/11/20 0317 01/13/20 1132 01/20/20 1729  Weight: 72.6 kg 55.7 kg 57.8 kg    Examination: General exam: Appears calm and comfortable  Respiratory system: Clear to auscultation. Respiratory effort normal. Cardiovascular system: S1 & S2 heard, RRR.  No pedal edema. Gastrointestinal system: Abdomen is nondistended, soft and nontender. Central nervous system: Alert and oriented. Non focal exam. Speech clear  Extremities: Symmetric in appearance bilaterally  Psychiatry: Stable     Data Reviewed: I have personally reviewed following labs and imaging studies  CBC: Recent Labs  Lab 01/16/20 0347 01/16/20 1621 01/17/20 0309 01/17/20 0309 01/18/20 0827 01/18/20 0827 01/18/20 1903 01/19/20 0300 01/20/20 0453 01/20/20 2047 01/21/20 0510  WBC 7.2   < > 5.9  --  5.3  --   --  6.4 9.1  --  13.4*  NEUTROABS 6.3  --   --   --   --   --   --   --   --   --   --   HGB 7.9*   < > 7.4*   < > 7.1*   < > 8.0* 7.4* 6.7* 9.4* 9.3*  HCT 24.6*   < > 23.1*   < > 22.0*   < > 23.8* 22.4* 20.2* 28.0* 27.6*  MCV 106.0*   < > 106.9*  --  106.8*  --   --  104.2* 105.2*  --  100.7*  PLT 85*   < > 76*  --  107*  --   --  112* 161  --  234   < > = values in this interval not displayed.   Basic Metabolic Panel: Recent Labs  Lab 01/15/20 0333 01/15/20 0333 01/16/20 0347 01/16/20 0347 01/17/20 0309 01/18/20 0351 01/19/20 0300 01/20/20 0453 01/21/20 0510  NA 141   < > 141   < > 139 138 137 134* 139  K 3.4*   < > 3.5   < > 3.7 4.4 4.5 4.1 3.9  CL 108   < > 104   < > 103 107 105 107 110  CO2 28   < > 31   < > 30 27 26 22  21*  GLUCOSE 179*   < > 106*   < > 93 107* 77 79 67*  BUN 19   < > 20   < > 18 19 18 15 11   CREATININE 0.62   < > 0.55   < > 0.44 0.46 0.49 0.59 0.59  CALCIUM 8.2*   < > 8.3*   < > 8.2* 8.4* 8.3* 8.1* 8.4*  MG 1.8   < > 2.1  --  2.0 1.7 1.9 1.8  --   PHOS 2.3*  --  2.7  --  2.8 2.5  --   --   --    < > = values in this interval not displayed.   GFR: Estimated Creatinine Clearance: 54.6 mL/min (by C-G formula based on SCr of 0.59 mg/dL). Liver Function Tests: Recent Labs  Lab 01/15/20 0333 01/16/20 0347  AST 13*  11*  ALT 8 7  ALKPHOS 45 39  BILITOT 0.4 0.3  PROT 4.4* 4.3*  ALBUMIN 1.8* 1.8*   No results for input(s):  LIPASE, AMYLASE in the last 168 hours. No results for input(s): AMMONIA in the last 168 hours. Coagulation Profile: No results for input(s): INR, PROTIME in the last 168 hours. Cardiac Enzymes: No results for input(s): CKTOTAL, CKMB, CKMBINDEX, TROPONINI in the last 168 hours. BNP (last 3 results) No results for input(s): PROBNP in the last 8760 hours. HbA1C: No results for input(s): HGBA1C in the last 72 hours. CBG: Recent Labs  Lab 01/17/20 1744 01/17/20 2355 01/18/20 0621 01/18/20 1147 01/18/20 1801  GLUCAP 121* 147* 116* 119* 96   Lipid Profile: No results for input(s): CHOL, HDL, LDLCALC, TRIG, CHOLHDL, LDLDIRECT in the last 72 hours. Thyroid Function Tests: No results for input(s): TSH, T4TOTAL, FREET4, T3FREE, THYROIDAB in the last 72 hours. Anemia Panel: No results for input(s): VITAMINB12, FOLATE, FERRITIN, TIBC, IRON, RETICCTPCT in the last 72 hours. Sepsis Labs: Recent Labs  Lab 01/14/20 1045 01/16/20 0347 01/18/20 0351  PROCALCITON 43.36 15.91 4.88  LATICACIDVEN 0.9  --   --     Recent Results (from the past 240 hour(s))  Surgical pcr screen     Status: None   Collection Time: 01/12/20 11:20 AM   Specimen: Nasal Mucosa; Nasal Swab  Result Value Ref Range Status   MRSA, PCR NEGATIVE NEGATIVE Final   Staphylococcus aureus NEGATIVE NEGATIVE Final    Comment: (NOTE) The Xpert SA Assay (FDA approved for NASAL specimens in patients 73 years of age and older), is one component of a comprehensive surveillance program. It is not intended to diagnose infection nor to guide or monitor treatment. Performed at [facility], [place] [address], [place], [place] [address]   Culture, Urine     Status: None   Collection Time: 01/14/20 11:50 AM   Specimen: Urine, Random  Result Value Ref Range Status   Specimen Description   Final    URINE, RANDOM Performed at [place] [address] [place], [place] [address]    Special  Requests   Final    NONE Performed at [facility], [place] [address] [place], [place] [address]    Culture   Final    NO GROWTH Performed at [facility], [place] [address], [place], [place] [address]    Report Status 01/16/2020 FINAL  Final  Culture, blood (routine x 2)     Status: Abnormal   Collection Time: 01/14/20 12:02 PM   Specimen: BLOOD  Result Value Ref Range Status   Specimen Description   Final    BLOOD Performed at [facility] [address], [place], [place] [address]    Special Requests   Final    BOTTLES DRAWN AEROBIC AND ANAEROBIC Blood Culture adequate volume Performed at [facility] [address] [place], [place] [address]    Culture  Setup Time   Final    ANAEROBIC BOTTLE ONLY GRAM POSITIVE RODS CRITICAL RESULT CALLED TO, READ BACK BY AND VERIFIED WITHKeturah Barre [facility] PHARMD 01/17/20 JDW Performed at [facility], [place] [address], [place],  [address]    Culture CLOSTRIDIUM PARAPUTRIFICUM (A)  Final   Report Status 01/20/2020 FINAL  Final  Culture, blood (routine x 2)     Status: None (Preliminary result)   Collection Time: 01/14/20 12:10 PM   Specimen: BLOOD  Result Value  Ref Range Status   Specimen Description   Final    BLOOD LEFT WRIST Performed at [facility], [address], [place], [place] [address]    Special Requests   Final    BOTTLES DRAWN AEROBIC AND ANAEROBIC Blood Culture adequate volume Performed at [facility], [address] [place], [place] [address]    Culture  Setup Time   Final    GRAM POSITIVE RODS ANAEROBIC BOTTLE ONLY CRITICAL VALUE NOTED.  VALUE IS CONSISTENT WITH PREVIOUSLY REPORTED AND CALLED VALUE.    Culture   Final    CULTURE REINCUBATED FOR BETTER GROWTH CORRECTED ON 03/25 AT 0840: PREVIOUSLY REPORTED AS NO GROWTH 5 DAYS Performed at [facility], [address], [place], [place] [address]    Report  Status PENDING  Incomplete  Culture, blood (routine x 2)     Status: None (Preliminary result)   Collection Time: 01/19/20  2:11 PM   Specimen: BLOOD LEFT FOREARM  Result Value Ref Range Status   Specimen Description   Final    BLOOD LEFT FOREARM Performed at [facility], [address] [place], [place] [address]    Special Requests   Final    BOTTLES DRAWN AEROBIC ONLY Blood Culture adequate volume Performed at [facility], [address], [place], [place] [address]    Culture   Final    NO GROWTH < 24 HOURS Performed at [facility], [address] [place], [place] [address]    Report Status PENDING  Incomplete  Culture, blood (routine x 2)     Status: None (Preliminary result)   Collection Time: 01/19/20  2:11 PM   Specimen: BLOOD LEFT FOREARM  Result Value Ref Range Status   Specimen Description   Final    BLOOD LEFT FOREARM Performed at [facility], [address], [place], [place] [address]    Special Requests   Final    BOTTLES DRAWN AEROBIC ONLY Blood Culture adequate volume Performed at [facility], [address], [place], [place] [address]    Culture   Final    NO GROWTH < 24 HOURS Performed at [facility], [address] [place], [place] [address]    Report Status PENDING  Incomplete      Radiology Studies: No results found.    Scheduled Meds: . Chlorhexidine Gluconate Cloth  6 each Topical Daily  . docusate sodium  100 mg Oral BID  . feeding supplement  1 Container Oral TID BM  . feeding supplement (PRO-STAT SUGAR FREE 64)  30 mL Oral BID  . ferrous sulfate  325 mg Oral Q breakfast  . folic acid  1 mg Oral Daily  . gabapentin  200 mg Oral TID  . methocarbamol  1,000 mg Oral TID  . metroNIDAZOLE  500 mg Oral Q8H  . multivitamin with minerals  1 tablet Oral Daily  . pantoprazole  40 mg Oral Daily  . polyethylene glycol  17 g Oral Daily  . sodium chloride  flush  10-40 mL Intracatheter Q12H   Continuous Infusions:    LOS: 10 days      Time spent: 25 minutes   Noralee Stain, DO [facility] 01/21/2020, 9:59 AM   Available via Epic secure chat 7am-7pm After these hours, please refer to coverage provider listed on [email or web address]

## 2020-01-22 LAB — BPAM RBC
Blood Product Expiration Date: 202104192359
Blood Product Expiration Date: 202104252359
Blood Product Expiration Date: 202104252359
ISSUE DATE / TIME: 202103241348
ISSUE DATE / TIME: 202103260843
ISSUE DATE / TIME: 202103261145
Unit Type and Rh: 7300
Unit Type and Rh: 7300
Unit Type and Rh: 7300

## 2020-01-22 LAB — BASIC METABOLIC PANEL
Anion gap: 8 (ref 5–15)
BUN: 10 mg/dL (ref 8–23)
CO2: 20 mmol/L — ABNORMAL LOW (ref 22–32)
Calcium: 8.2 mg/dL — ABNORMAL LOW (ref 8.9–10.3)
Chloride: 109 mmol/L (ref 98–111)
Creatinine, Ser: 0.62 mg/dL (ref 0.44–1.00)
GFR calc Af Amer: >60 mL/min (ref >60)
GFR calc non Af Amer: >60 mL/min (ref >60)
Glucose, Bld: 84 mg/dL (ref 70–99)
Potassium: 3.6 mmol/L (ref 3.5–5.1)
Sodium: 137 mmol/L (ref 135–145)

## 2020-01-22 LAB — TYPE AND SCREEN
ABO/RH(D): B POS
Antibody Screen: NEGATIVE
Unit division: 0
Unit division: 0
Unit division: 0

## 2020-01-22 LAB — CBC
HCT: 26.8 % — ABNORMAL LOW (ref 36.0–46.0)
Hemoglobin: 9 g/dL — ABNORMAL LOW (ref 12.0–15.0)
MCH: 34 pg (ref 26.0–34.0)
MCHC: 33.6 g/dL (ref 30.0–36.0)
MCV: 101.1 fL — ABNORMAL HIGH (ref 80.0–100.0)
Platelets: 334 10*3/uL (ref 150–400)
RBC: 2.65 MIL/uL — ABNORMAL LOW (ref 3.87–5.11)
RDW: 15.9 % — ABNORMAL HIGH (ref 11.5–15.5)
WBC: 13.2 10*3/uL — ABNORMAL HIGH (ref 4.0–10.5)
nRBC: 0 % (ref 0.0–0.2)

## 2020-01-22 LAB — MAGNESIUM: Magnesium: 2.1 mg/dL (ref 1.7–2.4)

## 2020-01-22 LAB — CULTURE, BLOOD (ROUTINE X 2): Special Requests: ADEQUATE

## 2020-01-22 NOTE — Progress Notes (Signed)
10 Days Post-Op   Subjective/Chief Complaint: No acute changes overnight Abdominal pain about the same Tolerating po   Objective: Vital signs in last 24 hours: Temp:  [98.3 F (36.8 C)-98.7 F (37.1 C)] 98.4 F (36.9 C) (03/28 0649) Pulse Rate:  [85-95] 85 (03/28 0649) Resp:  [14-17] 14 (03/28 0649) BP: (100-132)/(43-58) 116/48 (03/28 0649) SpO2:  [100 %] 100 % (03/28 0649) Last BM Date: 01/18/20  Intake/Output from previous day: 03/27 0701 - 03/28 0700 In: 1010 [P.O.:1010] Out: -  Intake/Output this shift: No intake/output data recorded.  Exam: Awake and alert Abdomen soft, mildly tender, open wound clean with good granulation tissue  Lab Results:  Recent Labs    01/21/20 0510 01/22/20 0447  WBC 13.4* 13.2*  HGB 9.3* 9.0*  HCT 27.6* 26.8*  PLT 234 334   BMET Recent Labs    01/20/20 0453 01/21/20 0510  NA 134* 139  K 4.1 3.9  CL 107 110  CO2 22 21*  GLUCOSE 79 67*  BUN 15 11  CREATININE 0.59 0.59  CALCIUM 8.1* 8.4*   PT/INR No results for input(s): LABPROT, INR in the last 72 hours. ABG No results for input(s): PHART, HCO3 in the last 72 hours.  Invalid input(s): PCO2, PO2  Studies/Results: No results found.  Anti-infectives: Anti-infectives (From admission, onward)   Start     Dose/Rate Route Frequency Ordered Stop   01/19/20 1700  cefUROXime (CEFTIN) tablet 500 mg  Status:  Discontinued     500 mg Oral 2 times daily with meals 01/19/20 1352 01/20/20 1452   01/19/20 1430  metroNIDAZOLE (FLAGYL) tablet 500 mg     500 mg Oral Every 8 hours 01/19/20 1352 01/26/20 1359   01/14/20 1400  metroNIDAZOLE (FLAGYL) IVPB 500 mg  Status:  Discontinued     500 mg 100 mL/hr over 60 Minutes Intravenous Every 8 hours 01/14/20 1203 01/17/20 0830   01/14/20 1300  ciprofloxacin (CIPRO) IVPB 400 mg  Status:  Discontinued     400 mg 200 mL/hr over 60 Minutes Intravenous Every 12 hours 01/14/20 1203 01/17/20 0830   01/13/20 0600  cefoTEtan (CEFOTAN) 2 g in  sodium chloride 0.9 % 100 mL IVPB  Status:  Discontinued     2 g 200 mL/hr over 30 Minutes Intravenous On call to O.R. 01/12/20 1749 01/12/20 1903   01/12/20 0600  cefoTEtan (CEFOTAN) 2 g in sodium chloride 0.9 % 100 mL IVPB     2 g 200 mL/hr over 30 Minutes Intravenous On call to O.R. 01/11/20 1022 01/12/20 1213      Assessment/Plan: s/p Procedure(s): EXPLORATORY LAPAROTOMY LYSIS OF ADHESIONS AND SMALL BOWEL OBSTRUCTION (N/A)   Chronic abdominal pain.  At base line Hgb stable this morning WBC still about the same on IV antibiotics per ID Continue to encourage PO Wound care    Abigail Miyamoto MD 01/22/2020

## 2020-01-22 NOTE — Progress Notes (Signed)
Patient found with chewing tobacco, informed her that this was against hospital policy and she could not have it in her room. Patient spit out tobacco and remaining tobacco was placed at nurses station

## 2020-01-22 NOTE — Progress Notes (Signed)
PROGRESS NOTE    Samantha Gonzales  QDU:[id number] DOB: 22-Jul-1941 DOA: 01/11/2020 PCP: System, Pcp Not In     Brief Narrative:  Samantha Gonzales is a 79 y.o. female with a history of HTN, HLD, mild mitral regurgitation who presented to the hospital with abdominal pain and vomiting. 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 Luisa Hart. TR[facility] was consulted initially on 3/17 for medical clearance for surgery. Cardiology was also consulted on 3/21 as well. Blood culture positive for clostridium paraputrificum, treatment with flagyl started after discussing with ID.   New events last 24 hours / Subjective: Awaiting her coffee this morning. Displeased about having telesitter in room.   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 Discussed with ID. 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 No high risk features. Recommended BP control. No other recommendations at this time    Medically stable for discharge once cleared by  primary service. Pain control remains issue. Can discharge with PO flagyl to complete 7 day treatment 3/25-4/1.   Antimicrobials:  Anti-infectives (From admission, onward)   Start     Dose/Rate Route Frequency Ordered Stop   01/19/20 1700  cefUROXime (CEFTIN) tablet 500 mg  Status:  Discontinued     500 mg Oral 2 times daily with meals 01/19/20 1352 01/20/20 1452   01/19/20 1430  metroNIDAZOLE (FLAGYL) tablet 500 mg     500 mg Oral Every 8 hours 01/19/20 1352 01/26/20 1359   01/14/20 1400  metroNIDAZOLE (FLAGYL) IVPB 500 mg  Status:  Discontinued     500 mg 100 mL/hr over 60 Minutes Intravenous Every 8 hours 01/14/20 1203 01/17/20 0830   01/14/20 1300  ciprofloxacin (CIPRO) IVPB 400 mg  Status:  Discontinued     400 mg 200 mL/hr over 60 Minutes Intravenous Every 12 hours 01/14/20 1203 01/17/20 0830   01/13/20 0600  cefoTEtan (CEFOTAN) 2 g in sodium chloride 0.9 % 100 mL IVPB  Status:  Discontinued     2 g 200 mL/hr over 30 Minutes Intravenous On call to O.R. 01/12/20 1749 01/12/20 1903   01/12/20 0600  cefoTEtan (CEFOTAN) 2 g in sodium chloride 0.9 % 100 mL IVPB     2 g 200 mL/hr over 30 Minutes Intravenous On call to O.R. 01/11/20 1022 01/12/20 1213       Objective: Vitals:   01/21/20 1300 01/21/20 1311 01/21/20 2257 01/22/20 0649  BP: (!) 132/58 (!) 125/49 (!) 100/43 (!) 116/48  Pulse: 89 88 95 85  Resp:  17 17 16 14   Temp: 98.4 F (36.9 C) 98.3 F (36.8 C) 98.7 F (37.1 C) 98.4 F (36.9 C)  TempSrc: Oral  Oral Oral  SpO2: 100% 100% 100% 100%  Weight:      Height:        Intake/Output Summary (Last 24 hours) at 01/22/2020 0937 Last data filed at 01/22/2020 0649 Gross per 24 hour  Intake 1010 ml  Output --  Net 1010 ml   Filed Weights   01/11/20 0317 01/13/20 1132 01/20/20 1729  Weight: 72.6 kg 55.7 kg 57.8 kg    Examination: General exam: Appears calm and comfortable  Respiratory system: Clear to auscultation. Respiratory effort normal. Cardiovascular system:  S1 & S2 heard, RRR. No pedal edema. Gastrointestinal system: Abdomen is nondistended, soft and nontender. Normal bowel sounds heard. Extremities: Symmetric in appearance bilaterally  Skin: No rashes, lesions or ulcers on exposed skin  Psychiatry: Judgement and insight appear poor    Data Reviewed: I have personally reviewed following labs and imaging studies  CBC: Recent Labs  Lab 01/16/20 0347 01/16/20 1621 01/18/20 0827 01/18/20 1903 01/19/20 0300 01/20/20 0453 01/20/20 2047 01/21/20 0510 01/22/20 0447  WBC 7.2   < > 5.3  --  6.4 9.1  --  13.4* 13.2*  NEUTROABS 6.3  --   --   --   --   --   --   --   --   HGB 7.9*   < > 7.1*   < > 7.4* 6.7* 9.4* 9.3* 9.0*  HCT 24.6*   < > 22.0*   < > 22.4* 20.2* 28.0* 27.6* 26.8*  MCV 106.0*   < > 106.8*  --  104.2* 105.2*  --  100.7* 101.1*  PLT 85*   < > 107*  --  112* 161  --  234 334   < > = values in this interval not displayed.   Basic Metabolic Panel: Recent Labs  Lab 01/16/20 0347 01/16/20 0347 01/17/20 0309 01/18/20 0351 01/19/20 0300 01/20/20 0453 01/21/20 0510  NA 141   < > 139 138 137 134* 139  K 3.5   < > 3.7 4.4 4.5 4.1 3.9  CL 104   < > 103 107 105 107 110  CO2 31   < > 30 27 26 22  21*  GLUCOSE 106*   < > 93 107* 77 79 67*  BUN 20   < > 18 19 18 15 11   CREATININE 0.55   < > 0.44 0.46 0.49 0.59 0.59  CALCIUM 8.3*   < > 8.2* 8.4* 8.3* 8.1* 8.4*  MG 2.1  --  2.0 1.7 1.9 1.8  --   PHOS 2.7  --  2.8 2.5  --   --   --    < > = values in this interval not displayed.   GFR: Estimated Creatinine Clearance: 54.6 mL/min (by C-G formula based on SCr of 0.59 mg/dL). Liver Function Tests: Recent Labs  Lab 01/16/20 0347  AST 11*  ALT 7  ALKPHOS 39  BILITOT 0.3  PROT 4.3*  ALBUMIN 1.8*   No results for input(s): LIPASE, AMYLASE in the last 168 hours. No results for input(s): AMMONIA in the last 168 hours. Coagulation Profile: No results for input(s): INR, PROTIME in the last 168 hours. Cardiac Enzymes: No results  for input(s): CKTOTAL, CKMB, CKMBINDEX, TROPONINI in the last 168 hours. BNP (last 3 results) No results for input(s): PROBNP in the last 8760 hours. HbA1C: No results for  input(s): HGBA1C in the last 72 hours. CBG: Recent Labs  Lab 01/17/20 1744 01/17/20 2355 01/18/20 0621 01/18/20 1147 01/18/20 1801  GLUCAP 121* 147* 116* 119* 96   Lipid Profile: No results for input(s): CHOL, HDL, LDLCALC, TRIG, CHOLHDL, LDLDIRECT in the last 72 hours. Thyroid Function Tests: No results for input(s): TSH, T4TOTAL, FREET4, T3FREE, THYROIDAB in the last 72 hours. Anemia Panel: No results for input(s): VITAMINB12, FOLATE, FERRITIN, TIBC, IRON, RETICCTPCT in the last 72 hours. Sepsis Labs: Recent Labs  Lab 01/16/20 0347 01/18/20 0351  PROCALCITON 15.91 4.88    Recent Results (from the past 240 hour(s))  Surgical pcr screen     Status: None   Collection Time: 01/12/20 11:20 AM   Specimen: Nasal Mucosa; Nasal Swab  Result Value Ref Range Status   MRSA, PCR NEGATIVE NEGATIVE Final   Staphylococcus aureus NEGATIVE NEGATIVE Final    Comment: (NOTE) The Xpert SA Assay (FDA approved for NASAL specimens in patients 34 years of age and older), is one component of a comprehensive surveillance program. It is not intended to diagnose infection nor to guide or monitor treatment. Performed at [facility], [address], [place], [place] [address]   Culture, Urine     Status: None   Collection Time: 01/14/20 11:50 AM   Specimen: Urine, Random  Result Value Ref Range Status   Specimen Description   Final    URINE, RANDOM Performed at [facility], [address] [place], [place] [address]    Special Requests   Final    NONE Performed at [facility], [address] [place], [place] [address]    Culture   Final    NO GROWTH Performed at [facility], [address], [place], [place] [address]    Report Status 01/16/2020 FINAL   Final  Culture, blood (routine x 2)     Status: Abnormal   Collection Time: 01/14/20 12:02 PM   Specimen: BLOOD  Result Value Ref Range Status   Specimen Description   Final    BLOOD Performed at [facility], [address] [place], [place] [address]    Special Requests   Final    BOTTLES DRAWN AEROBIC AND ANAEROBIC Blood Culture adequate volume Performed at [facility], [address], [place], [place] [address]    Culture  Setup Time   Final    ANAEROBIC BOTTLE ONLY GRAM POSITIVE RODS CRITICAL RESULT CALLED TO, READ BACK BY AND VERIFIED WITHAlgis Downs [facility] PHARMD 01/17/20 JDW Performed at [facility], [address], [place], [place] [address]    Culture CLOSTRIDIUM PARAPUTRIFICUM (A)  Final   Report Status 01/20/2020 FINAL  Final  Culture, blood (routine x 2)     Status: None (Preliminary result)   Collection Time: 01/14/20 12:10 PM   Specimen: BLOOD  Result Value Ref Range Status   Specimen Description   Final    BLOOD LEFT WRIST Performed at [facility], [address], [place], [place] [address]    Special Requests   Final    BOTTLES DRAWN AEROBIC AND ANAEROBIC Blood Culture adequate volume Performed at [facility], [address], [place], [place] [address]    Culture  Setup Time GRAM POSITIVE RODS ANAEROBIC BOTTLE ONLY   Final   Culture   Final    EGGERTHIA CATENAFORMIS Performed at [facility], [address], [place], [place] [address]  Report Status PENDING  Incomplete  Culture, blood (routine x 2)     Status: None (Preliminary result)   Collection Time: 01/19/20  2:11 PM   Specimen: BLOOD LEFT FOREARM  Result Value Ref Range Status   Specimen Description   Final    BLOOD LEFT FOREARM Performed at [facility] [address], [place], [place] [address]    Special Requests   Final    BOTTLES DRAWN AEROBIC ONLY Blood Culture adequate  volume Performed at [facility] [address], [place], [place] [address]    Culture   Final    NO GROWTH 3 DAYS Performed at [facility], [place] [address] [place], [place] [address]    Report Status PENDING  Incomplete  Culture, blood (routine x 2)     Status: None (Preliminary result)   Collection Time: 01/19/20  2:11 PM   Specimen: BLOOD LEFT FOREARM  Result Value Ref Range Status   Specimen Description   Final    BLOOD LEFT FOREARM Performed at [facility] [address], [place], [place] [address]    Special Requests   Final    BOTTLES DRAWN AEROBIC ONLY Blood Culture adequate volume Performed at [facility] [address], [place], [place] [address]    Culture   Final    NO GROWTH 3 DAYS Performed at [facility], [place] [address], [place], [place] [address]    Report Status PENDING  Incomplete      Radiology Studies: No results found.    Scheduled Meds: . Chlorhexidine Gluconate Cloth  6 each Topical Daily  . docusate sodium  100 mg Oral BID  . feeding supplement  1 Container Oral TID BM  . feeding supplement (PRO-STAT SUGAR FREE 64)  30 mL Oral BID  . ferrous sulfate  325 mg Oral Q breakfast  . folic acid  1 mg Oral Daily  . gabapentin  200 mg Oral TID  . methocarbamol  1,000 mg Oral TID  . metroNIDAZOLE  500 mg Oral Q8H  . multivitamin with minerals  1 tablet Oral Daily  . pantoprazole  40 mg Oral Daily  . polyethylene glycol  17 g Oral Daily  . sodium chloride flush  10-40 mL Intracatheter Q12H   Continuous Infusions:    LOS: 11 days      Time spent: 20 minutes   Dessa Phi, DO [facility] 01/22/2020, 9:37 AM   Available via Epic secure chat 7am-7pm After these hours, please refer to coverage provider listed on [email or web address]

## 2020-01-22 NOTE — Progress Notes (Signed)
PHARMACY - PHYSICIAN COMMUNICATION CRITICAL VALUE ALERT - BLOOD CULTURE IDENTIFICATION (BCID)  Samantha Gonzales is an 79 y.o. female who presented to [facility] on 01/11/2020 with a chief complaint of SBO  Assessment from 3/23:  GPR in 1/4 BCx bottles (anaerobe); source unknown but possibly contaminant given such late growth in anaerobic bottle from common contaminant group. Patient completed 4 days of Cipro/Flagyl today; demonstrated no fevers or leukocytosis, and appears otherwise clinically stable/improved per [facility] notes  Name of physician (or Provider) Contacted: Gerkin  Current antibiotics: none  Changes to prescribed antibiotics recommended: monitor off abx Recommendations accepted by provider  No results found for this or any previous visit.   Addendum 3/28:  Organism identified as Eggerthia catenaformis (renamed from Lactobacillus catenaformis)  Chilton Si, Crystal Ellwood L 01/22/2020  4:08 PM

## 2020-01-23 LAB — CBC
HCT: 27.1 % — ABNORMAL LOW (ref 36.0–46.0)
Hemoglobin: 8.8 g/dL — ABNORMAL LOW (ref 12.0–15.0)
MCH: 33.1 pg (ref 26.0–34.0)
MCHC: 32.5 g/dL (ref 30.0–36.0)
MCV: 101.9 fL — ABNORMAL HIGH (ref 80.0–100.0)
Platelets: 447 10*3/uL — ABNORMAL HIGH (ref 150–400)
RBC: 2.66 MIL/uL — ABNORMAL LOW (ref 3.87–5.11)
RDW: 15.7 % — ABNORMAL HIGH (ref 11.5–15.5)
WBC: 12.1 10*3/uL — ABNORMAL HIGH (ref 4.0–10.5)
nRBC: 0 % (ref 0.0–0.2)

## 2020-01-23 MED ORDER — METRONIDAZOLE 500 MG PO TABS: 500.0000 mg | ORAL_TABLET | Freq: Three times a day (TID) | ORAL | 0 refills | Status: DC

## 2020-01-23 MED ORDER — IBUPROFEN 200 MG PO TABS: ORAL_TABLET | ORAL | Status: DC

## 2020-01-23 MED ORDER — GABAPENTIN 100 MG PO CAPS: 200.0000 mg | ORAL_CAPSULE | Freq: Three times a day (TID) | ORAL | 0 refills | Status: DC

## 2020-01-23 MED ORDER — HYDROCODONE-ACETAMINOPHEN 10-325 MG PO TABS: 1.0000 | ORAL_TABLET | Freq: Four times a day (QID) | ORAL | 0 refills | Status: DC | PRN

## 2020-01-23 NOTE — Discharge Instructions (Signed)
[facility], [place] [phone]  OPEN ABDOMINAL SURGERY: POST OP INSTRUCTIONS  Always review your discharge instruction sheet given to you by the facility where your surgery was performed.  IF YOU HAVE DISABILITY OR FAMILY LEAVE FORMS, YOU MUST BRING THEM TO THE OFFICE FOR PROCESSING.  PLEASE DO NOT GIVE THEM TO YOUR DOCTOR.  1. A prescription for pain medication may be given to you upon discharge.  Take your pain medication as prescribed, if needed.  If narcotic pain medicine is not needed, then you may take acetaminophen (Tylenol) or ibuprofen (Advil) as needed. 2. Take your usually prescribed medications unless otherwise directed. 3. If you need a refill on your pain medication, please contact your pharmacy. They will contact our office to request authorization.  Prescriptions will not be filled after 5pm or on week-ends. 4. You should follow a light diet the first few days after arrival home, such as soup and crackers, pudding, etc.unless your doctor has advised otherwise. A high-fiber, low fat diet can be resumed as tolerated.   Be sure to include lots of fluids daily. Most patients will experience some swelling and bruising on the chest and neck area.  Ice packs will help.  Swelling and bruising can take several days to resolve 5. Most patients will experience some swelling and bruising in the area of the incision. Ice pack will help. Swelling and bruising can take several days to resolve..  6. It is common to experience some constipation if taking pain medication after surgery.  Increasing fluid intake and taking a stool softener will usually help or prevent this problem from occurring.  A mild laxative (Milk of Magnesia or Miralax) should be taken according to package directions if there are no bowel movements after 48 hours. 7.  You may have steri-strips (small skin tapes) in place directly over the incision.  These strips should be left on the skin for 7-10 days.  If your  surgeon used skin glue on the incision, you may shower in 24 hours.  The glue will flake off over the next 2-3 weeks.  Any sutures or staples will be removed at the office during your follow-up visit. You may find that a light gauze bandage over your incision may keep your staples from being rubbed or pulled. You may shower and replace the bandage daily. 8. ACTIVITIES:  You may resume regular (light) daily activities beginning the next day--such as daily self-care, walking, climbing stairs--gradually increasing activities as tolerated.  You may have sexual intercourse when it is comfortable.  Refrain from any heavy lifting or straining until approved by your doctor. a. You may drive when you no longer are taking prescription pain medication, you can comfortably wear a seatbelt, and you can safely maneuver your car and apply brakes  9. You should see your doctor in the office for a follow-up appointment approximately two weeks after your surgery.  Make sure that you call for this appointment within a day or two after you arrive home to insure a convenient appointment time.   WHEN TO CALL YOUR DOCTOR: 1. Fever over 101.0 2. Inability to urinate 3. Nausea and/or vomiting 4. Extreme swelling or bruising 5. Continued bleeding from incision. 6. Increased pain, redness, or drainage from the incision. 7. Difficulty swallowing or breathing 8. Muscle cramping or spasms. 9. Numbness or tingling in hands or feet or around lips.  The clinic staff is available to answer your questions during regular business hours.  Please don't hesitate to call and ask to speak to one of the nurses if you have concerns.  For further questions, please visit [email or web address]  MIDLINE WOUND CARE: - midline dressing to be changed twice daily - supplies: sterile saline, gauze, scissors, ABD pads, tape  - remove dressing and all packing carefully, moistening with sterile saline as needed to avoid packing/internal  dressing sticking to the wound. - clean edges of skin around the wound with water/gauze, making sure there is no tape debris or leakage left on skin that could cause skin irritation or breakdown. - dampen clean gauze with sterile saline and pack wound from wound base to skin level, making sure to take note of any possible areas of wound tracking, tunneling and packing appropriately. Wound can be packed loosely.  - cover wound with a dry ABD pad and secure with tape.  - write the date/time on the dry dressing/tape to better track when the last dressing change occurred. - apply any skin protectant/powder recommended by clinician to protect skin/skin folds. - change dressing as needed if leakage occurs, wound gets contaminated, or patient requests to shower. - patient may shower daily with wound open and following the shower the wound should be dried and a clean dressing placed.    High-Protein and High-Calorie Diet Eating high-protein and high-calorie foods can help you to gain weight, heal after an injury, and recover after an illness or surgery. The specific amount of daily protein and calories you need depends on:  Your body weight.  The reason this diet is recommended for you. What is my plan? Generally, a high-protein, high-calorie diet involves:  Eating 250-500 extra calories each day.  Making sure that you get enough of your daily calories from protein. Ask your health care provider how many of your calories should come from protein. Talk with a health care provider, such as a diet and nutrition specialist (dietitian), about how much protein and how many calories you need each day. Follow the diet as directed by your health care provider. What are tips for following this plan?  Preparing meals  Add whole milk, half-and-half, or heavy cream to cereal, pudding, soup, or hot cocoa.  Add whole milk to instant breakfast drinks.  Add peanut butter to oatmeal or smoothies.  Add  powdered milk to baked goods, smoothies, or milkshakes.  Add powdered milk, cream, or butter to mashed potatoes.  Add cheese to cooked vegetables.  Make whole-milk yogurt parfaits. Top them with granola, fruit, or nuts.  Add cottage cheese to your fruit.  Add avocado, cheese, or both to sandwiches or salads.  Add meat, poultry, or seafood to rice, pasta, casseroles, salads, and soups.  Use mayonnaise when making egg salad, chicken salad, or tuna salad.  Use peanut butter as a dip for vegetables or as a topping for pretzels, celery, or crackers.  Add beans to casseroles, dips, and spreads.  Add pureed beans to sauces and soups.  Replace calorie-free drinks with calorie-containing drinks, such as milk and fruit juice.  Replace water with milk or heavy cream when making foods such as oatmeal, pudding, or cocoa. General instructions  Ask your health care provider if you should take a nutritional supplement.  Try to eat six small meals each day instead of three large meals.  Eat a balanced diet. In each meal, include one food that is high in protein.  Keep nutritious snacks available, such as nuts, trail mixes, dried fruit, and yogurt.  If you have kidney  disease or diabetes, talk with your health care provider about how much protein is safe for you. Too much protein may put extra stress on your kidneys.  Drink your calories. Choose high-calorie drinks and have them after your meals. What high-protein foods should I eat?  Vegetables Soybeans. Peas. Grains Quinoa. Bulgur wheat. Meats and other proteins Beef, pork, and poultry. Fish and seafood. Eggs. Tofu. Textured vegetable protein (TVP). Peanut butter. Nuts and seeds. Dried beans. Protein powders. Dairy Whole milk. Whole-milk yogurt. Powdered milk. Cheese. [employer]. Eggnog. Beverages High-protein supplement drinks. Soy milk. Other foods Protein bars. The items listed above may not be a complete list of  high-protein foods and beverages. Contact a dietitian for more options. What high-calorie foods should I eat? Fruits Dried fruit. Fruit leather. Canned fruit in syrup. Fruit juice. Avocado. Vegetables Vegetables cooked in oil or butter. Fried potatoes. Grains Pasta. Quick breads. Muffins. Pancakes. Ready-to-eat cereal. Meats and other proteins Peanut butter. Nuts and seeds. Dairy Heavy cream. Whipped cream. Cream cheese. Sour cream. Ice cream. Custard. Pudding. Beverages Meal-replacement beverages. Nutrition shakes. Fruit juice. Sugar-sweetened soft drinks. Seasonings and condiments Salad dressing. Mayonnaise. Alfredo sauce. Fruit preserves or jelly. Honey. Syrup. Sweets and desserts Cake. Cookies. Pie. Pastries. Candy bars. Chocolate. Fats and oils Butter or margarine. Oil. Gravy. Other foods Meal-replacement bars. The items listed above may not be a complete list of high-calorie foods and beverages. Contact a dietitian for more options. Summary  A high-protein, high-calorie diet can help you gain weight or heal faster after an injury, illness, or surgery.  To increase your protein and calories, add ingredients such as whole milk, peanut butter, cheese, beans, meat, or seafood to meal items.  To get enough extra calories each day, include high-calorie foods and beverages at each meal.  Adding a high-calorie drink or shake can be an easy way to help you get enough calories each day. Talk with your healthcare provider or dietitian about the best options for you. This information is not intended to replace advice given to you by your health care provider. Make sure you discuss any questions you have with your health care provider. Document Revised: 09/25/2017 Document Reviewed: 08/25/2017 Elsevier Patient Education  2020 [employer]   Mechanical Wound Debridement You can clean the open wound with plain soap and water.  She can shower with the wound open.  After the site is  cleaned, place a wet 4 x 4 dressing with in the open wound.  Be sure to keep the wet portion on only the open area of the wound.  Apply dry sterile dressing on top of this. You can do this twice a day until it is healed. Mechanical wound debridement is a treatment to remove dead tissue from a wound. This helps the wound heal. The treatment involves cleaning the wound by using mechanical force. This may include:  Wound irrigation. This method involves rinsing the wound out with fluid.  Wet-to-dry dressing. This method involves putting wet dressings into the wound, allowing them to dry, then removing the dry dressings to remove debris and tissue from the wound.  Using a pad or gauze to remove dead tissue and debris from the wound.  Pulsatile lavage. This method involves cleaning the wound with a pressurized stream of fluid. Depending on the wound, you may need to repeat this procedure or change to another form of debridement as your wound starts to heal. Tell a health care provider about:  Any allergies you have.  All medicines  you are taking, including vitamins, herbs, eye drops, creams, and over-the-counter medicines.  Any blood disorders you have.  Any medical conditions you have, or have had, especially conditions that cause a decrease in blood flow to the wound area, such as peripheral vascular disease, or conditions that affect your body's defense (immune) system or white blood count.  Any surgeries you have had.  Whether you are pregnant or may be pregnant. What are the risks? Generally, this is a safe procedure. However, problems may occur, including:  Infection.  Bleeding.  Damage to healthy tissue in and around your wound.  Soreness or pain.  Failure of the wound to heal.  Scarring. What happens during the procedure?   Your health care provider may apply a numbing medicine (topical anesthetic) to the wound.  Your health care provider may irrigate your wound with a  germ-free salt solution (saline). This helps to loosen or remove debris, bacteria, and dead tissue.  Depending on the type of mechanical wound debridement you are having, your health care provider may do one of the following: ? Put a dressing on your wound. You may have a dry gauze pad placed into the wound. Your health care provider will remove the gauze after the wound is dry. Any dead tissue and debris that has dried into the gauze will be lifted out of the wound (wet-to-dry debridement). ? Use a type of pad (monofilament fiber debridement pad) that has a fluffy surface on one side that picks up dead tissue and debris from your wound. Your health care provider wets the pad and wipes it over your wound for several minutes. ? Flush (irrigate) your wound with a pressurized stream of solution such as saline or water (pulsatile lavage).  Once your health care provider is finished, he or she may apply a light dressing to your wound. The procedure may vary among health care providers and hospitals. What happens after the procedure?  You may get medicine for pain. Summary  Mechanical wound debridement is a treatment to remove dead tissue from a wound. This helps the wound heal.  Depending on the wound, you may need to repeat this procedure or change to another form of debridement as your wound starts to heal.  This treatment may include: wound irrigation, wet-to-dry dressing, using a pad or gauze to remove dead tissue and debris from the wound, or pulsatile lavage. This information is not intended to replace advice given to you by your health care provider. Make sure you discuss any questions you have with your health care provider. Document Revised: 09/20/2018 Document Reviewed: 09/20/2018 Elsevier Patient Education  2020 [employer].

## 2020-01-23 NOTE — Progress Notes (Signed)
Occupational Therapy Treatment Patient Details Name: Samantha Gonzales MRN: [id number] DOB: 1941/02/13 Today's Date: 01/23/2020    History of present illness Admitted with Small Bowel Obstruction and underwent Exploratory Lap on 01/12/2020- Had come in with abdominal pain and vomiting. Has had agitation including pulling IV out- PMH includes essential HTN, possible hypertrophic cardiomyopathy, HLD, AKI, hypomagnesemia, hypophosphateemia, cognitive impairment, and tachycardia.,   OT comments  Patient requires max encouragement for out of bed activity, reporting pain in abdomen however reassure patient she received pain medication prior to treatment and that movement will progress the healing process. Patient requires min A for bed mobility and functional transfers with walker. Poor safety awareness requiring cues for body mechanics. Patient is distractible with difficulty sustaining attention during self care tasks. Will continue to follow.   Follow Up Recommendations  Home health OT;Supervision/Assistance - 24 hour    Equipment Recommendations  None recommended by OT       Precautions / Restrictions Precautions Precautions: Fall Precaution Comments: cognition Restrictions Weight Bearing Restrictions: No       Mobility Bed Mobility Overal bed mobility: Needs Assistance Bed Mobility: Rolling;Sidelying to Sit Rolling: Min assist Sidelying to sit: Min assist;HOB elevated       General bed mobility comments: max cues for log roll technique and min A for carry over of instructions  Transfers Overall transfer level: Needs assistance Equipment used: Rolling walker (2 wheeled);None Transfers: Sit to/from Stand Sit to Stand: Min assist         General transfer comment: poor safety awareness, cues for body mechanics    Balance Overall balance assessment: Needs assistance Sitting-balance support: Feet supported;No upper extremity supported Sitting balance-Leahy Scale: Fair      Standing balance support: Bilateral upper extremity supported;During functional activity Standing balance-Leahy Scale: Poor Standing balance comment: requires external support during dynamic standing                           ADL either performed or assessed with clinical judgement   ADL Overall ADL's : Needs assistance/impaired                         Toilet Transfer: Minimal assistance;Ambulation;RW;BSC Toilet Transfer Details (indicate cue type and reason): poor safety with cues for body mechanics Toileting- Clothing Manipulation and Hygiene: Supervision/safety;Sit to/from stand Toileting - Clothing Manipulation Details (indicate cue type and reason): for peri care in sitting after voiding     Functional mobility during ADLs: Minimal assistance;Rolling walker;Cueing for safety General ADL Comments: patient requires increased assist for self care due to limited activity tolerance, balance, safety awareness, and pain               Cognition Arousal/Alertness: Awake/alert Behavior During Therapy: Flat affect Overall Cognitive Status: No family/caregiver present to determine baseline cognitive functioning Area of Impairment: Attention;Following commands;Safety/judgement;Awareness;Problem solving                   Current Attention Level: Sustained   Following Commands: Follows one step commands with increased time Safety/Judgement: Decreased awareness of deficits;Decreased awareness of safety Awareness: Intellectual Problem Solving: Requires verbal cues;Requires tactile cues;Difficulty sequencing General Comments: pt frequently leaning forward with minimal engagement requiring cues to redirect to task                   Pertinent Vitals/ Pain       Pain Assessment: Faces Faces Pain Scale: Hurts little more  Pain Location: abdomen Pain Descriptors / Indicators: Sore Pain Intervention(s): Premedicated before session;Monitored during  session         Frequency  Min 2X/week        Progress Toward Goals  OT Goals(current goals can now be found in the care plan section)  Progress towards OT goals: Progressing toward goals  Acute Rehab OT Goals Patient Stated Goal: decrease pain OT Goal Formulation: With patient Time For Goal Achievement: 02/01/20 Potential to Achieve Goals: Fair ADL Goals Pt Will Perform Grooming: sitting;with set-up Pt Will Perform Lower Body Dressing: with supervision;sit to/from stand Pt Will Transfer to Toilet: bedside commode;with min guard assist Pt Will Perform Toileting - Clothing Manipulation and hygiene: with min guard assist Pt Will Perform Tub/Shower Transfer: Tub transfer;with supervision;ambulating;3 in 1 Pt/caregiver will Perform Home Exercise Program: Increased strength;Both right and left upper extremity;With written HEP provided;With Supervision Additional ADL Goal #1: Patient will demonstrate emergent awareness during ADL routine.  Plan Discharge plan remains appropriate;Frequency remains appropriate       AM-PAC OT "6 Clicks" Daily Activity     Outcome Measure   Help from another person eating meals?: A Little Help from another person taking care of personal grooming?: A Little Help from another person toileting, which includes using toliet, bedpan, or urinal?: A Lot Help from another person bathing (including washing, rinsing, drying)?: A Lot Help from another person to put on and taking off regular upper body clothing?: A Little Help from another person to put on and taking off regular lower body clothing?: A Lot 6 Click Score: 15    End of Session Equipment Utilized During Treatment: Rolling walker  OT Visit Diagnosis: Unsteadiness on feet (R26.81);Muscle weakness (generalized) (M62.81) Pain - part of body: (abdomen)   Activity Tolerance No increased pain   Patient Left in chair;with call bell/phone within reach;with chair alarm set;Other  (comment)(telesitter)           Time: 4259-5638 OT Time Calculation (min): 17 min  Charges: OT General Charges $OT Visit: 1 Visit OT Treatments $Self Care/Home Management : 8-22 mins  Myrtie Neither OT OT office: [phone]   Carmelia Roller 01/23/2020, 12:29 PM

## 2020-01-23 NOTE — Progress Notes (Signed)
PROGRESS NOTE    Samantha Gonzales  KCL:[id number] DOB: [birth year] DOA: 01/11/2020 PCP: System, Pcp Not In     Brief Narrative:  Samantha Gonzales is a 79 y.o. female with a history of HTN, HLD, mild mitral regurgitation who presented to the hospital with abdominal pain and vomiting. 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 Luisa Hart. TRH was consulted initially on 3/17 for medical clearance for surgery. Cardiology was also consulted on 3/21 as well. Blood culture positive for clostridium paraputrificum, treatment with flagyl started after discussing with ID.   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 No high risk features. Recommended BP control. No other recommendations at this time    Medically stable for discharge  once cleared by primary service. Can discharge with PO flagyl to complete 7 day treatment 3/25-4/1. Will sign off today. Please call with any questions.   Antimicrobials:  Anti-infectives (From admission, onward)   Start     Dose/Rate Route Frequency Ordered Stop   01/19/20 1700  cefUROXime (CEFTIN) tablet 500 mg  Status:  Discontinued     500 mg Oral 2 times daily with meals 01/19/20 1352 01/20/20 1452   01/19/20 1430  metroNIDAZOLE (FLAGYL) tablet 500 mg     500 mg Oral Every 8 hours 01/19/20 1352 01/26/20 1359   01/14/20 1400  metroNIDAZOLE (FLAGYL) IVPB 500 mg  Status:  Discontinued     500 mg 100 mL/hr over 60 Minutes Intravenous Every 8 hours 01/14/20 1203 01/17/20 0830   01/14/20 1300  ciprofloxacin (CIPRO) IVPB 400 mg  Status:  Discontinued     400 mg 200 mL/hr over 60 Minutes Intravenous Every 12 hours 01/14/20 1203 01/17/20 0830   01/13/20 0600  cefoTEtan (CEFOTAN) 2 g in sodium chloride 0.9 % 100 mL IVPB  Status:  Discontinued     2 g 200 mL/hr over 30 Minutes Intravenous On call to O.R. 01/12/20 1749 01/12/20 1903   01/12/20 0600  cefoTEtan (CEFOTAN) 2 g in sodium chloride 0.9 % 100 mL IVPB     2 g 200 mL/hr over 30 Minutes Intravenous On call to O.R. 01/11/20 1022 01/12/20 1213       Objective: Vitals:   01/22/20 0649 01/22/20 1352 01/22/20 2106 01/23/20 0526  BP: (!) 116/48 (!) 136/57 Marland Kitchen)  135/48 (!) 117/42  Pulse: 85 88 92 78  Resp: 14 16 16 14   Temp: 98.4 F (36.9 C) 98.3 F (36.8 C) 98.6 F (37 C) 98.6 F (37 C)  TempSrc: Oral Oral    SpO2: 100% 100% 100% 100%  Weight:      Height:        Intake/Output Summary (Last 24 hours) at 01/23/2020 1058 Last data filed at 01/23/2020 0900 Gross per 24 hour  Intake 360 ml  Output 400 ml  Net -40 ml   Filed Weights   01/11/20 0317 01/13/20 1132 01/20/20 1729  Weight: 72.6 kg 55.7 kg 57.8 kg    Examination: General exam: Appears calm and comfortable  Respiratory system: Clear to auscultation. Respiratory  effort normal. Cardiovascular system: S1 & S2 heard, RRR. No pedal edema. Gastrointestinal system: Abdomen is nondistended, soft Central nervous system: Alert and oriented. Speech clear  Extremities: Symmetric in appearance bilaterally    Data Reviewed: I have personally reviewed following labs and imaging studies  CBC: Recent Labs  Lab 01/19/20 0300 01/19/20 0300 01/20/20 0453 01/20/20 2047 01/21/20 0510 01/22/20 0447 01/23/20 0433  WBC 6.4  --  9.1  --  13.4* 13.2* 12.1*  HGB 7.4*   < > 6.7* 9.4* 9.3* 9.0* 8.8*  HCT 22.4*   < > 20.2* 28.0* 27.6* 26.8* 27.1*  MCV 104.2*  --  105.2*  --  100.7* 101.1* 101.9*  PLT 112*  --  161  --  234 334 447*   < > = values in this interval not displayed.   Basic Metabolic Panel: Recent Labs  Lab 01/17/20 0309 01/17/20 0309 01/18/20 0351 01/19/20 0300 01/20/20 0453 01/21/20 0510 01/22/20 0447  NA 139   < > 138 137 134* 139 137  K 3.7   < > 4.4 4.5 4.1 3.9 3.6  CL 103   < > 107 105 107 110 109  CO2 30   < > 27 26 22  21* 20*  GLUCOSE 93   < > 107* 77 79 67* 84  BUN 18   < > 19 18 15 11 10   CREATININE 0.44   < > 0.46 0.49 0.59 0.59 0.62  CALCIUM 8.2*   < > 8.4* 8.3* 8.1* 8.4* 8.2*  MG 2.0  --  1.7 1.9 1.8  --  2.1  PHOS 2.8  --  2.5  --   --   --   --    < > = values in this interval not displayed.   GFR: Estimated Creatinine Clearance: 54.6 mL/min (by C-G formula based on SCr of 0.62 mg/dL). Liver Function Tests: No results for input(s): AST, ALT, ALKPHOS, BILITOT, PROT, ALBUMIN in the last 168 hours. No results for input(s): LIPASE, AMYLASE in the last 168 hours. No results for input(s): AMMONIA in the last 168 hours. Coagulation Profile: No results for input(s): INR, PROTIME in the last 168 hours. Cardiac Enzymes: No results for input(s): CKTOTAL, CKMB, CKMBINDEX, TROPONINI in the last 168 hours. BNP (last 3 results) No results for input(s): PROBNP in the last 8760 hours. HbA1C: No results for input(s): HGBA1C in the  last 72 hours. CBG: Recent Labs  Lab 01/17/20 1744 01/17/20 2355 01/18/20 0621 01/18/20 1147 01/18/20 1801  GLUCAP 121* 147* 116* 119* 96   Lipid Profile: No results for input(s): CHOL, HDL, LDLCALC, TRIG, CHOLHDL, LDLDIRECT in the last 72 hours. Thyroid Function Tests: No results for input(s): TSH, T4TOTAL, FREET4, T3FREE, THYROIDAB in the last 72 hours. Anemia Panel:  No results for input(s): VITAMINB12, FOLATE, FERRITIN, TIBC, IRON, RETICCTPCT in the last 72 hours. Sepsis Labs: Recent Labs  Lab 01/18/20 0351  PROCALCITON 4.88    Recent Results (from the past 240 hour(s))  Culture, Urine     Status: None   Collection Time: 01/14/20 11:50 AM   Specimen: Urine, Random  Result Value Ref Range Status   Specimen Description   Final    URINE, RANDOM Performed at [facility], [address] [place], [place] [address]    Special Requests   Final    NONE Performed at [facility], [address], [place], [place] [address]    Culture   Final    NO GROWTH Performed at [facility], [address] [place], [place] [address]    Report Status 01/16/2020 FINAL  Final  Culture, blood (routine x 2)     Status: Abnormal   Collection Time: 01/14/20 12:02 PM   Specimen: BLOOD  Result Value Ref Range Status   Specimen Description   Final    BLOOD Performed at [facility], [address], [place], [place] [address]    Special Requests   Final    BOTTLES DRAWN AEROBIC AND ANAEROBIC Blood Culture adequate volume Performed at [facility], [address], [place], [place] [address]    Culture  Setup Time   Final    ANAEROBIC BOTTLE ONLY GRAM POSITIVE RODS CRITICAL RESULT CALLED TO, READ BACK BY AND VERIFIED WITHAlgis Downs [facility] PHARMD 01/17/20 JDW Performed at [facility], [address], [place], [place] [address]    Culture CLOSTRIDIUM PARAPUTRIFICUM (A)  Final   Report Status 01/20/2020  FINAL  Final  Culture, blood (routine x 2)     Status: None   Collection Time: 01/14/20 12:10 PM   Specimen: BLOOD  Result Value Ref Range Status   Specimen Description   Final    BLOOD LEFT WRIST Performed at [facility], [address], [place], [place] [address]    Special Requests   Final    BOTTLES DRAWN AEROBIC AND ANAEROBIC Blood Culture adequate volume Performed at [facility], [address] [place], [place] [address]    Culture  Setup Time   Final    GRAM POSITIVE RODS ANAEROBIC BOTTLE ONLY CRITICAL RESULT CALLED TO, READ BACK BY AND VERIFIED WITH: T. GREEN, PHARMD ([facility]) AT 1604 ON 01/22/20 BY C. JESSUP, MT.    Culture   Final    EGGERTHIA CATENAFORMIS Performed at [facility], [address] [place], [place] [address]    Report Status 01/22/2020 FINAL  Final  Culture, blood (routine x 2)     Status: None (Preliminary result)   Collection Time: 01/19/20  2:11 PM   Specimen: BLOOD LEFT FOREARM  Result Value Ref Range Status   Specimen Description BLOOD LEFT FOREARM  Final   Special Requests   Final    BOTTLES DRAWN AEROBIC ONLY Blood Culture adequate volume Performed at [facility], [address] [place], [place] [address]    Culture NO GROWTH 4 DAYS  Final   Report Status PENDING  Incomplete  Culture, blood (routine x 2)     Status: None (Preliminary result)   Collection Time: 01/19/20  2:11 PM   Specimen: BLOOD LEFT FOREARM  Result Value Ref Range Status   Specimen Description BLOOD LEFT FOREARM  Final   Special Requests  Final    BOTTLES DRAWN AEROBIC ONLY Blood Culture adequate volume Performed at [facility], [address] [place], [place] [address]    Culture NO GROWTH 4 DAYS  Final   Report Status PENDING  Incomplete      Radiology Studies: No results found.    Scheduled Meds: . Chlorhexidine Gluconate Cloth  6 each Topical Daily  . docusate sodium  100 mg  Oral BID  . feeding supplement  1 Container Oral TID BM  . feeding supplement (PRO-STAT SUGAR FREE 64)  30 mL Oral BID  . ferrous sulfate  325 mg Oral Q breakfast  . folic acid  1 mg Oral Daily  . gabapentin  200 mg Oral TID  . methocarbamol  1,000 mg Oral TID  . metroNIDAZOLE  500 mg Oral Q8H  . multivitamin with minerals  1 tablet Oral Daily  . pantoprazole  40 mg Oral Daily  . polyethylene glycol  17 g Oral Daily  . sodium chloride flush  10-40 mL Intracatheter Q12H   Continuous Infusions:    LOS: 12 days      Time spent: 20 minutes   Noralee Stain, DO [facility] 01/23/2020, 10:58 AM   Available via Epic secure chat 7am-7pm After these hours, please refer to coverage provider listed on [email or web address]

## 2020-01-23 NOTE — Progress Notes (Signed)
11 Days Post-Op    CC: Abdominal pain  Subjective: Patient is lying in chair.  No acute distress she continues to complain of abdominal pain.  Her midline incision looks fine.  She is afebrile and vital signs are stable.  Objective: Vital signs in last 24 hours: Temp:  [98.3 F (36.8 C)-98.6 F (37 C)] 98.6 F (37 C) (03/29 0526) Pulse Rate:  [78-92] 78 (03/29 0526) Resp:  [14-16] 14 (03/29 0526) BP: (117-136)/(42-57) 117/42 (03/29 0526) SpO2:  [100 %] 100 % (03/29 0526) Last BM Date: 01/21/20(per pt but she said she can't remember for sure) 360 p.o. 400 urine Stool x1 Afebrile vital signs are stable H/H 8.8/27.1 WBC 12.1 Intake/Output from previous day: 03/28 0701 - 03/29 0700 In: 360 [P.O.:360] Out: 400 [Urine:400] Intake/Output this shift: Total I/O In: 240 [P.O.:240] Out: -   General appearance: alert, cooperative and no distress Resp: clear to auscultation bilaterally GI: Soft continues to complain of pain.  Midline incision is healing well.  Tolerating regular diet and positive bowel movements. Extremities: extremities normal, atraumatic, no cyanosis or edema  Lab Results:  Recent Labs    01/22/20 0447 01/23/20 0433  WBC 13.2* 12.1*  HGB 9.0* 8.8*  HCT 26.8* 27.1*  PLT 334 447*    BMET Recent Labs    01/21/20 0510 01/22/20 0447  NA 139 137  K 3.9 3.6  CL 110 109  CO2 21* 20*  GLUCOSE 67* 84  BUN 11 10  CREATININE 0.59 0.62  CALCIUM 8.4* 8.2*   PT/INR No results for input(s): LABPROT, INR in the last 72 hours.  No results for input(s): AST, ALT, ALKPHOS, BILITOT, PROT, ALBUMIN in the last 168 hours.   Lipase     Component Value Date/Time   LIPASE 21 01/11/2020 0728     Medications: . Chlorhexidine Gluconate Cloth  6 each Topical Daily  . docusate sodium  100 mg Oral BID  . feeding supplement  1 Container Oral TID BM  . feeding supplement (PRO-STAT SUGAR FREE 64)  30 mL Oral BID  . ferrous sulfate  325 mg Oral Q breakfast  . folic  acid  1 mg Oral Daily  . gabapentin  200 mg Oral TID  . methocarbamol  1,000 mg Oral TID  . metroNIDAZOLE  500 mg Oral Q8H  . multivitamin with minerals  1 tablet Oral Daily  . pantoprazole  40 mg Oral Daily  . polyethylene glycol  17 g Oral Daily  . sodium chloride flush  10-40 mL Intracatheter Q12H    Assessment/Plan Chronic abdominal pain- hydrocodoneuse since 01/25/18 Hypertension LVH Mitral Murmur Aortic sclerosis/bruit Hyperlipidemia Severe protein calorie malnutrition - prealbumin 15.5 3/25 from 8.1 3/22, continue nutritional supplements Acute on chronic anemia -transfused 01/18/20 and 01/20/2020 -total 3 units Clostridium bacteremia -Flagyl 3/25-01/26/20  Recurrent SBO S/P ex-lap with small bowel resection and LOA 01/12/20 Dr. Luisa Hart - POD#11 -continue diet -patient continues to be hyper-focused on pain meds, will add prn motrin ans scheduled gabapentin today to try to maximize non-narcotic pain meds - continue to encourage mobilization- PT/OT - BID dressing changes  Chronic abdominal pain  - Pain control 3/28: Neurontin 200 mg 3 times daily; hydrocodone 7.5/325 x 10       tablets; ibuprofen 400 mg x 2; Robaxin 1 g x 3 Patient's baseline is hydrocodone 7.5/325 1 tablet 4 times daily; patient has been on hydrocodone/Tylenol since 01/25/2018 -pain management [facility]. Orvan Falconer.  I called and discussed discharge  plan with Ms. Megan Salon. Plan oxycodone 10 mg every 4 hours as needed we will give her 30 tablets.  She is to follow-up with [facility] for further pain management.    FEN:reg diet XB:JYNWG/NFAOZH 3/20>3/23; PO ceftin 3/25-3/26; Flagyl 3/25 >> day 5/7 YQM:VHQI  Follow-up:Dr. Cornett   Plan: She is ready for discharge from a surgical standpoint.  Dr. Maylene Roes the of Medicine service agrees with discharge. She will go home on 3 more days of antibiotics.  I called her pain management office; [facility].  We discussed her current medical issues and pain issues.  It is their recommendation she go home on hydrocodone/acetaminophen 10/325; 1 every 6 hours.  Neurontin 200 mg 3 times daily and ibuprofen 400 mg every 6 hours as needed.  I have contacted the drugstore where her previous prescription was sent last week.  The old prescription has been canceled and a new prescription for hydrocodone/acetaminophen 10/325 has been sent to the pharmacy. I discussed with both the patient and his daughter our plan.  They both have been informed that we will not refill any narcotic prescriptions.  She is contact [facility] for all new pain management prescriptions.  She has a follow-up appointment with Dr. Brantley Stage 02/10/2020 at 11:40 AM.         LOS: 12 days    Samantha Gonzales 01/23/2020 Please see Amion

## 2020-01-23 NOTE — Discharge Summary (Signed)
Physician Discharge Summary  Patient ID: Samantha Gonzales MRN: [id number] DOB/AGE: 02-Oct-1941 79 y.o.  Admit date: 01/11/2020 Discharge date: 01/23/2020  Recurrent SBO Chronic abdominal pain HTN LVH Mitral valve murmur Aortic sclerosis HLD Severe protein calorie malnutrition   Discharge Diagnosis Recurrent SBO Chronic abdominal pain HTN LVH Mitral valve murmur Aortic sclerosis HLD Severe protein calorie malnutrition               Acute on chronic anemia -transfused 01/18/20 and 01/20/2020 -total 3               units               Clostridium bacteremia -Flagyl 3/25-01/26/20   Consultants Internal medicine   Imaging: Imaging Results (Last 48 hours)  No results found.    Procedures Dr. Brantley Stage (01/12/20) - Exploratory laparotomy with small bowel resection and lysis of adhesions  Hospital Course:  Patient is a 79 year old female who presented to [facility] with recurrent sbo.  Patient was admitted and underwent procedure listed above. Tolerated procedure well and was transferred to the floor. Patient developed mild ileus postoperatively but this improved and diet was advanced as tolerated. Patient was weaned off TPN with advancement in diet. Patient developed some diarrhea 3/23 but this was determined to be related to Ensure and resolved when ensure was discontinued. Of note, post-operative course complicated by issues with pain control. Patient has been on chronic pain medication for chronic abdominal pain since 2019. Patient also refused to take PO pain medications at times and would ask specifically for IV pain medications. PT/OT evaluated patient and recommended home therapies. On POD7, the patient was voiding well, tolerating diet, ambulating well, pain reasonably well controlled, vital signs stable, wound without infection and felt stable for discharge home. Blood cultures on 01/14/20, were found to be positive for Gm positive rods, the first was thought to be a contaminate, but  when both were positive the DC was held and Medicine discussed with ID.  They recommended antibiotics and await the final blood culture results. Final results in Culture 1:EGGERTHIA CATENAFORMIS Culture 2: CLOSTRIDIUM PARAPUTRIFICUMAbnormal   Repeat cultures were also redrawn and 01/19/20.  These were all negative.  At [facility] recommendation Pt was left on 7 days of Flagyl.    Because of her anemia she was transfused and place on Fe supplement, MVI, and folic acid.  We tried all types of medical treatment combinations for for her abdominal pain.  Even though she did not appear in distress she continued to have abdominal pain that is generalized. We discussed with [facility], who has been managing her pain medicine since 2019.  They made the recommendation noted below.  Hydrocodone/acetaminophen 10/325 Q6h PRN Neurontin 200 mg TID Ibuprofen 400 mg q6h PRN They will follow up with further pain management from their office.  I told the patient and Daughter that we would not refill any prescriptions. I called her pharmacy at [facility].  I told them of the changes and discontinued her old Rx for hydrocodone/acetaminophen and accepted the new Rx.  We ask Case managers to discuss home care options with family at home.  PT and home health ordered.    CBC Latest Ref Rng & Units 01/23/2020 01/22/2020 01/21/2020  WBC 4.0 - 10.5 K/uL 12.1(H) 13.2(H) 13.4(H)  Hemoglobin 12.0 - 15.0 g/dL 8.8(L) 9.0(L) 9.3(L)  Hematocrit 36.0 - 46.0 % 27.1(L) 26.8(L) 27.6(L)  Platelets 150 - 400 K/uL 447(H) 334 234  CMP Latest Ref Rng & Units 01/22/2020 01/21/2020 01/20/2020  Glucose 70 - 99 mg/dL 84 73(U) 79  BUN 8 - 23 mg/dL 10 11 15   Creatinine 0.44 - 1.00 mg/dL 2.02 5.42  Sodium 135 - 145 mmol/L 137 139 134(L)  Potassium 3.5 - 5.1 mmol/L 3.6 3.9 4.1  Chloride 98 - 111 mmol/L 109 110 107  CO2 22 - 32 mmol/L 20(L) 21(L) 22  Calcium 8.9 - 10.3 mg/dL 8.2(L) 8.4(L) 8.1(L)  Total Protein 6.5 - 8.1  g/dL - - -  Total Bilirubin 0.3 - 1.2 mg/dL - - -  Alkaline Phos 38 - 126 U/L - - -  AST 15 - 41 U/L - - -  ALT 0 - 44 U/L - - -    Patient will follow up in our office in 2-3 weeks and knows to call with questions or concerns.     Physical Exam: General: pleasant,cachectic,NAD Heart: regular, rate, and rhythm. Normal s1,s2. No obvious murmurs, gallops, or rubs noted. Palpable radial and pedal pulses bilaterally Lungs: CTAB, no wheezes, rhonchi, or rales noted. Respiratory effort nonlabored Abd: soft,appropriately ttp, ND,BS hypoactive, midline incision is OK, Pt still complaining of pain.   MS: all 4 extremities are symmetrical with no cyanosis, clubbing, or edema. Skin: warm and dry with no masses, lesions, or rashes  I have personally looked this patient up in the [place] Controlled Substance Database and reviewed their medications.   Condition on DC:  Improved    Disposition: Discharge disposition: 01-Home or Self Care        Allergies as of 01/23/2020      Reactions   Penicillins Hives, Itching, Rash   Has patient had a PCN reaction causing immediate rash, facial/tongue/throat swelling, SOB or lightheadedness with hypotension: Yes Has patient had a PCN reaction causing severe rash involving mucus membranes or skin necrosis: yes Has patient had a PCN reaction that required hospitalization: unk Has patient had a PCN reaction occurring within the last 10 years: unk If all of the above answers are "NO", then may proceed with Cephalosporin use.      Medication List    STOP taking these medications   HYDROcodone-acetaminophen 7.5-325 MG tablet Commonly known as: NORCO Replaced by: HYDROcodone-acetaminophen 10-325 MG tablet   polyethylene glycol 17 g packet Commonly known as: MIRALAX / GLYCOLAX   senna 8.6 MG Tabs tablet Commonly known as: SENOKOT     TAKE these medications   amLODipine 10 MG tablet Commonly known as: NORVASC Take 10 mg by mouth daily.    docusate sodium 100 MG capsule Commonly known as: Colace Take 1 capsule (100 mg total) by mouth 2 (two) times daily as needed for mild constipation.   ferrous sulfate 325 (65 FE) MG tablet Take 1 tablet (325 mg total) by mouth daily with breakfast.   folic acid 1 MG tablet Commonly known as: FOLVITE Take 1 tablet (1 mg total) by mouth daily.   gabapentin 100 MG capsule Commonly known as: NEURONTIN Take 2 capsules (200 mg total) by mouth 3 (three) times daily.   HYDROcodone-acetaminophen 10-325 MG tablet Commonly known as: Norco Take 1 tablet by mouth every 6 (six) hours as needed for severe pain. Replaces: HYDROcodone-acetaminophen 7.5-325 MG tablet   ibuprofen 200 MG tablet Commonly known as: ADVIL You can take 2 tablets every 6 hours as needed for pain.  Follow package directions.  You can buy this over-the-counter at any drugstore.   metroNIDAZOLE 500 MG tablet Commonly known as: FLAGYL  Take 1 tablet (500 mg total) by mouth every 8 (eight) hours.   multivitamin with minerals Tabs tablet Take 1 tablet by mouth daily.   rosuvastatin 20 MG tablet Commonly known as: CRESTOR Take 20 mg by mouth daily.      Follow-up Information    Harriette Bouillon, MD. Go on 02/10/2020.   Specialty: General Surgery Why: Follow up appointment scheduled for 11:40 AM. Please arrive 30 min prior for check in. Bring photo ID and insurance information.  Contact information: [address] [place] [address] [phone]        Roche, Nolon Bussing, PA-C. Call.   Specialty: Physician Assistant Why: Call and follow up with pain management. Let them know you had surgery and were given an additional prescription for pain medication. They can resume caring for chronic pain.  Contact information: [address] [place] [address] [phone]           Signed: Sherrie George 01/23/2020, 2:51 PM

## 2020-01-23 NOTE — TOC Transition Note (Signed)
Transition of Care [facility]) - CM/SW Discharge Note   Patient Details  Name: Samantha Gonzales MRN: [id number] Date of Birth: Jan 10, 1941  Transition of Care [facility] CM/SW Contact:  Amada Jupiter, LCSW Phone Number: 01/23/2020, 12:28 PM   Clinical Narrative: Spoke with patient's daughter, [name], who is aware pt cleared for d/c today.  She reports that she will be the person to assist pt with dressing changes at home and is prepared to received education from nursing prior to d/c. No further needs.      Final next level of care: Home w Home Health Services Barriers to Discharge: Barriers Resolved   Patient Goals and CMS Choice Patient states their goals for this hospitalization and ongoing recovery are:: Return Home CMS Medicare.gov Compare Post Acute Care list provided to:: Patient Choice offered to / list presented to : Patient, Adult Children  Discharge Placement                       Discharge Plan and Services   Discharge Planning Services: CM Consult Post Acute Care Choice: Home Health          DME Arranged: N/A         HH Arranged: RN, PT HH Agency: [facility] Date [facility] Agency Contacted: 01/23/20 Time HH Agency Contacted: 1227 Representative spoke with at [facility] Agency: Kandee Keen  Social Determinants of Health (SDOH) Interventions     Readmission Risk Interventions No flowsheet data found.

## 2020-01-23 NOTE — Progress Notes (Signed)
Physical Therapy Treatment Patient Details Name: JOZELYNN DANIELSON MRN: [id number] DOB: September 22, 1941 Today's Date: 01/23/2020    History of Present Illness Admitted with Small Bowel Obstruction and underwent Exploratory Lap on 01/12/2020- Had come in with abdominal pain and vomiting. Has had agitation including pulling IV out- PMH includes essential HTN, possible hypertrophic cardiomyopathy, HLD, AKI, hypomagnesemia, hypophosphateemia, cognitive impairment, and tachycardia.,    PT Comments    Pt OOB in recliner with OT.  Assisted with amb.  General transfer comment: 75% VC's on proper hand placement and safety with turns. General Gait Details: Required MAX encouragement but did amb a functional distance.    Follow Up Recommendations  Supervision/Assistance - 24 hour;SNF;Supervision for mobility/OOB(per chart review, pt is going home)     Equipment Recommendations  None recommended by PT    Recommendations for Other Services       Precautions / Restrictions Precautions Precautions: Fall Precaution Comments: recent ABD surgery Restrictions Weight Bearing Restrictions: No    Mobility  Bed Mobility Overal bed mobility: Needs Assistance Bed Mobility: Rolling;Sidelying to Sit Rolling: Min assist Sidelying to sit: Min assist;HOB elevated       General bed mobility comments: OOB in recliner  Transfers Overall transfer level: Needs assistance Equipment used: Rolling walker (2 wheeled);None Transfers: Sit to/from Stand Sit to Stand: Supervision;Min guard Stand pivot transfers: Supervision;Min guard       General transfer comment: 75% VC's on proper hand placement and safety with turns.  Ambulation/Gait Ambulation/Gait assistance: Supervision;Min guard Gait Distance (Feet): 115 Feet Assistive device: Rolling walker (2 wheeled) Gait Pattern/deviations: Decreased stride length;Step-through pattern;Trunk flexed Gait velocity: decreased   General Gait Details: Required MAX  encouragement but did amb a functional distance   Stairs             Wheelchair Mobility    Modified Rankin (Stroke Patients Only)       Balance Overall balance assessment: Needs assistance Sitting-balance support: Feet supported;No upper extremity supported Sitting balance-Leahy Scale: Fair     Standing balance support: Bilateral upper extremity supported;During functional activity Standing balance-Leahy Scale: Poor Standing balance comment: requires external support during dynamic standing                            Cognition Arousal/Alertness: Awake/alert Behavior During Therapy: Flat affect Overall Cognitive Status: No family/caregiver present to determine baseline cognitive functioning Area of Impairment: Attention;Following commands;Safety/judgement;Awareness;Problem solving                   Current Attention Level: Sustained   Following Commands: Follows one step commands with increased time Safety/Judgement: Decreased awareness of deficits;Decreased awareness of safety Awareness: Intellectual Problem Solving: Requires verbal cues;Requires tactile cues;Difficulty sequencing General Comments: required MAX encouragement      Exercises      General Comments        Pertinent Vitals/Pain Pain Assessment: Faces Faces Pain Scale: Hurts little more Pain Location: abdomen Pain Descriptors / Indicators: Sore Pain Intervention(s): Monitored during session    Home Living                      Prior Function            PT Goals (current goals can now be found in the care plan section) Acute Rehab PT Goals Patient Stated Goal: decrease pain Progress towards PT goals: Progressing toward goals    Frequency    Min 2X/week  PT Plan Discharge plan needs to be updated    Co-evaluation              AM-PAC PT "6 Clicks" Mobility   Outcome Measure  Help needed turning from your back to your side while in a flat  bed without using bedrails?: A Little Help needed moving from lying on your back to sitting on the side of a flat bed without using bedrails?: A Little Help needed moving to and from a bed to a chair (including a wheelchair)?: A Little Help needed standing up from a chair using your arms (e.g., wheelchair or bedside chair)?: A Little Help needed to walk in hospital room?: A Little Help needed climbing 3-5 steps with a railing? : A Little 6 Click Score: 18    End of Session   Activity Tolerance: Other (comment)(pt self limiting) Patient left: in chair;with call bell/phone within reach;with chair alarm set Nurse Communication: Mobility status PT Visit Diagnosis: Other abnormalities of gait and mobility (R26.89);Unsteadiness on feet (R26.81);Pain     Time: 1135-1200 PT Time Calculation (min) (ACUTE ONLY): 25 min  Charges:  $Gait Training: 8-22 mins $Therapeutic Activity: 8-22 mins                     Rica Koyanagi  PTA Acute  Rehabilitation Services Pager      [phone] Office      [phone]

## 2020-01-23 NOTE — Plan of Care (Signed)
Patient was discharged home with home health today. Wound teaching was given to daughter and patient and all questions were answered. Patients belongings were returned to her and she was taken to main entrance via wheelchair.

## 2020-01-23 NOTE — Plan of Care (Signed)

## 2020-01-24 LAB — CULTURE, BLOOD (ROUTINE X 2)
Culture: NO GROWTH
Culture: NO GROWTH
Special Requests: ADEQUATE
Special Requests: ADEQUATE

## 2020-01-29 ENCOUNTER — Inpatient Hospital Stay (HOSPITAL_COMMUNITY)
Admission: EM | Admit: 2020-01-29 | Discharge: 2020-02-03 | DRG: 862 | Disposition: A | Discharge status: Home Health | Payer: Medicare PPO | Attending: Physician Assistant | Admitting: Physician Assistant

## 2020-01-29 ENCOUNTER — Other Ambulatory Visit: Payer: Self-pay

## 2020-01-29 DIAGNOSIS — G8929 Other chronic pain: Secondary | ICD-10-CM | POA: Diagnosis present

## 2020-01-29 DIAGNOSIS — E43 Unspecified severe protein-calorie malnutrition: Secondary | ICD-10-CM | POA: Diagnosis present

## 2020-01-29 DIAGNOSIS — E785 Hyperlipidemia, unspecified: Secondary | ICD-10-CM | POA: Diagnosis present

## 2020-01-29 DIAGNOSIS — K56609 Unspecified intestinal obstruction, unspecified as to partial versus complete obstruction: Secondary | ICD-10-CM

## 2020-01-29 DIAGNOSIS — Z79899 Other long term (current) drug therapy: Secondary | ICD-10-CM

## 2020-01-29 DIAGNOSIS — S301XXA Contusion of abdominal wall, initial encounter: Secondary | ICD-10-CM | POA: Diagnosis present

## 2020-01-29 DIAGNOSIS — K651 Peritoneal abscess: Secondary | ICD-10-CM

## 2020-01-29 DIAGNOSIS — I10 Essential (primary) hypertension: Secondary | ICD-10-CM | POA: Diagnosis present

## 2020-01-29 DIAGNOSIS — Z880 Allergy status to penicillin: Secondary | ICD-10-CM

## 2020-01-29 DIAGNOSIS — E876 Hypokalemia: Secondary | ICD-10-CM | POA: Diagnosis present

## 2020-01-29 DIAGNOSIS — Z9049 Acquired absence of other specified parts of digestive tract: Secondary | ICD-10-CM

## 2020-01-29 DIAGNOSIS — I700 Atherosclerosis of aorta: Secondary | ICD-10-CM | POA: Diagnosis present

## 2020-01-29 DIAGNOSIS — I340 Nonrheumatic mitral (valve) insufficiency: Secondary | ICD-10-CM | POA: Diagnosis present

## 2020-01-29 DIAGNOSIS — B9620 Unspecified Escherichia coli [E. coli] as the cause of diseases classified elsewhere: Secondary | ICD-10-CM | POA: Diagnosis present

## 2020-01-29 DIAGNOSIS — L89150 Pressure ulcer of sacral region, unstageable: Secondary | ICD-10-CM | POA: Diagnosis present

## 2020-01-29 DIAGNOSIS — L988 Other specified disorders of the skin and subcutaneous tissue: Secondary | ICD-10-CM | POA: Diagnosis present

## 2020-01-29 DIAGNOSIS — Z720 Tobacco use: Secondary | ICD-10-CM

## 2020-01-29 DIAGNOSIS — R109 Unspecified abdominal pain: Secondary | ICD-10-CM | POA: Diagnosis present

## 2020-01-29 DIAGNOSIS — D649 Anemia, unspecified: Secondary | ICD-10-CM | POA: Diagnosis present

## 2020-01-29 DIAGNOSIS — B952 Enterococcus as the cause of diseases classified elsewhere: Secondary | ICD-10-CM | POA: Diagnosis present

## 2020-01-29 DIAGNOSIS — T8143XA Infection following a procedure, organ and space surgical site, initial encounter: Secondary | ICD-10-CM | POA: Diagnosis not present

## 2020-01-29 DIAGNOSIS — N739 Female pelvic inflammatory disease, unspecified: Secondary | ICD-10-CM | POA: Diagnosis present

## 2020-01-29 DIAGNOSIS — Z20822 Contact with and (suspected) exposure to covid-19: Secondary | ICD-10-CM | POA: Diagnosis present

## 2020-01-29 DIAGNOSIS — R32 Unspecified urinary incontinence: Secondary | ICD-10-CM | POA: Diagnosis present

## 2020-01-29 DIAGNOSIS — R7881 Bacteremia: Secondary | ICD-10-CM | POA: Diagnosis present

## 2020-01-29 LAB — CBC WITH DIFFERENTIAL/PLATELET
Abs Immature Granulocytes: 0.08 10*3/uL — ABNORMAL HIGH (ref 0.00–0.07)
Basophils Absolute: 0 10*3/uL (ref 0.0–0.1)
Basophils Relative: 0 %
Eosinophils Absolute: 0 10*3/uL (ref 0.0–0.5)
Eosinophils Relative: 0 %
HCT: 34.1 % — ABNORMAL LOW (ref 36.0–46.0)
Hemoglobin: 11.3 g/dL — ABNORMAL LOW (ref 12.0–15.0)
Immature Granulocytes: 1 %
Lymphocytes Relative: 5 %
Lymphs Abs: 0.6 10*3/uL — ABNORMAL LOW (ref 0.7–4.0)
MCH: 32.8 pg (ref 26.0–34.0)
MCHC: 33.1 g/dL (ref 30.0–36.0)
MCV: 99.1 fL (ref 80.0–100.0)
Monocytes Absolute: 0.6 10*3/uL (ref 0.1–1.0)
Monocytes Relative: 5 %
Neutro Abs: 11.2 10*3/uL — ABNORMAL HIGH (ref 1.7–7.7)
Neutrophils Relative %: 89 %
Platelets: 518 10*3/uL — ABNORMAL HIGH (ref 150–400)
RBC: 3.44 MIL/uL — ABNORMAL LOW (ref 3.87–5.11)
RDW: 15 % (ref 11.5–15.5)
WBC: 12.6 10*3/uL — ABNORMAL HIGH (ref 4.0–10.5)
nRBC: 0 % (ref 0.0–0.2)

## 2020-01-29 MED ORDER — FENTANYL CITRATE (PF) 100 MCG/2ML IJ SOLN
100.0000 ug | Freq: Once | INTRAMUSCULAR | Status: AC
  Administered 2020-01-29: 100 ug via INTRAVENOUS
  Filled 2020-01-29: qty 2

## 2020-01-29 MED ORDER — ONDANSETRON HCL 4 MG/2ML IJ SOLN
4.0000 mg | Freq: Once | INTRAMUSCULAR | Status: AC
  Administered 2020-01-29: 4 mg via INTRAVENOUS
  Filled 2020-01-29: qty 2

## 2020-01-29 MED ORDER — SODIUM CHLORIDE 0.9 % IV BOLUS
1000.0000 mL | Freq: Once | INTRAVENOUS | Status: AC
  Administered 2020-01-29: 1000 mL via INTRAVENOUS

## 2020-01-29 NOTE — ED Triage Notes (Signed)
As per EMS pt c/o abd pain x 4 days.

## 2020-01-30 ENCOUNTER — Emergency Department (HOSPITAL_COMMUNITY): Payer: Medicare PPO

## 2020-01-30 ENCOUNTER — Observation Stay (HOSPITAL_COMMUNITY): Payer: Medicare PPO

## 2020-01-30 ENCOUNTER — Encounter (HOSPITAL_COMMUNITY): Payer: Self-pay

## 2020-01-30 DIAGNOSIS — Z720 Tobacco use: Secondary | ICD-10-CM | POA: Diagnosis not present

## 2020-01-30 DIAGNOSIS — R109 Unspecified abdominal pain: Secondary | ICD-10-CM | POA: Diagnosis present

## 2020-01-30 DIAGNOSIS — I340 Nonrheumatic mitral (valve) insufficiency: Secondary | ICD-10-CM | POA: Diagnosis present

## 2020-01-30 DIAGNOSIS — R7881 Bacteremia: Secondary | ICD-10-CM | POA: Diagnosis present

## 2020-01-30 DIAGNOSIS — E785 Hyperlipidemia, unspecified: Secondary | ICD-10-CM | POA: Diagnosis present

## 2020-01-30 DIAGNOSIS — N739 Female pelvic inflammatory disease, unspecified: Secondary | ICD-10-CM | POA: Diagnosis present

## 2020-01-30 DIAGNOSIS — G8929 Other chronic pain: Secondary | ICD-10-CM | POA: Diagnosis present

## 2020-01-30 DIAGNOSIS — E876 Hypokalemia: Secondary | ICD-10-CM | POA: Diagnosis present

## 2020-01-30 DIAGNOSIS — Z9049 Acquired absence of other specified parts of digestive tract: Secondary | ICD-10-CM

## 2020-01-30 DIAGNOSIS — B9620 Unspecified Escherichia coli [E. coli] as the cause of diseases classified elsewhere: Secondary | ICD-10-CM | POA: Diagnosis present

## 2020-01-30 DIAGNOSIS — L89150 Pressure ulcer of sacral region, unstageable: Secondary | ICD-10-CM | POA: Diagnosis present

## 2020-01-30 DIAGNOSIS — T8143XA Infection following a procedure, organ and space surgical site, initial encounter: Secondary | ICD-10-CM | POA: Diagnosis present

## 2020-01-30 DIAGNOSIS — E43 Unspecified severe protein-calorie malnutrition: Secondary | ICD-10-CM | POA: Diagnosis present

## 2020-01-30 DIAGNOSIS — I700 Atherosclerosis of aorta: Secondary | ICD-10-CM | POA: Diagnosis present

## 2020-01-30 DIAGNOSIS — Z20822 Contact with and (suspected) exposure to covid-19: Secondary | ICD-10-CM | POA: Diagnosis present

## 2020-01-30 DIAGNOSIS — R32 Unspecified urinary incontinence: Secondary | ICD-10-CM | POA: Diagnosis present

## 2020-01-30 DIAGNOSIS — I10 Essential (primary) hypertension: Secondary | ICD-10-CM | POA: Diagnosis present

## 2020-01-30 DIAGNOSIS — D649 Anemia, unspecified: Secondary | ICD-10-CM | POA: Diagnosis present

## 2020-01-30 DIAGNOSIS — Z880 Allergy status to penicillin: Secondary | ICD-10-CM | POA: Diagnosis not present

## 2020-01-30 DIAGNOSIS — L988 Other specified disorders of the skin and subcutaneous tissue: Secondary | ICD-10-CM | POA: Diagnosis present

## 2020-01-30 DIAGNOSIS — S301XXA Contusion of abdominal wall, initial encounter: Secondary | ICD-10-CM | POA: Diagnosis present

## 2020-01-30 DIAGNOSIS — Z79899 Other long term (current) drug therapy: Secondary | ICD-10-CM | POA: Diagnosis not present

## 2020-01-30 DIAGNOSIS — B952 Enterococcus as the cause of diseases classified elsewhere: Secondary | ICD-10-CM | POA: Diagnosis present

## 2020-01-30 LAB — COMPREHENSIVE METABOLIC PANEL
ALT: 8 U/L (ref 0–44)
AST: 12 U/L — ABNORMAL LOW (ref 15–41)
Albumin: 2.5 g/dL — ABNORMAL LOW (ref 3.5–5.0)
Alkaline Phosphatase: 68 U/L (ref 38–126)
Anion gap: 9 (ref 5–15)
BUN: 5 mg/dL — ABNORMAL LOW (ref 8–23)
CO2: 25 mmol/L (ref 22–32)
Calcium: 8.9 mg/dL (ref 8.9–10.3)
Chloride: 107 mmol/L (ref 98–111)
Creatinine, Ser: 0.62 mg/dL (ref 0.44–1.00)
GFR calc Af Amer: >60 mL/min (ref >60)
GFR calc non Af Amer: >60 mL/min (ref >60)
Glucose, Bld: 106 mg/dL — ABNORMAL HIGH (ref 70–99)
Potassium: 2.6 mmol/L — CL (ref 3.5–5.1)
Sodium: 141 mmol/L (ref 135–145)
Total Bilirubin: 0.7 mg/dL (ref 0.3–1.2)
Total Protein: 6.1 g/dL — ABNORMAL LOW (ref 6.5–8.1)

## 2020-01-30 LAB — URINALYSIS, ROUTINE W REFLEX MICROSCOPIC
Bilirubin Urine: NEGATIVE
Glucose, UA: NEGATIVE mg/dL
Ketones, ur: 5 mg/dL — AB
Nitrite: NEGATIVE
Protein, ur: 30 mg/dL — AB
Specific Gravity, Urine: 1.03 (ref 1.005–1.030)
pH: 5 (ref 5.0–8.0)

## 2020-01-30 LAB — PROTIME-INR
INR: 1 (ref 0.8–1.2)
Prothrombin Time: 13.1 seconds (ref 11.4–15.2)

## 2020-01-30 LAB — LIPASE, BLOOD: Lipase: 35 U/L (ref 11–51)

## 2020-01-30 LAB — MAGNESIUM: Magnesium: 1.8 mg/dL (ref 1.7–2.4)

## 2020-01-30 LAB — LACTIC ACID, PLASMA
Lactic Acid, Venous: 1.1 mmol/L (ref 0.5–1.9)
Lactic Acid, Venous: 2.1 mmol/L (ref 0.5–1.9)

## 2020-01-30 LAB — SARS CORONAVIRUS 2 (TAT 6-24 HRS): SARS Coronavirus 2: NEGATIVE

## 2020-01-30 LAB — GLUCOSE, CAPILLARY: Glucose-Capillary: 78 mg/dL (ref 70–99)

## 2020-01-30 MED ORDER — ONDANSETRON 4 MG PO TBDP: 4.0000 mg | ORAL_TABLET | Freq: Four times a day (QID) | ORAL | Status: DC | PRN

## 2020-01-30 MED ORDER — MIDAZOLAM HCL 2 MG/2ML IJ SOLN
INTRAMUSCULAR | Status: AC | PRN
  Administered 2020-01-30 (×4): 0.5 mg via INTRAVENOUS

## 2020-01-30 MED ORDER — DEXTROSE-NACL 5-0.9 % IV SOLN: INTRAVENOUS | Status: DC

## 2020-01-30 MED ORDER — MORPHINE SULFATE (PF) 2 MG/ML IV SOLN
2.0000 mg | Freq: Once | INTRAVENOUS | Status: AC
  Administered 2020-01-30: 02:00:00 2 mg via INTRAVENOUS
  Filled 2020-01-30: qty 1

## 2020-01-30 MED ORDER — FENTANYL CITRATE (PF) 100 MCG/2ML IJ SOLN: 25.0000 ug | INTRAMUSCULAR | Status: DC | PRN

## 2020-01-30 MED ORDER — ENSURE ENLIVE PO LIQD
237.0000 mL | Freq: Two times a day (BID) | ORAL | Status: DC
  Administered 2020-01-30 – 2020-02-01 (×5): 237 mL via ORAL

## 2020-01-30 MED ORDER — GERHARDT'S BUTT CREAM
TOPICAL_CREAM | Freq: Two times a day (BID) | CUTANEOUS | Status: DC
  Administered 2020-01-30 – 2020-01-31 (×2): 1 via TOPICAL
  Filled 2020-01-30: qty 1

## 2020-01-30 MED ORDER — IOHEXOL 300 MG/ML  SOLN
100.0000 mL | Freq: Once | INTRAMUSCULAR | Status: AC | PRN
  Administered 2020-01-30: 01:00:00 100 mL via INTRAVENOUS

## 2020-01-30 MED ORDER — ADULT MULTIVITAMIN W/MINERALS CH
1.0000 | ORAL_TABLET | Freq: Every day | ORAL | Status: DC
  Administered 2020-01-30 – 2020-02-03 (×5): 1 via ORAL
  Filled 2020-01-30 (×5): qty 1

## 2020-01-30 MED ORDER — BOOST / RESOURCE BREEZE PO LIQD CUSTOM
1.0000 | Freq: Three times a day (TID) | ORAL | Status: DC
  Administered 2020-01-30 – 2020-02-02 (×10): 1 via ORAL

## 2020-01-30 MED ORDER — METHOCARBAMOL 500 MG PO TABS
500.0000 mg | ORAL_TABLET | Freq: Four times a day (QID) | ORAL | Status: DC | PRN
  Administered 2020-01-30 – 2020-02-02 (×5): 500 mg via ORAL
  Filled 2020-01-30 (×6): qty 1

## 2020-01-30 MED ORDER — POTASSIUM CHLORIDE 10 MEQ/100ML IV SOLN
10.0000 meq | INTRAVENOUS | Status: AC
  Administered 2020-01-30 (×2): 10 meq via INTRAVENOUS
  Filled 2020-01-30 (×2): qty 100

## 2020-01-30 MED ORDER — HYDRALAZINE HCL 20 MG/ML IJ SOLN: 10.0000 mg | INTRAMUSCULAR | Status: DC | PRN

## 2020-01-30 MED ORDER — ROSUVASTATIN CALCIUM 20 MG PO TABS
20.0000 mg | ORAL_TABLET | Freq: Every day | ORAL | Status: DC
  Administered 2020-01-30 – 2020-02-02 (×4): 20 mg via ORAL
  Filled 2020-01-30 (×6): qty 1

## 2020-01-30 MED ORDER — SODIUM CHLORIDE 0.9 % IV SOLN
INTRAVENOUS | Status: AC
  Filled 2020-01-30: qty 250

## 2020-01-30 MED ORDER — FLUMAZENIL 0.5 MG/5ML IV SOLN
INTRAVENOUS | Status: AC
  Filled 2020-01-30: qty 5

## 2020-01-30 MED ORDER — SODIUM CHLORIDE 0.9% FLUSH
5.0000 mL | Freq: Three times a day (TID) | INTRAVENOUS | Status: DC
  Administered 2020-01-30 – 2020-02-03 (×11): 5 mL

## 2020-01-30 MED ORDER — ONDANSETRON HCL 4 MG/2ML IJ SOLN: 4.0000 mg | Freq: Four times a day (QID) | INTRAMUSCULAR | Status: DC | PRN

## 2020-01-30 MED ORDER — FENTANYL CITRATE (PF) 100 MCG/2ML IJ SOLN
INTRAMUSCULAR | Status: AC
  Filled 2020-01-30: qty 2

## 2020-01-30 MED ORDER — LIDOCAINE HCL (PF) 1 % IJ SOLN
INTRAMUSCULAR | Status: AC | PRN
  Administered 2020-01-30: 10 mL via INTRADERMAL
  Administered 2020-01-30: 5 mL via INTRADERMAL

## 2020-01-30 MED ORDER — FENTANYL CITRATE (PF) 100 MCG/2ML IJ SOLN
25.0000 ug | INTRAMUSCULAR | Status: AC | PRN
  Administered 2020-01-30: 03:00:00 25 ug via INTRAVENOUS
  Filled 2020-01-30: qty 2

## 2020-01-30 MED ORDER — NALOXONE HCL 0.4 MG/ML IJ SOLN
INTRAMUSCULAR | Status: AC
  Filled 2020-01-30: qty 1

## 2020-01-30 MED ORDER — KETOROLAC TROMETHAMINE 30 MG/ML IJ SOLN
30.0000 mg | Freq: Four times a day (QID) | INTRAMUSCULAR | Status: DC | PRN
  Administered 2020-01-30 – 2020-02-02 (×9): 30 mg via INTRAVENOUS
  Filled 2020-01-30 (×9): qty 1

## 2020-01-30 MED ORDER — FENTANYL CITRATE (PF) 100 MCG/2ML IJ SOLN
INTRAMUSCULAR | Status: AC | PRN
  Administered 2020-01-30 (×4): 25 ug via INTRAVENOUS

## 2020-01-30 MED ORDER — PANTOPRAZOLE SODIUM 40 MG IV SOLR
40.0000 mg | Freq: Every day | INTRAVENOUS | Status: DC
  Administered 2020-01-30 – 2020-02-02 (×4): 40 mg via INTRAVENOUS
  Filled 2020-01-30 (×4): qty 40

## 2020-01-30 MED ORDER — OXYCODONE HCL 5 MG PO TABS
5.0000 mg | ORAL_TABLET | ORAL | Status: DC | PRN
  Administered 2020-01-30: 5 mg via ORAL
  Filled 2020-01-30: qty 1

## 2020-01-30 MED ORDER — MIDAZOLAM HCL 2 MG/2ML IJ SOLN
INTRAMUSCULAR | Status: AC
  Filled 2020-01-30: qty 2

## 2020-01-30 MED ORDER — AMLODIPINE BESYLATE 10 MG PO TABS
10.0000 mg | ORAL_TABLET | Freq: Every day | ORAL | Status: DC
  Administered 2020-01-30 – 2020-02-03 (×4): 10 mg via ORAL
  Filled 2020-01-30 (×4): qty 1

## 2020-01-30 MED ORDER — SODIUM CHLORIDE 0.45 % IV BOLUS
500.0000 mL | Freq: Once | INTRAVENOUS | Status: AC
  Administered 2020-01-30: 500 mL via INTRAVENOUS

## 2020-01-30 MED ORDER — OXYCODONE HCL 5 MG PO TABS
5.0000 mg | ORAL_TABLET | ORAL | Status: DC | PRN
  Administered 2020-01-30 – 2020-02-03 (×22): 10 mg via ORAL
  Filled 2020-01-30 (×22): qty 2

## 2020-01-30 MED ORDER — SODIUM CHLORIDE (PF) 0.9 % IJ SOLN
INTRAMUSCULAR | Status: AC
  Filled 2020-01-30: qty 50

## 2020-01-30 NOTE — Progress Notes (Signed)
Referring Physician(s): Ramirez,A  Supervising Physician: Oley Balm  Patient Status:  [facility] - In-pt  Chief Complaint: Pelvic pain/fluid collection  Subjective: Patient familiar to IR service from prior drainage of post cholecystectomy gallbladder fossa and pelvic fluid collections on 09/11/2019.  Drains were subsequently removed by surgical team.  She has a history of recurrent small bowel obstruction and is status post exploratory lap with small bowel resection and lysis of adhesions due to high-grade SBO on 01/12/2020.  She now presents with persistent abdominal/pelvic pain, recent nausea and vomiting and latest CT abdomen pelvis revealing complex fluid collection in the pelvis which abuts the surgical anastomosis concerning for abscess versus hematoma.  Also noted was enhancing 1.7 cm mass in the right lower quadrant which may represent an enlarged lymph node.  Request now received from surgery for image guided aspiration/drainage of the pelvic fluid collection.  She is currently afebrile, WBC 12.6, hemoglobin 11.3.   Past Medical History:  Diagnosis Date  . Hyperlipidemia   . Hypertension   . Syncope 10/27/2019   Past Surgical History:  Procedure Laterality Date  . CHOLECYSTECTOMY N/A 09/05/2019   Procedure: LAPAROSCOPIC CHOLECYSTECTOMY WITH INTRAOPERATIVE CHOLANGIOGRAM;  Surgeon: Harriette Bouillon, MD;  Location: [facility] OR;  Service: General;  Laterality: N/A;  . ERCP N/A 09/07/2019   Procedure: ENDOSCOPIC RETROGRADE CHOLANGIOPANCREATOGRAPHY (ERCP);  Surgeon: Vida Rigger, MD;  [facility] ENDOSCOPY;  Service: Endoscopy;  Laterality: N/A;  . LAPAROTOMY N/A 01/12/2020   Procedure: EXPLORATORY LAPAROTOMY LYSIS OF ADHESIONS AND SMALL BOWEL OBSTRUCTION;  Surgeon: Harriette Bouillon, MD;  Location: [facility] ORS;  Service: General;  Laterality: N/A;  . PANCREATIC STENT PLACEMENT  09/07/2019   Procedure: PANCREATIC STENT PLACEMENT;  Surgeon: Vida Rigger, MD;  Location: [facility] ENDOSCOPY;  Service:  Endoscopy;;  . REMOVAL OF STONES  09/07/2019   Procedure: REMOVAL OF STONES;  Surgeon: Vida Rigger, MD;  Location: [facility] ENDOSCOPY;  Service: Endoscopy;;  . Dennison Mascot  09/07/2019   Procedure: Dennison Mascot;  Surgeon: Vida Rigger, MD;  Location: [facility] ENDOSCOPY;  Service: Endoscopy;;  . TUBAL LIGATION         Allergies: Penicillins  Medications: Prior to Admission medications   Medication Sig Start Date End Date Taking? Authorizing Provider  amLODipine (NORVASC) 10 MG tablet Take 10 mg by mouth daily. 09/25/19  Yes [provider]  docusate sodium (COLACE) 100 MG capsule Take 1 capsule (100 mg total) by mouth 2 (two) times daily as needed for mild constipation. 01/19/20  Yes Rayburn, Alphonsus Sias, PA-C  HYDROcodone-acetaminophen (NORCO) 10-325 MG tablet Take 1 tablet by mouth every 6 (six) hours as needed for severe pain. 01/23/20  Yes Sherrie George, PA-C  Multiple Vitamin (MULTIVITAMIN WITH MINERALS) TABS tablet Take 1 tablet by mouth daily. 01/19/20  Yes Rayburn, Tresa Endo A, PA-C  rosuvastatin (CRESTOR) 20 MG tablet Take 20 mg by mouth daily. 07/30/18  Yes [provider]  ferrous sulfate 325 (65 FE) MG tablet Take 1 tablet (325 mg total) by mouth daily with breakfast. 01/20/20   Rayburn, Alphonsus Sias, PA-C  folic acid (FOLVITE) 1 MG tablet Take 1 tablet (1 mg total) by mouth daily. 01/20/20   Noralee Stain, DO  gabapentin (NEURONTIN) 100 MG capsule Take 2 capsules (200 mg total) by mouth 3 (three) times daily. 01/23/20   Sherrie George, PA-C  ibuprofen (ADVIL) 200 MG tablet You can take 2 tablets every 6 hours as needed for pain.  Follow package directions.  You can buy this over-the-counter at any drugstore. 01/23/20   Marlyne Beards,  Percell Miller, PA-C  metroNIDAZOLE (FLAGYL) 500 MG tablet Take 1 tablet (500 mg total) by mouth every 8 (eight) hours. 01/23/20   Earnstine Regal, PA-C     Vital Signs: BP (!) 128/56   Pulse 81   Temp 98.9 F (37.2 C)   Resp 16   Ht 5\' 8"  (1.727 m)    Wt 115 lb (52.2 kg)   SpO2 100%   BMI 17.49 kg/m   Physical Exam awake, answering questions appropriately.  Chest clear to auscultation bilaterally.  Heart with regular rate and rhythm, positive murmur.  Abdomen soft, positive bowel sounds, mild lower abdominal tenderness to palpation.  No lower extremity edema.  Imaging: CT ABDOMEN PELVIS W CONTRAST  Result Date: 01/30/2020 CLINICAL DATA:  Nausea and vomiting. EXAM: CT ABDOMEN AND PELVIS WITH CONTRAST TECHNIQUE: Multidetector CT imaging of the abdomen and pelvis was performed using the standard protocol following bolus administration of intravenous contrast. CONTRAST:  112mL OMNIPAQUE IOHEXOL 300 MG/ML  SOLN COMPARISON:  January 11, 2020 FINDINGS: Lower chest: The lung bases are clear. The heart size is normal. Hepatobiliary: Hepatic cysts are noted. Status post cholecystectomy.There is no biliary ductal dilation. Pancreas: Normal contours without ductal dilatation. No peripancreatic fluid collection. Spleen: Unremarkable. Adrenals/Urinary Tract: --Adrenal glands: Unremarkable. --Right kidney/ureter: No hydronephrosis or radiopaque kidney stones. --Left kidney/ureter: No hydronephrosis or radiopaque kidney stones. --Urinary bladder: Unremarkable. Stomach/Bowel: --Stomach/Duodenum: No hiatal hernia or other gastric abnormality. Normal duodenal course and caliber. --Small bowel: The previously demonstrated small-bowel obstruction appears to resolved. These there is a surgical anastomosis in the low midline abdomen. --Colon: There is some questionable wall thickening of the sigmoid colon. There may be some wall thickening at the hepatic flexure. There is an enhancing mass in the right lower quadrant measuring approximately 1.7 cm (axial series 2, image 47). This appears to be present on recent prior studies but is difficult to appreciate on studies dating back to 2020. --Appendix: Normal. Vascular/Lymphatic: Atherosclerotic calcification is present within the  non-aneurysmal abdominal aorta, without hemodynamically significant stenosis. 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 There are enlarged pelvic veins. --No retroperitoneal lymphadenopathy. --No mesenteric lymphadenopathy. --No pelvic or inguinal lymphadenopathy. Reproductive: Unremarkable Other: The patient's pelvis there has been interval development of a complex collection measuring approximately 9.5 x 8.6 by 7.1 cm. This collection contains hyperdense components in the nondependent portion. This mass abuts the surgical anastomosis. There is a midline incision. Body wall edema is noted. Musculoskeletal. No acute displaced fractures. IMPRESSION: 1. New pelvic mass measuring approximately 9.5 cm abutting the new small bowel surgical anastomosis. This is favored to represent a postoperative hematoma. A developing abscess or contained perforation seems less likely given the lack air within this collection. 2. No evidence for small bowel obstruction. 3. Scattered areas of mild colonic wall thickening is favored to be secondary to underdistention, however mild colitis is not excluded. 4. Enhancing 1.7 cm mass in the right lower quadrant may represent an enlarged lymph node. A nonemergent follow-up examination with IV and oral contrast would be useful for further characterization. 5. Prominent pelvic veins which are nonspecific but can be seen in patients with pelvic congestion syndrome. Aortic Atherosclerosis (ICD10-I70.0). Electronically Signed   By: Constance Holster M.D.   On: 01/30/2020 02:00    Labs:  CBC: Recent Labs    01/21/20 0510 01/22/20 0447 01/23/20 0433 01/29/20 2340  WBC 13.4* 13.2* 12.1* 12.6*  HGB 9.3* 9.0* 8.8* 11.3*  HCT 27.6* 26.8* 27.1* 34.1*  PLT  234 334 447* 518*    COAGS: Recent Labs    09/11/19 1006 01/12/20 0626 01/30/20 0505  INR 1.2 1.0 1.0    BMP: Recent Labs    01/20/20 0453 01/21/20 0510 01/22/20 0447 01/29/20 2340  NA 134* 139 137  141  K 4.1 3.9 3.6 2.6*  CL 107 110 109 107  CO2 22 21* 20* 25  GLUCOSE 79 67* 84 106*  BUN 15 11 10  5*  CALCIUM 8.1* 8.4* 8.2* 8.9  CREATININE 0.59 0.59 0.62 0.62  GFRNONAA >60 >60 >60 >60  GFRAA >60 >60 >60 >60    LIVER FUNCTION TESTS: Recent Labs    01/14/20 0341 01/15/20 0333 01/16/20 0347 01/29/20 2340  BILITOT 0.5 0.4 0.3 0.7  AST 14* 13* 11* 12*  ALT 10 8 7 8   ALKPHOS 49 45 39 68  PROT 4.7* 4.4* 4.3* 6.1*  ALBUMIN 2.2* 1.8* 1.8* 2.5*    Assessment and Plan: Pt s/p drainage of post cholecystectomy gallbladder fossa and pelvic fluid collections on 09/11/2019.  Drains were subsequently  removed by surgical team.  She has a history of recurrent small bowel obstruction and is status post exploratory lap with small bowel resection and lysis of adhesions due to high-grade SBO on 01/12/2020.  She now presents with persistent abdominal/pelvic pain, recent nausea and vomiting and latest CT abdomen pelvis revealing complex fluid collection in the pelvis which abuts the surgical anastomosis concerning for abscess versus hematoma.  Also noted was enhancing 1.7 cm mass in the right lower quadrant which may represent an enlarged lymph node.  Request now received from surgery for image guided aspiration/drainage of the pelvic fluid collection.  She is currently afebrile, WBC 12.6, hemoglobin 11.3.  Imaging studies were reviewed by Dr. 09/13/2019.Risks and benefits discussed with the patient/daughter 01/14/2020 including bleeding, infection, damage to adjacent structures, bowel perforation/fistula connection, and sepsis.  All of the patient's questions were answered, patient is agreeable to proceed. Consent signed and in chart.  Procedure scheduled for today   Electronically Signed: D. Deanne Coffer, PA-C 01/30/2020, 9:38 AM   I spent a total of 25 minutes at the the patient's bedside AND on the patient's hospital floor or unit, greater than 50% of which was counseling/coordinating care for  CT-guided aspiration/drainage of pelvic fluid collection    Patient ID: Samantha Gonzales, female   DOB: 16-Jan-1941, 79 y.o.   MRN: [id number]

## 2020-01-30 NOTE — Progress Notes (Signed)
[facility] Progress Note     Subjective: CC-  Continues to complain of abdominal pain. Pain is diffuse. Denies n/v. States that she was moving her bowels normally at home. She has not had much of an appetite and has not been eating much at home.  Objective: Vital signs in last 24 hours: Temp:  [98.9 F (37.2 C)-99.1 F (37.3 C)] 98.9 F (37.2 C) (04/05 0510) Pulse Rate:  [77-86] 81 (04/05 0735) Resp:  [0-20] 16 (04/05 0510) BP: (128-168)/(56-76) 128/56 (04/05 0735) SpO2:  [98 %-100 %] 100 % (04/05 0735) Weight:  [52.2 kg] 52.2 kg (04/04 2225) Last BM Date: 01/29/20  Intake/Output from previous day: 04/04 0701 - 04/05 0700 In: 1362.2 [I.V.:171.2; IV Piggyback:1191.1] Out: 0  Intake/Output this shift: No intake/output data recorded.  PE: Gen:  Alert, NAD HEENT: EOM's intact, pupils equal and round Card:  RRR Pulm:  CTAB, no W/R/R, rate and effort normal Abd: Soft, ND, +BS, diffuse tenderness, open midline incision clean without erythema or drainage  Lab Results:  Recent Labs    01/29/20 2340  WBC 12.6*  HGB 11.3*  HCT 34.1*  PLT 518*   BMET Recent Labs    01/29/20 2340  NA 141  K 2.6*  CL 107  CO2 25  GLUCOSE 106*  BUN 5*  CREATININE 0.62  CALCIUM 8.9   PT/INR Recent Labs    01/30/20 0505  LABPROT 13.1  INR 1.0   CMP     Component Value Date/Time   NA 141 01/29/2020 2340   K 2.6 (LL) 01/29/2020 2340   CL 107 01/29/2020 2340   CO2 25 01/29/2020 2340   GLUCOSE 106 (H) 01/29/2020 2340   BUN 5 (L) 01/29/2020 2340   CREATININE 0.62 01/29/2020 2340   CALCIUM 8.9 01/29/2020 2340   PROT 6.1 (L) 01/29/2020 2340   ALBUMIN 2.5 (L) 01/29/2020 2340   AST 12 (L) 01/29/2020 2340   ALT 8 01/29/2020 2340   ALKPHOS 68 01/29/2020 2340   BILITOT 0.7 01/29/2020 2340   GFRNONAA >60 01/29/2020 2340   GFRAA >60 01/29/2020 2340   Lipase     Component Value Date/Time   LIPASE 35 01/29/2020 2340       Studies/Results: CT ABDOMEN PELVIS W  CONTRAST  Result Date: 01/30/2020 CLINICAL DATA:  Nausea and vomiting. EXAM: CT ABDOMEN AND PELVIS WITH CONTRAST TECHNIQUE: Multidetector CT imaging of the abdomen and pelvis was performed using the standard protocol following bolus administration of intravenous contrast. CONTRAST:  OMNIPAQUE IOHEXOL 300 MG/ML  SOLN COMPARISON:  January 11, 2020 FINDINGS: Lower chest: The lung bases are clear. The heart size is normal. Hepatobiliary: Hepatic cysts are noted. Status post cholecystectomy.There is no biliary ductal dilation. Pancreas: Normal contours without ductal dilatation. No peripancreatic fluid collection. Spleen: Unremarkable. Adrenals/Urinary Tract: --Adrenal glands: Unremarkable. --Right kidney/ureter: No hydronephrosis or radiopaque kidney stones. --Left kidney/ureter: No hydronephrosis or radiopaque kidney stones. --Urinary bladder: Unremarkable. Stomach/Bowel: --Stomach/Duodenum: No hiatal hernia or other gastric abnormality. Normal duodenal course and caliber. --Small bowel: The previously demonstrated small-bowel obstruction appears to resolved. These there is a surgical anastomosis in the low midline abdomen. --Colon: There is some questionable wall thickening of the sigmoid colon. There may be some wall thickening at the hepatic flexure. There is an enhancing mass in the right lower quadrant measuring approximately 1.7 cm (axial series 2, image 47). This appears to be present on recent prior studies but is difficult to appreciate on studies dating back to 2020. --Appendix: Normal.  Vascular/Lymphatic: Atherosclerotic calcification is present within the non-aneurysmal abdominal aorta, without hemodynamically significant stenosis. 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 There are enlarged pelvic veins. --No retroperitoneal lymphadenopathy. --No mesenteric lymphadenopathy. --No pelvic or inguinal lymphadenopathy. Reproductive: Unremarkable Other: The patient's pelvis there has  been interval development of a complex collection measuring approximately 9.5 x 8.6 by 7.1 cm. This collection contains hyperdense components in the nondependent portion. This mass abuts the surgical anastomosis. There is a midline incision. Body wall edema is noted. Musculoskeletal. No acute displaced fractures. IMPRESSION: 1. New pelvic mass measuring approximately 9.5 cm abutting the new small bowel surgical anastomosis. This is favored to represent a postoperative hematoma. A developing abscess or contained perforation seems less likely given the lack air within this collection. 2. No evidence for small bowel obstruction. 3. Scattered areas of mild colonic wall thickening is favored to be secondary to underdistention, however mild colitis is not excluded. 4. Enhancing 1.7 cm mass in the right lower quadrant may represent an enlarged lymph node. A nonemergent follow-up examination with IV and oral contrast would be useful for further characterization. 5. Prominent pelvic veins which are nonspecific but can be seen in patients with pelvic congestion syndrome. Aortic Atherosclerosis (ICD10-I70.0). Electronically Signed   By: Constance Holster M.D.   On: 01/30/2020 02:00    Anti-infectives: Anti-infectives (From admission, onward)   None       Assessment/Plan Chronic abdominal pain- hydrocodoneuse since 01/25/18 Hypertension - home med LVH Mitral Murmur Aortic sclerosis/bruit Hyperlipidemia Severe protein calorie malnutrition - check prealbumin Chronic anemia - Hgb 11.3 (up since discharge 1 week ago)  Recurrent SBO S/P ex-lap with small bowel resection and LOA 01/12/20 Dr. Brantley Stage --Readmit 01/29/2020-- Pelvic fluid collection 9.5cm, suspect hematoma - IR consult for drain placement. Keep NPO for possible procedure today, ok for regular diet after procedure. No antibiotics at this time, will follow culture. Give 500cc fluid bolus for lactic acid 2.1.  ID - none FEN - IVF, NPO VTE - SCDs,  hold for possible procedure Foley - none   LOS: 0 days    Wellington Hampshire, [facility] 01/30/2020, 9:50 AM Please see Amion for pager number during day hours 7:00am-4:30pm

## 2020-01-30 NOTE — Progress Notes (Signed)
MD Derrell Lolling was notified of patient's lactic acid of 2.1

## 2020-01-30 NOTE — Consult Note (Signed)
WOC Nurse Consult Note: Reason for Consult:Moisture associated skin damage to bilateral gluteal folds.  Consistent with incontinence and wearing briefs in bed at home.   Wound type:MASD Pressure Injury POA: NA Measurement: each gluteal fold has 2 cm x 2 cm x 0.1 cm  Wound DQQ:IWLN and moist Drainage (amount, consistency, odor) scant weeping Periwound:intact Dressing procedure/placement/frequency: Cleanse buttocks with soap and water and pat dry.  Apply Gerhardts butt paste twice daily and PRN soilage.   Will not follow at this time.  Please re-consult if needed.  Maple Hudson MSN, RN, FNP-BC CWON Wound, Ostomy, Continence Nurse Pager [phone]

## 2020-01-30 NOTE — Plan of Care (Signed)

## 2020-01-30 NOTE — Progress Notes (Signed)
Initial Nutrition Assessment  DOCUMENTATION CODES:   Severe malnutrition in context of chronic illness, Underweight  INTERVENTION:  - diet advancement as medically feasible. - will monitor for need for nutrition support.    NUTRITION DIAGNOSIS:   Severe Malnutrition related to chronic illness as evidenced by moderate fat depletion, moderate muscle depletion, severe fat depletion, severe muscle depletion.  GOAL:   Patient will meet greater than or equal to 90% of their needs  MONITOR:   Diet advancement, Labs, Weight trends  REASON FOR ASSESSMENT:   Malnutrition Screening Tool  ASSESSMENT:   79 year-old female with a history of HTN, HLD, mild mitral regurgitation, and ex lap with bowel resection, LOAs d/t SBO on 01/12/20. Patient was discharged home but continued to have abdominal pain and began having N/V again. CT in the ED showed 9.5 cm intra-abdominal mass significant for hematoma vs abscess vs leak near anastomosis site.  She has been NPO since admission. Patient is known to this RD from recent admission. During that admission she was on TPN post-op and was advanced to Soft diet. This RD most recently saw and spoke with her on 3/25 at which time patient was eating and drinking very poorly. She states that since discharge, she has mainly been eating bites at meals and sipping on liquids throughout the day. Intakes do lead to abdominal pain and nausea with intermittent associated vomiting.   Per chart review, weight yesterday was 115 lb and weight on 3/4 was 118 lb. This indicates 3 lb weight loss (2.5% body weight) in the past 1 month.   Per notes: - IR consulted for drain placement due to pelvic fluid collection - plan for Regular diet after drain placement   Labs reviewed; K: 2.6 mmol/l, BUN: 5 mg/dl. Medications reviewed; 40 mg IV protonix/day, 10 mEq IV KCl x2 runs 4/5. IVF; D5-NS @ 100 ml/hr (408 kcal).     NUTRITION - FOCUSED PHYSICAL EXAM:    Most Recent Value   Orbital Region  Moderate depletion  Upper Arm Region  Severe depletion  Thoracic and Lumbar Region  Severe depletion  Buccal Region  Moderate depletion  Temple Region  Moderate depletion  Clavicle Bone Region  Severe depletion  Clavicle and Acromion Bone Region  Severe depletion  Scapular Bone Region  Moderate depletion  Dorsal Hand  Moderate depletion  Patellar Region  Moderate depletion  Anterior Thigh Region  Unable to assess  Posterior Calf Region  Severe depletion  Edema (RD Assessment)  None  Hair  Reviewed  Eyes  Reviewed  Mouth  Reviewed  Skin  Reviewed  Nails  Reviewed       Diet Order:   Diet Order            Diet NPO time specified Except for: Sips with Meds  Diet effective now              EDUCATION NEEDS:   Not appropriate for education at this time  Skin:  Skin Assessment: Skin Integrity Issues: Skin Integrity Issues:: Stage II, Incisions Stage II: coccyx Incisions: abdomen, closed (3/18)  Last BM:  4/4  Height:   Ht Readings from Last 1 Encounters:  01/29/20 5\' 8"  (1.727 m)    Weight:   Wt Readings from Last 1 Encounters:  01/29/20 52.2 kg    Ideal Body Weight:  63.6 kg  BMI:  Body mass index is 17.49 kg/m.  Estimated Nutritional Needs:   Kcal:  1830-2090 kcal  Protein:  95-110 grams  Fluid:  >/= 2 L/day     Jarome Matin, MS, RD, LDN, CNSC Inpatient Clinical Dietitian RD pager # available in [facility]  After hours/weekend pager # available in [facility]

## 2020-01-30 NOTE — ED Notes (Addendum)
Date and time results received: 01/30/20 12:14 AM  Test: Potassium Critical Value: 2.6  Name of Provider Notified: Rubin Payor, MD  Orders Received? Or Actions Taken?:

## 2020-01-30 NOTE — Procedures (Signed)
  Procedure: CT R transgluteal pelvic abscess drain placement 63F EBL:   minimal Complications:  none immediate  See full dictation in [employer].  Thora Lance MD Main # [phone] Pager  [phone]

## 2020-01-30 NOTE — Progress Notes (Signed)
CRITICAL VALUE ALERT  Critical Value: Lactic Acid 2.1  Date & Time Notied:  01/30/2020 @ 0535  Provider Notified: Primary nurse Ricki Rodriguez made aware and will notify on-call provider  Orders Received/Actions taken: will await any further orders

## 2020-01-30 NOTE — ED Provider Notes (Signed)
[facility]-EMERGENCY DEPT Provider Note   CSN: [id number] Arrival date & time: 01/29/20  2215     History Chief Complaint  Patient presents with  . Abdominal Pain    Samantha Gonzales is a 79 y.o. female   The history is provided by the patient. No language interpreter was used.  Abdominal Pain Pain location:  Generalized Pain quality: cramping, sharp and stabbing   Pain radiates to:  Does not radiate Pain severity:  Severe Onset quality:  Unable to specify Duration:  4 days Timing:  Constant Progression:  Worsening Chronicity:  New Context: previous surgery   Relieved by:  Nothing Worsened by:  Movement and palpation Associated symptoms: nausea and vomiting   Associated symptoms: no chest pain, no chills, no constipation, no diarrhea, no dysuria, no fever, no hematemesis, no hematochezia, no hematuria, no shortness of breath, no vaginal bleeding and no vaginal discharge   Risk factors: being elderly and recent hospitalization        Past Medical History:  Diagnosis Date  . Hyperlipidemia   . Hypertension   . Syncope 10/27/2019    Patient Active Problem List   Diagnosis Date Noted  . 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 Protein-calorie malnutrition, severe 01/19/2020  . Murmur, heart 01/11/2020  . SBO (small bowel obstruction) (HCC) 01/11/2020  . Small bowel obstruction (HCC) 12/29/2019  . Pressure injury of skin 10/29/2019  . Colitis 10/28/2019  . AKI (acute kidney injury) (HCC) 10/27/2019  . Syncope 10/27/2019  . Hypokalemia 10/27/2019  . Essential hypertension 10/27/2019  . Malnutrition of moderate degree 09/07/2019  . Cholecystitis 09/04/2019  . Altered mental status 01/18/2018  . Altered mental state 01/17/2018    Past Surgical History:  Procedure Laterality Date  . CHOLECYSTECTOMY N/A 09/05/2019   Procedure: LAPAROSCOPIC CHOLECYSTECTOMY WITH INTRAOPERATIVE CHOLANGIOGRAM;  Surgeon: Harriette Bouillonornett, Thomas, MD;  Location: [facility] OR;   Service: General;  Laterality: N/A;  . ERCP N/A 09/07/2019   Procedure: ENDOSCOPIC RETROGRADE CHOLANGIOPANCREATOGRAPHY (ERCP);  Surgeon: Vida RiggerMagod, Marc, MD;  [facility];  Service: Endoscopy;  Laterality: N/A;  . LAPAROTOMY N/A 01/12/2020   Procedure: EXPLORATORY LAPAROTOMY LYSIS OF ADHESIONS AND SMALL BOWEL OBSTRUCTION;  Surgeon: Harriette Bouillonornett, Thomas, MD;  Location: [facility] ORS;  Service: General;  Laterality: N/A;  . PANCREATIC STENT PLACEMENT  09/07/2019   Procedure: PANCREATIC STENT PLACEMENT;  Surgeon: Vida RiggerMagod, Marc, MD;  [facility];  Service: Endoscopy;;  . REMOVAL OF STONES  09/07/2019   Procedure: REMOVAL OF STONES;  Surgeon: Vida RiggerMagod, Marc, MD;  [facility];  Service: Endoscopy;;  . Dennison MascotSPHINCTEROTOMY  09/07/2019   Procedure: Dennison MascotSPHINCTEROTOMY;  Surgeon: Vida RiggerMagod, Marc, MD;  [facility];  Service: Endoscopy;;  . TUBAL LIGATION       OB History   No obstetric history on file.     Family History  Problem Relation Age of Onset  . CAD Neg Hx     Social History   Tobacco Use  . Smoking status: Never Smoker  . Smokeless tobacco: Current User    Types: Chew  Substance Use Topics  . Alcohol use: No  . Drug use: No    Home Medications Prior to Admission medications   Medication Sig Start Date End Date Taking? Authorizing Provider  amLODipine (NORVASC) 10 MG tablet Take 10 mg by mouth daily. 09/25/19  Yes [provider]  docusate sodium (COLACE) 100 MG capsule Take 1 capsule (100 mg total) by mouth 2 (two) times daily as needed for mild constipation. 01/19/20  Yes Rayburn, Alphonsus Sias, PA-C  HYDROcodone-acetaminophen (NORCO) 10-325 MG tablet Take 1 tablet by mouth every 6 (six) hours as needed for severe pain. 01/23/20  Yes Sherrie George, PA-C  Multiple Vitamin (MULTIVITAMIN WITH MINERALS) TABS tablet Take 1 tablet by mouth daily. 01/19/20  Yes Rayburn, Tresa Endo A, PA-C  rosuvastatin (CRESTOR) 20 MG tablet Take 20 mg by mouth daily. 07/30/18  Yes [provider]  ferrous sulfate 325 (65 FE) MG tablet Take 1 tablet (325 mg total) by mouth daily with breakfast. 01/20/20   Rayburn, Alphonsus Sias, PA-C  folic acid (FOLVITE) 1 MG tablet Take 1 tablet (1 mg total) by mouth daily. 01/20/20   Noralee Stain, DO  gabapentin (NEURONTIN) 100 MG capsule Take 2 capsules (200 mg total) by mouth 3 (three) times daily. 01/23/20   Sherrie George, PA-C  ibuprofen (ADVIL) 200 MG tablet You can take 2 tablets every 6 hours as needed for pain.  Follow package directions.  You can buy this over-the-counter at any drugstore. 01/23/20   Sherrie George, PA-C  metroNIDAZOLE (FLAGYL) 500 MG tablet Take 1 tablet (500 mg total) by mouth every 8 (eight) hours. 01/23/20   Sherrie George, PA-C    Allergies    Penicillins  Review of Systems   Review of Systems  Constitutional: Negative for chills and fever.  HENT: Negative for congestion.   Eyes: Negative for visual disturbance.  Respiratory: Negative for shortness of breath.   Cardiovascular: Negative for chest pain.  Gastrointestinal: Positive for abdominal pain, nausea and vomiting. Negative for blood in stool, constipation, diarrhea, hematemesis and hematochezia.  Genitourinary: Negative for dysuria, hematuria, vaginal bleeding and vaginal discharge.  Musculoskeletal: Negative for arthralgias, gait problem, myalgias and neck stiffness.  Skin: Negative for wound.  Neurological: Negative for seizures, syncope, weakness and numbness.    Physical Exam Updated Vital Signs BP (!) 150/72 (BP Location: Left Arm)   Pulse 78   Temp 99.1 F (37.3 C) (Oral)   Resp 16   Ht 5\' 8"  (1.727 m)   Wt 52.2 kg   SpO2 98%   BMI 17.49 kg/m   Physical Exam Vitals and nursing note reviewed.  Constitutional:      General: She is not in acute distress.    Appearance: She is not toxic-appearing or diaphoretic.     Comments: Standing, elderly female.  Nontoxic-appearing.  HENT:     Head: Normocephalic.  Eyes:      Conjunctiva/sclera: Conjunctivae normal.  Cardiovascular:     Rate and Rhythm: Normal rate and regular rhythm.     Heart sounds: No murmur. No friction rub. No gallop.   Pulmonary:     Effort: Pulmonary effort is normal. No respiratory distress.     Breath sounds: No stridor. No wheezing, rhonchi or rales.  Chest:     Chest wall: No tenderness.  Abdominal:     General: There is no distension.     Palpations: Abdomen is soft. There is no mass.     Tenderness: There is abdominal tenderness. There is guarding. There is no right CVA tenderness, left CVA tenderness or rebound.     Hernia: No hernia is present.     Comments: Diffusely tender to palpation throughout the abdomen.  However, pain appears to be more significant in the left upper and lower abdomen as well as the right lower quadrant.  There is voluntary guarding.  Hypoactive bowel sounds in all 4 quadrants.  No CVA tenderness bilaterally.  No tenderness over McBurney's point.  There is a surgical wound noted on the right abdomen.  Bandages in place, but there is granulomatous tissue and wound appears well-healing.  She is tender to palpation diffusely around the surgical wound.  Musculoskeletal:     Cervical back: Neck supple.     Right lower leg: No edema.     Left lower leg: No edema.  Skin:    General: Skin is warm.     Findings: No rash.  Neurological:     Mental Status: She is alert.  Psychiatric:        Behavior: Behavior normal.     ED Results / Procedures / Treatments   Labs (all labs ordered are listed, but only abnormal results are displayed) Labs Reviewed  URINALYSIS, ROUTINE W REFLEX MICROSCOPIC - Abnormal; Notable for the following components:      Result Value   APPearance HAZY (*)    Hgb urine dipstick MODERATE (*)    Ketones, ur 5 (*)    Protein, ur 30 (*)    Leukocytes,Ua SMALL (*)    Bacteria, UA RARE (*)    All other components within normal limits  CBC WITH DIFFERENTIAL/PLATELET - Abnormal; Notable  for the following components:   WBC 12.6 (*)    RBC 3.44 (*)    Hemoglobin 11.3 (*)    HCT 34.1 (*)    Platelets 518 (*)    Neutro Abs 11.2 (*)    Lymphs Abs 0.6 (*)    Abs Immature Granulocytes 0.08 (*)    All other components within normal limits  COMPREHENSIVE METABOLIC PANEL - Abnormal; Notable for the following components:   Potassium 2.6 (*)    Glucose, Bld 106 (*)    BUN 5 (*)    Total Protein 6.1 (*)    Albumin 2.5 (*)    AST 12 (*)    All other components within normal limits  SARS CORONAVIRUS 2 (TAT 6-24 HRS)  LACTIC ACID, PLASMA  LIPASE, BLOOD  LACTIC ACID, PLASMA  MAGNESIUM  PROTIME-INR    EKG EKG Interpretation  Date/Time:  Monday January 30 2020 01:47:00 EDT Ventricular Rate:  76 PR Interval:    QRS Duration: 66 QT Interval:  423 QTC Calculation: 476 R Axis:   65 Text Interpretation: Sinus rhythm Atrial premature complex Anterior infarct, old Similar to previous EKGs Confirmed by Rochele Raring [phone]) on 01/30/2020 2:02:07 AM   Radiology CT ABDOMEN PELVIS W CONTRAST  Result Date: 01/30/2020 CLINICAL DATA:  Nausea and vomiting. EXAM: CT ABDOMEN AND PELVIS WITH CONTRAST TECHNIQUE: Multidetector CT imaging of the abdomen and pelvis was performed using the standard protocol following bolus administration of intravenous contrast. CONTRAST:  OMNIPAQUE IOHEXOL 300 MG/ML  SOLN COMPARISON:  January 11, 2020 FINDINGS: Lower chest: The lung bases are clear. The heart size is normal. Hepatobiliary: Hepatic cysts are noted. Status post cholecystectomy.There is no biliary ductal dilation. Pancreas: Normal contours without ductal dilatation. No peripancreatic fluid collection. Spleen: Unremarkable. Adrenals/Urinary Tract: --Adrenal glands: Unremarkable. --Right kidney/ureter: No hydronephrosis or radiopaque kidney stones. --Left kidney/ureter: No hydronephrosis or radiopaque kidney stones. --Urinary bladder: Unremarkable. Stomach/Bowel: --Stomach/Duodenum: No hiatal hernia or  other gastric abnormality. Normal duodenal course and caliber. --Small bowel: The previously demonstrated small-bowel obstruction appears to resolved. These there is a surgical anastomosis in the low midline abdomen. --Colon: There is some questionable wall thickening of the sigmoid colon. There may be some wall thickening at the hepatic flexure. There is an enhancing mass in the right lower quadrant measuring approximately  1.7 cm (axial series 2, image 47). This appears to be present on recent prior studies but is difficult to appreciate on studies dating back to 2020. --Appendix: Normal. Vascular/Lymphatic: Atherosclerotic calcification is present within the non-aneurysmal abdominal aorta, without hemodynamically significant stenosis. 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 There are enlarged pelvic veins. --No retroperitoneal lymphadenopathy. --No mesenteric lymphadenopathy. --No pelvic or inguinal lymphadenopathy. Reproductive: Unremarkable Other: The patient's pelvis there has been interval development of a complex collection measuring approximately 9.5 x 8.6 by 7.1 cm. This collection contains hyperdense components in the nondependent portion. This mass abuts the surgical anastomosis. There is a midline incision. Body wall edema is noted. Musculoskeletal. No acute displaced fractures. IMPRESSION: 1. New pelvic mass measuring approximately 9.5 cm abutting the new small bowel surgical anastomosis. This is favored to represent a postoperative hematoma. A developing abscess or contained perforation seems less likely given the lack air within this collection. 2. No evidence for small bowel obstruction. 3. Scattered areas of mild colonic wall thickening is favored to be secondary to underdistention, however mild colitis is not excluded. 4. Enhancing 1.7 cm mass in the right lower quadrant may represent an enlarged lymph node. A nonemergent follow-up examination with IV and oral contrast would  be useful for further characterization. 5. Prominent pelvic veins which are nonspecific but can be seen in patients with pelvic congestion syndrome. Aortic Atherosclerosis (ICD10-I70.0). Electronically Signed   By: Constance Holster M.D.   On: 01/30/2020 02:00    Procedures Procedures (including critical care time)  Medications Ordered in ED Medications  potassium chloride 10 mEq in 100 mL IVPB (10 mEq Intravenous New Bag/Given 01/30/20 0248)  sodium chloride (PF) 0.9 % injection (has no administration in time range)  amLODipine (NORVASC) tablet 10 mg (has no administration in time range)  rosuvastatin (CRESTOR) tablet 20 mg (has no administration in time range)  dextrose 5 %-0.9 % sodium chloride infusion (has no administration in time range)  ketorolac (TORADOL) 30 MG/ML injection 30 mg (has no administration in time range)  oxyCODONE (Oxy IR/ROXICODONE) immediate release tablet 5-10 mg (has no administration in time range)  ondansetron (ZOFRAN-ODT) disintegrating tablet 4 mg (has no administration in time range)    Or  ondansetron (ZOFRAN) injection 4 mg (has no administration in time range)  hydrALAZINE (APRESOLINE) injection 10 mg (has no administration in time range)  pantoprazole (PROTONIX) injection 40 mg (has no administration in time range)  fentaNYL (SUBLIMAZE) injection 100 mcg (100 mcg Intravenous Given 01/29/20 2359)  sodium chloride 0.9 % bolus 1,000 mL (1,000 mLs Intravenous New Bag/Given 01/29/20 2358)  ondansetron (ZOFRAN) injection 4 mg (4 mg Intravenous Given 01/29/20 2359)  morphine 2 MG/ML injection 2 mg (2 mg Intravenous Given 01/30/20 0146)  iohexol (OMNIPAQUE) 300 MG/ML solution 100 mL (100 mLs Intravenous Contrast Given 01/30/20 0128)  fentaNYL (SUBLIMAZE) injection 25 mcg (25 mcg Intravenous Given 01/30/20 0240)    ED Course  I have reviewed the triage vital signs and the nursing notes.  Pertinent labs & imaging results that were available during my care of the patient  were reviewed by me and considered in my medical decision making (see chart for details).  Clinical Course as of Jan 30 320  Mon Jan 30, 2020  4098 Updated the patient after speaking with Dr. Rosendo Gros with general surgery.  Also discussed that Dr. Rosendo Gros has recommended pain management with oral pain medication.  Attempted to contact the patient's daughter, Butch Penny, with no success.    [  MM]    Clinical Course User Index [MM] Nadalyn Deringer, Coral Else, PA-C   MDM Rules/Calculators/A&P                      79 year old female with history of SBO, HTN, LVH, mitral valve murmur, aortic sclerosis, HLD, and acute on chronic anemia who presents to the emergency department by EMS from home with a chief complaint of abdominal pain.  The patient endorses diffuse, constant, worsening, nonradiating abdominal pain for the last 4 days.  Pain is crampy, sharp, and stabbing.  Reports that she had one episode of nonbloody, nonbilious vomiting earlier today.  She denies constipation and has been passing flatus.  Last bowel movement was earlier today.  She denies fever, chills, diarrhea, dysuria, hematuria, vaginal pain or discharge, cough, shortness of breath, or chest pain.   The patient was admitted from 3/17-25 for recurrent SBO and underwent exploratory laparotomy with small bowel resection and lysis of adhesions.  She developed a mild ileus postoperatively that improved.  She was weaned off TPN.  Postoperative course was complicated by issues with pain control and patient has been on chronic pain medication for chronic abdominal pain since 2019.  Labs are notable for hypokalemia of 2.6.  IV potassium chloride x2 initiated in the ER.  No changes noted on EKG.  She has a persistent mild leukocytosis that is unchanged from previous.  Hemoglobin is improved from last hospitalization.  We will add on magnesium level, which is pending.  Given concern for diffuse abdominal pain, considered mesenteric ischemia, but less likely as  lactate is normal.  She is having no infectious symptoms at this time.  On exam, she is diffusely tender to palpation throughout the abdomen, but her surgical wound appears to have developing granulomatous tissue and appears well-healing.  However, given recent exploratory laparotomy, will repeat CT abdomen pelvis for further evaluation.  The patient was seen and independently evaluated by Dr. Elesa Massed, attending physician.  CT with a 9.5 cm pelvic mass abutting the new small bowel surgical anastomosis that is thought to be a postoperative hematoma.  Less likely a developing abscess or contained perforation.  No evidence of small bowel obstruction.  There is concern for some mild colonic wall thickening thought to be secondary to underdistention, but mild colitis is not excluded.  There is also a new 1.67 cm mass in the right lower quadrant that may represent an enlarged lymph node.  A nonemergent follow-up CT with IV and oral contrast would be helpful for further clinical exertion.  There are also prominent pelvic veins thought to be secondary to pelvic congestion syndrome.  Given changes around recent surgical site, will consult general surgery.  Spoke with Dr. Derrell Lolling who recommends admission.  He will accept the patient for admission.    He also recommends discussing with the patient at bedside that she will be transition to oral pain medication.  This conversation was had with the patient at bedside while her nurse was present.  COVID-19 test is pending.   Of note, patient was initially alert and oriented x4 on exam.  However, when I went to update the patient regarding admission, she seemed somewhat confused.  She did not recall receiving pain medication immediately after it was received.  She does not recall telling me about her great grandchild, which we have a 5-minute conversation about earlier in the night.  No neurologic deficits.  Attempted to contact the patient's daughter for further information  unsuccessfully.  Reviewed the patient's medical record and it appears that she had an extensive work-up for altered mental status in 2019 that was thought to be secondary to vitamin B12 deficiency versus medication misuse. At that time, she was on chronic pain medication for chronic abdominal pain.  Given that she was alert and oriented prior to pain medication use in the ER, suspect this is secondary to medication. Could also consider delirium given recent hospitalization. Doubt that she is having a CVA at this time.  Head is atraumatic. No evidence of infection.  Will continue to monitor.  The patient appears reasonably stabilized for admission considering the current resources, flow, and capabilities available in the ED at this time, and I doubt any other [facility] requiring further screening and/or treatment in the ED prior to admission.  Final Clinical Impression(s) / ED Diagnoses Final diagnoses:  Abdominal hematoma  Hypokalemia    Rx / DC Orders ED Discharge Orders    None       Dimond Crotty A, PA-C 01/30/20 0321    Ward, Layla Maw, DO 01/30/20 [phone]

## 2020-01-30 NOTE — H&P (Signed)
Samantha Gonzales is an 79 y.o. female.   Chief Complaint: Abdominal pain HPI: Patient is a 79 year old female who recently underwent ex lap, bowel resection, lysis of adhesions due to SBO on 01/12/2020.  Patient was discharged home thereafter.  Patient states that she began with abdominal pain recently.  She states that she had some nausea and vomiting.  Secondary to continued abdominal pain she came back to the ER.  Upon evaluation the ER she underwent CT scan.  CT scan was significant for 9.5 cm intra-abdominal mass that was significant for hematoma versus abscess versus leak as this was alongside the recent anastomosis.  This appears to be the area of the patient's abdominal pain.  I did review the CT scan personally.  Patient's had a leukocytosis that has been steady.  Currently 12.6.  Patient's hematocrits been stable and has gone up since her discharge.  Past Medical History:  Diagnosis Date  . Hyperlipidemia   . Hypertension   . Syncope 10/27/2019    Past Surgical History:  Procedure Laterality Date  . CHOLECYSTECTOMY N/A 09/05/2019   Procedure: LAPAROSCOPIC CHOLECYSTECTOMY WITH INTRAOPERATIVE CHOLANGIOGRAM;  Surgeon: Harriette Bouillon, MD;  Location: [facility] OR;  Service: General;  Laterality: N/A;  . ERCP N/A 09/07/2019   Procedure: ENDOSCOPIC RETROGRADE CHOLANGIOPANCREATOGRAPHY (ERCP);  Surgeon: Vida Rigger, MD;  Location: [facility] ENDOSCOPY;  Service: Endoscopy;  Laterality: N/A;  . LAPAROTOMY N/A 01/12/2020   Procedure: EXPLORATORY LAPAROTOMY LYSIS OF ADHESIONS AND SMALL BOWEL OBSTRUCTION;  Surgeon: Harriette Bouillon, MD;  Location: [facility] ORS;  Service: General;  Laterality: N/A;  . PANCREATIC STENT PLACEMENT  09/07/2019   Procedure: PANCREATIC STENT PLACEMENT;  Surgeon: Vida Rigger, MD;  Location: [facility] ENDOSCOPY;  Service: Endoscopy;;  . REMOVAL OF STONES  09/07/2019   Procedure: REMOVAL OF STONES;  Surgeon: Vida Rigger, MD;  Location: [facility] ENDOSCOPY;  Service: Endoscopy;;  . Dennison Mascot  09/07/2019    Procedure: Dennison Mascot;  Surgeon: Vida Rigger, MD;  Location: [facility] ENDOSCOPY;  Service: Endoscopy;;  . TUBAL LIGATION      Family History  Problem Relation Age of Onset  . CAD Neg Hx    Social History:  reports that she has never smoked. Her smokeless tobacco use includes chew. She reports that she does not drink alcohol or use drugs.  Allergies:  Allergies  Allergen Reactions  . Penicillins Hives, Itching and Rash    Has patient had a PCN reaction causing immediate rash, facial/tongue/throat swelling, SOB or lightheadedness with hypotension: Yes Has patient had a PCN reaction causing severe rash involving mucus membranes or skin necrosis: yes Has patient had a PCN reaction that required hospitalization: unk Has patient had a PCN reaction occurring within the last 10 years: unk If all of the above answers are "NO", then may proceed with Cephalosporin use.     (Not in a hospital admission)   Results for orders placed or performed during the hospital encounter of 01/29/20 (from the past 48 hour(s))  Urinalysis, Routine w reflex microscopic     Status: Abnormal   Collection Time: 01/29/20 10:44 PM  Result Value Ref Range   Color, Urine YELLOW YELLOW   APPearance HAZY (A) CLEAR   Specific Gravity, Urine 1.030 1.005 - 1.030   pH 5.0 5.0 - 8.0   Glucose, UA NEGATIVE NEGATIVE mg/dL   Hgb urine dipstick MODERATE (A) NEGATIVE   Bilirubin Urine NEGATIVE NEGATIVE   Ketones, ur 5 (A) NEGATIVE mg/dL   Protein, ur 30 (A) NEGATIVE mg/dL   Nitrite NEGATIVE NEGATIVE  Leukocytes,Ua SMALL (A) NEGATIVE   RBC / HPF 0-5 0 - 5 RBC/hpf   WBC, UA 21-50 0 - 5 WBC/hpf   Bacteria, UA RARE (A) NONE SEEN   Squamous Epithelial / LPF 0-5 0 - 5   Mucus PRESENT    Hyaline Casts, UA PRESENT     Comment: Performed at [facility], [address] [place], [place] [address]  CBC with Differential     Status: Abnormal   Collection Time: 01/29/20 11:40 PM  Result Value Ref Range   WBC  12.6 (H) 4.0 - 10.5 K/uL   RBC 3.44 (L) 3.87 - 5.11 MIL/uL   Hemoglobin 11.3 (L) 12.0 - 15.0 g/dL   HCT 94.0 (L) 76.8 - 08.8 %   MCV 99.1 80.0 - 100.0 fL   MCH 32.8 26.0 - 34.0 pg   MCHC 33.1 30.0 - 36.0 g/dL   RDW 11.0 31.5 - 94.5 %   Platelets 518 (H) 150 - 400 K/uL   nRBC 0.0 0.0 - 0.2 %   Neutrophils Relative % 89 %   Neutro Abs 11.2 (H) 1.7 - 7.7 K/uL   Lymphocytes Relative 5 %   Lymphs Abs 0.6 (L) 0.7 - 4.0 K/uL   Monocytes Relative 5 %   Monocytes Absolute 0.6 0.1 - 1.0 K/uL   Eosinophils Relative 0 %   Eosinophils Absolute 0.0 0.0 - 0.5 K/uL   Basophils Relative 0 %   Basophils Absolute 0.0 0.0 - 0.1 K/uL   Immature Granulocytes 1 %   Abs Immature Granulocytes 0.08 (H) 0.00 - 0.07 K/uL    Comment: Performed at [facility], [address] [place], [place] [address]  Comprehensive metabolic panel     Status: Abnormal   Collection Time: 01/29/20 11:40 PM  Result Value Ref Range   Sodium 141 135 - 145 mmol/L   Potassium 2.6 (LL) 3.5 - 5.1 mmol/L    Comment: CRITICAL RESULT CALLED TO, READ BACK BY AND VERIFIED WITH: J CURETON AT 0012 ON 01/30/2020 BY MOSLEY,J    Chloride 107 98 - 111 mmol/L   CO2 25 22 - 32 mmol/L   Glucose, Bld 106 (H) 70 - 99 mg/dL    Comment: Glucose reference range applies only to samples taken after fasting for at least 8 hours.   BUN 5 (L) 8 - 23 mg/dL   Creatinine, Ser 2.44 0.44 - 1.00 mg/dL   Calcium 8.9 8.9 - 62.8 mg/dL   Total Protein 6.1 (L) 6.5 - 8.1 g/dL   Albumin 2.5 (L) 3.5 - 5.0 g/dL   AST 12 (L) 15 - 41 U/L   ALT 8 0 - 44 U/L   Alkaline Phosphatase 68 38 - 126 U/L   Total Bilirubin 0.7 0.3 - 1.2 mg/dL   GFR calc non Af Amer >60 >60 mL/min   GFR calc Af Amer >60 >60 mL/min   Anion gap 9 5 - 15    Comment: Performed at [facility], [address], [place], [place] [address]  Lactic acid, plasma     Status: None   Collection Time: 01/29/20 11:40 PM  Result Value Ref Range   Lactic Acid, Venous  1.1 0.5 - 1.9 mmol/L    Comment: Performed at [facility], [address], [place], [place] [address]  Lipase, blood     Status: None   Collection Time: 01/29/20 11:40 PM  Result Value Ref Range   Lipase 35 11 - 51 U/L  Comment: Performed at [facility], [place] [address] [place], [place] [address]   CT ABDOMEN PELVIS W CONTRAST  Result Date: 01/30/2020 CLINICAL DATA:  Nausea and vomiting. EXAM: CT ABDOMEN AND PELVIS WITH CONTRAST TECHNIQUE: Multidetector CT imaging of the abdomen and pelvis was performed using the standard protocol following bolus administration of intravenous contrast. CONTRAST:  193mL OMNIPAQUE IOHEXOL 300 MG/ML  SOLN COMPARISON:  January 11, 2020 FINDINGS: Lower chest: The lung bases are clear. The heart size is normal. Hepatobiliary: Hepatic cysts are noted. Status post cholecystectomy.There is no biliary ductal dilation. Pancreas: Normal contours without ductal dilatation. No peripancreatic fluid collection. Spleen: Unremarkable. Adrenals/Urinary Tract: --Adrenal glands: Unremarkable. --Right kidney/ureter: No hydronephrosis or radiopaque kidney stones. --Left kidney/ureter: No hydronephrosis or radiopaque kidney stones. --Urinary bladder: Unremarkable. Stomach/Bowel: --Stomach/Duodenum: No hiatal hernia or other gastric abnormality. Normal duodenal course and caliber. --Small bowel: The previously demonstrated small-bowel obstruction appears to resolved. These there is a surgical anastomosis in the low midline abdomen. --Colon: There is some questionable wall thickening of the sigmoid colon. There may be some wall thickening at the hepatic flexure. There is an enhancing mass in the right lower quadrant measuring approximately 1.7 cm (axial series 2, image 47). This appears to be present on recent prior studies but is difficult to appreciate on studies dating back to 2020. --Appendix: Normal. Vascular/Lymphatic: Atherosclerotic calcification is  present within the non-aneurysmal abdominal aorta, without hemodynamically significant stenosis. 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 There are enlarged pelvic veins. --No retroperitoneal lymphadenopathy. --No mesenteric lymphadenopathy. --No pelvic or inguinal lymphadenopathy. Reproductive: Unremarkable Other: The patient's pelvis there has been interval development of a complex collection measuring approximately 9.5 x 8.6 by 7.1 cm. This collection contains hyperdense components in the nondependent portion. This mass abuts the surgical anastomosis. There is a midline incision. Body wall edema is noted. Musculoskeletal. No acute displaced fractures. IMPRESSION: 1. New pelvic mass measuring approximately 9.5 cm abutting the new small bowel surgical anastomosis. This is favored to represent a postoperative hematoma. A developing abscess or contained perforation seems less likely given the lack air within this collection. 2. No evidence for small bowel obstruction. 3. Scattered areas of mild colonic wall thickening is favored to be secondary to underdistention, however mild colitis is not excluded. 4. Enhancing 1.7 cm mass in the right lower quadrant may represent an enlarged lymph node. A nonemergent follow-up examination with IV and oral contrast would be useful for further characterization. 5. Prominent pelvic veins which are nonspecific but can be seen in patients with pelvic congestion syndrome. Aortic Atherosclerosis (ICD10-I70.0). Electronically Signed   By: Constance Holster M.D.   On: 01/30/2020 02:00    Review of Systems  Constitutional: Negative for chills and fever.  HENT: Negative for ear discharge, hearing loss and sore throat.   Eyes: Negative for discharge.  Respiratory: Negative for cough and shortness of breath.   Cardiovascular: Negative for chest pain and leg swelling.  Gastrointestinal: Positive for abdominal pain. Negative for constipation, diarrhea, nausea and  vomiting.  Musculoskeletal: Negative for myalgias and neck pain.  Skin: Negative for rash.  Allergic/Immunologic: Negative for environmental allergies.  Neurological: Negative for dizziness and seizures.  Hematological: Does not bruise/bleed easily.  Psychiatric/Behavioral: Negative for suicidal ideas.  All other systems reviewed and are negative.   Blood pressure (!) 150/72, pulse 78, temperature 99.1 F (37.3 C), temperature source Oral, resp. rate 16, height 5\' 8"  (1.727 m), weight 52.2 kg, SpO2 98 %.  Physical Exam  Constitutional: She is oriented to person, place, and time. Vital signs are normal. She appears well-developed and well-nourished.  Conversant No acute distress  HENT:  Head: Normocephalic.  Eyes: Lids are normal. No scleral icterus.  Pupils are equal round and reactive No lid lag Moist conjunctiva  Neck: No tracheal tenderness present. No thyromegaly present.  No cervical lymphadenopathy  Cardiovascular: Normal rate, regular rhythm and intact distal pulses.  No murmur heard. Respiratory: Effort normal and breath sounds normal. She has no wheezes. She has no rales.  GI: Soft. There is no hepatosplenomegaly. There is abdominal tenderness (lower abd tenderness). No hernia.  Neurological: She is alert and oriented to person, place, and time.  Normal gait and station  Skin: Skin is warm. No rash noted. No cyanosis. Nails show no clubbing.  Normal skin turgor  Psychiatric: Judgment normal.  Appropriate affect     Assessment/Plan 80 year old female with lower intra-abdominal likely hematoma versus abscess versus leak. 1.  We will admit to the hospital, keep n.p.o., obtain coags. 2.  IR consult for drain. 3.  Pain control.  Axel Filler, MD 01/30/2020, 2:35 AM

## 2020-01-31 LAB — CBC
HCT: 26.4 % — ABNORMAL LOW (ref 36.0–46.0)
HCT: 27.8 % — ABNORMAL LOW (ref 36.0–46.0)
Hemoglobin: 8.6 g/dL — ABNORMAL LOW (ref 12.0–15.0)
Hemoglobin: 9.3 g/dL — ABNORMAL LOW (ref 12.0–15.0)
MCH: 32.8 pg (ref 26.0–34.0)
MCH: 33.5 pg (ref 26.0–34.0)
MCHC: 32.6 g/dL (ref 30.0–36.0)
MCHC: 33.5 g/dL (ref 30.0–36.0)
MCV: 100.8 fL — ABNORMAL HIGH (ref 80.0–100.0)
MCV: 100 fL (ref 80.0–100.0)
Platelets: 334 10*3/uL (ref 150–400)
Platelets: 333 10*3/uL (ref 150–400)
RBC: 2.62 MIL/uL — ABNORMAL LOW (ref 3.87–5.11)
RBC: 2.78 MIL/uL — ABNORMAL LOW (ref 3.87–5.11)
RDW: 15.3 % (ref 11.5–15.5)
RDW: 15.3 % (ref 11.5–15.5)
WBC: 15.1 10*3/uL — ABNORMAL HIGH (ref 4.0–10.5)
WBC: 14.1 10*3/uL — ABNORMAL HIGH (ref 4.0–10.5)
nRBC: 0 % (ref 0.0–0.2)
nRBC: 0 % (ref 0.0–0.2)

## 2020-01-31 LAB — BASIC METABOLIC PANEL
Anion gap: 8 (ref 5–15)
BUN: 7 mg/dL — ABNORMAL LOW (ref 8–23)
CO2: 21 mmol/L — ABNORMAL LOW (ref 22–32)
Calcium: 8 mg/dL — ABNORMAL LOW (ref 8.9–10.3)
Chloride: 110 mmol/L (ref 98–111)
Creatinine, Ser: 0.49 mg/dL (ref 0.44–1.00)
GFR calc Af Amer: >60 mL/min (ref >60)
GFR calc non Af Amer: >60 mL/min (ref >60)
Glucose, Bld: 113 mg/dL — ABNORMAL HIGH (ref 70–99)
Potassium: 2.5 mmol/L — CL (ref 3.5–5.1)
Sodium: 139 mmol/L (ref 135–145)

## 2020-01-31 LAB — MAGNESIUM: Magnesium: 1.4 mg/dL — ABNORMAL LOW (ref 1.7–2.4)

## 2020-01-31 LAB — PREALBUMIN: Prealbumin: 8.3 mg/dL — ABNORMAL LOW (ref 18–38)

## 2020-01-31 LAB — POTASSIUM: Potassium: 3.3 mmol/L — ABNORMAL LOW (ref 3.5–5.1)

## 2020-01-31 MED ORDER — MAGNESIUM SULFATE 50 % IJ SOLN: 2.0000 g | Freq: Once | INTRAMUSCULAR | Status: DC

## 2020-01-31 MED ORDER — FENTANYL CITRATE (PF) 100 MCG/2ML IJ SOLN
12.5000 ug | INTRAMUSCULAR | Status: DC | PRN
  Administered 2020-02-01: 100 ug via INTRAVENOUS
  Administered 2020-02-01: 12.5 ug via INTRAVENOUS
  Filled 2020-01-31 (×3): qty 2

## 2020-01-31 MED ORDER — POTASSIUM CHLORIDE 10 MEQ/100ML IV SOLN
10.0000 meq | INTRAVENOUS | Status: AC
  Administered 2020-01-31 (×3): 10 meq via INTRAVENOUS
  Filled 2020-01-31 (×2): qty 100

## 2020-01-31 MED ORDER — MAGNESIUM SULFATE 50 % IJ SOLN: 1.0000 g | Freq: Once | INTRAMUSCULAR | Status: DC

## 2020-01-31 MED ORDER — MEGESTROL ACETATE 40 MG PO TABS
160.0000 mg | ORAL_TABLET | Freq: Every day | ORAL | Status: DC
  Administered 2020-02-01: 40 mg via ORAL
  Administered 2020-02-02 – 2020-02-03 (×2): 160 mg via ORAL
  Filled 2020-01-31 (×4): qty 4

## 2020-01-31 MED ORDER — MAGNESIUM SULFATE 2 GM/50ML IV SOLN
2.0000 g | Freq: Once | INTRAVENOUS | Status: AC
  Administered 2020-01-31: 2 g via INTRAVENOUS
  Filled 2020-01-31: qty 50

## 2020-01-31 MED ORDER — METRONIDAZOLE IN NACL 5-0.79 MG/ML-% IV SOLN
500.0000 mg | Freq: Three times a day (TID) | INTRAVENOUS | Status: DC
  Administered 2020-01-31 – 2020-02-01 (×3): 500 mg via INTRAVENOUS
  Filled 2020-01-31 (×3): qty 100

## 2020-01-31 MED ORDER — HEPARIN SODIUM (PORCINE) 5000 UNIT/ML IJ SOLN
5000.0000 [IU] | Freq: Three times a day (TID) | INTRAMUSCULAR | Status: DC
  Administered 2020-01-31 – 2020-02-03 (×10): 5000 [IU] via SUBCUTANEOUS
  Filled 2020-01-31 (×10): qty 1

## 2020-01-31 MED ORDER — SODIUM CHLORIDE 0.9 % IV SOLN
2.0000 g | INTRAVENOUS | Status: DC
  Administered 2020-01-31: 2 g via INTRAVENOUS
  Filled 2020-01-31: qty 20
  Filled 2020-01-31: qty 2

## 2020-01-31 MED ORDER — POTASSIUM CHLORIDE CRYS ER 20 MEQ PO TBCR
40.0000 meq | EXTENDED_RELEASE_TABLET | Freq: Two times a day (BID) | ORAL | Status: AC
  Administered 2020-01-31 (×2): 40 meq via ORAL
  Filled 2020-01-31 (×2): qty 2

## 2020-01-31 NOTE — Progress Notes (Signed)
Referring Physician(s): Ramirez,A  Supervising Physician: Malachy Moan  Patient Status:  [facility] - In-pt  Chief Complaint: Pelvic pain/fluid collection   Subjective: Pt cont to have some pelvic/buttocks discomfort; asking for additional pain meds   Allergies: Penicillins  Medications: Prior to Admission medications   Medication Sig Start Date End Date Taking? Authorizing Provider  amLODipine (NORVASC) 10 MG tablet Take 10 mg by mouth daily. 09/25/19  Yes [provider]  docusate sodium (COLACE) 100 MG capsule Take 1 capsule (100 mg total) by mouth 2 (two) times daily as needed for mild constipation. 01/19/20  Yes Rayburn, Alphonsus Sias, PA-C  HYDROcodone-acetaminophen (NORCO) 10-325 MG tablet Take 1 tablet by mouth every 6 (six) hours as needed for severe pain. 01/23/20  Yes Sherrie George, PA-C  Multiple Vitamin (MULTIVITAMIN WITH MINERALS) TABS tablet Take 1 tablet by mouth daily. 01/19/20  Yes Rayburn, Tresa Endo A, PA-C  rosuvastatin (CRESTOR) 20 MG tablet Take 20 mg by mouth daily. 07/30/18  Yes [provider]  ferrous sulfate 325 (65 FE) MG tablet Take 1 tablet (325 mg total) by mouth daily with breakfast. 01/20/20   Rayburn, Alphonsus Sias, PA-C  folic acid (FOLVITE) 1 MG tablet Take 1 tablet (1 mg total) by mouth daily. 01/20/20   Noralee Stain, DO  gabapentin (NEURONTIN) 100 MG capsule Take 2 capsules (200 mg total) by mouth 3 (three) times daily. 01/23/20   Sherrie George, PA-C  ibuprofen (ADVIL) 200 MG tablet You can take 2 tablets every 6 hours as needed for pain.  Follow package directions.  You can buy this over-the-counter at any drugstore. 01/23/20   Sherrie George, PA-C  metroNIDAZOLE (FLAGYL) 500 MG tablet Take 1 tablet (500 mg total) by mouth every 8 (eight) hours. 01/23/20   Sherrie George, PA-C     Vital Signs: BP (!) 107/56    Pulse 76    Temp 98.7 F (37.1 C) (Oral)    Resp 18    Ht 5\' 8"  (1.727 m)    Wt 115 lb (52.2 kg)    SpO2 97%    BMI 17.49  kg/m   Physical Exam awake/alert; rt TG drain intact, OP 125 cc dark bloody fluid, drain flushed without difficulty  Imaging: CT ABDOMEN PELVIS W CONTRAST  Result Date: 01/30/2020 CLINICAL DATA:  Nausea and vomiting. EXAM: CT ABDOMEN AND PELVIS WITH CONTRAST TECHNIQUE: Multidetector CT imaging of the abdomen and pelvis was performed using the standard protocol following bolus administration of intravenous contrast. CONTRAST:  03/31/2020 OMNIPAQUE IOHEXOL 300 MG/ML  SOLN COMPARISON:  January 11, 2020 FINDINGS: Lower chest: The lung bases are clear. The heart size is normal. Hepatobiliary: Hepatic cysts are noted. Status post cholecystectomy.There is no biliary ductal dilation. Pancreas: Normal contours without ductal dilatation. No peripancreatic fluid collection. Spleen: Unremarkable. Adrenals/Urinary Tract: --Adrenal glands: Unremarkable. --Right kidney/ureter: No hydronephrosis or radiopaque kidney stones. --Left kidney/ureter: No hydronephrosis or radiopaque kidney stones. --Urinary bladder: Unremarkable. Stomach/Bowel: --Stomach/Duodenum: No hiatal hernia or other gastric abnormality. Normal duodenal course and caliber. --Small bowel: The previously demonstrated small-bowel obstruction appears to resolved. These there is a surgical anastomosis in the low midline abdomen. --Colon: There is some questionable wall thickening of the sigmoid colon. There may be some wall thickening at the hepatic flexure. There is an enhancing mass in the right lower quadrant measuring approximately 1.7 cm (axial series 2, image 47). This appears to be present on recent prior studies but is difficult to appreciate on studies dating back to 2020. --Appendix: Normal. Vascular/Lymphatic:  Atherosclerotic calcification is present within the non-aneurysmal abdominal aorta, without hemodynamically significant stenosis. 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 There are enlarged pelvic veins. --No retroperitoneal  lymphadenopathy. --No mesenteric lymphadenopathy. --No pelvic or inguinal lymphadenopathy. Reproductive: Unremarkable Other: The patient's pelvis there has been interval development of a complex collection measuring approximately 9.5 x 8.6 by 7.1 cm. This collection contains hyperdense components in the nondependent portion. This mass abuts the surgical anastomosis. There is a midline incision. Body wall edema is noted. Musculoskeletal. No acute displaced fractures. IMPRESSION: 1. New pelvic mass measuring approximately 9.5 cm abutting the new small bowel surgical anastomosis. This is favored to represent a postoperative hematoma. A developing abscess or contained perforation seems less likely given the lack air within this collection. 2. No evidence for small bowel obstruction. 3. Scattered areas of mild colonic wall thickening is favored to be secondary to underdistention, however mild colitis is not excluded. 4. Enhancing 1.7 cm mass in the right lower quadrant may represent an enlarged lymph node. A nonemergent follow-up examination with IV and oral contrast would be useful for further characterization. 5. Prominent pelvic veins which are nonspecific but can be seen in patients with pelvic congestion syndrome. Aortic Atherosclerosis (ICD10-I70.0). Electronically Signed   By: Katherine Mantle M.D.   On: 01/30/2020 02:00   CT IMAGE GUIDED DRAINAGE BY PERCUTANEOUS CATHETER  Result Date: 01/30/2020 CLINICAL DATA:  Recent lysis of adhesions for small-bowel obstruction. New loculated pelvic collection. Abdominal pain. EXAM: CT GUIDED DRAINAGE OF PELVIC ABSCESS ANESTHESIA/SEDATION: Intravenous Fentanyl and Versed 2mg  were administered as conscious sedation during continuous monitoring of the patient's level of consciousness and physiological / cardiorespiratory status by the radiology RN, with a total moderate sedation time of 17 minutes. PROCEDURE: The procedure, risks, benefits, and alternatives were  explained to the patient. Questions regarding the procedure were encouraged and answered. The patient understands and consents to the procedure. Patient placed prone. Select axial scans through the pelvis obtained. An appropriate skin entry site was determined and marked. The operative field was prepped with chlorhexidinein a sterile fashion, and a sterile drape was applied covering the operative field. A sterile gown and sterile gloves were used for the procedure. Local anesthesia was provided with 1% Lidocaine. Under CT fluoroscopic guidance, 18 gauge percutaneous entry needle advanced into the collection. Bloody purulent material could be aspirated. Amplatz guidewire advanced easily, position confirmed on CT. Tract dilated to facilitate placement 10 French pigtail drain catheter, formed centrally within the collection. 30 mL of purulent bloody material were aspirated, sent for Gram stain and culture. Catheter secured externally with 0 Prolene suture and StatLock and placed to gravity drain bag. The patient tolerated the procedure well. COMPLICATIONS: None immediate FINDINGS: Loculated cul-de-sac fluid collection was localized. 10 French pigtail drain catheter placed as above. 30 mL of bloody purulent material aspirated, sent for Gram stain and culture. IMPRESSION: 1. Technically successful CT guided pelvic abscess drain catheter placement Electronically Signed   By: M.D.   On: 01/30/2020 13:54    Labs:  CBC: Recent Labs    01/22/20 0447 01/23/20 0433 01/29/20 2340 01/31/20 0426  WBC 13.2* 12.1* 12.6* 15.1*  HGB 9.0* 8.8* 11.3* 8.6*  HCT 26.8* 27.1* 34.1* 26.4*  PLT 334 447* 518* 334    COAGS: Recent Labs    09/11/19 1006 01/12/20 0626 01/30/20 0505  INR 1.2 1.0 1.0    BMP: Recent Labs    01/21/20 0510 01/22/20 0447 01/29/20 2340  01/31/20 0426  NA 139 137 141 139  K 3.9 3.6 2.6* 2.5*  CL 110 109 107 110  CO2 21* 20* 25 21*  GLUCOSE 67* 84 106* 113*  BUN 11 10 5* 7*   CALCIUM 8.4* 8.2* 8.9 8.0*  CREATININE 0.59 0.62 0.62 0.49  GFRNONAA >60 >60 >60 >60  GFRAA >60 >60 >60 >60    LIVER FUNCTION TESTS: Recent Labs    01/14/20 0341 01/15/20 0333 01/16/20 0347 01/29/20 2340  BILITOT 0.5 0.4 0.3 0.7  AST 14* 13* 11* 12*  ALT 10 8 7 8   ALKPHOS 49 45 39 68  PROT 4.7* 4.4* 4.3* 6.1*  ALBUMIN 2.2* 1.8* 1.8* 2.5*    Assessment and Plan: Pt s/p drainage of post cholecystectomy gallbladder fossa and pelvic fluid collections on 09/11/2019.  Drains were subsequently  removed by surgical team.  She has a history of recurrent small bowel obstruction and is status post exploratory lap with small bowel resection and lysis of adhesions due to high-grade SBO on 01/12/2020; s/p drainage of loculated pelvic cul de sac fluid collection 4/5; afebrile, WBC 15.1, hemoglobin 8.6, potassium 2.5- replace, creatinine normal, drain fluid cultures pend(gm neg rods); continue drain irrigation/output monitoring; check follow-up CT within 1 week; OOB   Electronically Signed: D. Rowe Robert, PA-C 01/31/2020, 12:40 PM   I spent a total of 15 minutes at the the patient's bedside AND on the patient's hospital floor or unit, greater than 50% of which was counseling/coordinating care for pelvic fluid collection drain    Patient ID: Samantha Gonzales, female   DOB: [birth year], 79 y.o.   MRN: [id number]

## 2020-01-31 NOTE — Progress Notes (Addendum)
CC: Abdominal pain  Subjective: No real change, continues to complain of pain does not understand why it will go away.  IR drain is a dark bloody appearing fluid.  Cultures are pending.  Midline incision looks fine.  Very little recorded for p.o. intake.  Objective: Vital signs in last 24 hours: Temp:  [98 F (36.7 C)-99 F (37.2 C)] 98.7 F (37.1 C) (04/06 0606) Pulse Rate:  [75-114] 76 (04/06 0648) Resp:  [15-18] 18 (04/06 0606) BP: (106-135)/(37-63) 107/56 (04/06 0648) SpO2:  [97 %-100 %] 97 % (04/06 0648) Last BM Date: 01/29/20 180 p.o. recorded 1637 IV 100 urine recorded, voided x6 Drain 145 Stool 0 Afebrile,vital signs are stable Potassium is 2.5, magnesium 1.4  -both are being replaced WBC 15.1, hemoglobin 8.6, hematocrit 26.4 Prealbumin 8.3 Gram stain from IR drain:MODERATE GRAM NEGATIVE RODS  IDENTIFICATION AND SUSCEPTIBILITIES TO FOLLOW  CULTURE REINCUBATED FOR BETTER GROWTH  Pain control: Fentanyl 25 mcg x 5 yesterday, Toradol 30 mg x 2 yesterday, Robaxin 500 mg x 2, Versed x4 for IR drain placement oxycodone 10 mg x 3, and 5 mg x 1  Intake/Output from previous day: 04/05 0701 - 04/06 0700 In: 1822.9 [P.O.:180; I.V.:1637.9] Out: 245 [Urine:100; Drains:145] Intake/Output this shift: Total I/O In: 477 [P.O.:477] Out: 0   General appearance: alert, cooperative and no distress Resp: clear to auscultation bilaterally GI: abnormal findings:  Soft is not really tender but complains of ongoing pain.  Positive bowel sounds, midline incision is healing nicely.  Wet-to-dry dressing in place.  IR drain is a dark reddish colored fluid, looks like resolving hematoma.  Lab Results:  Recent Labs    01/29/20 2340 01/31/20 0426  WBC 12.6* 15.1*  HGB 11.3* 8.6*  HCT 34.1* 26.4*  PLT 518* 334    BMET Recent Labs    01/29/20 2340 01/31/20 0426  NA 141 139  K 2.6* 2.5*  CL 107 110  CO2 25 21*  GLUCOSE 106* 113*  BUN 5* 7*  CREATININE 0.62 0.49  CALCIUM  8.9 8.0*   PT/INR Recent Labs    01/30/20 0505  LABPROT 13.1  INR 1.0    Recent Labs  Lab 01/29/20 2340  AST 12*  ALT 8  ALKPHOS 68  BILITOT 0.7  PROT 6.1*  ALBUMIN 2.5*     Lipase     Component Value Date/Time   LIPASE 35 01/29/2020 2340     Medications: . amLODipine  10 mg Oral Daily  . feeding supplement  1 Container Oral TID BM  . feeding supplement (ENSURE ENLIVE)  237 mL Oral BID BM  . Gerhardt's butt cream   Topical BID  . multivitamin with minerals  1 tablet Oral Daily  . pantoprazole (PROTONIX) IV  40 mg Intravenous QHS  . potassium chloride  40 mEq Oral BID  . rosuvastatin  20 mg Oral q1800  . 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 Luisa Hart --Readmit 01/29/2020-- Postop Clostridium paraputrificum bacteremia  -Infectious disease reviewed with Dr. Alvino Chapel and treated with Flagyl x7 days Pelvic fluid collection 9.5cm, suspect hematoma - IR drain placed with 30 mL of bloody purulent material  aspirated and sent for Gram stain: MODERATE GRAM NEGATIVE RODS  and culture 01/30/2020 pending  ID - none FEN - IVF, NPO VTE - SCDs, restart heparin, but watch H/H closely Foley - none  Plan: No real change she looks the same as she did at discharge last week.  She continues to complain of pain.  She denies any improvement with the IR drain placement.  We will ask ID to follow with [place] also.    LOS: 1 day    Sofija Antwi 01/31/2020 Please see Amion

## 2020-01-31 NOTE — Progress Notes (Signed)
Called PA concerning blood pressure. No orders given at this time.

## 2020-01-31 NOTE — Progress Notes (Signed)
PT Cancellation Note  Patient Details Name: Samantha Gonzales MRN: [id number] DOB: Mar 31, 1941   Cancelled Treatment:     Pt reports pain limiting ability to participate.  RN reporting pt has had low BP and been checking with MD about pain management.  Attempted to see pt at 10:22, 13:34, and 14:31 and each time she declined due to pain, RN is aware.   Ayvion Kavanagh,KATHrine E 01/31/2020, 2:45 PM Thomasene Mohair PT, DPT Acute Rehabilitation Services Office: [phone]

## 2020-01-31 NOTE — Progress Notes (Signed)
Pt refused to take the Megace because she did not know why she was taking it.

## 2020-01-31 NOTE — Progress Notes (Signed)
Lab has reported that patient has critical potassium level of 2.5. MD on call notified per page, verbal order given for 3 runs of 10 mEq IV to be administered then place order to recheck levels. Marcelle Overlie, RN

## 2020-02-01 LAB — CBC
HCT: 26.8 % — ABNORMAL LOW (ref 36.0–46.0)
Hemoglobin: 8.7 g/dL — ABNORMAL LOW (ref 12.0–15.0)
MCH: 32.5 pg (ref 26.0–34.0)
MCHC: 32.5 g/dL (ref 30.0–36.0)
MCV: 100 fL (ref 80.0–100.0)
Platelets: 312 10*3/uL (ref 150–400)
RBC: 2.68 MIL/uL — ABNORMAL LOW (ref 3.87–5.11)
RDW: 15.4 % (ref 11.5–15.5)
WBC: 12.1 10*3/uL — ABNORMAL HIGH (ref 4.0–10.5)
nRBC: 0 % (ref 0.0–0.2)

## 2020-02-01 LAB — MAGNESIUM: Magnesium: 1.9 mg/dL (ref 1.7–2.4)

## 2020-02-01 LAB — BASIC METABOLIC PANEL
Anion gap: 7 (ref 5–15)
BUN: 8 mg/dL (ref 8–23)
CO2: 23 mmol/L (ref 22–32)
Calcium: 8.4 mg/dL — ABNORMAL LOW (ref 8.9–10.3)
Chloride: 110 mmol/L (ref 98–111)
Creatinine, Ser: 0.6 mg/dL (ref 0.44–1.00)
GFR calc Af Amer: >60 mL/min (ref >60)
GFR calc non Af Amer: >60 mL/min (ref >60)
Glucose, Bld: 97 mg/dL (ref 70–99)
Potassium: 3.4 mmol/L — ABNORMAL LOW (ref 3.5–5.1)
Sodium: 140 mmol/L (ref 135–145)

## 2020-02-01 MED ORDER — SODIUM CHLORIDE 0.9 % IV SOLN
1.0000 g | Freq: Two times a day (BID) | INTRAVENOUS | Status: DC
  Administered 2020-02-01 – 2020-02-02 (×3): 1 g via INTRAVENOUS
  Filled 2020-02-01 (×3): qty 1

## 2020-02-01 MED ORDER — POTASSIUM CHLORIDE CRYS ER 20 MEQ PO TBCR
40.0000 meq | EXTENDED_RELEASE_TABLET | Freq: Two times a day (BID) | ORAL | Status: AC
  Administered 2020-02-01 (×2): 40 meq via ORAL
  Filled 2020-02-01 (×2): qty 2

## 2020-02-01 MED ORDER — ACETAMINOPHEN 500 MG PO TABS
1000.0000 mg | ORAL_TABLET | Freq: Four times a day (QID) | ORAL | Status: DC
  Administered 2020-02-01 – 2020-02-03 (×9): 1000 mg via ORAL
  Filled 2020-02-01 (×9): qty 2

## 2020-02-01 NOTE — Progress Notes (Addendum)
Patient ID: Samantha Gonzales, female   DOB: [birth year], 79 y.o.   MRN: [id number]       Subjective: C/o pain in her buttock where drain was placed.  Not eating much because she doesn't think the food is very good here.  Not mobilizing much.  Appears very comfortable.  Had a BM yesterday.  ROS: See above, otherwise other systems negative  Objective: Vital signs in last 24 hours: Temp:  [98.2 F (36.8 C)-98.5 F (36.9 C)] 98.3 F (36.8 C) (04/07 1000) Pulse Rate:  [73-91] 90 (04/07 1000) Resp:  [16-18] 16 (04/07 1000) BP: (101-127)/(42-67) 121/61 (04/07 1000) SpO2:  [100 %] 100 % (04/07 1000) Last BM Date: 01/31/20  Intake/Output from previous day: 04/06 0701 - 04/07 0700 In: 2957 [P.O.:477; I.V.:2180; IV Piggyback:300] Out: 191 [Urine:151; Drains:40] Intake/Output this shift: No intake/output data recorded.  PE: Heart: regular Lungs: CTAB Abd: soft, minimally tender, midline wound is clean and packed with good granulation tissue, right TG drain in place with old bloody output  Lab Results:  Recent Labs    01/31/20 1358 02/01/20 0506  WBC 14.1* 12.1*  HGB 9.3* 8.7*  HCT 27.8* 26.8*  PLT 333 312   BMET Recent Labs    01/31/20 0426 01/31/20 0426 01/31/20 1358 02/01/20 0506  NA 139  --   --  140  K 2.5*   < > 3.3* 3.4*  CL 110  --   --  110  CO2 21*  --   --  23  GLUCOSE 113*  --   --  97  BUN 7*  --   --  8  CREATININE 0.49  --   --  0.60  CALCIUM 8.0*  --   --  8.4*   < > = values in this interval not displayed.   PT/INR Recent Labs    01/30/20 0505  LABPROT 13.1  INR 1.0   CMP     Component Value Date/Time   NA 140 02/01/2020 0506   K 3.4 (L) 02/01/2020 0506   CL 110 02/01/2020 0506   CO2 23 02/01/2020 0506   GLUCOSE 97 02/01/2020 0506   BUN 8 02/01/2020 0506   CREATININE 0.60 02/01/2020 0506   CALCIUM 8.4 (L) 02/01/2020 0506   PROT 6.1 (L) 01/29/2020 2340   ALBUMIN 2.5 (L) 01/29/2020 2340   AST 12 (L) 01/29/2020 2340   ALT 8 01/29/2020  2340   ALKPHOS 68 01/29/2020 2340   BILITOT 0.7 01/29/2020 2340   GFRNONAA >60 02/01/2020 0506   GFRAA >60 02/01/2020 0506   Lipase     Component Value Date/Time   LIPASE 35 01/29/2020 2340       Studies/Results: No results found.  Anti-infectives: Anti-infectives (From admission, onward)   Start     Dose/Rate Route Frequency Ordered Stop   01/31/20 1500  cefTRIAXone (ROCEPHIN) 2 g in sodium chloride 0.9 % 100 mL IVPB     2 g 200 mL/hr over 30 Minutes Intravenous Every 24 hours 01/31/20 1426     01/31/20 1500  metroNIDAZOLE (FLAGYL) IVPB 500 mg     500 mg 100 mL/hr over 60 Minutes Intravenous Every 8 hours 01/31/20 1426         Assessment/Plan Chronic abdominal pain- hydrocodoneuse since 01/25/18 Hypertension - home med LVH Mitral Murmur Aortic sclerosis/bruit Hyperlipidemia Severe protein calorie malnutrition - check prealbumin Chronic anemia - Hgb 11.3 (up since discharge 1 week ago)  S/P ex-lap with small bowel resection and LOA 01/12/20 Dr.  Cornett --Readmit 01/29/2020-- Pelvic fluid collection 9.5cm, suspect hematoma - drain placed by IR on 4/5 with old dark bloody output c/w hematoma.   Cultures shows some gram negative rods currently, e coli and enterobacter -cont abx therapy, but transition to merrem today per pharmacy -dc IV pain meds today.  Schedule tylenol, toradol, prn robaxin, oxy ordered as well.  This should be adequate given how far out from surgery she is and she only has a drain in place.  ID - rocephin/Flagyl 4/6 -->4/7, Merrem 4/7 --> FEN - regular diet, megace, food from home, ensure VTE - SCDs, heparin Foley - none   LOS: 2 days    Henreitta Cea , [facility] 02/01/2020, 11:04 AM Please see Amion for pager number during day hours 7:00am-4:30pm or 7:00am -11:30am on weekends

## 2020-02-01 NOTE — Progress Notes (Signed)
Pharmacy Antibiotic Note  Samantha Gonzales is a 79 y.o. female admitted on 01/29/2020 with abdominal pain.  Pharmacy has been consulted for meropenem dosing.  Plan: Meropenem 1 gr IV q12h   Monitor clinical course, renal function, cultures as available   Height: 5\' 8"  (172.7 cm) Weight: 52.2 kg (115 lb) IBW/kg (Calculated) : 63.9  Temp (24hrs), Avg:98.3 F (36.8 C), Min:98.2 F (36.8 C), Max:98.5 F (36.9 C)  Recent Labs  Lab 01/29/20 2340 01/30/20 0505 01/31/20 0426 01/31/20 1358 02/01/20 0506  WBC 12.6*  --  15.1* 14.1* 12.1*  CREATININE 0.62  --  0.49  --  0.60  LATICACIDVEN 1.1 2.1*  --   --   --     Estimated Creatinine Clearance: 49.3 mL/min (by C-G formula based on SCr of 0.6 mg/dL).    Allergies  Allergen Reactions  . Penicillins Hives, Itching and Rash    Has patient had a PCN reaction causing immediate rash, facial/tongue/throat swelling, SOB or lightheadedness with hypotension: Yes Has patient had a PCN reaction causing severe rash involving mucus membranes or skin necrosis: yes Has patient had a PCN reaction that required hospitalization: unk Has patient had a PCN reaction occurring within the last 10 years: unk If all of the above answers are "NO", then may proceed with Cephalosporin use.     Antimicrobials this admission: 4/6 CTX 2gm q24 >> 4/7 4/6 Flagyl 500mg  IV q8 >>4/7 4/7 meropenem >>   Dose adjustments this admission:    Microbiology results: 4/6 Right  Transgluteal pelvic drain catheter abscess:  mod E.coli (R to amp and Cipro, I to Unasyn, ), moderate e. Faecalis   Thank you for allowing pharmacy to be a part of this patient's care.   6/7, PharmD, BCPS 02/01/2020 11:25 AM

## 2020-02-01 NOTE — Evaluation (Signed)
Occupational Therapy Evaluation Patient Details Name: Samantha Gonzales MRN: [id number] DOB: [birth year] Today's Date: 02/01/2020    History of Present Illness Pt is a 79 y/o female who recently underwent ex lap, bowel resection, lysis of adhesions due to SBO on 01/12/20, presenting with abdominal pain, nausea and vomiting.  CT reveals 9.5 cm intra-abdominal mass that was significant for hematoma versus abscess versus leak as this was alongside the recent anastomosis. S/p drainage of loculated pelvic cul de sac fluid collection 4/5. PMH significant for HTN, syncope, SBO.    Clinical Impression   PTA patient reports independent with ADLs, mobility. Admitted for above and limited by problem list below, including impaired balance, decreased activity tolerance, pain in abdomen and buttocks, and generalized weakness. Pt reoriented to time (reports October 2021), follows commands with increased time, decreased awareness to safety noted.  She completes transfers with min assist, ADLs with setup to max assist. Pt reports having support at home "most of the time" and per chart review from previous admissions her daughter reports 24/7 assist. She will benefit from further OT services while admitted and after dc at [facility] level to optimize return to independence with ADLs, mobility.  Will follow acutely.     Follow Up Recommendations  Home health OT;Supervision/Assistance - 24 hour    Equipment Recommendations  None recommended by OT    Recommendations for Other Services PT consult     Precautions / Restrictions Precautions Precautions: Fall Precaution Comments: drain  Restrictions Weight Bearing Restrictions: No      Mobility Bed Mobility Overal bed mobility: Needs Assistance Bed Mobility: Rolling;Sidelying to Sit;Sit to Sidelying Rolling: Supervision Sidelying to sit: Min guard;HOB elevated     Sit to sidelying: Min guard General bed mobility comments: min guard for line mgmt and safety,  returned to supine with cueing for techniques and min guard for safety    Transfers Overall transfer level: Needs assistance Equipment used: Rolling walker (2 wheeled);None Transfers: Sit to/from Stand Sit to Stand: Min assist         General transfer comment: min assist to steady, cueing for hand placement and safety     Balance Overall balance assessment: Needs assistance Sitting-balance support: No upper extremity supported;Feet supported Sitting balance-Leahy Scale: Fair     Standing balance support: No upper extremity supported;Bilateral upper extremity supported;During functional activity Standing balance-Leahy Scale: Poor Standing balance comment: relaint on BUE support dynamically, but able to groom statically given min guard                            ADL either performed or assessed with clinical judgement   ADL Overall ADL's : Needs assistance/impaired     Grooming: Min guard;Standing;Wash/dry hands   Upper Body Bathing: Set up;Sitting   Lower Body Bathing: Maximal assistance;Sit to/from stand   Upper Body Dressing : Set up;Sitting   Lower Body Dressing: Maximal assistance;Sit to/from stand   Toilet Transfer: Minimal assistance;Ambulation;RW Toilet Transfer Details (indicate cue type and reason): simulated in room, poor safety awareness          Functional mobility during ADLs: Minimal assistance;Rolling walker General ADL Comments: pt limited by pain, impaired balance and generalized weakness      Vision Baseline Vision/History: No visual deficits       Perception     Praxis      Pertinent Vitals/Pain Pain Assessment: 0-10 Pain Score: 8  Pain Location: butt and stomach  Pain Descriptors /  Indicators: Discomfort;Tender Pain Intervention(s): Limited activity within patient's tolerance;Monitored during session;Repositioned     Hand Dominance Right   Extremity/Trunk Assessment Upper Extremity Assessment Upper Extremity  Assessment: Generalized weakness   Lower Extremity Assessment Lower Extremity Assessment: Defer to PT evaluation       Communication Communication Communication: No difficulties   Cognition Arousal/Alertness: Awake/alert Behavior During Therapy: Flat affect Overall Cognitive Status: No family/caregiver present to determine baseline cognitive functioning Area of Impairment: Orientation;Attention;Memory;Following commands;Safety/judgement;Awareness;Problem solving                 Orientation Level: Disoriented to;Time(reports october ) Current Attention Level: Sustained Memory: Decreased short-term memory Following Commands: Follows one step commands consistently;Follows one step commands with increased time;Follows multi-step commands inconsistently Safety/Judgement: Decreased awareness of deficits;Decreased awareness of safety Awareness: Emergent Problem Solving: Requires verbal cues General Comments: pt reports october 2021, but aware of situation; she follows simple commands with increased time, requires cueing for problem solving and safety awareness    General Comments       Exercises     Shoulder Instructions      Home Living Family/patient expects to be discharged to:: Private residence Living Arrangements: Children Available Help at Discharge: Family;Available 24 hours/day("most of the time" ) Type of Home: House Home Access: Stairs to enter [employer] of Steps: 2 Entrance Stairs-Rails: Can reach both Home Layout: One level     Bathroom Shower/Tub: Chief Strategy Officer: Standard     Home Equipment: Environmental consultant - 2 wheels;Bedside commode;Shower seat   Additional Comments: per chart review of last admission, daughter reports pt has 24/7 assist at home       Prior Functioning/Environment Level of Independence: Independent        Comments: not using RW PTA, daugther assists if taking shower but otherwise independnet (reports "wall  walking"         OT Problem List: Decreased strength;Decreased activity tolerance;Impaired balance (sitting and/or standing);Pain;Decreased knowledge of precautions;Decreased knowledge of use of DME or AE;Decreased safety awareness;Decreased cognition      OT Treatment/Interventions: Self-care/ADL training;Energy conservation;DME and/or AE instruction;Therapeutic activities;Cognitive remediation/compensation;Patient/family education;Balance training    OT Goals(Current goals can be found in the care plan section) Acute Rehab OT Goals Patient Stated Goal: feel better OT Goal Formulation: With patient Time For Goal Achievement: 02/15/20 Potential to Achieve Goals: Fair  OT Frequency: Min 2X/week   Barriers to D/C:            Co-evaluation              AM-PAC OT "6 Clicks" Daily Activity     Outcome Measure Help from another person eating meals?: A Little Help from another person taking care of personal grooming?: A Little Help from another person toileting, which includes using toliet, bedpan, or urinal?: A Lot Help from another person bathing (including washing, rinsing, drying)?: A Lot Help from another person to put on and taking off regular upper body clothing?: A Little Help from another person to put on and taking off regular lower body clothing?: A Lot 6 Click Score: 15   End of Session Equipment Utilized During Treatment: Rolling walker;Gait belt Nurse Communication: Mobility status  Activity Tolerance: Patient limited by pain Patient left: in bed;with call bell/phone within reach;with bed alarm set  OT Visit Diagnosis: Unsteadiness on feet (R26.81);Muscle weakness (generalized) (M62.81);Pain Pain - part of body: (abdomen, buttocks)                Time: 1191-4782 OT  Time Calculation (min): 18 min Charges:  OT General Charges $OT Visit: 1 Visit OT Evaluation $OT Eval Moderate Complexity: 1 Mod  Jolaine Artist, OT Acute Rehabilitation Services Pager  [phone] Office [phone]   Delight Stare 02/01/2020, 11:53 AM

## 2020-02-01 NOTE — Progress Notes (Signed)
StageII to L upper buttock cleansed with soap/water, patted dry, and goo applied with protective drsg per order. Had c/o pain to site of ulcer 0-10 rated 8. PRN pain meds administered with minimal effect.

## 2020-02-01 NOTE — Evaluation (Signed)
Physical Therapy Evaluation Patient Details Name: Samantha Gonzales MRN: [id number] DOB: 14-Dec-1940 Today's Date: 02/01/2020   History of Present Illness  Pt is a 79 y/o female who recently underwent ex lap, bowel resection, lysis of adhesions due to SBO on 01/12/20, presenting with abdominal pain, nausea and vomiting.  CT reveals 9.5 cm intra-abdominal mass that was significant for hematoma versus abscess versus leak as this was alongside the recent anastomosis. S/p drainage of loculated pelvic cul de sac fluid collection 4/5. PMH significant for HTN, syncope, SBO.   Clinical Impression  Pt admitted with above diagnosis.  Pt currently with functional limitations due to the deficits listed below (see PT Problem List). Pt will benefit from skilled PT to increase their independence and safety with mobility to allow discharge to the venue listed below.  Pt with poor memory and appears self limiting in regards to mobilizing.  Pt did ambulate short distance in hallway.  Pt anticipates d/c home.     Follow Up Recommendations Supervision/Assistance - 24 hour;No PT follow up    Equipment Recommendations  None recommended by PT    Recommendations for Other Services       Precautions / Restrictions Precautions Precautions: Fall Precaution Comments: R posterior drain      Mobility  Bed Mobility Overal bed mobility: Needs Assistance Bed Mobility: Supine to Sit;Sit to Supine     Supine to sit: Supervision Sit to supine: Supervision   General bed mobility comments: supervision for safey and cues (IV line, drain)  Transfers Overall transfer level: Needs assistance Equipment used: Rolling walker (2 wheeled) Transfers: Sit to/from Stand Sit to Stand: Min guard         General transfer comment: min/guard for safety, cues for hand placement  Ambulation/Gait Ambulation/Gait assistance: Min guard Gait Distance (Feet): 80 Feet Assistive device: Rolling walker (2 wheeled) Gait  Pattern/deviations: Decreased stride length;Step-through pattern     General Gait Details: min/guard for safety, distance per pt preference  Stairs            Wheelchair Mobility    Modified Rankin (Stroke Patients Only)       Balance                                             Pertinent Vitals/Pain Pain Assessment: 0-10 Pain Score: 8  Pain Location: buttocks and stomach Pain Descriptors / Indicators: Discomfort;Tender Pain Intervention(s): Monitored during session;Repositioned;Premedicated before session    Home Living Family/patient expects to be discharged to:: Private residence Living Arrangements: Children Available Help at Discharge: Family;Available 24 hours/day   Home Access: Stairs to enter Entrance Stairs-Rails: Right Entrance Stairs-Number of Steps: 3 Home Layout: One level Home Equipment: Walker - 2 wheels Additional Comments: Daughter states that she has RW, but that prior to this hospitalization, she was not using any AD. Daughter works out of home.    Prior Function Level of Independence: Independent         Comments: Daughter indicates that she has 24/7 supervision from family members- indicates that the cognitive changes have occured over the last year.  (all information from recent previous hospitalization)     Hand Dominance   Dominant Hand: Right    Extremity/Trunk Assessment        Lower Extremity Assessment Lower Extremity Assessment: Generalized weakness    Cervical / Trunk Assessment Cervical / Trunk Assessment: Normal  Communication   Communication: No difficulties  Cognition Arousal/Alertness: Awake/alert Behavior During Therapy: Flat affect Overall Cognitive Status: No family/caregiver present to determine baseline cognitive functioning Area of Impairment: Orientation;Attention;Memory;Following commands;Safety/judgement;Awareness;Problem solving                 Orientation Level: Disoriented  to;Time(reports october ) Current Attention Level: Sustained Memory: Decreased short-term memory Following Commands: Follows one step commands consistently;Follows one step commands with increased time;Follows multi-step commands inconsistently Safety/Judgement: Decreased awareness of deficits;Decreased awareness of safety Awareness: Emergent Problem Solving: Requires verbal cues General Comments: pt follows simple commands with increased time, requires cueing for problem solving and safety awareness; poor short term memory      General Comments      Exercises     Assessment/Plan    PT Assessment Patient needs continued PT services  PT Problem List Decreased strength;Decreased mobility;Decreased activity tolerance;Decreased balance;Decreased cognition;Decreased safety awareness       PT Treatment Interventions Therapeutic activities;Gait training;Therapeutic exercise;Balance training;Stair training;Patient/family education;Functional mobility training;DME instruction    PT Goals (Current goals can be found in the Care Plan section)  Acute Rehab PT Goals Patient Stated Goal: go home PT Goal Formulation: With patient Time For Goal Achievement: 02/08/20 Potential to Achieve Goals: Fair    Frequency Min 2X/week   Barriers to discharge        Co-evaluation               AM-PAC PT "6 Clicks" Mobility  Outcome Measure Help needed turning from your back to your side while in a flat bed without using bedrails?: A Little Help needed moving from lying on your back to sitting on the side of a flat bed without using bedrails?: A Little Help needed moving to and from a bed to a chair (including a wheelchair)?: A Little Help needed standing up from a chair using your arms (e.g., wheelchair or bedside chair)?: A Little Help needed to walk in hospital room?: A Little Help needed climbing 3-5 steps with a railing? : A Little 6 Click Score: 18    End of Session   Activity  Tolerance: Patient tolerated treatment well Patient left: in bed;with call bell/phone within reach;with bed alarm set   PT Visit Diagnosis: Other abnormalities of gait and mobility (R26.89)    Time: 9371-6967 PT Time Calculation (min) (ACUTE ONLY): 11 min   Charges:   PT Evaluation $PT Eval Low Complexity: 1 Low        Kati PT, DPT Acute Rehabilitation Services Office: [phone]  Trena Platt 02/01/2020, 2:51 PM

## 2020-02-01 NOTE — Progress Notes (Signed)
Referring Physician(s): Ramirez,A  Supervising Physician: Ruel Favors  Patient Status:  [facility] - In-pt  Chief Complaint: Pelvic pain/fluid collection   Subjective: Patient without new complaints.  Still having some pain in buttocks region.   Allergies: Penicillins  Medications: Prior to Admission medications   Medication Sig Start Date End Date Taking? Authorizing Provider  amLODipine (NORVASC) 10 MG tablet Take 10 mg by mouth daily. 09/25/19  Yes [provider]  docusate sodium (COLACE) 100 MG capsule Take 1 capsule (100 mg total) by mouth 2 (two) times daily as needed for mild constipation. 01/19/20  Yes Rayburn, Alphonsus Sias, PA-C  HYDROcodone-acetaminophen (NORCO) 10-325 MG tablet Take 1 tablet by mouth every 6 (six) hours as needed for severe pain. 01/23/20  Yes Sherrie George, PA-C  Multiple Vitamin (MULTIVITAMIN WITH MINERALS) TABS tablet Take 1 tablet by mouth daily. 01/19/20  Yes Rayburn, Tresa Endo A, PA-C  rosuvastatin (CRESTOR) 20 MG tablet Take 20 mg by mouth daily. 07/30/18  Yes [provider]  ferrous sulfate 325 (65 FE) MG tablet Take 1 tablet (325 mg total) by mouth daily with breakfast. 01/20/20   Rayburn, Alphonsus Sias, PA-C  folic acid (FOLVITE) 1 MG tablet Take 1 tablet (1 mg total) by mouth daily. 01/20/20   Noralee Stain, DO  gabapentin (NEURONTIN) 100 MG capsule Take 2 capsules (200 mg total) by mouth 3 (three) times daily. 01/23/20   Sherrie George, PA-C  ibuprofen (ADVIL) 200 MG tablet You can take 2 tablets every 6 hours as needed for pain.  Follow package directions.  You can buy this over-the-counter at any drugstore. 01/23/20   Sherrie George, PA-C  metroNIDAZOLE (FLAGYL) 500 MG tablet Take 1 tablet (500 mg total) by mouth every 8 (eight) hours. 01/23/20   Sherrie George, PA-C     Vital Signs: BP 121/61 (BP Location: Left Arm)   Pulse 90   Temp 98.3 F (36.8 C) (Oral)   Resp 16   Ht 5\' 8"  (1.727 m)   Wt 115 lb (52.2 kg)   SpO2 100%    BMI 17.49 kg/m   Physical Exam awake, alert.  Right transgluteal drain intact, some tenderness to palpation, output 40 cc bloody fluid.  Drain irrigated without difficulty.  Imaging: CT ABDOMEN PELVIS W CONTRAST  Result Date: 01/30/2020 CLINICAL DATA:  Nausea and vomiting. EXAM: CT ABDOMEN AND PELVIS WITH CONTRAST TECHNIQUE: Multidetector CT imaging of the abdomen and pelvis was performed using the standard protocol following bolus administration of intravenous contrast. CONTRAST:  03/31/2020 OMNIPAQUE IOHEXOL 300 MG/ML  SOLN COMPARISON:  January 11, 2020 FINDINGS: Lower chest: The lung bases are clear. The heart size is normal. Hepatobiliary: Hepatic cysts are noted. Status post cholecystectomy.There is no biliary ductal dilation. Pancreas: Normal contours without ductal dilatation. No peripancreatic fluid collection. Spleen: Unremarkable. Adrenals/Urinary Tract: --Adrenal glands: Unremarkable. --Right kidney/ureter: No hydronephrosis or radiopaque kidney stones. --Left kidney/ureter: No hydronephrosis or radiopaque kidney stones. --Urinary bladder: Unremarkable. Stomach/Bowel: --Stomach/Duodenum: No hiatal hernia or other gastric abnormality. Normal duodenal course and caliber. --Small bowel: The previously demonstrated small-bowel obstruction appears to resolved. These there is a surgical anastomosis in the low midline abdomen. --Colon: There is some questionable wall thickening of the sigmoid colon. There may be some wall thickening at the hepatic flexure. There is an enhancing mass in the right lower quadrant measuring approximately 1.7 cm (axial series 2, image 47). This appears to be present on recent prior studies but is difficult to appreciate on studies dating back to 2020. --Appendix:  Normal. Vascular/Lymphatic: Atherosclerotic calcification is present within the non-aneurysmal abdominal aorta, without hemodynamically significant stenosis. 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  There are enlarged pelvic veins. --No retroperitoneal lymphadenopathy. --No mesenteric lymphadenopathy. --No pelvic or inguinal lymphadenopathy. Reproductive: Unremarkable Other: The patient's pelvis there has been interval development of a complex collection measuring approximately 9.5 x 8.6 by 7.1 cm. This collection contains hyperdense components in the nondependent portion. This mass abuts the surgical anastomosis. There is a midline incision. Body wall edema is noted. Musculoskeletal. No acute displaced fractures. IMPRESSION: 1. New pelvic mass measuring approximately 9.5 cm abutting the new small bowel surgical anastomosis. This is favored to represent a postoperative hematoma. A developing abscess or contained perforation seems less likely given the lack air within this collection. 2. No evidence for small bowel obstruction. 3. Scattered areas of mild colonic wall thickening is favored to be secondary to underdistention, however mild colitis is not excluded. 4. Enhancing 1.7 cm mass in the right lower quadrant may represent an enlarged lymph node. A nonemergent follow-up examination with IV and oral contrast would be useful for further characterization. 5. Prominent pelvic veins which are nonspecific but can be seen in patients with pelvic congestion syndrome. Aortic Atherosclerosis (ICD10-I70.0). Electronically Signed   By: Constance Holster M.D.   On: 01/30/2020 02:00   CT IMAGE GUIDED DRAINAGE BY PERCUTANEOUS CATHETER  Result Date: 01/30/2020 CLINICAL DATA:  Recent lysis of adhesions for small-bowel obstruction. New loculated pelvic collection. Abdominal pain. EXAM: CT GUIDED DRAINAGE OF PELVIC ABSCESS ANESTHESIA/SEDATION: Intravenous Fentanyl 139mcg and Versed 2mg  were administered as conscious sedation during continuous monitoring of the patient's level of consciousness and physiological / cardiorespiratory status by the radiology RN, with a total moderate sedation time of 17 minutes. PROCEDURE: The  procedure, risks, benefits, and alternatives were explained to the patient. Questions regarding the procedure were encouraged and answered. The patient understands and consents to the procedure. Patient placed prone. Select axial scans through the pelvis obtained. An appropriate skin entry site was determined and marked. The operative field was prepped with chlorhexidinein a sterile fashion, and a sterile drape was applied covering the operative field. A sterile gown and sterile gloves were used for the procedure. Local anesthesia was provided with 1% Lidocaine. Under CT fluoroscopic guidance, 18 gauge percutaneous entry needle advanced into the collection. Bloody purulent material could be aspirated. Amplatz guidewire advanced easily, position confirmed on CT. Tract dilated to facilitate placement 10 French pigtail drain catheter, formed centrally within the collection. 30 mL of purulent bloody material were aspirated, sent for Gram stain and culture. Catheter secured externally with 0 Prolene suture and StatLock and placed to gravity drain bag. The patient tolerated the procedure well. COMPLICATIONS: None immediate FINDINGS: Loculated cul-de-sac fluid collection was localized. 10 French pigtail drain catheter placed as above. 30 mL of bloody purulent material aspirated, sent for Gram stain and culture. IMPRESSION: 1. Technically successful CT guided pelvic abscess drain catheter placement Electronically Signed   By: Lucrezia Europe M.D.   On: 01/30/2020 13:54    Labs:  CBC: Recent Labs    01/29/20 2340 01/31/20 0426 01/31/20 1358 02/01/20 0506  WBC 12.6* 15.1* 14.1* 12.1*  HGB 11.3* 8.6* 9.3* 8.7*  HCT 34.1* 26.4* 27.8* 26.8*  PLT 518* 334 333 312    COAGS: Recent Labs    09/11/19 1006 01/12/20 0626 01/30/20 0505  INR 1.2 1.0 1.0    BMP: Recent Labs    01/22/20 0447 01/22/20 0447  01/29/20 2340 01/31/20 0426 01/31/20 1358 02/01/20 0506  NA 137  --  141 139  --  140  K 3.6   < > 2.6*  2.5* 3.3* 3.4*  CL 109  --  107 110  --  110  CO2 20*  --  25 21*  --  23  GLUCOSE 84  --  106* 113*  --  97  BUN 10  --  5* 7*  --  8  CALCIUM 8.2*  --  8.9 8.0*  --  8.4*  CREATININE 0.62  --  0.62 0.49  --  0.60  GFRNONAA >60  --  >60 >60  --  >60  GFRAA >60  --  >60 >60  --  >60   < > = values in this interval not displayed.    LIVER FUNCTION TESTS: Recent Labs    01/14/20 0341 01/15/20 0333 01/16/20 0347 01/29/20 2340  BILITOT 0.5 0.4 0.3 0.7  AST 14* 13* 11* 12*  ALT 10 8 7 8   ALKPHOS 49 45 39 68  PROT 4.7* 4.4* 4.3* 6.1*  ALBUMIN 2.2* 1.8* 1.8* 2.5*    Assessment and Plan: Pt s/pdrainage of post cholecystectomy gallbladder fossa and pelvic fluid collections on 09/11/2019. Drains weresubsequently removed by surgical team. She has a history of recurrent small bowel obstruction and is status post exploratory lap with small bowel resection and lysis of adhesions due to high-grade SBO on 01/12/2020; s/p drainage of loculated pelvic cul de sac fluid collection 4/5; afebrile, WBC 12.1 down from 14.1, hemoglobin 8.7 down from 9.3, potassium 3.4, creatinine normal, drain fluid cultures growing moderate E. coli and Enterococcus, susceptibilities pending; continue to monitor labs, drain output, continue drain irrigation; once output less than 10 cc/day for 2-3 consecutive days obtain follow-up CT.  Mobilize; other plans per surgery.    Electronically Signed: D. 01/14/2020, PA-C 02/01/2020, 11:34 AM   I spent a total of 15 minutes at the the patient's bedside AND on the patient's hospital floor or unit, greater than 50% of which was counseling/coordinating care for pelvic fluid collection/abscess drainage    Patient ID: 04/02/2020, female   DOB: 11-17-1940, 79 y.o.   MRN: [id number]

## 2020-02-02 LAB — BASIC METABOLIC PANEL
Anion gap: 8 (ref 5–15)
BUN: 5 mg/dL — ABNORMAL LOW (ref 8–23)
CO2: 23 mmol/L (ref 22–32)
Calcium: 8.5 mg/dL — ABNORMAL LOW (ref 8.9–10.3)
Chloride: 111 mmol/L (ref 98–111)
Creatinine, Ser: 0.5 mg/dL (ref 0.44–1.00)
GFR calc Af Amer: >60 mL/min (ref >60)
GFR calc non Af Amer: >60 mL/min (ref >60)
Glucose, Bld: 86 mg/dL (ref 70–99)
Potassium: 3.7 mmol/L (ref 3.5–5.1)
Sodium: 142 mmol/L (ref 135–145)

## 2020-02-02 LAB — CBC
HCT: 27.6 % — ABNORMAL LOW (ref 36.0–46.0)
Hemoglobin: 9 g/dL — ABNORMAL LOW (ref 12.0–15.0)
MCH: 32.7 pg (ref 26.0–34.0)
MCHC: 32.6 g/dL (ref 30.0–36.0)
MCV: 100.4 fL — ABNORMAL HIGH (ref 80.0–100.0)
Platelets: 339 10*3/uL (ref 150–400)
RBC: 2.75 MIL/uL — ABNORMAL LOW (ref 3.87–5.11)
RDW: 15.1 % (ref 11.5–15.5)
WBC: 9.9 10*3/uL (ref 4.0–10.5)
nRBC: 0 % (ref 0.0–0.2)

## 2020-02-02 LAB — AEROBIC/ANAEROBIC CULTURE W GRAM STAIN (SURGICAL/DEEP WOUND)

## 2020-02-02 MED ORDER — SULFAMETHOXAZOLE-TRIMETHOPRIM 800-160 MG PO TABS
1.0000 | ORAL_TABLET | Freq: Two times a day (BID) | ORAL | Status: DC
  Administered 2020-02-02 – 2020-02-03 (×3): 1 via ORAL
  Filled 2020-02-02 (×4): qty 1

## 2020-02-02 MED ORDER — DOCUSATE SODIUM 100 MG PO CAPS
100.0000 mg | ORAL_CAPSULE | Freq: Two times a day (BID) | ORAL | Status: DC
  Administered 2020-02-02 – 2020-02-03 (×2): 100 mg via ORAL
  Filled 2020-02-02 (×2): qty 1

## 2020-02-02 MED ORDER — LINEZOLID 600 MG PO TABS
600.0000 mg | ORAL_TABLET | Freq: Two times a day (BID) | ORAL | Status: DC
  Administered 2020-02-02 – 2020-02-03 (×3): 600 mg via ORAL
  Filled 2020-02-02 (×4): qty 1

## 2020-02-02 MED ORDER — ACETAMINOPHEN 500 MG PO TABS: 1000.0000 mg | ORAL_TABLET | Freq: Four times a day (QID) | ORAL | 0 refills | Status: DC

## 2020-02-02 MED ORDER — POLYETHYLENE GLYCOL 3350 17 G PO PACK: 17.0000 g | PACK | Freq: Every day | ORAL | Status: DC | PRN

## 2020-02-02 MED ORDER — SULFAMETHOXAZOLE-TRIMETHOPRIM 800-160 MG PO TABS: 1.0000 | ORAL_TABLET | Freq: Two times a day (BID) | ORAL | 0 refills | Status: DC

## 2020-02-02 MED ORDER — MEGESTROL ACETATE 40 MG PO TABS: 160.0000 mg | ORAL_TABLET | Freq: Every day | ORAL | 0 refills | Status: DC

## 2020-02-02 MED ORDER — LINEZOLID 600 MG PO TABS: 600.0000 mg | ORAL_TABLET | Freq: Two times a day (BID) | ORAL | 0 refills | Status: AC

## 2020-02-02 MED ORDER — OXYCODONE HCL 10 MG PO TABS: 5.0000 mg | ORAL_TABLET | Freq: Four times a day (QID) | ORAL | 0 refills | Status: AC | PRN

## 2020-02-02 MED ORDER — POLYETHYLENE GLYCOL 3350 17 G PO PACK: 17.0000 g | PACK | Freq: Every day | ORAL | 0 refills | Status: AC | PRN

## 2020-02-02 NOTE — Discharge Instructions (Signed)
Wet to Dry WOUND CARE: - Change dressing twice daily - Supplies: sterile saline, kerlex, scissors, ABD pads, tape  1. Remove dressing and all packing carefully, moistening with sterile saline as needed to avoid packing/internal dressing sticking to the wound. 2.   Clean edges of skin around the wound with water/gauze, making sure there is no tape debris or leakage left on skin that could cause skin irritation or breakdown. 3.   Dampen and clean kerlex with sterile saline and pack wound from wound base to skin level, making sure to take note of any possible areas of wound tracking, tunneling and packing appropriately. Wound can be packed loosely. Trim kerlex to size if a whole kerlex is not required. 4.   Cover wound with a dry ABD pad and secure with tape.  5.   Write the date/time on the dry dressing/tape to better track when the last dressing change occurred. - apply any skin protectant/powder if recommended by clinician to protect skin/skin folds. - change dressing as needed if leakage occurs, wound gets contaminated, or patient requests to shower. - You may shower daily with wound open and following the shower the wound should be dried and a clean dressing placed.  - Medical grade tape as well as packing supplies can be found at [facility] on [address]. The remaining supplies can be found at your local drug store, [employer] etc.    Percutaneous Abscess Drain, Care After This sheet gives you information about how to care for yourself after your procedure. Your health care provider may also give you more specific instructions. If you have problems or questions, contact your health care provider. What can I expect after the procedure? After your procedure, it is common to have:  A small amount of bruising and discomfort in the area where the drainage tube (catheter) was placed.  Sleepiness and fatigue. This should go away after the medicines you were given have worn off. Follow these  instructions at home: Incision care  Follow instructions from your health care provider about how to take care of your incision. Make sure you: ? Wash your hands with soap and water before you change your bandage (dressing). If soap and water are not available, use hand sanitizer. ? Change your dressing as told by your health care provider. ? Leave stitches (sutures), skin glue, or adhesive strips in place. These skin closures may need to stay in place for 2 weeks or longer. If adhesive strip edges start to loosen and curl up, you may trim the loose edges. Do not remove adhesive strips completely unless your health care provider tells you to do that.  Check your incision area every day for signs of infection. Check for: ? More redness, swelling, or pain. ? More fluid or blood. ? Warmth. ? Pus or a bad smell. ? Fluid leaking from around your catheter (instead of fluid draining through your catheter). Catheter care   Follow instructions from your health care provider about emptying and cleaning your catheter and collection bag. You may need to clean the catheter every day so it does not clog.  If directed, write down the following information every time you empty your bag: ? The date and time. ? The amount of drainage. General instructions  Rest at home for 1-2 days after your procedure. Return to your normal activities as told by your health care provider.  Do not take baths, swim, or use a hot tub for 24 hours after your procedure, or until  your health care provider says that this is okay.  Take over-the-counter and prescription medicines only as told by your health care provider.  Keep all follow-up visits as told by your health care provider. This is important. Contact a health care provider if:  You have less than 10 mL of drainage a day for 2-3 days in a row, or as directed by your health care provider.  You have more redness, swelling, or pain around your incision area.  You  have more fluid or blood coming from your incision area.  Your incision area feels warm to the touch.  You have pus or a bad smell coming from your incision area.  You have fluid leaking from around your catheter (instead of through your catheter).  You have a fever or chills.  You have pain that does not get better with medicine. Get help right away if:  Your catheter comes out.  You suddenly stop having drainage from your catheter.  You suddenly have blood in the fluid that is draining from your catheter.  You become dizzy or you faint.  You develop a rash.  You have nausea or vomiting.  You have difficulty breathing or you feel short of breath.  You develop chest pain.  You have problems with your speech or vision.  You have trouble balancing or moving your arms or legs. Summary  It is common to have a small amount of bruising and discomfort in the area where the drainage tube (catheter) was placed.  You may be directed to record the amount of drainage from the bag every time you empty it.  Follow instructions from your health care provider about emptying and cleaning your catheter and collection bag. This information is not intended to replace advice given to you by your health care provider. Make sure you discuss any questions you have with your health care provider. Document Revised: 09/25/2017 Document Reviewed: 09/04/2016 Elsevier Patient Education  2020 [employer].

## 2020-02-02 NOTE — Care Management Important Message (Signed)
Important Message  Patient Details IM Letter given to [name] SW Case Manager to present to the Patient Name: Samantha Gonzales MRN: [id number] Date of Birth: 1941/08/06   Medicare Important Message Given:  Yes     Caren Macadam 02/02/2020, 12:15 PM

## 2020-02-02 NOTE — Progress Notes (Signed)
Pt's is alert, gets forgetful at times. Pt is using BSC frequently due to urinary urgency. Pt is not able to keep up with the sacral dressing, its falling off. Upon assessment the sacral ulcer looks black in color with slough. New dressing applied, educated pt change her position every 2 hours and avoid laying on her back. On call MD, Dr Gerrit Friends is paged and notified. Received wound consult orders. Order is placed.

## 2020-02-02 NOTE — TOC Initial Note (Signed)
Transition of Care [facility]) - Initial/Assessment Note    Patient Details  Name: Samantha Gonzales MRN: [id number] Date of Birth: 1941-02-21  [facility]) CM/SW Contact:    Samantha Coots, LCSW Phone Number: 02/02/2020, 2:13 PM  Clinical Narrative:                 CSW reached out to the patient daughter Samantha Gonzales to discuss home health follow up. Patient is currently active with [facility]-PT/RN. Daughter reports the patient did not have her first visit before she readmitted to the hospital.  Daughter  reports she has been assisting the patient with her dressing change at home and will continue to do so. [facility] will not provide daily dressing changes,they provide teachings to a caregiver). CSW notified Rep. Samantha Gonzales, the patient will discharge home mostly likely tomorrow.   No other needs identified, CSW will sign off.   Expected Discharge Plan: Home w Home Health Services Barriers to Discharge: Barriers Resolved   Patient Goals and CMS Choice Patient states their goals for this hospitalization and ongoing recovery are:: Return home CMS Medicare.gov Compare Post Acute Care list provided to:: Patient Choice offered to / list presented to : Patient, Adult Children  Expected Discharge Plan and Services Expected Discharge Plan: Home w Home Health Services   Discharge Planning Services: CM Consult Post Acute Care Choice: Home Health Living arrangements for the past 2 months: Single Family Home                 DME Arranged: N/A         HH Arranged: RN, OT   Date HH Agency Contacted: 02/02/20 Time HH Agency Contacted: 1216 Representative spoke with at [facility] Agency: Samantha Gonzales  Prior Living Arrangements/Services Living arrangements for the past 2 months: Single Family Home Lives with:: Adult Children Patient language and need for interpreter reviewed:: No Do you feel safe going back to the place where you live?: Yes      Need for Family Participation in Patient Care: Yes (Comment) Care  giver support system in place?: Yes (comment) Current home services: DME Criminal Activity/Legal Involvement Pertinent to Current Situation/Hospitalization: No - Comment as needed  Activities of Daily Living Home Assistive Devices/Equipment: Walker (specify type) ADL Screening (condition at time of admission) Patient's cognitive ability adequate to safely complete daily activities?: Yes Is the patient deaf or have difficulty hearing?: No Does the patient have difficulty seeing, even when wearing glasses/contacts?: No Does the patient have difficulty concentrating, remembering, or making decisions?: Yes Patient able to express need for assistance with ADLs?: Yes Does the patient have difficulty dressing or bathing?: Yes Independently performs ADLs?: Yes (appropriate for developmental age) Does the patient have difficulty walking or climbing stairs?: No Weakness of Legs: None Weakness of Arms/Hands: None  Permission Sought/Granted Permission sought to share information with : Case Manager Permission granted to share information with : Yes, Verbal Permission Granted  Share Information with NAME: Samantha, Gonzales  Permission granted to share info w AGENCY: [facility]  Permission granted to share info w Relationship: Daughter  Permission granted to share info w Contact Information: [phone]  [phone]  Emotional Assessment Appearance:: Appears stated age Attitude/Demeanor/Rapport: Unable to Assess Affect (typically observed): Unable to Assess Orientation: : Oriented to Self, Oriented to Place, Oriented to  Time, Oriented to Situation Alcohol / Substance Use: Not Applicable Psych Involvement: No (comment)  Admission diagnosis:  Hypokalemia [E87.6] Intra-abdominal abscess (HCC) [K65.1] S/P small bowel resection [Z90.49] Abdominal hematoma [S30.1XXA] Patient Active  Problem List   Diagnosis Date Noted  . 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 Protein-calorie malnutrition, severe  01/19/2020  . Murmur, heart 01/11/2020  . SBO (small bowel obstruction) ([place]) 01/11/2020  . Small bowel obstruction ([place]) 12/29/2019  . Pressure injury of skin 10/29/2019  . Colitis 10/28/2019  . AKI (acute kidney injury) ([place]) 10/27/2019  . Syncope 10/27/2019  . Hypokalemia 10/27/2019  . Essential hypertension 10/27/2019  . Malnutrition of moderate degree 09/07/2019  . Cholecystitis 09/04/2019  . Altered mental status 01/18/2018  . Altered mental state 01/17/2018   PCP:  System, Pcp Not In Pharmacy:   [facility] [place], [place] - [address] [place] [address] Phone: [phone] Fax: [phone]     Social Determinants of Health (SDOH) Interventions    Readmission Risk Interventions No flowsheet data found.

## 2020-02-02 NOTE — Plan of Care (Signed)

## 2020-02-02 NOTE — Progress Notes (Signed)
RN called Pt's daughter [name], call was not answered, left VM to call back.

## 2020-02-02 NOTE — Discharge Summary (Signed)
[facility] Discharge Summary   Patient ID: Samantha Gonzales MRN: [id number] DOB/AGE: 06/19/1941 79 y.o.  Admit date: 01/29/2020 Discharge date: 02/03/2020  Admitting Diagnosis: S/P ex-lap with small bowel resection and LOA 01/12/20 Dr. Luisa Hart Pelvic fluid collection 9.5cm  Discharge Diagnosis S/P ex-lap with small bowel resection and LOA 01/12/20 Dr. 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  Procedures Dr. Deanne Coffer (01/30/2020) - CT R transgluteal pelvic abscess drain placement 62F  Hospital Course:  Samantha Gonzales is a 79yo female who recently underwent ex lap, bowel resection, lysis of adhesions due to SBO on 01/12/2020. Discharged home on 01/23/2020 and returned to [facility] 4/5 complaining of worsening abdominal pain. Upon evaluation in the ER she underwent CT scan.  CT scan was significant for 9.5 cm intra-abdominal fluid collection that was significant for hematoma versus abscess versus leak as this was alongside the recent anastomosis. Patient was admitted to the surgical service. Interventional radiology was consulted and placed a percutaneous drain on 01/30/2020. Cultures from this grew E coli and enterococcus. She was initially started on rocephin/flagyl, then based on the cultures she was switched to meropenem, and then to bactrim and zyvox. Patient was started on megace in attempts to improve her appetite. She worked with therapies during this admission. She was noted to have an unstageable pressure injury over her sacrum 4/9; enzymatic debridement with santyl and daily dressing changes were initiated.  On 4/9 the patient was felt stable for discharge home.  Patient will follow up as below with surgery, IR, and her pain clinic, and she knows to call with questions or concerns.    I have personally reviewed the patients medication history on the [place] controlled substance  database.    Physical Exam: Gen:  Alert, NAD Card:  RRR Pulm:  rate and effort normal Abd: Soft, ND, appropriately tender, +BS, open midline incision clean and without drainage, TG drain with old bloody output   Allergies as of 02/03/2020      Reactions   Penicillins Hives, Itching, Rash   Has patient had a PCN reaction causing immediate rash, facial/tongue/throat swelling, SOB or lightheadedness with hypotension: Yes Has patient had a PCN reaction causing severe rash involving mucus membranes or skin necrosis: yes Has patient had a PCN reaction that required hospitalization: unk Has patient had a PCN reaction occurring within the last 10 years: unk If all of the above answers are "NO", then may proceed with Cephalosporin use.      Medication List    STOP taking these medications   HYDROcodone-acetaminophen 10-325 MG tablet Commonly known as: Norco   metroNIDAZOLE 500 MG tablet Commonly known as: FLAGYL     TAKE these medications   acetaminophen 500 MG tablet Commonly known as: TYLENOL Take 2 tablets (1,000 mg total) by mouth every 6 (six) hours.   amLODipine 10 MG tablet Commonly known as: NORVASC Take 10 mg by mouth daily.   collagenase ointment Commonly known as: SANTYL Apply topically daily.   docusate sodium 100 MG capsule Commonly known as: Colace Take 1 capsule (100 mg total) by mouth 2 (two) times daily as needed for mild constipation.   feeding supplement (ENSURE ENLIVE) Liqd Take 237 mLs by mouth 3 (three) times daily between meals.   ferrous sulfate 325 (65 FE) MG tablet Take 1 tablet (325 mg total) by mouth daily with breakfast.   folic acid 1 MG tablet Commonly known as: FOLVITE Take  1 tablet (1 mg total) by mouth daily.   gabapentin 100 MG capsule Commonly known as: NEURONTIN Take 2 capsules (200 mg total) by mouth 3 (three) times daily.   ibuprofen 200 MG tablet Commonly known as: ADVIL You can take 2 tablets every 6 hours as needed for  pain.  Follow package directions.  You can buy this over-the-counter at any drugstore.   linezolid 600 MG tablet Commonly known as: ZYVOX Take 1 tablet (600 mg total) by mouth 2 (two) times daily for 7 days.   megestrol 40 MG tablet Commonly known as: MEGACE Take 4 tablets (160 mg total) by mouth daily.   multivitamin with minerals Tabs tablet Take 1 tablet by mouth daily.   Oxycodone HCl 10 MG Tabs Take 0.5-1 tablets (5-10 mg total) by mouth every 6 (six) hours as needed for severe pain.   polyethylene glycol 17 g packet Commonly known as: MIRALAX / GLYCOLAX Take 17 g by mouth daily as needed for mild constipation.   rosuvastatin 20 MG tablet Commonly known as: CRESTOR Take 20 mg by mouth daily.   sodium chloride flush 0.9 % Soln Commonly known as: NS 5 mLs by Intracatheter route daily for 28 days.   sulfamethoxazole-trimethoprim 800-160 MG tablet Commonly known as: BACTRIM DS Take 1 tablet by mouth every 12 (twelve) hours.        Follow-up Information    Arne Cleveland, MD Follow up.   Specialties: Interventional Radiology, Radiology Contact information: [address] [place] [address] [phone]        [facility]. Go on 02/07/2020.   Specialty: Pain Medicine Why: 02/07/2020 at 8:40am with [place] information: [address] [place] [address] [phone]        Erroll Luna, MD. Go on 02/10/2020.   Specialty: General Surgery Why: Your appointment is 02/10/2020 at 11:40am Please arrive 15 minutes early to check in. Contact information: [place] [address] [phone]           Signed: Wellington Hampshire, [facility] 02/02/2020, 10:14 AM Please see Amion for pager number during day hours 7:00am-4:30pm

## 2020-02-02 NOTE — Progress Notes (Signed)
Antimicrobial Stewardship - Linezolid coverage  Patient with multiple organisms from drain culture, including enterococcus.  Patient described hives to face w/ itching when took PCN ~50 years ago. No known amoxicillin or amoxicllin/clavulonate use.  Decision to use linezolid x 7 days for PO therapy.  After d/w surgery, prescription sent to [facility] on [address].  Cost is $10.  Pharmacy will order and have in stock tomorrow afternoon.  Likely, next dose would be due tomorrow evening if discharged 4/9.  Juliette Alcide, PharmD, BCPS.   Work Cell: [phone] 02/02/2020 11:54 AM

## 2020-02-02 NOTE — Progress Notes (Signed)
Pt refused to ambulate in the floor despite multiple attempts. RN approached multiple times.  Pt noted with poor PO intake, refused lunch despite encouragement. Will attempt again.

## 2020-02-02 NOTE — Progress Notes (Addendum)
[facility] Progress Note     Subjective: CC-  Complaining of abdominal and buttock pain, no better no worse. Denies n/v. BM 2 days ago. Not eating much because she does not like the food. Son is going to bring her some canned peaches.  Objective: Vital signs in last 24 hours: Temp:  [98.2 F (36.8 C)-98.4 F (36.9 C)] 98.4 F (36.9 C) (04/08 0552) Pulse Rate:  [72-90] 75 (04/08 0552) Resp:  [16-18] 16 (04/08 0552) BP: (110-141)/(52-65) 141/65 (04/08 0552) SpO2:  [100 %] 100 % (04/08 0552) Last BM Date: 01/31/20  Intake/Output from previous day: 04/07 0701 - 04/08 0700 In: 2124.4 [P.O.:720; I.V.:1063.1; IV Piggyback:341.3] Out: 40 [Drains:40] Intake/Output this shift: No intake/output data recorded.  PE: Gen:  Alert, NAD Card:  RRR Pulm:  rate and effort normal Abd: Soft, NT/ND, +BS, open midline incision clean and without drainage, TG drain with old bloody output  Lab Results:  Recent Labs    02/01/20 0506 02/02/20 0459  WBC 12.1* 9.9  HGB 8.7* 9.0*  HCT 26.8* 27.6*  PLT 312 339   BMET Recent Labs    02/01/20 0506 02/02/20 0459  NA 140 142  K 3.4* 3.7  CL 110 111  CO2 23 23  GLUCOSE 97 86  BUN 8 5*  CREATININE 0.60 0.50  CALCIUM 8.4* 8.5*   PT/INR No results for input(s): LABPROT, INR in the last 72 hours. CMP     Component Value Date/Time   NA 142 02/02/2020 0459   K 3.7 02/02/2020 0459   CL 111 02/02/2020 0459   CO2 23 02/02/2020 0459   GLUCOSE 86 02/02/2020 0459   BUN 5 (L) 02/02/2020 0459   CREATININE 0.50 02/02/2020 0459   CALCIUM 8.5 (L) 02/02/2020 0459   PROT 6.1 (L) 01/29/2020 2340   ALBUMIN 2.5 (L) 01/29/2020 2340   AST 12 (L) 01/29/2020 2340   ALT 8 01/29/2020 2340   ALKPHOS 68 01/29/2020 2340   BILITOT 0.7 01/29/2020 2340   GFRNONAA >60 02/02/2020 0459   GFRAA >60 02/02/2020 0459   Lipase     Component Value Date/Time   LIPASE 35 01/29/2020 2340       Studies/Results: No results  found.  Anti-infectives: Anti-infectives (From admission, onward)   Start     Dose/Rate Route Frequency Ordered Stop   02/01/20 1200  meropenem (MERREM) 1 g in sodium chloride 0.9 % 100 mL IVPB     1 g 200 mL/hr over 30 Minutes Intravenous Every 12 hours 02/01/20 1114     01/31/20 1500  cefTRIAXone (ROCEPHIN) 2 g in sodium chloride 0.9 % 100 mL IVPB  Status:  Discontinued     2 g 200 mL/hr over 30 Minutes Intravenous Every 24 hours 01/31/20 1426 02/01/20 1112   01/31/20 1500  metroNIDAZOLE (FLAGYL) IVPB 500 mg  Status:  Discontinued     500 mg 100 mL/hr over 60 Minutes Intravenous Every 8 hours 01/31/20 1426 02/01/20 1112       Assessment/Plan Chronic abdominal pain- hydrocodoneuse since 01/25/18 Hypertension- home med LVH Mitral Murmur Aortic sclerosis/bruit Hyperlipidemia Severe protein calorie malnutrition - prealbumin 8.3 (4/6) Chronic anemia   S/P ex-lap with small bowel resection and LOA 01/12/20 Dr. Brantley Stage --Readmit 01/29/2020-- Pelvic fluid collection 9.5cm, suspect hematoma - s/p IR drain on 4/5, cx grew E coli, switched to meropenem 4/7 - abx discussed with ID Dr. Tommy Medal  ID -rocephin/Flagyl 4/6 -->4/7, Merrem 4/7 -->4/8, bactrim/linezolid 4/8>> FEN - regular diet, megace, food from  home, ensure VTE -SCDs, sq heparin Foley -none Follow up - Dr. Luisa Hart, pain clinic, IR  Plan: Will switch to oral antibiotics today (bactrim and linezolid). Home health ordered. I called and spoke with her pain clinic, she has an appointment 4/13, ok to prescribe narcotic at discharge to get her to that appointment. Tentatively plan for discharge tomorrow. I called and updated her daughter.   LOS: 3 days    Franne Forts, [facility] 02/02/2020, 9:28 AM Please see Amion for pager number during day hours 7:00am-4:30pm

## 2020-02-03 MED ORDER — ENSURE ENLIVE PO LIQD
237.0000 mL | Freq: Three times a day (TID) | ORAL | Status: DC
  Administered 2020-02-03 (×2): 237 mL via ORAL

## 2020-02-03 MED ORDER — COLLAGENASE 250 UNIT/GM EX OINT
TOPICAL_OINTMENT | Freq: Every day | CUTANEOUS | Status: DC
  Filled 2020-02-03: qty 30

## 2020-02-03 MED ORDER — ENSURE ENLIVE PO LIQD: 237.0000 mL | Freq: Three times a day (TID) | ORAL | 12 refills | Status: DC

## 2020-02-03 MED ORDER — COLLAGENASE 250 UNIT/GM EX OINT: TOPICAL_OINTMENT | Freq: Every day | CUTANEOUS | 0 refills | Status: DC

## 2020-02-03 MED ORDER — SODIUM CHLORIDE 0.9% FLUSH: 5.0000 mL | Freq: Every day | INTRAVENOUS | 1 refills | Status: DC

## 2020-02-03 NOTE — Progress Notes (Signed)
Pt alert and oriented.  D/C instructions given to daughter and she was instructed on how to change both dressing and how to flush the drain, and to apply cream to her sacrum wound.  Pt was d/cd home with daughter.

## 2020-02-03 NOTE — Consult Note (Addendum)
WOC Nurse Consult Note: Reason for Consult: change in the wound status WOC nurse assessment 4/5/21intdicated MASD, I feel this was present on admission but has evolved since admission based on presentation Wound type: Unstageable Pressure injury Pressure Injury POA: Yes Measurement: 5cm x 1.5cm x 0.1cm  Wound bed:100% soft yellow/black  Drainage (amount, consistency, odor) minimal, serous, no odor Periwound: intact, no real evidence of MASD at this time Dressing procedure/placement/frequency:  DCed Gerhardt's Added enzymatic debridement ointment for necrotic wound Patient turning herself will not add LALM for that reason Made RD aware of wound for any options for supplementation; however she has resource breeze at the bedside and ensure.  She is complaining of pain with eating and drinking.   Discussed with CCS the wound status, requested image. Requested [facility] as well for wound care teaching and monitoring.   Discussed POC with patient and bedside nurse.  Re consult if needed, will not follow at this time. Thanks  Manolo Bosket [employer], RN,CWOCN, CNS, CWON-AP [phone])

## 2020-02-03 NOTE — Progress Notes (Signed)
Referring Physician(s): Ramirez,A  Supervising Physician: Malachy Moan  Patient Status:  [facility] - In-pt  Chief Complaint: Pelvic pain/fluid collection   Subjective: Pt awaiting d/c home today; no new c/o ; buttocks pain still present but not worsening   Allergies: Penicillins  Medications: Prior to Admission medications   Medication Sig Start Date End Date Taking? Authorizing Provider  amLODipine (NORVASC) 10 MG tablet Take 10 mg by mouth daily. 09/25/19  Yes [provider]  docusate sodium (COLACE) 100 MG capsule Take 1 capsule (100 mg total) by mouth 2 (two) times daily as needed for mild constipation. 01/19/20  Yes Juliet Rude, PA-C  HYDROcodone-acetaminophen (NORCO) 10-325 MG tablet Take 1 tablet by mouth every 6 (six) hours as needed for severe pain. 01/23/20  Yes Sherrie George, PA-C  Multiple Vitamin (MULTIVITAMIN WITH MINERALS) TABS tablet Take 1 tablet by mouth daily. 01/19/20  Yes Trixie Deis R, PA-C  rosuvastatin (CRESTOR) 20 MG tablet Take 20 mg by mouth daily. 07/30/18  Yes [provider]  acetaminophen (TYLENOL) 500 MG tablet Take 2 tablets (1,000 mg total) by mouth every 6 (six) hours. 02/02/20   Meuth, Brooke A, PA-C  collagenase (SANTYL) ointment Apply topically daily. 02/03/20   Meuth, Brooke A, PA-C  feeding supplement, ENSURE ENLIVE, (ENSURE ENLIVE) LIQD Take 237 mLs by mouth 3 (three) times daily between meals. 02/03/20   Meuth, Brooke A, PA-C  ferrous sulfate 325 (65 FE) MG tablet Take 1 tablet (325 mg total) by mouth daily with breakfast. 01/20/20   Juliet Rude, PA-C  folic acid (FOLVITE) 1 MG tablet Take 1 tablet (1 mg total) by mouth daily. 01/20/20   Noralee Stain, DO  gabapentin (NEURONTIN) 100 MG capsule Take 2 capsules (200 mg total) by mouth 3 (three) times daily. 01/23/20   Sherrie George, PA-C  ibuprofen (ADVIL) 200 MG tablet You can take 2 tablets every 6 hours as needed for pain.  Follow package directions.  You can  buy this over-the-counter at any drugstore. 01/23/20   Sherrie George, PA-C  linezolid (ZYVOX) 600 MG tablet Take 1 tablet (600 mg total) by mouth 2 (two) times daily for 7 days. 02/02/20 02/09/20  Meuth, Lina Sar, PA-C  megestrol (MEGACE) 40 MG tablet Take 4 tablets (160 mg total) by mouth daily. 02/03/20 03/04/20  Meuth, [place]oke A, PA-C  metroNIDAZOLE (FLAGYL) 500 MG tablet Take 1 tablet (500 mg total) by mouth every 8 (eight) hours. 01/23/20   Sherrie George, PA-C  oxyCODONE 10 MG TABS Take 0.5-1 tablets (5-10 mg total) by mouth every 6 (six) hours as needed for severe pain. 02/02/20   Meuth, [place]oke A, PA-C  polyethylene glycol (MIRALAX / GLYCOLAX) 17 g packet Take 17 g by mouth daily as needed for mild constipation. 02/02/20   Meuth, [place]oke A, PA-C  sodium chloride flush (NS) 0.9 % SOLN 5 mLs by Intracatheter route daily for 28 days. 02/03/20 03/02/20  Meuth, Brooke A, PA-C  sulfamethoxazole-trimethoprim (BACTRIM DS) 800-160 MG tablet Take 1 tablet by mouth every 12 (twelve) hours. 02/02/20   Meuth, Lina Sar, PA-C     Vital Signs: BP 122/68 (BP Location: Right Arm)   Pulse 76   Temp 98.3 F (36.8 C) (Oral)   Resp 17   Ht 5\' 8"  (1.727 m)   Wt 115 lb (52.2 kg)   SpO2 100%   BMI 17.49 kg/m   Physical Exam awake, alert; Right transgluteal drain intact, some tenderness to palpation, output 10-15 cc bloody fluid.  Drain irrigated without difficulty  Imaging: No results found.  Labs:  CBC: Recent Labs    01/31/20 0426 01/31/20 1358 02/01/20 0506 02/02/20 0459  WBC 15.1* 14.1* 12.1* 9.9  HGB 8.6* 9.3* 8.7* 9.0*  HCT 26.4* 27.8* 26.8* 27.6*  PLT 334 333 312 339    COAGS: Recent Labs    09/11/19 1006 01/12/20 0626 01/30/20 0505  INR 1.2 1.0 1.0    BMP: Recent Labs    01/29/20 2340 01/29/20 2340 01/31/20 0426 01/31/20 1358 02/01/20 0506 02/02/20 0459  NA 141  --  139  --  140 142  K 2.6*   < > 2.5* 3.3* 3.4* 3.7  CL 107  --  110  --  110 111  CO2 25  --  21*  --  23 23   GLUCOSE 106*  --  113*  --  97 86  BUN 5*  --  7*  --  8 5*  CALCIUM 8.9  --  8.0*  --  8.4* 8.5*  CREATININE 0.62  --  0.49  --  0.60 0.50  GFRNONAA >60  --  >60  --  >60 >60  GFRAA >60  --  >60  --  >60 >60   < > = values in this interval not displayed.    LIVER FUNCTION TESTS: Recent Labs    01/14/20 0341 01/15/20 0333 01/16/20 0347 01/29/20 2340  BILITOT 0.5 0.4 0.3 0.7  AST 14* 13* 11* 12*  ALT 10 8 7 8   ALKPHOS 49 45 39 68  PROT 4.7* 4.4* 4.3* 6.1*  ALBUMIN 2.2* 1.8* 1.8* 2.5*    Assessment and Plan: Pt s/pdrainage of post cholecystectomy gallbladder fossa and pelvic fluid collections on 09/11/2019. Drains weresubsequently removed by surgical team. She has a history of recurrent small bowel obstruction and is status post exploratory lap with small bowel resection and lysis of adhesions due to high-grade SBO on 01/12/2020; s/p drainage of loculated pelvic cul de sac fluid collection 4/5;afebrile; last WBC nl; hgb 9, creat nl; as OP pelvic drain should be irrigated with 5 cc sterile NS once daily, OP recorded and dressing changed every 1-2 days; will set up for f/u IR drain clinic visit next week   Electronically Signed: D. Rowe Robert, PA-C 02/03/2020, 5:39 PM   I spent a total of 15 minutes at the the patient's bedside AND on the patient's hospital floor or unit, greater than 50% of which was counseling/coordinating care for pelvic abscess drain    Patient ID: Samantha Gonzales, female   DOB: [birth year], 79 y.o.   MRN: [id number]

## 2020-02-06 ENCOUNTER — Other Ambulatory Visit: Payer: Self-pay | Admitting: Surgery

## 2020-02-06 DIAGNOSIS — K651 Peritoneal abscess: Secondary | ICD-10-CM

## 2020-02-14 ENCOUNTER — Ambulatory Visit
Admission: RE | Admit: 2020-02-14 | Discharge: 2020-02-14 | Disposition: A | Discharge status: Home / Self Care | Payer: Medicare PPO | Source: Ambulatory Visit | Attending: Surgery | Admitting: Surgery

## 2020-02-14 ENCOUNTER — Ambulatory Visit
Admission: RE | Admit: 2020-02-14 | Discharge: 2020-02-14 | Disposition: A | Discharge status: Home / Self Care | Payer: Medicare PPO | Source: Ambulatory Visit | Attending: Radiology | Admitting: Radiology

## 2020-02-14 ENCOUNTER — Encounter: Payer: Self-pay | Admitting: Radiology

## 2020-02-14 ENCOUNTER — Other Ambulatory Visit: Payer: Self-pay | Admitting: Surgery

## 2020-02-14 DIAGNOSIS — K651 Peritoneal abscess: Secondary | ICD-10-CM

## 2020-02-14 HISTORY — PX: IR RADIOLOGIST EVAL & MGMT: IMG5224

## 2020-02-14 NOTE — Progress Notes (Signed)
Chief Complaint: Patient was seen in consultation today for drain follow-up  Referring Physician(s): Allred,Darrell K; Cornett  History of Present Illness: Samantha Gonzales is a 79 y.o. female with history of exploratory laparotomy, bowel resection, lysis of adhesions due to small bowel obstruction on 01/12/2020.  Patient was found to have a large pelvic abscess on 01/30/2020 and underwent a right transgluteal drain placement on 01/30/2000.  The abscess was treated with percutaneous drain placement and antibiotics.  Patient was recently seen by general surgery.  Patient is accompanied by her daughter.  Patient is very uncomfortable from her right transgluteal drain as well as a pressure ulceration overlying the coccyx.  Patient denies fevers or chills.  Daughter reports minimal bloody output from the drain.  Daughter has been doing regular dressing changes for the decubitus ulcer.  Past Medical History:  Diagnosis Date  . Hyperlipidemia   . Hypertension   . Syncope 10/27/2019    Past Surgical History:  Procedure Laterality Date  . CHOLECYSTECTOMY N/A 09/05/2019   Procedure: LAPAROSCOPIC CHOLECYSTECTOMY WITH INTRAOPERATIVE CHOLANGIOGRAM;  Surgeon: Harriette Bouillon, MD;  Location: [facility] OR;  Service: General;  Laterality: N/A;  . ERCP N/A 09/07/2019   Procedure: ENDOSCOPIC RETROGRADE CHOLANGIOPANCREATOGRAPHY (ERCP);  Surgeon: Vida Rigger, MD;  Location: [facility] ENDOSCOPY;  Service: Endoscopy;  Laterality: N/A;  . IR RADIOLOGIST EVAL & MGMT  02/14/2020  . LAPAROTOMY N/A 01/12/2020   Procedure: EXPLORATORY LAPAROTOMY LYSIS OF ADHESIONS AND SMALL BOWEL OBSTRUCTION;  Surgeon: Harriette Bouillon, MD;  Location: [facility] ORS;  Service: General;  Laterality: N/A;  . PANCREATIC STENT PLACEMENT  09/07/2019   Procedure: PANCREATIC STENT PLACEMENT;  Surgeon: Vida Rigger, MD;  Location: [facility] ENDOSCOPY;  Service: Endoscopy;;  . REMOVAL OF STONES  09/07/2019   Procedure: REMOVAL OF STONES;  Surgeon: Vida Rigger, MD;   Location: [facility] ENDOSCOPY;  Service: Endoscopy;;  . Dennison Mascot  09/07/2019   Procedure: Dennison Mascot;  Surgeon: Vida Rigger, MD;  Location: [facility] ENDOSCOPY;  Service: Endoscopy;;  . TUBAL LIGATION      Allergies: Penicillins  Medications: Prior to Admission medications   Medication Sig Start Date End Date Taking? Authorizing Provider  acetaminophen (TYLENOL) 500 MG tablet Take 2 tablets (1,000 mg total) by mouth every 6 (six) hours. 02/02/20   Meuth, Brooke A, PA-C  amLODipine (NORVASC) 10 MG tablet Take 10 mg by mouth daily. 09/25/19   [provider]  collagenase (SANTYL) ointment Apply topically daily. 02/03/20   Meuth, Brooke A, PA-C  docusate sodium (COLACE) 100 MG capsule Take 1 capsule (100 mg total) by mouth 2 (two) times daily as needed for mild constipation. 01/19/20   Juliet Rude, PA-C  feeding supplement, ENSURE ENLIVE, (ENSURE ENLIVE) LIQD Take 237 mLs by mouth 3 (three) times daily between meals. 02/03/20   Meuth, Brooke A, PA-C  ferrous sulfate 325 (65 FE) MG tablet Take 1 tablet (325 mg total) by mouth daily with breakfast. 01/20/20   Juliet Rude, PA-C  folic acid (FOLVITE) 1 MG tablet Take 1 tablet (1 mg total) by mouth daily. 01/20/20   Noralee Stain, DO  gabapentin (NEURONTIN) 100 MG capsule Take 2 capsules (200 mg total) by mouth 3 (three) times daily. 01/23/20   Sherrie George, PA-C  ibuprofen (ADVIL) 200 MG tablet You can take 2 tablets every 6 hours as needed for pain.  Follow package directions.  You can buy this over-the-counter at any drugstore. 01/23/20   Sherrie George, PA-C  megestrol (MEGACE) 40 MG tablet Take 4 tablets (160  mg total) by mouth daily. 02/03/20 03/04/20  Meuth, Brooke A, PA-C  Multiple Vitamin (MULTIVITAMIN WITH MINERALS) TABS tablet Take 1 tablet by mouth daily. 01/19/20   Juliet Rude, PA-C  oxyCODONE 10 MG TABS Take 0.5-1 tablets (5-10 mg total) by mouth every 6 (six) hours as needed for severe pain. 02/02/20   Meuth, Brooke A, PA-C    polyethylene glycol (MIRALAX / GLYCOLAX) 17 g packet Take 17 g by mouth daily as needed for mild constipation. 02/02/20   Meuth, Brooke A, PA-C  rosuvastatin (CRESTOR) 20 MG tablet Take 20 mg by mouth daily. 07/30/18   [provider]  sodium chloride flush (NS) 0.9 % SOLN 5 mLs by Intracatheter route daily for 28 days. 02/03/20 03/02/20  Meuth, Brooke A, PA-C  sulfamethoxazole-trimethoprim (BACTRIM DS) 800-160 MG tablet Take 1 tablet by mouth every 12 (twelve) hours. 02/02/20   Meuth, Lina Sar, PA-C     Family History  Problem Relation Age of Onset  . CAD Neg Hx     Social History   Socioeconomic History  . Marital status: Widowed    Spouse name: Not on file  . Number of children: Not on file  . Years of education: Not on file  . Highest education level: Not on file  Occupational History  . Not on file  Tobacco Use  . Smoking status: Never Smoker  . Smokeless tobacco: Current User    Types: Chew  Substance and Sexual Activity  . Alcohol use: No  . Drug use: No  . Sexual activity: Not Currently  Other Topics Concern  . Not on file  Social History Narrative  . Not on file   Social Determinants of Health   Financial Resource Strain:   . Difficulty of Paying Living Expenses:   Food Insecurity:   . Worried About Programme researcher, broadcasting/film/video in the Last Year:   . Barista in the Last Year:   Transportation Needs:   . Freight forwarder (Medical):   Marland Kitchen Lack of Transportation (Non-Medical):   Physical Activity:   . Days of Exercise per Week:   . Minutes of Exercise per Session:   Stress:   . Feeling of Stress :   Social Connections:   . Frequency of Communication with Friends and Family:   . Frequency of Social Gatherings with Friends and Family:   . Attends Religious Services:   . Active Member of Clubs or Organizations:   . Attends Banker Meetings:   Marland Kitchen Marital Status:       Review of Systems  Constitutional: Negative for chills and fever.   Musculoskeletal:       Pelvic pain related to the pressure ulcer and percutaneous drain.    Vital Signs: BP (!) 150/76   Pulse 91   Temp 98.1 F (36.7 C)   SpO2 97%   Physical Exam Musculoskeletal:     Comments: Right transgluteal drain is intact.  No drainage around the catheter.  Small amount of bloody output within the gravity bag.  Decubitus ulcer overlying the coccyx.  Gauze packing within the ulceration.        Imaging: CT ABDOMEN PELVIS WO CONTRAST  Result Date: 02/14/2020 CLINICAL DATA:  History of exploratory laparotomy with bowel resection and lysis of adhesions due to small bowel obstruction on 01/12/2020. Patient developed a postoperative pelvic abscess that was treated with percutaneous drain on 01/30/2020. Previous ERCP with sphincterotomy. Post-contrast CT could not be performed today due  to poor venous access. EXAM: CT ABDOMEN AND PELVIS WITHOUT CONTRAST TECHNIQUE: Multidetector CT imaging of the abdomen and pelvis was performed following the standard protocol without IV contrast. COMPARISON:  CT abdomen and pelvis 05/31/2020 FINDINGS: Lower chest: Lung bases are clear without pleural effusions. Hepatobiliary: Small amount of pneumobilia compatible with previous sphincterotomy procedure. Cholecystectomy. Again noted is a bilobed cyst in the central aspect of the liver with a combined size of 3.0 cm. Pancreas: Unremarkable. No pancreatic ductal dilatation or surrounding inflammatory changes. Spleen: Normal in size without focal abnormality. Adrenals/Urinary Tract: Adrenal glands are poorly characterized on this noncontrast examination. Again noted are bilateral renal cysts without hydronephrosis. Small amount of fluid in the urinary bladder. Stomach/Bowel: Large amount of stool in the rectum. Gas and stool throughout the colon. Surgical bowel clips in the central lower abdomen and upper pelvic region. Fluid-filled loops of small bowel in left lower abdomen but no evidence for  an obstructive process. Vascular/Lymphatic: Abdominal aorta is diffusely calcified without aneurysm. No significant abdominal or pelvic lymphadenopathy. However, there is a soft tissue nodular structure in the right lower abdomen which is similar to the recent comparison examination. This area measures up to 1.6 cm. This could represent a prominent mesenteric lymph node but indeterminate. Reproductive: Poorly characterized on this noncontrast examination. Other: Negative for ascites. Negative for free intraperitoneal air. Focal soft tissue thickening along the anterior abdominal wall near the umbilicus is similar to the previous examination and could represent postoperative changes. The large pelvic fluid collection has resolved following placement of the right transgluteal percutaneous drain. Drainage catheter is positioned in the central aspect of the pelvis. No large residual fluid collections in the pelvis although limited evaluation on this noncontrast examination. Musculoskeletal: There is small amount of gas in the subcutaneous tissues just superficial to the coccyx. Patient has a known decubitus ulceration in this area. Multilevel degenerative facet disease in the lumbar spine. Mild anterolisthesis in lower lumbar spine. Bony cortex of the coccyx adjacent to the decubitus ulceration is grossly intact. IMPRESSION: 1. Large pelvic abscess has resolved with the percutaneous drain. No new large abdominopelvic fluid collections. 2. Large amount of stool throughout the colon, particularly the rectum. Findings are suggestive for constipation. 3. Indeterminate nodule in the right lower quadrant measuring 1.6 cm. This is similar to the recent exam on 01/30/2020. This could represent a prominent mesenteric lymph node. Consider a 3-6 month follow-up CT with IV and oral contrast to ensure stability. 4. Decubitus ulceration adjacent to the coccyx. 5. Postoperative changes in the abdomen. No evidence for a bowel  obstruction. Electronically Signed   By: Markus Daft M.D.   On: 02/14/2020 12:22   CT ABDOMEN PELVIS W CONTRAST  Result Date: 01/30/2020 CLINICAL DATA:  Nausea and vomiting. EXAM: CT ABDOMEN AND PELVIS WITH CONTRAST TECHNIQUE: Multidetector CT imaging of the abdomen and pelvis was performed using the standard protocol following bolus administration of intravenous contrast. CONTRAST:  155mL OMNIPAQUE IOHEXOL 300 MG/ML  SOLN COMPARISON:  January 11, 2020 FINDINGS: Lower chest: The lung bases are clear. The heart size is normal. Hepatobiliary: Hepatic cysts are noted. Status post cholecystectomy.There is no biliary ductal dilation. Pancreas: Normal contours without ductal dilatation. No peripancreatic fluid collection. Spleen: Unremarkable. Adrenals/Urinary Tract: --Adrenal glands: Unremarkable. --Right kidney/ureter: No hydronephrosis or radiopaque kidney stones. --Left kidney/ureter: No hydronephrosis or radiopaque kidney stones. --Urinary bladder: Unremarkable. Stomach/Bowel: --Stomach/Duodenum: No hiatal hernia or other gastric abnormality. Normal duodenal course and caliber. --Small bowel: The previously demonstrated small-bowel obstruction appears  to resolved. These there is a surgical anastomosis in the low midline abdomen. --Colon: There is some questionable wall thickening of the sigmoid colon. There may be some wall thickening at the hepatic flexure. There is an enhancing mass in the right lower quadrant measuring approximately 1.7 cm (axial series 2, image 47). This appears to be present on recent prior studies but is difficult to appreciate on studies dating back to 2020. --Appendix: Normal. Vascular/Lymphatic: Atherosclerotic calcification is present within the non-aneurysmal abdominal aorta, without hemodynamically significant stenosis. 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 There are enlarged pelvic veins. --No retroperitoneal lymphadenopathy. --No mesenteric lymphadenopathy. --No  pelvic or inguinal lymphadenopathy. Reproductive: Unremarkable Other: The patient's pelvis there has been interval development of a complex collection measuring approximately 9.5 x 8.6 by 7.1 cm. This collection contains hyperdense components in the nondependent portion. This mass abuts the surgical anastomosis. There is a midline incision. Body wall edema is noted. Musculoskeletal. No acute displaced fractures. IMPRESSION: 1. New pelvic mass measuring approximately 9.5 cm abutting the new small bowel surgical anastomosis. This is favored to represent a postoperative hematoma. A developing abscess or contained perforation seems less likely given the lack air within this collection. 2. No evidence for small bowel obstruction. 3. Scattered areas of mild colonic wall thickening is favored to be secondary to underdistention, however mild colitis is not excluded. 4. Enhancing 1.7 cm mass in the right lower quadrant may represent an enlarged lymph node. A nonemergent follow-up examination with IV and oral contrast would be useful for further characterization. 5. Prominent pelvic veins which are nonspecific but can be seen in patients with pelvic congestion syndrome. Aortic Atherosclerosis (ICD10-I70.0). Electronically Signed   By: Katherine Mantle M.D.   On: 01/30/2020 02:00   DG Sinus/Fist Tube Chk-Non GI  Result Date: 02/14/2020 INDICATION: 79 year old with history of abdominal surgery and postoperative abscess. Abscess was treated with a percutaneous drain. Minimal bloody output from the drain. EXAM: DRAIN INJECTION WITH FLUOROSCOPY DRAIN REMOVAL MEDICATIONS: None ANESTHESIA/SEDATION: None COMPLICATIONS: None immediate. PROCEDURE: Patient was placed prone on the fluoroscopic table. Scout image was obtained. Right transgluteal drain was injected with 10 mL Omnipaque 300. Majority of the contrast was aspirated at the end of the procedure. Retention suture was cut. The catheter was cut and completely removed without  complication. Small dressing was placed over the old drain site. In addition, the patient has a decubitus ulceration overlying the coccyx and a new overlying bandage was placed. FINDINGS: Drain injection demonstrates a small residual collection around the drain. No evidence for a bowel fistula. Majority of the contrast was aspirated at the end of the procedure. IMPRESSION: 1. Large pelvic abscess collection has resolved based on the recent CT. No evidence for a bowel fistula on the drain injection. 2. Drain was removed. Electronically Signed   By: Richarda Overlie M.D.   On: 02/14/2020 12:48   CT IMAGE GUIDED DRAINAGE BY PERCUTANEOUS CATHETER  Result Date: 01/30/2020 CLINICAL DATA:  Recent lysis of adhesions for small-bowel obstruction. New loculated pelvic collection. Abdominal pain. EXAM: CT GUIDED DRAINAGE OF PELVIC ABSCESS ANESTHESIA/SEDATION: Intravenous Fentanyl and Versed 2mg  were administered as conscious sedation during continuous monitoring of the patient's level of consciousness and physiological / cardiorespiratory status by the radiology RN, with a total moderate sedation time of 17 minutes. PROCEDURE: The procedure, risks, benefits, and alternatives were explained to the patient. Questions regarding the procedure were encouraged and answered. The patient understands and consents to the  procedure. Patient placed prone. Select axial scans through the pelvis obtained. An appropriate skin entry site was determined and marked. The operative field was prepped with chlorhexidinein a sterile fashion, and a sterile drape was applied covering the operative field. A sterile gown and sterile gloves were used for the procedure. Local anesthesia was provided with 1% Lidocaine. Under CT fluoroscopic guidance, 18 gauge percutaneous entry needle advanced into the collection. Bloody purulent material could be aspirated. Amplatz guidewire advanced easily, position confirmed on CT. Tract dilated to facilitate  placement 10 French pigtail drain catheter, formed centrally within the collection. 30 mL of purulent bloody material were aspirated, sent for Gram stain and culture. Catheter secured externally with 0 Prolene suture and StatLock and placed to gravity drain bag. The patient tolerated the procedure well. COMPLICATIONS: None immediate FINDINGS: Loculated cul-de-sac fluid collection was localized. 10 French pigtail drain catheter placed as above. 30 mL of bloody purulent material aspirated, sent for Gram stain and culture. IMPRESSION: 1. Technically successful CT guided pelvic abscess drain catheter placement Electronically Signed   By: Corlis Leak  Hassell M.D.   On: 01/30/2020 13:54   IR Radiologist Eval & Mgmt  Result Date: 02/14/2020 Please refer to notes tab for details about interventional procedure. (Op Note)   Labs:  CBC: Recent Labs    01/31/20 0426 01/31/20 1358 02/01/20 0506 02/02/20 0459  WBC 15.1* 14.1* 12.1* 9.9  HGB 8.6* 9.3* 8.7* 9.0*  HCT 26.4* 27.8* 26.8* 27.6*  PLT 334 333 312 339    COAGS: Recent Labs    09/11/19 1006 01/12/20 0626 01/30/20 0505  INR 1.2 1.0 1.0    BMP: Recent Labs    01/29/20 2340 01/29/20 2340 01/31/20 0426 01/31/20 1358 02/01/20 0506 02/02/20 0459  NA 141  --  139  --  140 142  K 2.6*   < > 2.5* 3.3* 3.4* 3.7  CL 107  --  110  --  110 111  CO2 25  --  21*  --  23 23  GLUCOSE 106*  --  113*  --  97 86  BUN 5*  --  7*  --  8 5*  CALCIUM 8.9  --  8.0*  --  8.4* 8.5*  CREATININE 0.62  --  0.49  --  0.60 0.50  GFRNONAA >60  --  >60  --  >60 >60  GFRAA >60  --  >60  --  >60 >60   < > = values in this interval not displayed.    LIVER FUNCTION TESTS: Recent Labs    01/14/20 0341 01/15/20 0333 01/16/20 0347 01/29/20 2340  BILITOT 0.5 0.4 0.3 0.7  AST 14* 13* 11* 12*  ALT 10 8 7 8   ALKPHOS 49 45 39 68  PROT 4.7* 4.4* 4.3* 6.1*  ALBUMIN 2.2* 1.8* 1.8* 2.5*    TUMOR MARKERS: No results for input(s): AFPTM, CEA, CA199, CHROMGRNA in  the last 8760 hours.  Assessment and Plan:  79 year old with history of a large pelvic abscess following abdominal surgery.  The abdominal abscess has resolved based on today's CT and there is no evidence for a bowel fistula on the drain injection.  Therefore, the drain was removed.  Patient's daughter had many questions about the decubitus ulceration.  I recommended that they contact her primary care physician to coordinate long-term care for this wound.  Electronically Signed: Arn Medaldam R Henreitta Spittler 02/14/2020, 1:15 PM   I spent a total of    10 Minutes in face to face in  clinical consultation, greater than 50% of which was counseling/coordinating care for drain care.  Patient ID: Samantha Gonzales, female   DOB: Jan 04, 1941, 79 y.o.   MRN: [id number]

## 2020-02-29 ENCOUNTER — Encounter (HOSPITAL_COMMUNITY): Payer: Self-pay | Admitting: Internal Medicine

## 2020-02-29 ENCOUNTER — Inpatient Hospital Stay (HOSPITAL_COMMUNITY): Payer: Medicare PPO

## 2020-02-29 ENCOUNTER — Inpatient Hospital Stay (HOSPITAL_COMMUNITY)
Admission: EM | Admit: 2020-02-29 | Discharge: 2020-03-05 | DRG: 682 | Disposition: A | Discharge status: Home Health | Payer: Medicare PPO | Attending: Internal Medicine | Admitting: Internal Medicine

## 2020-02-29 DIAGNOSIS — Z720 Tobacco use: Secondary | ICD-10-CM

## 2020-02-29 DIAGNOSIS — L98499 Non-pressure chronic ulcer of skin of other sites with unspecified severity: Secondary | ICD-10-CM | POA: Diagnosis present

## 2020-02-29 DIAGNOSIS — E43 Unspecified severe protein-calorie malnutrition: Secondary | ICD-10-CM | POA: Diagnosis present

## 2020-02-29 DIAGNOSIS — N179 Acute kidney failure, unspecified: Principal | ICD-10-CM | POA: Diagnosis present

## 2020-02-29 DIAGNOSIS — Z79899 Other long term (current) drug therapy: Secondary | ICD-10-CM

## 2020-02-29 DIAGNOSIS — I10 Essential (primary) hypertension: Secondary | ICD-10-CM | POA: Diagnosis present

## 2020-02-29 DIAGNOSIS — R109 Unspecified abdominal pain: Secondary | ICD-10-CM

## 2020-02-29 DIAGNOSIS — E785 Hyperlipidemia, unspecified: Secondary | ICD-10-CM | POA: Diagnosis present

## 2020-02-29 DIAGNOSIS — D519 Vitamin B12 deficiency anemia, unspecified: Secondary | ICD-10-CM | POA: Diagnosis not present

## 2020-02-29 DIAGNOSIS — R627 Adult failure to thrive: Secondary | ICD-10-CM

## 2020-02-29 DIAGNOSIS — E538 Deficiency of other specified B group vitamins: Secondary | ICD-10-CM | POA: Diagnosis present

## 2020-02-29 DIAGNOSIS — Z515 Encounter for palliative care: Secondary | ICD-10-CM | POA: Diagnosis present

## 2020-02-29 DIAGNOSIS — E876 Hypokalemia: Secondary | ICD-10-CM | POA: Diagnosis present

## 2020-02-29 DIAGNOSIS — Z681 Body mass index (BMI) 19 or less, adult: Secondary | ICD-10-CM | POA: Diagnosis not present

## 2020-02-29 DIAGNOSIS — D539 Nutritional anemia, unspecified: Secondary | ICD-10-CM | POA: Diagnosis present

## 2020-02-29 DIAGNOSIS — Z7189 Other specified counseling: Secondary | ICD-10-CM | POA: Diagnosis not present

## 2020-02-29 DIAGNOSIS — Z880 Allergy status to penicillin: Secondary | ICD-10-CM

## 2020-02-29 DIAGNOSIS — L8990 Pressure ulcer of unspecified site, unspecified stage: Secondary | ICD-10-CM | POA: Diagnosis present

## 2020-02-29 DIAGNOSIS — L89154 Pressure ulcer of sacral region, stage 4: Secondary | ICD-10-CM | POA: Diagnosis present

## 2020-02-29 DIAGNOSIS — L89150 Pressure ulcer of sacral region, unstageable: Secondary | ICD-10-CM | POA: Diagnosis not present

## 2020-02-29 LAB — CBC WITH DIFFERENTIAL/PLATELET
Abs Immature Granulocytes: 0.04 10*3/uL (ref 0.00–0.07)
Basophils Absolute: 0 10*3/uL (ref 0.0–0.1)
Basophils Relative: 0 %
Eosinophils Absolute: 0 10*3/uL (ref 0.0–0.5)
Eosinophils Relative: 0 %
HCT: 35.6 % — ABNORMAL LOW (ref 36.0–46.0)
Hemoglobin: 11.7 g/dL — ABNORMAL LOW (ref 12.0–15.0)
Immature Granulocytes: 0 %
Lymphocytes Relative: 9 %
Lymphs Abs: 0.9 10*3/uL (ref 0.7–4.0)
MCH: 34.2 pg — ABNORMAL HIGH (ref 26.0–34.0)
MCHC: 32.9 g/dL (ref 30.0–36.0)
MCV: 104.1 fL — ABNORMAL HIGH (ref 80.0–100.0)
Monocytes Absolute: 0.6 10*3/uL (ref 0.1–1.0)
Monocytes Relative: 6 %
Neutro Abs: 9 10*3/uL — ABNORMAL HIGH (ref 1.7–7.7)
Neutrophils Relative %: 85 %
Platelets: 309 10*3/uL (ref 150–400)
RBC: 3.42 MIL/uL — ABNORMAL LOW (ref 3.87–5.11)
RDW: 18.6 % — ABNORMAL HIGH (ref 11.5–15.5)
WBC: 10.6 10*3/uL — ABNORMAL HIGH (ref 4.0–10.5)
nRBC: 0 % (ref 0.0–0.2)

## 2020-02-29 LAB — COMPREHENSIVE METABOLIC PANEL
ALT: 12 U/L (ref 0–44)
AST: 15 U/L (ref 15–41)
Albumin: 2.9 g/dL — ABNORMAL LOW (ref 3.5–5.0)
Alkaline Phosphatase: 69 U/L (ref 38–126)
Anion gap: 14 (ref 5–15)
BUN: 10 mg/dL (ref 8–23)
CO2: 20 mmol/L — ABNORMAL LOW (ref 22–32)
Calcium: 9.4 mg/dL (ref 8.9–10.3)
Chloride: 108 mmol/L (ref 98–111)
Creatinine, Ser: 0.9 mg/dL (ref 0.44–1.00)
GFR calc Af Amer: >60 mL/min (ref >60)
GFR calc non Af Amer: >60 mL/min (ref >60)
Glucose, Bld: 87 mg/dL (ref 70–99)
Potassium: 2.4 mmol/L — CL (ref 3.5–5.1)
Sodium: 142 mmol/L (ref 135–145)
Total Bilirubin: 0.6 mg/dL (ref 0.3–1.2)
Total Protein: 5.9 g/dL — ABNORMAL LOW (ref 6.5–8.1)

## 2020-02-29 LAB — SEDIMENTATION RATE: Sed Rate: 25 mm/hr — ABNORMAL HIGH (ref 0–22)

## 2020-02-29 LAB — LIPASE, BLOOD: Lipase: 21 U/L (ref 11–51)

## 2020-02-29 LAB — C-REACTIVE PROTEIN: CRP: 0.7 mg/dL (ref <1.0)

## 2020-02-29 MED ORDER — POLYETHYLENE GLYCOL 3350 17 G PO PACK: 17.0000 g | PACK | Freq: Every day | ORAL | Status: DC | PRN

## 2020-02-29 MED ORDER — FOLIC ACID 1 MG PO TABS
1.0000 mg | ORAL_TABLET | Freq: Every day | ORAL | Status: DC
  Administered 2020-02-29 – 2020-03-05 (×6): 1 mg via ORAL
  Filled 2020-02-29 (×6): qty 1

## 2020-02-29 MED ORDER — MEGESTROL ACETATE 40 MG PO TABS
160.0000 mg | ORAL_TABLET | Freq: Every day | ORAL | Status: DC
  Administered 2020-02-29 – 2020-03-05 (×5): 160 mg via ORAL
  Filled 2020-02-29 (×6): qty 4

## 2020-02-29 MED ORDER — POLYETHYLENE GLYCOL 3350 17 G PO PACK
17.0000 g | PACK | Freq: Every day | ORAL | Status: DC | PRN
  Filled 2020-02-29: qty 1

## 2020-02-29 MED ORDER — ONDANSETRON HCL 4 MG/2ML IJ SOLN
4.0000 mg | Freq: Once | INTRAMUSCULAR | Status: AC
  Administered 2020-02-29: 4 mg via INTRAVENOUS
  Filled 2020-02-29: qty 2

## 2020-02-29 MED ORDER — AMLODIPINE BESYLATE 10 MG PO TABS
10.0000 mg | ORAL_TABLET | Freq: Every day | ORAL | Status: DC
  Administered 2020-02-29 – 2020-03-05 (×6): 10 mg via ORAL
  Filled 2020-02-29: qty 1
  Filled 2020-02-29: qty 2
  Filled 2020-02-29 (×4): qty 1

## 2020-02-29 MED ORDER — MORPHINE SULFATE (PF) 4 MG/ML IV SOLN
4.0000 mg | Freq: Once | INTRAVENOUS | Status: AC
  Administered 2020-02-29: 4 mg via INTRAVENOUS
  Filled 2020-02-29: qty 1

## 2020-02-29 MED ORDER — SULFAMETHOXAZOLE-TRIMETHOPRIM 800-160 MG PO TABS
1.0000 | ORAL_TABLET | Freq: Two times a day (BID) | ORAL | Status: DC
  Administered 2020-02-29 – 2020-03-01 (×2): 1 via ORAL
  Filled 2020-02-29 (×2): qty 1

## 2020-02-29 MED ORDER — OXYCODONE HCL 5 MG PO TABS
5.0000 mg | ORAL_TABLET | Freq: Four times a day (QID) | ORAL | Status: DC | PRN
  Administered 2020-02-29 (×2): 10 mg via ORAL
  Administered 2020-03-01: 5 mg via ORAL
  Administered 2020-03-01 – 2020-03-05 (×16): 10 mg via ORAL
  Filled 2020-02-29 (×2): qty 2
  Filled 2020-02-29: qty 1
  Filled 2020-02-29 (×16): qty 2

## 2020-02-29 MED ORDER — ONDANSETRON HCL 4 MG/2ML IJ SOLN: 4.0000 mg | Freq: Four times a day (QID) | INTRAMUSCULAR | Status: DC | PRN

## 2020-02-29 MED ORDER — POTASSIUM CHLORIDE 20 MEQ/15ML (10%) PO SOLN
40.0000 meq | Freq: Two times a day (BID) | ORAL | Status: DC
  Administered 2020-02-29 – 2020-03-05 (×10): 40 meq via ORAL
  Filled 2020-02-29 (×10): qty 30

## 2020-02-29 MED ORDER — FERROUS SULFATE 325 (65 FE) MG PO TABS
325.0000 mg | ORAL_TABLET | Freq: Every day | ORAL | Status: DC
  Administered 2020-03-01 – 2020-03-05 (×5): 325 mg via ORAL
  Filled 2020-02-29 (×5): qty 1

## 2020-02-29 MED ORDER — ENOXAPARIN SODIUM 40 MG/0.4ML ~~LOC~~ SOLN
40.0000 mg | SUBCUTANEOUS | Status: DC
  Administered 2020-03-01 – 2020-03-05 (×5): 40 mg via SUBCUTANEOUS
  Filled 2020-02-29 (×6): qty 0.4

## 2020-02-29 MED ORDER — SODIUM CHLORIDE 0.9% FLUSH: 3.0000 mL | Freq: Once | INTRAVENOUS | Status: DC

## 2020-02-29 MED ORDER — POTASSIUM CHLORIDE 10 MEQ/100ML IV SOLN
10.0000 meq | INTRAVENOUS | Status: AC
  Administered 2020-02-29 (×3): 10 meq via INTRAVENOUS
  Filled 2020-02-29 (×3): qty 100

## 2020-02-29 MED ORDER — SODIUM CHLORIDE 0.9 % IV BOLUS
1000.0000 mL | Freq: Once | INTRAVENOUS | Status: AC
  Administered 2020-02-29: 1000 mL via INTRAVENOUS

## 2020-02-29 MED ORDER — DOCUSATE SODIUM 100 MG PO CAPS: 100.0000 mg | ORAL_CAPSULE | Freq: Two times a day (BID) | ORAL | Status: DC | PRN

## 2020-02-29 MED ORDER — IBUPROFEN 200 MG PO TABS: 200.0000 mg | ORAL_TABLET | ORAL | Status: DC | PRN

## 2020-02-29 MED ORDER — ROSUVASTATIN CALCIUM 20 MG PO TABS
20.0000 mg | ORAL_TABLET | Freq: Every day | ORAL | Status: DC
  Administered 2020-03-01 – 2020-03-05 (×5): 20 mg via ORAL
  Filled 2020-02-29 (×5): qty 1

## 2020-02-29 MED ORDER — SODIUM CHLORIDE 0.9 % IV SOLN: INTRAVENOUS | Status: AC

## 2020-02-29 MED ORDER — ENSURE ENLIVE PO LIQD
237.0000 mL | Freq: Three times a day (TID) | ORAL | Status: DC
  Administered 2020-03-01: 237 mL via ORAL
  Filled 2020-02-29: qty 237

## 2020-02-29 MED ORDER — ONDANSETRON HCL 4 MG PO TABS
4.0000 mg | ORAL_TABLET | Freq: Four times a day (QID) | ORAL | Status: DC | PRN
  Administered 2020-03-02: 4 mg via ORAL
  Filled 2020-02-29: qty 1

## 2020-02-29 MED ORDER — GABAPENTIN 100 MG PO CAPS
200.0000 mg | ORAL_CAPSULE | Freq: Three times a day (TID) | ORAL | Status: DC
  Administered 2020-02-29 – 2020-03-05 (×16): 200 mg via ORAL
  Filled 2020-02-29 (×16): qty 2

## 2020-02-29 NOTE — H&P (Addendum)
History and Physical  Samantha Gonzales SWN:[id number] DOB: [birth year] DOA: 02/29/2020  PCP: System, Pcp Not In Patient coming from: Home  I have personally briefly reviewed patient's old medical records in [place]   Chief Complaint: Generalized weakness and poor appetite  HPI: Samantha Gonzales is a 79 y.o. female with past medical history of a previous small bowel obstruction, discharged from the hospital on 02/03/2020 during this time on the CT scan she was found to have a 9.5 cm intra-abdominal fluid collection, radiologist was consulted percutaneous drain was placed on 01/30/2020 cultures grew E. coli and Enterococcus for which she has been on Bactrim and Zyvox which was eventually deescalated to Bactrim.  Drain was removed on 02/14/2020 CT scan back then showed no evidence of bowel fistula, acute kidney injury, severe protein caloric malnutrition and a sacral decubitus ulcer which has been cared for by her daughter, as per daughter state that she went to see her primary care doctor for normal checkup and she has lost since then about 50 pounds, was found to be weak and borderline hypotensive by her PCP and was sent to the ED, as per daughter she relates the mother has not been eating for the last several weeks despite Megace she eats about half a sandwich every day.  In the ED: She was found to be normotensive, afebrile with a white count of 10.1 and a left shift, mildly hypokalemic and with a rise in her creatinine.   Review of Systems: All systems reviewed and apart from history of presenting illness, are negative.  Past Medical History:  Diagnosis Date  . Hyperlipidemia   . Hypertension   . Syncope 10/27/2019   Past Surgical History:  Procedure Laterality Date  . CHOLECYSTECTOMY N/A 09/05/2019   Procedure: LAPAROSCOPIC CHOLECYSTECTOMY WITH INTRAOPERATIVE CHOLANGIOGRAM;  Surgeon: Erroll Luna, MD;  Location: [facility];  Service: General;  Laterality: N/A;  . ERCP N/A 09/07/2019     Procedure: ENDOSCOPIC RETROGRADE CHOLANGIOPANCREATOGRAPHY (ERCP);  Surgeon: Clarene Essex, MD;  Location: [facility];  Service: Endoscopy;  Laterality: N/A;  . IR RADIOLOGIST EVAL & MGMT  02/14/2020  . LAPAROTOMY N/A 01/12/2020   Procedure: EXPLORATORY LAPAROTOMY LYSIS OF ADHESIONS AND SMALL BOWEL OBSTRUCTION;  Surgeon: Erroll Luna, MD;  Location: [facility];  Service: General;  Laterality: N/A;  . PANCREATIC STENT PLACEMENT  09/07/2019   Procedure: PANCREATIC STENT PLACEMENT;  Surgeon: Clarene Essex, MD;  Location: [facility] ENDOSCOPY;  Service: Endoscopy;;  . REMOVAL OF STONES  09/07/2019   Procedure: REMOVAL OF STONES;  Surgeon: Clarene Essex, MD;  Location: [facility] ENDOSCOPY;  Service: Endoscopy;;  . [name]  09/07/2019   Procedure: [name];  Surgeon: Clarene Essex, MD;  Location: [place];  Service: Endoscopy;;  . TUBAL LIGATION     Social History:  reports that she has never smoked. Her smokeless tobacco use includes chew. She reports that she does not drink alcohol or use drugs.   Allergies  Allergen Reactions  . Penicillins Hives, Itching and Rash    Has patient had a PCN reaction causing immediate rash, facial/tongue/throat swelling, SOB or lightheadedness with hypotension: Yes Has patient had a PCN reaction causing severe rash involving mucus membranes or skin necrosis: yes Has patient had a PCN reaction that required hospitalization: unk Has patient had a PCN reaction occurring within the last 10 years: unk If all of the above answers are "NO", then may proceed with Cephalosporin use.     Family History  Problem Relation Age of  Onset  . Alcoholism Father   . CAD Neg Hx      Prior to Admission medications   Medication Sig Start Date End Date Taking? Authorizing Provider  acetaminophen (TYLENOL) 500 MG tablet Take 2 tablets (1,000 mg total) by mouth every 6 (six) hours. 02/02/20   Meuth, Brooke A, PA-C  amLODipine (NORVASC) 10 MG tablet Take 10 mg by mouth daily. 09/25/19    [provider]  collagenase (SANTYL) ointment Apply topically daily. 02/03/20   Meuth, Brooke A, PA-C  docusate sodium (COLACE) 100 MG capsule Take 1 capsule (100 mg total) by mouth 2 (two) times daily as needed for mild constipation. 01/19/20   Norm Parcel, PA-C  feeding supplement, ENSURE ENLIVE, (ENSURE ENLIVE) LIQD Take 237 mLs by mouth 3 (three) times daily between meals. 02/03/20   Meuth, Brooke A, PA-C  ferrous sulfate 325 (65 FE) MG tablet Take 1 tablet (325 mg total) by mouth daily with breakfast. 01/20/20   Norm Parcel, PA-C  folic acid (FOLVITE) 1 MG tablet Take 1 tablet (1 mg total) by mouth daily. 01/20/20   Dessa Phi, DO  gabapentin (NEURONTIN) 100 MG capsule Take 2 capsules (200 mg total) by mouth 3 (three) times daily. 01/23/20   Earnstine Regal, PA-C  ibuprofen (ADVIL) 200 MG tablet You can take 2 tablets every 6 hours as needed for pain.  Follow package directions.  You can buy this over-the-counter at any drugstore. 01/23/20   Earnstine Regal, PA-C  megestrol (MEGACE) 40 MG tablet Take 4 tablets (160 mg total) by mouth daily. 02/03/20 03/04/20  Meuth, Brooke A, PA-C  Multiple Vitamin (MULTIVITAMIN WITH MINERALS) TABS tablet Take 1 tablet by mouth daily. 01/19/20   Norm Parcel, PA-C  oxyCODONE 10 MG TABS Take 0.5-1 tablets (5-10 mg total) by mouth every 6 (six) hours as needed for severe pain. 02/02/20   Meuth, Brooke A, PA-C  polyethylene glycol (MIRALAX / GLYCOLAX) 17 g packet Take 17 g by mouth daily as needed for mild constipation. 02/02/20   Meuth, Brooke A, PA-C  rosuvastatin (CRESTOR) 20 MG tablet Take 20 mg by mouth daily. 07/30/18   [provider]  sodium chloride flush (NS) 0.9 % SOLN 5 mLs by Intracatheter route daily for 28 days. 02/03/20 03/02/20  Meuth, Brooke A, PA-C  sulfamethoxazole-trimethoprim (BACTRIM DS) 800-160 MG tablet Take 1 tablet by mouth every 12 (twelve) hours. 02/02/20   Wellington Hampshire, PA-C   Physical Exam: Vitals:   02/29/20 1022  02/29/20 1200 02/29/20 1500 02/29/20 1615  BP: (!) 171/72 139/72 (!) 150/72 (!) 159/82  Pulse: 96   78  Resp: 15   11  Temp: 98.2 F (36.8 C)     TempSrc: Oral     SpO2: 100%   100%  Weight: 50.3 kg     Height: '6\' 1"'$  (1.854 m)        General exam: Moderately built cachectic  Head, eyes and ENT: Nontraumatic and normocephalic.  Dry mucous membrane  Neck: Supple. No JVD, carotid bruit or thyromegaly.  Lymphatics: No lymphadenopathy.  Respiratory system: Clear to auscultation. No increased work of breathing.  Cardiovascular system: S1 and S2 heard, RRR. No JVD, murmurs, gallops, clicks or pedal edema.  Gastrointestinal system: Abdomen is nondistended, soft and nontender. Normal bowel sounds heard. No organomegaly or masses appreciated.  Central nervous system: Alert and oriented. No focal neurological deficits.  Extremities: Symmetric 5 x 5 power. Peripheral pulses symmetrically felt.   Skin: Sacral decubitus ulcer  about 2 x 4 cm with no purulent drainage, and an open wound in her abdomen.  Musculoskeletal system: Negative exam.  Psychiatry: Pleasant and cooperative.   Labs on Admission:  Basic Metabolic Panel: Recent Labs  Lab 02/29/20 1036  NA 142  K 2.4*  CL 108  CO2 20*  GLUCOSE 87  BUN 10  CREATININE 0.90  CALCIUM 9.4   Liver Function Tests: Recent Labs  Lab 02/29/20 1036  AST 15  ALT 12  ALKPHOS 69  BILITOT 0.6  PROT 5.9*  ALBUMIN 2.9*   Recent Labs  Lab 02/29/20 1124  LIPASE 21   No results for input(s): AMMONIA in the last 168 hours. CBC: Recent Labs  Lab 02/29/20 1036  WBC 10.6*  NEUTROABS 9.0*  HGB 11.7*  HCT 35.6*  MCV 104.1*  PLT 309   Cardiac Enzymes: No results for input(s): CKTOTAL, CKMB, CKMBINDEX, TROPONINI in the last 168 hours.  BNP (last 3 results) No results for input(s): PROBNP in the last 8760 hours. CBG: No results for input(s): GLUCAP in the last 168 hours.  Radiological Exams on Admission: No results  found.  EKG: Independently reviewed: none   Assessment/Plan AKI (acute kidney injury) [facility]): Her baseline creatinine is around 0.4. Likely prerenal azotemia in the setting of significant decreased oral intake and ibuprofen use. We will discontinue ibuprofen start on IV fluids and recheck a basic metabolic panel in the morning.  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 We will consult wound care, I believe her sacral decubitus ulcer is currently not infected, CRP is not elevated ESR is just mildly elevated. We will check an abdominal x-ray as the patient has mild abdominal pain question constipation.  Hypokalemia: Replete orally recheck in the morning likely due to decreased oral intake.  Essential hypertension: Blood pressure seems to be at goal continue current home regimen from home.  Protein-calorie malnutrition, severe: Consult nutrition continue Ensure 3 times daily.  Macrocytic anemia: Check an anemia panel her RDW is mildly elevated which can point towards a mixed picture. Continue ferrous sulfate at home dose, her hemoglobin seems to be at baseline compared to previous, her previous hemoglobin is around 9, on admission is 11 likely pointing toward hemoconcentration I expect the hemodelusional effect back to her baseline after IV fluid hydration, the daughter denies any signs of overt bleeding.  Goals of care: We will consult Palliative care to address goals of care with daughter, as obviously the daughter cannot take care of the patient at home she will probably need skilled nursing facility placement.  Also consult transitions of care team. Consult physical therapy and Occupational Therapy as she is obviously not ambulating at home and just laying on her left side which is probably why she has developed the sacral decubitus ulcer.  DVT  Prophylaxis: Lovenox Code Status: Full Family Communication: Daughter Disposition Plan: Inpatient  It is my clinical opinion that admission to INPATIENT is reasonable and necessary in this 79 y.o. female multiple comorbidities including hypertension sacral decubitus ulcer and a recent pelvic abscess with a recent small bowel obstruction new acute renal failure failure to thrive which daughter can probably not take care of her at home we will start on aggressive IV fluid hydration, consult nutrition started on Ensure 3 times daily consult palliative care to address goals of care for this patient.  It appears that she is  deteriorating significantly over the last several months she will need oral and IV narcotics for pain control as we will need to try to avoid NSAIDs due to her new acute renal failure.  Given the aforementioned, the predictability of an adverse outcome is felt to be significant. I expect that the patient will require at least 2 midnights in the hospital to treat this condition.  Charlynne Cousins MD [facility]   02/29/2020, 5:15 PM

## 2020-02-29 NOTE — ED Notes (Signed)
Pt given pain med very munhappy  shwe has called on her button x 3 in 15 minutes

## 2020-02-29 NOTE — ED Triage Notes (Signed)
Pt bib daughter from MD office for evaluation of dehydration, malnutrition and a sore on her bottom.

## 2020-02-29 NOTE — ED Notes (Signed)
Pt askiong for more p[ain med   Her next time to get med is 0200am  She was given that information

## 2020-02-29 NOTE — ED Notes (Signed)
Pt consistently saying that she hasn't gotten any pain medication. Pain medication given by this nurse 2x times.

## 2020-02-29 NOTE — ED Notes (Signed)
Pt also reporting that no one has seen her backside. This nurse confirmed with the provider that she has indeed been seen.

## 2020-02-29 NOTE — ED Provider Notes (Signed)
[facility] EMERGENCY DEPARTMENT Provider Note   CSN: [id number] Arrival date & time: 02/29/20  1010     History No chief complaint on file.   Samantha Gonzales is a 79 y.o. female. With past medical history of protein calorie malnutrition, previous small bowel obstruction, AKI, hyper hypertension who presents for weakness and failure to thrive.  History is gathered from the patient, review of EMR and by phone with her daughter.  Her daughter states that they went to go see her primary care physician Dr. Melford Aase for normal checkup.  She has not seen him in about a year due to the Covid pandemic.  Since that time he noted that she has had a 50 pound weight loss.  Back in April of this year the patient had an exploratory laparotomy due to small bowel obstruction from adhesions.  She has a history of previous abdominal surgeries.  She ended up developing an intrapelvic abscess and had a drain placed through the left buttock into the pelvis.  She had good resolution of the abscess and had the drain removed.  She is noted to have a large sacral decubitus ulcer.  She states it has been extremely painful and will not heal.  Her daughter has been doing wound management at home.  She was sent here for hypotension, weakness and wound management in the setting of failure to thrive by her PCP.  Her daughter states that she barely eats.  She has been placed on Megace but is not seeming to improve her appetite.  Her daughter states she might eat about half of a sandwich every 2 days.  The patient is under pain management contract is on 10 mg oxycodone every 6 hours states that it is not helping with her severe sacral region pain.         Past Medical History:  Diagnosis Date  . Hyperlipidemia   . Hypertension   . Syncope 10/27/2019    Patient Active Problem List   Diagnosis Date Noted  . 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 Protein-calorie malnutrition, severe 01/19/2020  . Murmur,  heart 01/11/2020  . SBO (small bowel obstruction) ([place]) 01/11/2020  . Small bowel obstruction ([place]) 12/29/2019  . Pressure injury of skin 10/29/2019  . Colitis 10/28/2019  . AKI (acute kidney injury) ([place]) 10/27/2019  . Syncope 10/27/2019  . Hypokalemia 10/27/2019  . Essential hypertension 10/27/2019  . Malnutrition of moderate degree 09/07/2019  . Cholecystitis 09/04/2019  . Altered mental status 01/18/2018  . Altered mental state 01/17/2018    Past Surgical History:  Procedure Laterality Date  . CHOLECYSTECTOMY N/A 09/05/2019   Procedure: LAPAROSCOPIC CHOLECYSTECTOMY WITH INTRAOPERATIVE CHOLANGIOGRAM;  Surgeon: Erroll Luna, MD;  Location: [facility];  Service: General;  Laterality: N/A;  . ERCP N/A 09/07/2019   Procedure: ENDOSCOPIC RETROGRADE CHOLANGIOPANCREATOGRAPHY (ERCP);  Surgeon: Clarene Essex, MD;  Location: [facility];  Service: Endoscopy;  Laterality: N/A;  . IR RADIOLOGIST EVAL & MGMT  02/14/2020  . LAPAROTOMY N/A 01/12/2020   Procedure: EXPLORATORY LAPAROTOMY LYSIS OF ADHESIONS AND SMALL BOWEL OBSTRUCTION;  Surgeon: Erroll Luna, MD;  Location: [facility];  Service: General;  Laterality: N/A;  . PANCREATIC STENT PLACEMENT  09/07/2019   Procedure: PANCREATIC STENT PLACEMENT;  Surgeon: Clarene Essex, MD;  Location: [place] [facility];  Service: Endoscopy;;  . REMOVAL OF STONES  09/07/2019   Procedure: REMOVAL OF STONES;  Surgeon: Clarene Essex, MD;  Location: [facility] ENDOSCOPY;  Service: Endoscopy;;  . SPHINCTEROTOMY  09/07/2019   Procedure:  SPHINCTEROTOMY;  Surgeon: Vida Rigger, MD;  Location[facility] ENDOSCOPY;  Service: Endoscopy;;  . TUBAL LIGATION       OB History   No obstetric history on file.     Family History  Problem Relation Age of Onset  . CAD Neg Hx     Social History   Tobacco Use  . Smoking status: Never Smoker  . Smokeless tobacco: Current User    Types: Chew  Substance Use Topics  . Alcohol use: No  . Drug use: No    Home Medications Prior to Admission  medications   Medication Sig Start Date End Date Taking? Authorizing Provider  acetaminophen (TYLENOL) 500 MG tablet Take 2 tablets (1,000 mg total) by mouth every 6 (six) hours. 02/02/20   Meuth, Brooke A, PA-C  amLODipine (NORVASC) 10 MG tablet Take 10 mg by mouth daily. 09/25/19   [provider]  collagenase (SANTYL) ointment Apply topically daily. 02/03/20   Meuth, Brooke A, PA-C  docusate sodium (COLACE) 100 MG capsule Take 1 capsule (100 mg total) by mouth 2 (two) times daily as needed for mild constipation. 01/19/20   Juliet Rude, PA-C  feeding supplement, ENSURE ENLIVE, (ENSURE ENLIVE) LIQD Take 237 mLs by mouth 3 (three) times daily between meals. 02/03/20   Meuth, Brooke A, PA-C  ferrous sulfate 325 (65 FE) MG tablet Take 1 tablet (325 mg total) by mouth daily with breakfast. 01/20/20   Juliet Rude, PA-C  folic acid (FOLVITE) 1 MG tablet Take 1 tablet (1 mg total) by mouth daily. 01/20/20   Noralee Stain, DO  gabapentin (NEURONTIN) 100 MG capsule Take 2 capsules (200 mg total) by mouth 3 (three) times daily. 01/23/20   Sherrie George, PA-C  ibuprofen (ADVIL) 200 MG tablet You can take 2 tablets every 6 hours as needed for pain.  Follow package directions.  You can buy this over-the-counter at any drugstore. 01/23/20   Sherrie George, PA-C  megestrol (MEGACE) 40 MG tablet Take 4 tablets (160 mg total) by mouth daily. 02/03/20 03/04/20  Meuth, Brooke A, PA-C  Multiple Vitamin (MULTIVITAMIN WITH MINERALS) TABS tablet Take 1 tablet by mouth daily. 01/19/20   Juliet Rude, PA-C  oxyCODONE 10 MG TABS Take 0.5-1 tablets (5-10 mg total) by mouth every 6 (six) hours as needed for severe pain. 02/02/20   Meuth, Brooke A, PA-C  polyethylene glycol (MIRALAX / GLYCOLAX) 17 g packet Take 17 g by mouth daily as needed for mild constipation. 02/02/20   Meuth, Brooke A, PA-C  rosuvastatin (CRESTOR) 20 MG tablet Take 20 mg by mouth daily. 07/30/18   [provider]  sodium chloride flush  (NS) 0.9 % SOLN 5 mLs by Intracatheter route daily for 28 days. 02/03/20 03/02/20  Meuth, Brooke A, PA-C  sulfamethoxazole-trimethoprim (BACTRIM DS) 800-160 MG tablet Take 1 tablet by mouth every 12 (twelve) hours. 02/02/20   Meuth, Lina Sar, PA-C    Allergies    Penicillins  Review of Systems   Review of Systems Ten systems reviewed and are negative for acute change, except as noted in the HPI.   Physical Exam Updated Vital Signs BP (!) 171/72 (BP Location: Left Arm)   Pulse 96   Temp 98.2 F (36.8 C) (Oral)   Resp 15   Ht 6\' 1"  (1.854 m)   Wt 50.3 kg   SpO2 100%   BMI 14.64 kg/m   Physical Exam Vitals and nursing note reviewed.  Constitutional:      General: She  is not in acute distress.    Appearance: She is well-developed. She is cachectic. She is not diaphoretic.  HENT:     Head: Normocephalic and atraumatic.  Eyes:     General: No scleral icterus.    Conjunctiva/sclera: Conjunctivae normal.  Cardiovascular:     Rate and Rhythm: Normal rate and regular rhythm.     Heart sounds: Normal heart sounds. No murmur. No friction rub. No gallop.   Pulmonary:     Effort: Pulmonary effort is normal. No respiratory distress.     Breath sounds: Normal breath sounds.  Abdominal:     General: Bowel sounds are normal. There is no distension.     Palpations: Abdomen is soft. There is no mass.     Tenderness: There is no abdominal tenderness. There is no guarding.  Musculoskeletal:     Cervical back: Normal range of motion.  Skin:    General: Skin is warm and dry.     Comments: There is a stage III ulcer with white necrotic tissue centrally over the left side of the patient's sacrum  Neurological:     Mental Status: She is alert and oriented to person, place, and time.  Psychiatric:        Behavior: Behavior normal.      ED Results / Procedures / Treatments   Labs (all labs ordered are listed, but only abnormal results are displayed) Labs Reviewed  COMPREHENSIVE METABOLIC  PANEL - Abnormal; Notable for the following components:      Result Value   Potassium 2.4 (*)    CO2 20 (*)    Total Protein 5.9 (*)    Albumin 2.9 (*)    All other components within normal limits  CBC WITH DIFFERENTIAL/PLATELET - Abnormal; Notable for the following components:   WBC 10.6 (*)    RBC 3.42 (*)    Hemoglobin 11.7 (*)    HCT 35.6 (*)    MCV 104.1 (*)    MCH 34.2 (*)    RDW 18.6 (*)    Neutro Abs 9.0 (*)    All other components within normal limits  SEDIMENTATION RATE - Abnormal; Notable for the following components:   Sed Rate 25 (*)    All other components within normal limits  LIPASE, BLOOD  C-REACTIVE PROTEIN    EKG None  Radiology No results found.  Procedures Procedures (including critical care time)  Medications Ordered in ED Medications  sodium chloride flush (NS) 0.9 % injection 3 mL (3 mLs Intravenous Not Given 02/29/20 1148)  potassium chloride 10 mEq in 100 mL IVPB (10 mEq Intravenous New Bag/Given 02/29/20 1555)  oxyCODONE (Oxy IR/ROXICODONE) immediate release tablet 5-10 mg (10 mg Oral Given 02/29/20 1337)  morphine 4 MG/ML injection 4 mg (4 mg Intravenous Given 02/29/20 1212)  ondansetron (ZOFRAN) injection 4 mg (4 mg Intravenous Given 02/29/20 1210)  sodium chloride 0.9 % bolus 1,000 mL (0 mLs Intravenous Stopped 02/29/20 1440)    ED Course  I have reviewed the triage vital signs and the nursing notes.  Pertinent labs & imaging results that were available during my care of the patient were reviewed by me and considered in my medical decision making (see chart for details).  Clinical Course as of Feb 28 1434  Wed Feb 29, 2020  1433 MCV(!): 104.1 [AH]    Clinical Course User Index [AH] Arthor Captain, PA-C   MDM Rules/Calculators/A&P  MDM Number of Diagnoses or Management Options   Amount and/or Complexity of Data Reviewed Clinical lab tests: ordered and reviewed Discussion of test results with the performing providers:  yes Obtain history from someone other than the patient: yes Review and summarize past medical records: yes Discuss the patient with other providers: yes  Risk of Complications, Morbidity, and/or Mortality Presenting problems: high   79 year old female here with failure to thrive. Patient has a mild leukocytosis of insignificant value.  Patient has a macrocytic anemia, hypokalemia and low protein levels I suggestive of significant malnutrition.  Patient does have a large sacral decubitus wound however does not appear infected.  Patient's sed rate and CRP are rather unremarkable I doubt this is a case of osteomyelitis and I have not ordered any imaging.  Patient Covid test is pending.  I discussed the case with Dr. Rosine Beat who will admit the patient.  Final Clinical Impression(s) / ED Diagnoses Final diagnoses:  Failure to thrive in adult  Severe protein-calorie malnutrition (HCC)  Macrocytic anemia  Hypokalemia    Rx / DC Orders ED Discharge Orders    None       Arthor Captain, PA-C 02/29/20 1613    Sabas Sous, MD 03/02/20 1531

## 2020-03-01 ENCOUNTER — Encounter (HOSPITAL_COMMUNITY): Payer: Self-pay | Admitting: Internal Medicine

## 2020-03-01 DIAGNOSIS — Z515 Encounter for palliative care: Secondary | ICD-10-CM

## 2020-03-01 DIAGNOSIS — R627 Adult failure to thrive: Secondary | ICD-10-CM

## 2020-03-01 DIAGNOSIS — L89150 Pressure ulcer of sacral region, unstageable: Secondary | ICD-10-CM

## 2020-03-01 DIAGNOSIS — Z7189 Other specified counseling: Secondary | ICD-10-CM

## 2020-03-01 LAB — BASIC METABOLIC PANEL
Anion gap: 13 (ref 5–15)
BUN: 10 mg/dL (ref 8–23)
CO2: 21 mmol/L — ABNORMAL LOW (ref 22–32)
Calcium: 8.9 mg/dL (ref 8.9–10.3)
Chloride: 107 mmol/L (ref 98–111)
Creatinine, Ser: 0.78 mg/dL (ref 0.44–1.00)
GFR calc Af Amer: >60 mL/min (ref >60)
GFR calc non Af Amer: >60 mL/min (ref >60)
Glucose, Bld: 82 mg/dL (ref 70–99)
Potassium: 3.1 mmol/L — ABNORMAL LOW (ref 3.5–5.1)
Sodium: 141 mmol/L (ref 135–145)

## 2020-03-01 LAB — CBC
HCT: 30.8 % — ABNORMAL LOW (ref 36.0–46.0)
Hemoglobin: 10.2 g/dL — ABNORMAL LOW (ref 12.0–15.0)
MCH: 34.1 pg — ABNORMAL HIGH (ref 26.0–34.0)
MCHC: 33.1 g/dL (ref 30.0–36.0)
MCV: 103 fL — ABNORMAL HIGH (ref 80.0–100.0)
Platelets: 276 10*3/uL (ref 150–400)
RBC: 2.99 MIL/uL — ABNORMAL LOW (ref 3.87–5.11)
RDW: 19 % — ABNORMAL HIGH (ref 11.5–15.5)
WBC: 9.9 10*3/uL (ref 4.0–10.5)
nRBC: 0 % (ref 0.0–0.2)

## 2020-03-01 MED ORDER — PRO-STAT SUGAR FREE PO LIQD
30.0000 mL | Freq: Three times a day (TID) | ORAL | Status: DC
  Administered 2020-03-01 – 2020-03-05 (×11): 30 mL via ORAL
  Filled 2020-03-01 (×11): qty 30

## 2020-03-01 MED ORDER — FENTANYL CITRATE (PF) 100 MCG/2ML IJ SOLN
25.0000 ug | Freq: Once | INTRAMUSCULAR | Status: AC
  Administered 2020-03-01: 25 ug via INTRAVENOUS
  Filled 2020-03-01: qty 2

## 2020-03-01 MED ORDER — PRO-STAT SUGAR FREE PO LIQD: 30.0000 mL | Freq: Two times a day (BID) | ORAL | Status: DC

## 2020-03-01 MED ORDER — ADULT MULTIVITAMIN W/MINERALS CH
1.0000 | ORAL_TABLET | Freq: Every day | ORAL | Status: DC
  Administered 2020-03-02 – 2020-03-05 (×4): 1 via ORAL
  Filled 2020-03-01 (×4): qty 1

## 2020-03-01 MED ORDER — TRAMADOL HCL 50 MG PO TABS
50.0000 mg | ORAL_TABLET | Freq: Four times a day (QID) | ORAL | Status: DC | PRN
  Administered 2020-03-02 – 2020-03-05 (×10): 50 mg via ORAL
  Filled 2020-03-01 (×10): qty 1

## 2020-03-01 MED ORDER — COLLAGENASE 250 UNIT/GM EX OINT
TOPICAL_OINTMENT | Freq: Every day | CUTANEOUS | Status: DC
  Filled 2020-03-01 (×2): qty 30

## 2020-03-01 NOTE — [place] FL2 (Signed)
[place] MEDICAID FL2 LEVEL OF CARE SCREENING TOOL     IDENTIFICATION  Patient Name: Samantha Gonzales Birthdate: 1941/06/14 Sex: female Admission Date (Current Location): 02/29/2020  [facility] and [place] Number:  Herbalist and Address:  [facility], [place] [address] [place], [place] [address]      Provider Number: [id number]  Attending Physician Name and Address:  Aline August, MD  Relative Name and Phone Number:       Current Level of Care: Hospital Recommended Level of Care: [place] Prior Approval Number:    Date Approved/Denied:   PASRR Number: [id number]  Discharge Plan: SNF    Current Diagnoses: Patient Active Problem List   Diagnosis Date Noted  . Macrocytic anemia 02/29/2020  . 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 Protein-calorie malnutrition, severe 01/19/2020  . Murmur, heart 01/11/2020  . SBO (small bowel obstruction) ([place]) 01/11/2020  . Small bowel obstruction ([place]) 12/29/2019  . Pressure injury of skin 10/29/2019  . Colitis 10/28/2019  . AKI (acute kidney injury) ([place]) 10/27/2019  . Syncope 10/27/2019  . Hypokalemia 10/27/2019  . Essential hypertension 10/27/2019  . Malnutrition of moderate degree 09/07/2019  . Cholecystitis 09/04/2019  . Altered mental status 01/18/2018  . Altered mental state 01/17/2018    Orientation RESPIRATION BLADDER Height & Weight     Self, Time, Situation, Place  Normal Incontinent Weight: 111 lb (50.3 kg) Height:  6\' 1"  (185.4 cm)  BEHAVIORAL SYMPTOMS/MOOD NEUROLOGICAL BOWEL NUTRITION STATUS      Incontinent Diet(see discharge summary)  AMBULATORY STATUS COMMUNICATION OF NEEDS Skin   Extensive Assist Verbally PU Stage and Appropriate Care(stage 4 PU on coccyx)       PU Stage 4 Dressing: (foam dressing PRN)               Personal Care Assistance Level of Assistance  Bathing, Feeding, Dressing Bathing Assistance: Maximum assistance Feeding assistance:  Limited assistance Dressing Assistance: Maximum assistance     Functional Limitations Info  Sight, Speech, Hearing Sight Info: Adequate Hearing Info: Adequate Speech Info: Adequate    SPECIAL CARE FACTORS FREQUENCY  OT (By licensed OT), PT (By licensed PT)     PT Frequency: 5x week OT Frequency: 5x week            Contractures Contractures Info: Not present    Additional Factors Info  Code Status, Allergies Code Status Info: Full Code Allergies Info: Penicillins           Current Medications (03/01/2020):  This is the current hospital active medication list Current Facility-Administered Medications  Medication Dose Route Frequency Provider Last Rate Last Admin  . 0.9 %  sodium chloride infusion   Intravenous Continuous Charlynne Cousins, MD 75 mL/hr at 03/01/20 0456 Rate Change at 03/01/20 0456  . 0.9 %  sodium chloride infusion   Intravenous Continuous Charlynne Cousins, MD 75 mL/hr at 03/01/20 5809 Rate Verify at 03/01/20 9833  . amLODipine (NORVASC) tablet 10 mg  10 mg Oral Daily Charlynne Cousins, MD   10 mg at 03/01/20 1008  . collagenase (SANTYL) ointment   Topical Daily Alekh, Kshitiz, MD      . docusate sodium (COLACE) capsule 100 mg  100 mg Oral BID PRN Charlynne Cousins, MD      . enoxaparin (LOVENOX) injection 40 mg  40 mg Subcutaneous Q24H Charlynne Cousins, MD   40 mg at 03/01/20 1009  . feeding supplement (ENSURE ENLIVE) (ENSURE ENLIVE)  liquid 237 mL  237 mL Oral TID BM Marinda Elk, MD   237 mL at 03/01/20 1009  . ferrous sulfate tablet 325 mg  325 mg Oral Q breakfast Marinda Elk, MD   325 mg at 03/01/20 1007  . folic acid (FOLVITE) tablet 1 mg  1 mg Oral Daily Marinda Elk, MD   1 mg at 03/01/20 1008  . gabapentin (NEURONTIN) capsule 200 mg  200 mg Oral TID Marinda Elk, MD   200 mg at 03/01/20 1008  . megestrol (MEGACE) tablet 160 mg  160 mg Oral Daily Marinda Elk, MD   160 mg at 02/29/20 1857  .  ondansetron (ZOFRAN) tablet 4 mg  4 mg Oral Q6H PRN Marinda Elk, MD       Or  . ondansetron [facility]) injection 4 mg  4 mg Intravenous Q6H PRN Marinda Elk, MD      . oxyCODONE (Oxy IR/ROXICODONE) immediate release tablet 5-10 mg  5-10 mg Oral Q6H PRN Marinda Elk, MD   5 mg at 03/01/20 1008  . polyethylene glycol (MIRALAX / GLYCOLAX) packet 17 g  17 g Oral Daily PRN Marinda Elk, MD      . potassium chloride 20 MEQ/15ML (10%) solution 40 mEq  40 mEq Oral BID Marinda Elk, MD   40 mEq at 03/01/20 1009  . rosuvastatin (CRESTOR) tablet 20 mg  20 mg Oral Daily Marinda Elk, MD   20 mg at 03/01/20 1008  . sodium chloride flush (NS) 0.9 % injection 3 mL  3 mL Intravenous Once David Stall, Darin Engels, MD      . sulfamethoxazole-trimethoprim (BACTRIM DS) 800-160 MG per tablet 1 tablet  1 tablet Oral Q12H Marinda Elk, MD   1 tablet at 03/01/20 1009     Discharge Medications: Please see discharge summary for a list of discharge medications.  Relevant Imaging Results:  Relevant Lab Results:   Additional Information S#[id number] [place], [place]

## 2020-03-01 NOTE — Social Work (Addendum)
Acknowledging SNF recs, await PMT recs for GOC. Will complete fl2 at this time. Tentative prior auth ref #[id number]  Octavio Graves, MSW, LCSW [facility]

## 2020-03-01 NOTE — ED Notes (Signed)
Report called to the charge nurse on 6n

## 2020-03-01 NOTE — Consult Note (Signed)
WOC Nurse Consult Note: Patient receiving care in [facility] 6N14. Reason for Consult: sacral wound Wound type: unstageable wound to sacrum/coccyx area, and a superficial wound to the healed mid-abdominal incision Pressure Injury POA: Yes Measurement: sacral wound measures 5.8 cm x 2.3 cm x 2.6 cm with circumferential undermining, the greatest of which is at 7 o'clock and measures 2.9 cm.  The wound base is densely adherent, slick, thick yellow slough.  There is also a mid-abdomen superficial wound along an incision line that measures 3 cm x 0.7 cm and is 100% pink and just needs to re-epithelialize Wound bed: Drainage (amount, consistency, odor) no drainage, no odor from either wound detected. Periwound: intact for both Dressing procedure/placement/frequency: Santyl enzymatic debridement agent for sacral wound; foam dressing for abdominal wound. I have also added an air mattress for pressure relief. Monitor the wound area(s) for worsening of condition such as: Signs/symptoms of infection,  Increase in size,  Development of or worsening of odor, Development of pain, or increased pain at the affected locations.  Notify the medical team if any of these develop.  Thank you for the consult.  Discussed plan of care with the patient and bedside nurse.  WOC nurse will not follow at this time.  Please re-consult the WOC team if needed.  Helmut Muster, RN, MSN, CWOCN, CNS-BC, pager [phone]

## 2020-03-01 NOTE — Evaluation (Addendum)
Occupational Therapy Evaluation Patient Details Name: Samantha Gonzales MRN: [id number] DOB: Jan 17, 1941 Today's Date: 03/01/2020    History of Present Illness Pt is a 79 y.o. female with recent admission (d/c 02/03/20) for intraabdominal fluid collection and drain placement and removal 02/14/20, now readmitted 02/29/20 with weight loss, decreased oral intake and weakness. Workup for AKI, hypokelamia, malnutrition and anemia. Pt also with sacral ulcer stage IV.  Other PMH includes HTN, syncope.   Clinical Impression   Patient admitted for above and limited by problem list below, including generalized weakness, decreased activity tolerance, impaired balance and impaired cognition.  She reports completing ADLs and mobility independently prior to admission, but reports "I haven't been doing it as much as I should be" in regards to bathing. Questionable historian. She currently requires min assist for transfers and mobility (with several LOB requiring mod assist to correct), min -mod assist for ADLs.  She is pleasantly confused, follows commands inconsistently, poor awareness of safety/deficits and decreased problem solving.  Patient will benefit from continued OT services while admitted and after dc at SNF level to optimize independence and safety with ADls, mobility.    Follow Up Recommendations  SNF;Supervision/Assistance - 24 hour    Equipment Recommendations  3 in 1 bedside commode    Recommendations for Other Services       Precautions / Restrictions Precautions Precautions: Fall Restrictions Weight Bearing Restrictions: No      Mobility Bed Mobility Overal bed mobility: Needs Assistance Bed Mobility: Supine to Sit     Supine to sit: Min guard        Transfers Overall transfer level: Needs assistance Equipment used: 1 person hand held assist Transfers: Sit to/from [employer] Sit to Stand: Min assist Stand pivot transfers: Min assist       General transfer  comment: Reliant on HHA and minA to stand and maintain balance with pivot to [facility]    Balance Overall balance assessment: Needs assistance Sitting-balance support: No upper extremity supported;Feet supported Sitting balance-Leahy Scale: Fair     Standing balance support: No upper extremity supported;During functional activity;Single extremity supported Standing balance-Leahy Scale: Poor Standing balance comment: LOB with static standing                           ADL either performed or assessed with clinical judgement   ADL Overall ADL's : Needs assistance/impaired     Grooming: Set up;Sitting   Upper Body Bathing: Set up;Sitting   Lower Body Bathing: Minimal assistance;Sit to/from stand   Upper Body Dressing : Set up;Sitting   Lower Body Dressing: Moderate assistance;Sit to/from stand   Toilet Transfer: Minimal assistance;Stand-pivot;Ambulation;BSC   Toileting- Clothing Manipulation and Hygiene: Minimal assistance;Sit to/from stand       Functional mobility during ADLs: Minimal assistance;Cueing for safety General ADL Comments: pt limited by cognition, impaired balance, generalized weakness      Vision         Perception     Praxis      Pertinent Vitals/Pain Pain Assessment: Faces Faces Pain Scale: Hurts a little bit Pain Location: Sacral wound Pain Descriptors / Indicators: Grimacing;Guarding;Discomfort Pain Intervention(s): Monitored during session;Repositioned     Hand Dominance     Extremity/Trunk Assessment Upper Extremity Assessment Upper Extremity Assessment: Generalized weakness   Lower Extremity Assessment Lower Extremity Assessment: Generalized weakness   Cervical / Trunk Assessment Cervical / Trunk Assessment: Kyphotic   Communication Communication Communication: No difficulties   Cognition Arousal/Alertness:  Awake/alert Behavior During Therapy: WFL for tasks assessed/performed;Flat affect Overall Cognitive Status: No  family/caregiver present to determine baseline cognitive functioning Area of Impairment: Orientation;Attention;Memory;Following commands;Safety/judgement;Awareness;Problem solving                 Orientation Level: Disoriented to;Situation(requires choices for time ) Current Attention Level: Sustained Memory: Decreased short-term memory Following Commands: Follows one step commands inconsistently Safety/Judgement: Decreased awareness of safety;Decreased awareness of deficits Awareness: Intellectual Problem Solving: Requires verbal cues General Comments: patient with poor awareness to safety/deficits, pleasantly confused   General Comments  Difficulty opening small objects on breakfast tray and dialing phone, quick to ask for PT's assist, required encouragement to perform as much as independently as possible. Helped pt call daughter but no answer    Exercises     Shoulder Instructions      Home Living Family/patient expects to be discharged to:: Private residence Living Arrangements: Children Available Help at Discharge: Family;Available PRN/intermittently Type of Home: House Home Access: Stairs to enter [employer] of Steps: 3 Entrance Stairs-Rails: Right Home Layout: One level     Bathroom Shower/Tub: Chief Strategy Officer: Standard     Home Equipment: Environmental consultant - 2 wheels   Additional Comments: "My daughter is never there"      Prior Functioning/Environment Level of Independence: Needs assistance  Gait / Transfers Assistance Needed: Pt reports independent ambulation with device, denies falls; unsure reliable historian ADL's / Homemaking Assistance Needed: Pt reports independent with bathing, although states, "I haven't been doing it as much as I should have"   Comments: limited historian        OT Problem List: Decreased strength;Decreased activity tolerance;Impaired balance (sitting and/or standing);Decreased cognition;Decreased safety  awareness;Decreased knowledge of use of DME or AE;Decreased knowledge of precautions;Pain      OT Treatment/Interventions: Self-care/ADL training;DME and/or AE instruction;Therapeutic activities;Cognitive remediation/compensation;Patient/family education;Balance training;Therapeutic exercise    OT Goals(Current goals can be found in the care plan section) Acute Rehab OT Goals Patient Stated Goal: Initially declining SNF-level therapies, stating, "Can I go home today?" Time For Goal Achievement: 03/15/20 Potential to Achieve Goals: Good  OT Frequency: Min 2X/week   Barriers to D/C:            Co-evaluation PT/OT/SLP Co-Evaluation/Treatment: Yes Reason for Co-Treatment: Necessary to address cognition/behavior during functional activity;For patient/therapist safety;To address functional/ADL transfers PT goals addressed during session: Mobility/safety with mobility;Balance OT goals addressed during session: ADL's and self-care      AM-PAC OT "6 Clicks" Daily Activity     Outcome Measure Help from another person eating meals?: A Little Help from another person taking care of personal grooming?: A Little Help from another person toileting, which includes using toliet, bedpan, or urinal?: A Little Help from another person bathing (including washing, rinsing, drying)?: A Lot Help from another person to put on and taking off regular upper body clothing?: A Little Help from another person to put on and taking off regular lower body clothing?: A Lot 6 Click Score: 16   End of Session Nurse Communication: Mobility status;Precautions  Activity Tolerance: Patient tolerated treatment well Patient left: in chair;with call bell/phone within reach;with chair alarm set  OT Visit Diagnosis: Other abnormalities of gait and mobility (R26.89);Muscle weakness (generalized) (M62.81);Pain;Other symptoms and signs involving cognitive function Pain - part of body: (sacral wound)                Time:  4098-1191 OT Time Calculation (min): 17 min Charges:  OT General Charges $OT  Visit: 1 Visit OT Evaluation $OT Eval Moderate Complexity: 1 Mod  Jolaine Artist, OT Acute Rehabilitation Services Pager [phone] Office [phone]   Delight Stare 03/01/2020, 10:48 AM

## 2020-03-01 NOTE — Progress Notes (Signed)
Bed exchanged to one with an air mattress due to patient's fragile sacral area and decubiti.

## 2020-03-01 NOTE — Consult Note (Signed)
Consultation Note Date: 03/01/2020   Patient Name: Samantha Gonzales  DOB: 11-30-1940  MRN: [id number]  Age / Sex: 79 y.o., female  PCP: System, [place] Not In Referring Physician: Aline August, MD  Reason for Consultation: Establishing goals of care and Psychosocial/spiritual support  HPI/Patient Profile: 79 y.o. 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  It seems that Mrs. Labriola has had a drastic decline in her GI function since November 2020.  Clinical Assessment and Goals of Care: I have reviewed medical records including EPIC notes, labs and imaging, received report from bedside nursing staff, examined the patient and met at bedside to discuss diagnosis prognosis, GOC, EOL wishes, disposition and options.  I introduced Palliative Medicine as specialized medical care for people living with serious illness. It focuses on providing relief from the symptoms and stress of a serious illness.   We discussed a brief life review of the patient.  Mrs. Warmoth is a widow, she has 2 children a daughter Butch Penny and his son Paramedic.  She had been living independently prior to this hospitalization.  As far as functional and nutritional status, asked Mrs. Dumais if she has indeed lost 50 pounds over the last year and she tells me that it might be closer to 60 pounds.  I ask why she is not eating and she asks me if I have ever experienced food in my mouth getting bigger as it is chewed.  Per nursing staff she has had minimal by mouth  intake, and repeat hospitalizations for dehydration.  I share that "we must eat to live", and she says that is true.  Per nursing staff, daughter Butch Penny has been questioning a feeding tube.  Mrs. Stansbery and I talked about artificial feeding, a PEG tube.  She quickly and readily states that she would not accept a tube to feed her.  I asked her, "even if it means you were to pass away sooner without it?".  She states that yes even if it caused her to pass away sooner, she would not accept a tube through the skin of the stomach to feed her.  During our visit Mrs. Rutan did drink almost a full cup of Coca-Cola, and ate a container of vanilla ice cream, with no apparent problems.  We discussed her current illness and what it means in the larger context of her on-going co-morbidities.  Natural disease trajectory and expectations at EOL were discussed.  Mrs. Robben asks about her non healing wounds.  We talked about the importance of good nutrition for wound healing.  I attempted to elicit values and goals of care important to the patient.  We talked about going to rehab for strength  training and wound care.  Mrs. Thatch states that she is unsure about going to rehab, and will discuss this with her daughter.  The difference between aggressive medical intervention and comfort care was considered in light of the patient's goals of care.  Mrs. Pfahler tells me that she has been in the hospital so much lately, she voices her frustration.  We talked about going to rehab with palliative care to follow, continuing discussions about goals of care.  We also talked about going home with the benefits of at home "treat the treatable" hospice care.  Mrs. Moilanen asks relevant questions about hospice care in the home.  I shared that this is specialized care that is at no cost (already paid for under her Medicare) that keeps her goals at the focus.  They would provide nursing staff, aids to help with bathing, wound care.  I shared  that they would also help with bowel regimen and pain management.  Advanced directives, concepts specific to code status, artifical feeding and hydration, and rehospitalization were considered and discussed.  We talked about "treat the treatable but allowing natural passing".  Mrs. Koehn states that she is not sure about CODE STATUS, she would like to discuss this with her daughter and son.  I encouraged her to tell her family what is important to her, what she wants this time to feel like/be like.  A few times she mentions her funeral arrangements, and I shared that that is a gift for her family to know her wishes.  I encouraged her to consider what she this time to be, between now and when she passes.  Questions and concerns were addressed.     Conference with attending, bedside nursing staff, transition of care team related to patient condition, needs, goals of care discussions.   HCPOA    NEXT OF KIN -Mrs. Stoneberg names her daughter Butch Penny and her son Junior as her Warehouse manager.   SUMMARY OF RECOMMENDATIONS   Patient is considering the benefits of rehab We discussed CODE STATUS, she would like to discuss with her children We also discussed at home "treat the treatable" hospice care Palliative outpatient should follow if she goes to rehab   Code Status/Advance Care Planning:  Full code -we discussed "treat the treatable, but allowing natural passing".  Symptom Management:   Per hospitalist, no additional needs at this time.  Palliative Prophylaxis:   Bowel Regimen and Palliative Wound Care  Additional Recommendations (Limitations, Scope, Preferences):  Full Scope Treatment  Psycho-social/Spiritual:   Desire for further Chaplaincy support:no  Additional Recommendations: Caregiving  Support/Resources and Education on Hospice  Prognosis:   Unable to determine, 3 to 6 months or less would not be surprising based on 6 hospitalizations in 6 months, failure to  thrive, decreasing functional status.  Discharge Planning: Agreeable to attempt rehab, hopefully with palliative care to follow      Primary Diagnoses: Present on Admission: . AKI (acute kidney injury) ([place]) . Essential hypertension . Hypokalemia . Protein-calorie malnutrition, severe . Pressure injury of skin   I have reviewed the medical record, interviewed the patient and family, and examined the patient. The following aspects are pertinent.  Past Medical History:  Diagnosis Date  . Hyperlipidemia   . Hypertension   . Syncope 10/27/2019   Social History   Socioeconomic History  . Marital status: Widowed    Spouse name: Not on file  . Number of children: Not on file  . Years of education: Not on  file  . Highest education level: Not on file  Occupational History  . Not on file  Tobacco Use  . Smoking status: Never Smoker  . Smokeless tobacco: Current User    Types: Chew  Substance and Sexual Activity  . Alcohol use: No  . Drug use: No  . Sexual activity: Not Currently  Other Topics Concern  . Not on file  Social History Narrative  . Not on file   Social Determinants of Health   Financial Resource Strain:   . Difficulty of Paying Living Expenses:   Food Insecurity:   . Worried About Charity fundraiser in the Last Year:   . Arboriculturist in the Last Year:   Transportation Needs:   . Film/video editor (Medical):   Marland Kitchen Lack of Transportation (Non-Medical):   Physical Activity:   . Days of Exercise per Week:   . Minutes of Exercise per Session:   Stress:   . Feeling of Stress :   Social Connections:   . Frequency of Communication with Friends and Family:   . Frequency of Social Gatherings with Friends and Family:   . Attends Religious Services:   . Active Member of Clubs or Organizations:   . Attends Archivist Meetings:   Marland Kitchen Marital Status:    Family History  Problem Relation Age of Onset  . Alcoholism Father   . CAD Neg Hx     Scheduled Meds: . amLODipine  10 mg Oral Daily  . collagenase   Topical Daily  . enoxaparin (LOVENOX) injection  40 mg Subcutaneous Q24H  . feeding supplement (ENSURE ENLIVE)  237 mL Oral TID BM  . ferrous sulfate  325 mg Oral Q breakfast  . folic acid  1 mg Oral Daily  . gabapentin  200 mg Oral TID  . megestrol  160 mg Oral Daily  . potassium chloride  40 mEq Oral BID  . rosuvastatin  20 mg Oral Daily  . sodium chloride flush  3 mL Intravenous Once   Continuous Infusions: . sodium chloride 75 mL/hr at 03/01/20 0456  . sodium chloride 75 mL/hr at 03/01/20 0611   PRN Meds:.docusate sodium, ondansetron **OR** ondansetron (ZOFRAN) IV, oxyCODONE, polyethylene glycol Medications Prior to Admission:  Prior to Admission medications   Medication Sig Start Date End Date Taking? Authorizing Provider  acetaminophen (TYLENOL) 500 MG tablet Take 2 tablets (1,000 mg total) by mouth every 6 (six) hours. Patient taking differently: Take 1,000 mg by mouth every 6 (six) hours as needed for moderate pain.  02/02/20  Yes Meuth, Brooke A, PA-C  amLODipine (NORVASC) 10 MG tablet Take 10 mg by mouth daily. 09/25/19  Yes [provider]  collagenase (SANTYL) ointment Apply topically daily. Patient taking differently: Apply 1 application topically daily.  02/03/20  Yes Meuth, Blaine Hamper, PA-C  megestrol (MEGACE) 40 MG tablet Take 4 tablets (160 mg total) by mouth daily. 02/03/20 03/04/20 Yes Meuth, Brooke A, PA-C  oxyCODONE 10 MG TABS Take 0.5-1 tablets (5-10 mg total) by mouth every 6 (six) hours as needed for severe pain. Patient taking differently: Take 10 mg by mouth every 6 (six) hours as needed (for pain).  02/02/20  Yes Meuth, Brooke A, PA-C  polyethylene glycol (MIRALAX / GLYCOLAX) 17 g packet Take 17 g by mouth daily as needed for mild constipation. 02/02/20  Yes Meuth, Brooke A, PA-C  rosuvastatin (CRESTOR) 20 MG tablet Take 20 mg by mouth daily. 07/30/18  Yes [provider]  sodium chloride  flush (NS) 0.9 % SOLN 5 mLs by Intracatheter route daily for 28 days. 02/03/20 03/02/20 Yes Meuth, Brooke A, PA-C  docusate sodium (COLACE) 100 MG capsule Take 1 capsule (100 mg total) by mouth 2 (two) times daily as needed for mild constipation. Patient not taking: Reported on 02/29/2020 01/19/20   Norm Parcel, PA-C  feeding supplement, ENSURE ENLIVE, (ENSURE ENLIVE) LIQD Take 237 mLs by mouth 3 (three) times daily between meals. Patient not taking: Reported on 02/29/2020 02/03/20   Margie Billet A, PA-C  ferrous sulfate 325 (65 FE) MG tablet Take 1 tablet (325 mg total) by mouth daily with breakfast. Patient not taking: Reported on 02/29/2020 01/20/20   Norm Parcel, PA-C  folic acid (FOLVITE) 1 MG tablet Take 1 tablet (1 mg total) by mouth daily. Patient not taking: Reported on 02/29/2020 01/20/20   Dessa Phi, DO  gabapentin (NEURONTIN) 100 MG capsule Take 2 capsules (200 mg total) by mouth 3 (three) times daily. Patient not taking: Reported on 02/29/2020 01/23/20   Earnstine Regal, PA-C  ibuprofen (ADVIL) 200 MG tablet You can take 2 tablets every 6 hours as needed for pain.  Follow package directions.  You can buy this over-the-counter at any drugstore. Patient not taking: Reported on 02/29/2020 01/23/20   Earnstine Regal, PA-C  Multiple Vitamin (MULTIVITAMIN WITH MINERALS) TABS tablet Take 1 tablet by mouth daily. Patient not taking: Reported on 02/29/2020 01/19/20   Norm Parcel, PA-C  sulfamethoxazole-trimethoprim (BACTRIM DS) 800-160 MG tablet Take 1 tablet by mouth every 12 (twelve) hours. Patient not taking: Reported on 02/29/2020 02/02/20   Wellington Hampshire, PA-C   Allergies  Allergen Reactions  . Penicillins Hives, Itching and Rash    Has patient had a PCN reaction causing immediate rash, facial/tongue/throat swelling, SOB or lightheadedness with hypotension: Yes Has patient had a PCN reaction causing severe rash involving mucus membranes or skin necrosis: yes Has patient had a PCN  reaction that required hospitalization: unk Has patient had a PCN reaction occurring within the last 10 years: unk If all of the above answers are "NO", then may proceed with Cephalosporin use.    Review of Systems  Unable to perform ROS: Other    Physical Exam Vitals and nursing note reviewed.  Constitutional:      General: She is not in acute distress. Cardiovascular:     Rate and Rhythm: Normal rate.  Pulmonary:     Effort: Pulmonary effort is normal. No respiratory distress.  Abdominal:     General: Abdomen is flat.     Palpations: Abdomen is soft.     Comments: Mid line incision healing  Musculoskeletal:     Comments: Cachectic, frail and thin  Skin:    General: Skin is warm and dry.     Comments: Midline abdomen incision healing, sacral wound as noted by nursing/WOC  Neurological:     Mental Status: She is alert.     Comments: Oriented to person and place, thinks it is March  Psychiatric:     Comments: Calm and cooperative, not fearful     Vital Signs: BP (!) 113/48 (BP Location: Left Arm)   Pulse 64   Temp 97.7 F (36.5 C) (Oral)   Resp 16   Ht '6\' 1"'$  (1.854 m)   Wt 50.3 kg   SpO2 100%   BMI 14.64 kg/m  Pain Scale: 0-10   Pain Score: 3    SpO2: SpO2: 100 % O2 Device:SpO2: 100 %  O2 Flow Rate: .   IO: Intake/output summary:   Intake/Output Summary (Last 24 hours) at 03/01/2020 1436 Last data filed at 03/01/2020 1005 Gross per 24 hour  Intake 1318.74 ml  Output --  Net 1318.74 ml    LBM: Last BM Date: (PTA) Baseline Weight: Weight: 50.3 kg Most recent weight: Weight: 50.3 kg     Palliative Assessment/Data:   Flowsheet Rows     Most Recent Value  Intake Tab  Referral Department  Hospitalist  Unit at Time of Referral  Med/Surg Unit  Palliative Care Primary Diagnosis  Other (Comment) [SBO/abd pain]  Date Notified  03/01/20  Palliative Care Type  New Palliative care  Reason for referral  Clarify Goals of Care  Date of Admission  02/29/20  Date  first seen by Palliative Care  03/01/20  # of days Palliative referral response time  0 Day(s)  # of days IP prior to Palliative referral  1  Clinical Assessment  Palliative Performance Scale Score  30%  Pain Max last 24 hours  Not able to report  Pain Min Last 24 hours  Not able to report  Dyspnea Max Last 24 Hours  Not able to report  Dyspnea Min Last 24 hours  Not able to report  Psychosocial & Spiritual Assessment  Palliative Care Outcomes      Time In: 1400  Time Out: 1510  Time Total: 70 minutes Greater than 50%  of this time was spent counseling and coordinating care related to the above assessment and plan.  Signed by: Drue Novel, NP   Please contact Palliative Medicine Team phone at [phone] for questions and concerns.  For individual provider: See Shea Evans

## 2020-03-01 NOTE — Evaluation (Signed)
Physical Therapy Evaluation Patient Details Name: Samantha Gonzales MRN: [id number] DOB: [birth year] Today's Date: 03/01/2020   History of Present Illness  Pt is a 79 y.o. female with recent admission (d/c 02/03/20) for intraabdominal fluid collection and drain placement and removal 02/14/20, now readmitted 02/29/20 with weight loss, decreased oral intake and weakness. Workup for AKI, hypokelamia, malnutrition and anemia. Pt also with sacral ulcer stage IV.  Other PMH includes HTN, syncope.    Clinical Impression  Pt presents with an overall decrease in functional mobility secondary to above. PTA, pt reports independent and living with daughter; questionable historian. Today, pt required up to modA to maintain balance with ambulation; limited by generalized weakness, decreased activity tolerance and fatigue. Pt demonstrates poor safety awareness, decreased attention, and poor short-term memory. Pt would benefit from continued acute PT services to maximize functional mobility and independence prior to d/c with SNF-level therapies.     Follow Up Recommendations SNF;Supervision/Assistance - 24 hour    Equipment Recommendations  (defer)    Recommendations for Other Services       Precautions / Restrictions Precautions Precautions: Fall Restrictions Weight Bearing Restrictions: No      Mobility  Bed Mobility Overal bed mobility: Needs Assistance Bed Mobility: Supine to Sit     Supine to sit: Min guard        Transfers Overall transfer level: Needs assistance Equipment used: 1 person hand held assist Transfers: Sit to/from [employer] Sit to Stand: Min assist Stand pivot transfers: Min assist       General transfer comment: Reliant on HHA and minA to stand and maintain balance with pivot to [facility]  Ambulation/Gait Ambulation/Gait assistance: Min assist;Mod assist Gait Distance (Feet): 48 Feet Assistive device: None Gait Pattern/deviations: Step-through  pattern;Decreased stride length;Staggering left;Staggering right Gait velocity: Decreased   General Gait Details: Slow, unsteady gait with consistent minA to maintain balance, intermittent modA to prevent LOB; pt declining further distance secondary to fatigue; denies dizziness  Stairs            Wheelchair Mobility    Modified Rankin (Stroke Patients Only)       Balance Overall balance assessment: Needs assistance   Sitting balance-Leahy Scale: Fair       Standing balance-Leahy Scale: Poor Standing balance comment: LOB with static standing                             Pertinent Vitals/Pain Pain Assessment: Faces Faces Pain Scale: Hurts a little bit Pain Location: Sacral wound Pain Descriptors / Indicators: Grimacing;Guarding;Discomfort Pain Intervention(s): Monitored during session;Repositioned    Home Living Family/patient expects to be discharged to:: Private residence Living Arrangements: Children Available Help at Discharge: Family;Available PRN/intermittently Type of Home: House Home Access: Stairs to enter Entrance Stairs-Rails: Right Entrance Stairs-Number of Steps: 3 Home Layout: One level Home Equipment: Environmental consultant - 2 wheels Additional Comments: "My daughter is never there"    Prior Function Level of Independence: Needs assistance   Gait / Transfers Assistance Needed: Pt reports independent ambulation with device, denies falls; unsure reliable historian  ADL's / Homemaking Assistance Needed: Pt reports independent with bathing, although states, "I haven't been doing it as much as I should have"        Hand Dominance        Extremity/Trunk Assessment   Upper Extremity Assessment Upper Extremity Assessment: Generalized weakness    Lower Extremity Assessment Lower Extremity Assessment: Generalized weakness  Cervical / Trunk Assessment Cervical / Trunk Assessment: Kyphotic  Communication   Communication: No difficulties   Cognition Arousal/Alertness: Awake/alert Behavior During Therapy: WFL for tasks assessed/performed;Flat affect Overall Cognitive Status: No family/caregiver present to determine baseline cognitive functioning Area of Impairment: Orientation;Attention;Memory;Following commands;Safety/judgement;Awareness;Problem solving                 Orientation Level: Disoriented to;Situation Current Attention Level: Sustained Memory: Decreased short-term memory Following Commands: Follows one step commands inconsistently Safety/Judgement: Decreased awareness of safety;Decreased awareness of deficits Awareness: Intellectual Problem Solving: Requires verbal cues        General Comments General comments (skin integrity, edema, etc.): Difficulty opening small objects on breakfast tray and dialing phone, quick to ask for PT's assist, required encouragement to perform as much as independently as possible. Helped pt call daughter but no answer    Exercises     Assessment/Plan    PT Assessment Patient needs continued PT services  PT Problem List Decreased strength;Decreased activity tolerance;Decreased balance;Decreased mobility;Decreased cognition;Decreased knowledge of use of DME;Pain;Decreased skin integrity;Decreased safety awareness       PT Treatment Interventions DME instruction;Gait training;Stair training;Functional mobility training;Therapeutic activities;Therapeutic exercise;Balance training;Patient/family education;Cognitive remediation    PT Goals (Current goals can be found in the Care Plan section)  Acute Rehab PT Goals Patient Stated Goal: Initially declining SNF-level therapies, stating, "Can I go home today?" PT Goal Formulation: With patient Time For Goal Achievement: 03/15/20 Potential to Achieve Goals: Fair    Frequency Min 2X/week   Barriers to discharge        Co-evaluation PT/OT/SLP Co-Evaluation/Treatment: Yes Reason for Co-Treatment: Necessary to address  cognition/behavior during functional activity;For patient/therapist safety;To address functional/ADL transfers PT goals addressed during session: Mobility/safety with mobility;Balance         AM-PAC PT "6 Clicks" Mobility  Outcome Measure Help needed turning from your back to your side while in a flat bed without using bedrails?: A Little Help needed moving from lying on your back to sitting on the side of a flat bed without using bedrails?: A Little Help needed moving to and from a bed to a chair (including a wheelchair)?: A Little Help needed standing up from a chair using your arms (e.g., wheelchair or bedside chair)?: A Little Help needed to walk in hospital room?: A Lot Help needed climbing 3-5 steps with a railing? : A Lot 6 Click Score: 16    End of Session   Activity Tolerance: Patient tolerated treatment well;Patient limited by fatigue Patient left: in chair;with call bell/phone within reach;with chair alarm set Nurse Communication: Mobility status PT Visit Diagnosis: Other abnormalities of gait and mobility (R26.89);Muscle weakness (generalized) (M62.81)    Time: 4098-1191 PT Time Calculation (min) (ACUTE ONLY): 22 min   Charges:   PT Evaluation $PT Eval Moderate Complexity: [place], PT, DPT Acute Rehabilitation Services  Pager [phone] Office [place] 03/01/2020, 9:45 AM

## 2020-03-01 NOTE — Progress Notes (Signed)
Patient ID: Samantha Gonzales, female   DOB: 12/16/1940, 79 y.o.   MRN: [id number]       Subjective: Patient known to our service as she underwent an ex lap with SBR and LOA due to high grade SBO by Dr. Luisa Hart on 01/12/20.  She previously had a complicated course in November and December of 2020 related to her cholecystectomy, ERCP, and drain placements post op.  She was then readmitted after her drains had been removed for colitis.  She was then admitted in March for an obstruction for which she underwent the above stated procedure. She developed a pelvic fluid collection that required drain placement by IR.  This was removed on 02/14/20.  She has known chronic abdominal pain for which she uses hydrocodone for since 01/25/18.  She was brought to the ED yesterday secondary to generalized weakness and poor appetite with FTT.  She has a sacral wound that was evaluated by [facility] and they are managing.  We are asked to see her abdomen to make sure there are no further surgical recommendations.  The patient denies N/V.  She has constipation and this was noted on her recent CT on 4/20 as well, but no other findings.  She had an x-ray on this admission that is normal.  ROS: See above, otherwise other systems negative, except pain at her backside from her decubitus ulcer  Objective: Vital signs in last 24 hours: Temp:  [97.7 F (36.5 C)-98.6 F (37 C)] 97.7 F (36.5 C) (05/06 1032) Pulse Rate:  [64-98] 64 (05/06 1032) Resp:  [11-17] 16 (05/06 1032) BP: (99-159)/(35-100) 113/48 (05/06 1032) SpO2:  [100 %] 100 % (05/06 1032) Last BM Date: (PTA)  Intake/Output from previous day: 05/05 0701 - 05/06 0700 In: 1138.7 [P.O.:120; I.V.:918.7; IV Piggyback:100] Out: -  Intake/Output this shift: Total I/O In: 180 [P.O.:180] Out: -   PE: Abd: soft, minimally tender on left side of midline, no guarding, rebounding, or peritonitis.  Midline wound is almost completely healed.  No distention.  Lab Results:  Recent  Labs    02/29/20 1036 03/01/20 0730  WBC 10.6* 9.9  HGB 11.7* 10.2*  HCT 35.6* 30.8*  PLT 309 276   BMET Recent Labs    02/29/20 1036 03/01/20 0730  NA 142 141  K 2.4* 3.1*  CL 108 107  CO2 20* 21*  GLUCOSE 87 82  BUN 10 10  CREATININE 0.90 0.78  CALCIUM 9.4 8.9   PT/INR No results for input(s): LABPROT, INR in the last 72 hours. CMP     Component Value Date/Time   NA 141 03/01/2020 0730   K 3.1 (L) 03/01/2020 0730   CL 107 03/01/2020 0730   CO2 21 (L) 03/01/2020 0730   GLUCOSE 82 03/01/2020 0730   BUN 10 03/01/2020 0730   CREATININE 0.78 03/01/2020 0730   CALCIUM 8.9 03/01/2020 0730   PROT 5.9 (L) 02/29/2020 1036   ALBUMIN 2.9 (L) 02/29/2020 1036   AST 15 02/29/2020 1036   ALT 12 02/29/2020 1036   ALKPHOS 69 02/29/2020 1036   BILITOT 0.6 02/29/2020 1036   GFRNONAA >60 03/01/2020 0730   GFRAA >60 03/01/2020 0730   Lipase     Component Value Date/Time   LIPASE 21 02/29/2020 1124       Studies/Results: Abd 1 View (KUB)  Result Date: 02/29/2020 CLINICAL DATA:  Malnutrition. EXAM: ABDOMEN - 1 VIEW COMPARISON:  January 14, 2020. FINDINGS: The bowel gas pattern is normal. No radio-opaque calculi or other significant  radiographic abnormality are seen. IMPRESSION: No evidence of bowel obstruction or ileus. Electronically Signed   By: Marijo Conception M.D.   On: 02/29/2020 19:24    Anti-infectives: Anti-infectives (From admission, onward)   Start     Dose/Rate Route Frequency Ordered Stop   02/29/20 2200  sulfamethoxazole-trimethoprim (BACTRIM DS) 800-160 MG per tablet 1 tablet     1 tablet Oral Every 12 hours 02/29/20 1733 03/05/20 2159       Assessment/Plan S/p ex lap with SBR and LOA on 01-13-20, Dr. Brantley Stage No suspicion for post operative complications or cause of FTT. Do not think CT would add anything as she just recently had one on 4/20 that revealed resolution of post operative issues.  Chronic abdominal pain -  Has been taking hydrocodone since  2019 for this.  Unclear etiology.  But may continue to follow up with pain management as needed.  FTT Patient has been encouraged over her last several hospitalization to get up and move and to do things to help her get better.  She has no motivation to do this and has no motivation to try and eat.  No surgical fix for this and no further recommendations short of what is already being done, Megace, etc.  Would recommend daily bowel regimen to help with constipation as this can contribute to abdominal pain at times, especially in the context of chronic narcotic use.  Nothing else to add surgically.  We will sign off.   LOS: 1 day    Henreitta Cea , [facility] 03/01/2020, 11:54 AM Please see Amion for pager number during day hours 7:00am-4:30pm or 7:00am -11:30am on weekends

## 2020-03-01 NOTE — Progress Notes (Signed)
Initial Nutrition Assessment  DOCUMENTATION CODES:   Severe malnutrition in context of chronic illness, Underweight  INTERVENTION:   -D/c Ensure Enlive po BID, each supplement provides 350 kcal and 20 grams of protein -30 ml Prostat TID, each supplement provides 100 kcals and 15 grams protein -MVI with minerals daily -Hormel Shake TID, each supplement provides 520 kcals and 22 grams protein -Magic cup TID with meals, each supplement provides 290 kcal and 9 grams of protein  NUTRITION DIAGNOSIS:   Severe Malnutrition related to chronic illness(SBO with intra-abdominal fluid collections) as evidenced by severe fat depletion, severe muscle depletion, percent weight loss.  GOAL:   Patient will meet greater than or equal to 90% of their needs  MONITOR:   PO intake, Supplement acceptance, Labs, Weight trends, Skin, I & O's  REASON FOR ASSESSMENT:   Consult, Malnutrition Screening Tool Assessment of nutrition requirement/status  ASSESSMENT:   Samantha Gonzales is a 79 y.o. female with past medical history of a previous small bowel obstruction, discharged from the hospital on 02/03/2020 during this time on the CT scan she was found to have a 9.5 cm intra-abdominal fluid collection, radiologist was consulted percutaneous drain was placed on 01/30/2020 cultures grew E. coli and Enterococcus for which she has been on Bactrim and Zyvox which was eventually deescalated to Bactrim.  Drain was removed on 02/14/2020 CT scan back then showed no evidence of bowel fistula, acute kidney injury, severe protein caloric malnutrition and a sacral decubitus ulcer which has been cared for by her daughter, as per daughter state that she went to see her primary care doctor for normal checkup and she has lost since then about 50 pounds, was found to be weak and borderline hypotensive by her PCP and was sent to the ED, as per daughter she relates the mother has not been eating for the last several weeks despite Megace  she eats about half a sandwich every day.  Pt admitted with AKI.   Reviewed I/O's: +1.1 L x 24 hours  Per [facility] notes, pt with unstageable wound to sacrum/coccyx area, and a superficial wound to the healed mid-abdominal incision.   Spoke with pt at bedside, who was not very talkative this AM. Assisted RN in providing warm blankets, as she complained of being cold.   Pt reports she was eating well PTA, "whatever I wanted". She did not provide further detail despite probing. Per her report, she consumed all of her breakfast this morning (meal completion documented at 75%). Noted strawberry Ensure at bedside; pt reports "that's not for me".   Reviewed wt hx; pt has experienced a 38.4% wt loss over the past 6 months, which is significant for time frame.  Pt asked RD "did you see my wound on my bottom?". RD shared with pt that she was aware of presence of wound, but did not examine it personally. Pt is unsure what type of wound care is performed PTA or how long she has had it. Discussed importance of good meal and supplement intake to promote healing.   Palliative care consult pending for goals of care discussions.   Medications reviewed and include megace and 0.9% sodium chloride infusion @ 75 ml/hr.   Labs reviewed: K: 2.4.   NUTRITION - FOCUSED PHYSICAL EXAM:    Most Recent Value  Orbital Region  Severe depletion  Upper Arm Region  Severe depletion  Thoracic and Lumbar Region  Severe depletion  Buccal Region  Severe depletion  Temple Region  Severe depletion  Clavicle Bone Region  Severe depletion  Clavicle and Acromion Bone Region  Severe depletion  Scapular Bone Region  Severe depletion  Dorsal Hand  Severe depletion  Patellar Region  Severe depletion  Anterior Thigh Region  Severe depletion  Posterior Calf Region  Severe depletion  Edema (RD Assessment)  None  Hair  Reviewed  Eyes  Reviewed  Mouth  Reviewed  Skin  Reviewed  Nails  Reviewed       Diet Order:   Diet  Order            Diet regular Room service appropriate? Yes; Fluid consistency: Thin  Diet effective now              EDUCATION NEEDS:   Education needs have been addressed  Skin:  Skin Assessment: Skin Integrity Issues: Skin Integrity Issues:: Unstageable, Other (Comment) Stage IV: - Unstageable: sacrum and coccyx Other: superficial wound to the healed mid-abdominal incision  Last BM:  Unknown  Height:   Ht Readings from Last 1 Encounters:  02/29/20 6\' 1"  (1.854 m)    Weight:   Wt Readings from Last 1 Encounters:  02/29/20 50.3 kg    Ideal Body Weight:  75 kg  BMI:  Body mass index is 14.64 kg/m.  Estimated Nutritional Needs:   Kcal:  2050-2250  Protein:  110-125 grams  Fluid:  > 2 L    05-29-1979, RD, LDN, CDCES Registered Dietitian II Certified Diabetes Care and Education Specialist Please refer to [facility] for RD and/or RD on-call/weekend/after hours pager

## 2020-03-01 NOTE — Progress Notes (Signed)
Patient ID: Samantha Gonzales, female   DOB: August 05, 1941, 79 y.o.   MRN: [id number]  PROGRESS NOTE    ARYN SAFRAN  ZGY:[id number] DOB: 1940/11/01 DOA: 02/29/2020 PCP: System, Pcp Not In   Brief Narrative:  79 year old female with history of small bowel obstruction status post ex lap with lysis of adhesion and small bowel resection on 01/12/2020 with subsequent pelvic fluid collection/abscess which required IR guided percutaneous drainage on 01/30/2020 with cultures growing E. coli and Enterococcus for which she completed antibiotic treatment with Bactrim and Zyvox and subsequent drain removal; also was found to have unstageable pressure injury over the sacrum which required enzymatic debridement and subsequently was discharged on 02/03/2020 presented with generalized weakness, poor appetite and loss of weight.  Assessment & Plan:   Acute kidney injury Very poor oral intake -Baseline creatinine is around 0.4.  Presented with creatinine of 0.9.  Improving.  Continue IV fluids. -Encouraged the patient to increase oral intake.  DC Bactrim  Failure to thrive Severe protein calorie malnutrition Weight loss -Unclear cause.  Nutrition consult. -Palliative care consult for goals of care discussion  Unstageable sacral decubitus ulcer: Present on admission History of small bowel obstruction status post resection with subsequent pelvic abscess requiring IR guided drainage and antibiotic treatment -Currently afebrile with no leukocytosis.  Unclear if she has infected sacral decubitus or abdominal wall ulcer. -Spoke to general surgery and requested that they evaluate both these ulcers.  Hypokalemia Replace.  Repeat a.m. labs  Macrocytic anemia -Hemoglobin stable.  Monitor  Essential hypertension -Blood pressure on the lower side.  Continue amlodipine  Goals of care -Overall prognosis is guarded to poor as patient has obvious failure to thrive with loss of weight and poor oral intake.  Palliative care  consulted.   DVT prophylaxis: Lovenox Code Status: Full Family Communication: Patient at bedside Disposition Plan: Status is: Inpatient  Remains inpatient appropriate because: Of poor oral intake, acute kidney injury, failure to thrive.  Might need SNF placement based on PT/OT evaluation   Dispo: The patient is from: Home              Anticipated d/c is to: Home              Anticipated d/c date is: 1 day              Patient currently is not medically stable to d/c.  Consultants: General surgery/palliative care  Procedures: None  Antimicrobials: Bactrim   Subjective: Patient seen and examined at bedside.  Poor historian.  Awake but slightly confused to time.  No overnight fever or vomiting reported.  Objective: Vitals:   03/01/20 0100 03/01/20 0324 03/01/20 0550 03/01/20 1032  BP: (!) 99/35 (!) 109/59 (!) 117/55 (!) 113/48  Pulse:  75 68 64  Resp: 14 15 16 16   Temp:  98.6 F (37 C) 97.7 F (36.5 C) 97.7 F (36.5 C)  TempSrc:  Oral Oral Oral  SpO2:  100% 100% 100%  Weight:      Height:        Intake/Output Summary (Last 24 hours) at 03/01/2020 1122 Last data filed at 03/01/2020 1005 Gross per 24 hour  Intake 1318.74 ml  Output --  Net 1318.74 ml   Filed Weights   02/29/20 1022  Weight: 50.3 kg    Examination:  General exam: Appears calm and comfortable.  Looks chronically ill.  Awake, slightly confused to time Respiratory system: Bilateral decreased breath sounds at bases Cardiovascular system: S1 & S2  heard, Rate controlled Gastrointestinal system: Abdomen is nondistended, soft and minimally tender on the left of midline; midline wound is almost completely healed.  Normal bowel sounds heard. Extremities: No cyanosis, clubbing, edema   Data Reviewed: I have personally reviewed following labs and imaging studies  CBC: Recent Labs  Lab 02/29/20 1036 03/01/20 0730  WBC 10.6* 9.9  NEUTROABS 9.0*  --   HGB 11.7* 10.2*  HCT 35.6* 30.8*  MCV 104.1*  103.0*  PLT 309 578   Basic Metabolic Panel: Recent Labs  Lab 02/29/20 1036 03/01/20 0730  NA 142 141  K 2.4* 3.1*  CL 108 107  CO2 20* 21*  GLUCOSE 87 82  BUN 10 10  CREATININE 0.90 0.78  CALCIUM 9.4 8.9   GFR: Estimated Creatinine Clearance: 47.5 mL/min (by C-G formula based on SCr of 0.78 mg/dL). Liver Function Tests: Recent Labs  Lab 02/29/20 1036  AST 15  ALT 12  ALKPHOS 69  BILITOT 0.6  PROT 5.9*  ALBUMIN 2.9*   Recent Labs  Lab 02/29/20 1124  LIPASE 21   No results for input(s): AMMONIA in the last 168 hours. Coagulation Profile: No results for input(s): INR, PROTIME in the last 168 hours. Cardiac Enzymes: No results for input(s): CKTOTAL, CKMB, CKMBINDEX, TROPONINI in the last 168 hours. BNP (last 3 results) No results for input(s): PROBNP in the last 8760 hours. HbA1C: No results for input(s): HGBA1C in the last 72 hours. CBG: No results for input(s): GLUCAP in the last 168 hours. Lipid Profile: No results for input(s): CHOL, HDL, LDLCALC, TRIG, CHOLHDL, LDLDIRECT in the last 72 hours. Thyroid Function Tests: No results for input(s): TSH, T4TOTAL, FREET4, T3FREE, THYROIDAB in the last 72 hours. Anemia Panel: No results for input(s): VITAMINB12, FOLATE, FERRITIN, TIBC, IRON, RETICCTPCT in the last 72 hours. Sepsis Labs: No results for input(s): PROCALCITON, LATICACIDVEN in the last 168 hours.  No results found for this or any previous visit (from the past 240 hour(s)).       Radiology Studies: Abd 1 View (KUB)  Result Date: 02/29/2020 CLINICAL DATA:  Malnutrition. EXAM: ABDOMEN - 1 VIEW COMPARISON:  January 14, 2020. FINDINGS: The bowel gas pattern is normal. No radio-opaque calculi or other significant radiographic abnormality are seen. IMPRESSION: No evidence of bowel obstruction or ileus. Electronically Signed   By: Marijo Conception M.D.   On: 02/29/2020 19:24        Scheduled Meds: . amLODipine  10 mg Oral Daily  . collagenase   Topical  Daily  . enoxaparin (LOVENOX) injection  40 mg Subcutaneous Q24H  . feeding supplement (ENSURE ENLIVE)  237 mL Oral TID BM  . ferrous sulfate  325 mg Oral Q breakfast  . folic acid  1 mg Oral Daily  . gabapentin  200 mg Oral TID  . megestrol  160 mg Oral Daily  . potassium chloride  40 mEq Oral BID  . rosuvastatin  20 mg Oral Daily  . sodium chloride flush  3 mL Intravenous Once  . sulfamethoxazole-trimethoprim  1 tablet Oral Q12H   Continuous Infusions: . sodium chloride 75 mL/hr at 03/01/20 0456  . sodium chloride 75 mL/hr at 03/01/20 4696          Aline August, MD [facility] 03/01/2020, 11:22 AM

## 2020-03-02 DIAGNOSIS — E43 Unspecified severe protein-calorie malnutrition: Secondary | ICD-10-CM

## 2020-03-02 DIAGNOSIS — D519 Vitamin B12 deficiency anemia, unspecified: Secondary | ICD-10-CM

## 2020-03-02 LAB — CBC WITH DIFFERENTIAL/PLATELET
Abs Immature Granulocytes: 0.04 10*3/uL (ref 0.00–0.07)
Basophils Absolute: 0 10*3/uL (ref 0.0–0.1)
Basophils Relative: 0 %
Eosinophils Absolute: 0.1 10*3/uL (ref 0.0–0.5)
Eosinophils Relative: 1 %
HCT: 26.6 % — ABNORMAL LOW (ref 36.0–46.0)
Hemoglobin: 8.8 g/dL — ABNORMAL LOW (ref 12.0–15.0)
Immature Granulocytes: 1 %
Lymphocytes Relative: 12 %
Lymphs Abs: 0.9 10*3/uL (ref 0.7–4.0)
MCH: 34.1 pg — ABNORMAL HIGH (ref 26.0–34.0)
MCHC: 33.1 g/dL (ref 30.0–36.0)
MCV: 103.1 fL — ABNORMAL HIGH (ref 80.0–100.0)
Monocytes Absolute: 0.6 10*3/uL (ref 0.1–1.0)
Monocytes Relative: 8 %
Neutro Abs: 6.2 10*3/uL (ref 1.7–7.7)
Neutrophils Relative %: 78 %
Platelets: 231 10*3/uL (ref 150–400)
RBC: 2.58 MIL/uL — ABNORMAL LOW (ref 3.87–5.11)
RDW: 19.4 % — ABNORMAL HIGH (ref 11.5–15.5)
WBC: 7.9 10*3/uL (ref 4.0–10.5)
nRBC: 0 % (ref 0.0–0.2)

## 2020-03-02 LAB — COMPREHENSIVE METABOLIC PANEL
ALT: 9 U/L (ref 0–44)
AST: 12 U/L — ABNORMAL LOW (ref 15–41)
Albumin: 2.3 g/dL — ABNORMAL LOW (ref 3.5–5.0)
Alkaline Phosphatase: 51 U/L (ref 38–126)
Anion gap: 6 (ref 5–15)
BUN: 7 mg/dL — ABNORMAL LOW (ref 8–23)
CO2: 22 mmol/L (ref 22–32)
Calcium: 8.4 mg/dL — ABNORMAL LOW (ref 8.9–10.3)
Chloride: 112 mmol/L — ABNORMAL HIGH (ref 98–111)
Creatinine, Ser: 0.74 mg/dL (ref 0.44–1.00)
GFR calc Af Amer: >60 mL/min (ref >60)
GFR calc non Af Amer: >60 mL/min (ref >60)
Glucose, Bld: 91 mg/dL (ref 70–99)
Potassium: 3.5 mmol/L (ref 3.5–5.1)
Sodium: 140 mmol/L (ref 135–145)
Total Bilirubin: 0.4 mg/dL (ref 0.3–1.2)
Total Protein: 5 g/dL — ABNORMAL LOW (ref 6.5–8.1)

## 2020-03-02 LAB — TSH: TSH: 2.328 u[IU]/mL (ref 0.350–4.500)

## 2020-03-02 LAB — VITAMIN B12: Vitamin B-12: 173 pg/mL — ABNORMAL LOW (ref 180–914)

## 2020-03-02 LAB — FOLATE: Folate: 12.8 ng/mL (ref >5.9)

## 2020-03-02 LAB — AMMONIA: Ammonia: 36 umol/L — ABNORMAL HIGH (ref 9–35)

## 2020-03-02 LAB — MAGNESIUM: Magnesium: 1.8 mg/dL (ref 1.7–2.4)

## 2020-03-02 MED ORDER — CYANOCOBALAMIN 1000 MCG/ML IJ SOLN
1000.0000 ug | Freq: Every day | INTRAMUSCULAR | Status: DC
  Administered 2020-03-03 – 2020-03-05 (×3): 1000 ug via INTRAMUSCULAR
  Filled 2020-03-02 (×5): qty 1

## 2020-03-02 NOTE — Progress Notes (Signed)
Palliative: Mrs. Herbig is standing up at the closet at her room as I enter.  She is going through her clothes.  I ask what she is doing, and she tells me, "I'm going to kill [name]".  I asked her to sit down and talk with me, but she is somewhat agitated and perturbed.  She does eventually sit down with me, but is focused on getting snuff.  She asks me to go to the grocery store and get snuff for her, and I share that I will not.  I attempt to have a conversation with her about her care, she does tell me that she is going home, not to a rehab.  She also states that she would never accept a tube to feed her.  I ask if she would ever want to return to the hospital again, and she flatly states no.  We talked about going home with the benefits of the hospice care, but she is quite distracted.  I asked about calling her daughter, but she declines for me to call any family members at this time.  I give her the phone number for her son and daughter-in-law, and she attempts to call them.  Conference with attending and bedside nursing staff related to patient condition, needs, goals of care.  Plan: At this point Mrs. Mostafa declines rehab, she declines feeding tube.  She would clearly benefit from going home with the benefit of hospice.  25 minutes Lillia Carmel, NP Palliative Medicine Team Team phone [phone] Greater than 50% of this time was spent counseling and coordinating care related to the above assessment and plan.

## 2020-03-02 NOTE — Progress Notes (Signed)
Patient ID: Samantha Gonzales, female   DOB: Oct 10, 1941, 79 y.o.   MRN: [id number]  PROGRESS NOTE    SHINE SCROGHAM  WUJ:[id number] DOB: 1941/10/07 DOA: 02/29/2020 PCP: System, Pcp Not In   Brief Narrative:  79 year old female with history of small bowel obstruction status post ex lap with lysis of adhesion and small bowel resection on 01/12/2020 with subsequent pelvic fluid collection/abscess which required IR guided percutaneous drainage on 01/30/2020 with cultures growing E. coli and Enterococcus for which she completed antibiotic treatment with Bactrim and Zyvox and subsequent drain removal; also was found to have unstageable pressure injury over the sacrum which required enzymatic debridement and subsequently was discharged on 02/03/2020 presented with generalized weakness, poor appetite and loss of weight.  Assessment & Plan:   Acute kidney injury Very poor oral intake -Baseline creatinine is around 0.4.  Presented with creatinine of 0.9.  Improving.  Treated with IV fluids.  IV fluids discontinued. -Encouraged the patient to increase oral intake.  Bactrim discontinued.  Failure to thrive Severe protein calorie malnutrition Weight loss -Unclear cause.  Follow nutrition recommendations. -Palliative care consult for goals of care discussion appreciated.  As per palliative care discussion, patient does not want artificial feeding tube but daughter wishes that her mother has artificial feeding tube.  Difficult situation; would not pursue artificial feeding tube unless patient wants it.  Vitamin B12 deficiency -B12 level 173.  Will start parenteral supplementation while patient is in the hospital and switch to oral once she is ready to be discharged.  Unstageable sacral decubitus ulcer: Present on admission History of small bowel obstruction status post resection with subsequent pelvic abscess requiring IR guided drainage and antibiotic treatment -Currently afebrile with no leukocytosis.  No signs  of infection of these ulcers as per wound care/general surgery evaluation. -General surgery evaluation appreciated.  General surgery has signed off.  Hypokalemia -Improved.  Macrocytic anemia -Hemoglobin stable.  Monitor  Essential hypertension -Blood pressure on the lower side.  Continue amlodipine  Goals of care -Overall prognosis is guarded to poor as patient has obvious failure to thrive with loss of weight and poor oral intake.  Palliative care following. -PT/OT recommend SNF placement.  Social worker consulted.  DVT prophylaxis: Lovenox Code Status: Full Family Communication: Patient at bedside Disposition Plan: Status is: Inpatient  Patient is extremely deconditioned and needs SNF placement.  She is medically stable for discharge to SNF whenever the bed is available.  Dispo: The patient is from: Home              Anticipated d/c is to: SNF              Anticipated d/c date is: As soon as bed is available              Patient currently is medically stable to d/c.  Consultants: General surgery/palliative care  Procedures: None  Antimicrobials: Bactrim discontinued on 03/01/2020   Subjective: Patient seen and examined at bedside.  Poor historian.  Slightly confused to time.  No overnight fever, vomiting reported.  Complains of some abdominal pain. Objective: Vitals:   03/01/20 1740 03/01/20 2114 03/02/20 0134 03/02/20 0447  BP: (!) 97/51 (!) 100/38 (!) 101/40 (!) 107/43  Pulse: 82 74 70 64  Resp: 18 17 17 17   Temp: 98.5 F (36.9 C) 98.1 F (36.7 C) 98.7 F (37.1 C) 98.1 F (36.7 C)  TempSrc: Oral Oral Oral Oral  SpO2: 100% 100% 100% 100%  Weight:  Height:        Intake/Output Summary (Last 24 hours) at 03/02/2020 0815 Last data filed at 03/01/2020 1800 Gross per 24 hour  Intake 420 ml  Output --  Net 420 ml   Filed Weights   02/29/20 1022  Weight: 50.3 kg    Examination:  General exam: No acute distress.  Looks chronically ill.  Still slightly  confused to time.   Respiratory system: Bilateral decreased breath sounds at bases, no wheezing Cardiovascular system: Rate controlled, S1-S2 heard Gastrointestinal system: Abdomen is nondistended, soft and minimally tender on the left of midline; midline wound is almost completely healed.  Bowel sounds are heard  extremities: No cyanosis, clubbing, edema   Data Reviewed: I have personally reviewed following labs and imaging studies  CBC: Recent Labs  Lab 02/29/20 1036 03/01/20 0730 03/02/20 0323  WBC 10.6* 9.9 7.9  NEUTROABS 9.0*  --  6.2  HGB 11.7* 10.2* 8.8*  HCT 35.6* 30.8* 26.6*  MCV 104.1* 103.0* 103.1*  PLT 309 276 875   Basic Metabolic Panel: Recent Labs  Lab 02/29/20 1036 03/01/20 0730 03/02/20 0323  NA 142 141 140  K 2.4* 3.1* 3.5  CL 108 107 112*  CO2 20* 21* 22  GLUCOSE 87 82 91  BUN 10 10 7*  CREATININE 0.90 0.78 0.74  CALCIUM 9.4 8.9 8.4*  MG  --   --  1.8   GFR: Estimated Creatinine Clearance: 47.5 mL/min (by C-G formula based on SCr of 0.74 mg/dL). Liver Function Tests: Recent Labs  Lab 02/29/20 1036 03/02/20 0323  AST 15 12*  ALT 12 9  ALKPHOS 69 51  BILITOT 0.6 0.4  PROT 5.9* 5.0*  ALBUMIN 2.9* 2.3*   Recent Labs  Lab 02/29/20 1124  LIPASE 21   Recent Labs  Lab 03/02/20 0323  AMMONIA 36*   Coagulation Profile: No results for input(s): INR, PROTIME in the last 168 hours. Cardiac Enzymes: No results for input(s): CKTOTAL, CKMB, CKMBINDEX, TROPONINI in the last 168 hours. BNP (last 3 results) No results for input(s): PROBNP in the last 8760 hours. HbA1C: No results for input(s): HGBA1C in the last 72 hours. CBG: No results for input(s): GLUCAP in the last 168 hours. Lipid Profile: No results for input(s): CHOL, HDL, LDLCALC, TRIG, CHOLHDL, LDLDIRECT in the last 72 hours. Thyroid Function Tests: Recent Labs    03/02/20 0323  TSH 2.328   Anemia Panel: Recent Labs    03/02/20 0323  VITAMINB12 173*  FOLATE 12.8   Sepsis  Labs: No results for input(s): PROCALCITON, LATICACIDVEN in the last 168 hours.  No results found for this or any previous visit (from the past 240 hour(s)).       Radiology Studies: Abd 1 View (KUB)  Result Date: 02/29/2020 CLINICAL DATA:  Malnutrition. EXAM: ABDOMEN - 1 VIEW COMPARISON:  January 14, 2020. FINDINGS: The bowel gas pattern is normal. No radio-opaque calculi or other significant radiographic abnormality are seen. IMPRESSION: No evidence of bowel obstruction or ileus. Electronically Signed   By: Marijo Conception M.D.   On: 02/29/2020 19:24        Scheduled Meds: . amLODipine  10 mg Oral Daily  . collagenase   Topical Daily  . enoxaparin (LOVENOX) injection  40 mg Subcutaneous Q24H  . feeding supplement (PRO-STAT SUGAR FREE 64)  30 mL Oral TID  . ferrous sulfate  325 mg Oral Q breakfast  . folic acid  1 mg Oral Daily  . gabapentin  200 mg Oral  TID  . megestrol  160 mg Oral Daily  . multivitamin with minerals  1 tablet Oral Daily  . potassium chloride  40 mEq Oral BID  . rosuvastatin  20 mg Oral Daily  . sodium chloride flush  3 mL Intravenous Once   Continuous Infusions:         Glade Lloyd, MD [facility] 03/02/2020, 8:15 AM

## 2020-03-02 NOTE — TOC Initial Note (Signed)
Transition of Care [facility]) - Initial/Assessment Note    Patient Details  Name: Samantha Gonzales MRN: [id number] Date of Birth: 27-Sep-1941  [facility]) CM/SW Contact:    Doy Hutching, LCSW Phone Number: 03/02/2020, 1:32 PM  Clinical Narrative:                 CSW spoke with pt daughter Samantha Gonzales via telephone, [phone]. Introduced self, role, reason for call. Pt from home with her daughter. She has had home health in the past but not currently active. Pt daughter confirmed PCP and home address. CSW explained recommendations for SNF placement and what SNF placement looks like/insurance prior auth. Pt daughter thinks that SNF is best for pt at this time but states she will talk to her about what pt desires today.   CSW did receive permission to send out referrals to SNF. CSW also initiated insurance auth for pt, pending choice/disposition. TOC team to f/u.      Expected Discharge Plan: Skilled Nursing Facility Barriers to Discharge: Continued Medical Work up, English as a second language teacher   Patient Goals and CMS Choice Patient states their goals for this hospitalization and ongoing recovery are:: for her to go to rehab CMS Medicare.gov Compare Post Acute Care list provided to:: Patient Represenative (must comment)(pt daughter Samantha Gonzales) Choice offered to / list presented to : Adult Children  Expected Discharge Plan and Services Expected Discharge Plan: Skilled Nursing Facility In-house Referral: Clinical Social Work Discharge Planning Services: CM Consult Post Acute Care Choice: Skilled Nursing Facility Living arrangements for the past 2 months: Single Family Home    Prior Living Arrangements/Services Living arrangements for the past 2 months: Single Family Home Lives with:: Self Patient language and need for interpreter reviewed:: Yes(no needs) Do you feel safe going back to the place where you live?: Yes      Need for Family Participation in Patient Care: Yes  (Comment)(assistance with daily cares) Care giver support system in place?: Yes (comment)(pt children and extended family) Current home services: DME Criminal Activity/Legal Involvement Pertinent to Current Situation/Hospitalization: No - Comment as needed  Permission Sought/Granted Permission sought to share information with : Facility Medical sales representative, Family Supports Permission granted to share information with : Yes, Verbal Permission Granted  Share Information with NAME: Samantha Gonzales  Permission granted to share info w AGENCY: SNFs  Permission granted to share info w Relationship: daughter  Permission granted to share info w Contact Information: [phone]  Emotional Assessment Appearance:: Appears stated age Attitude/Demeanor/Rapport: Other (comment)(telephonic assessment w/ daughter Samantha Gonzales) Affect (typically observed): Other (comment)(telephonic assessment w/ daughter Samantha Gonzales) Orientation: : Oriented to Self, Oriented to Place, Oriented to  Time, Oriented to Situation, Fluctuating Orientation (Suspected and/or reported Sundowners) Alcohol / Substance Use: Not Applicable Psych Involvement: (n/a)  Admission diagnosis:  Hypokalemia [E87.6] Severe protein-calorie malnutrition (HCC) [E43] Failure to thrive in adult [R62.7] AKI (acute kidney injury) (HCC) [N17.9] Macrocytic anemia [D53.9] Abdominal pain [R10.9] Patient Active Problem List   Diagnosis Date Noted  . Failure to thrive in adult   . Goals of care, counseling/discussion   . Palliative care by specialist   . DNR (do not resuscitate) discussion   . Macrocytic anemia 02/29/2020  . 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 Protein-calorie malnutrition, severe 01/19/2020  . Murmur, heart 01/11/2020  . SBO (small bowel obstruction) (HCC) 01/11/2020  . Small bowel obstruction (HCC) 12/29/2019  . Pressure injury of skin 10/29/2019  . Colitis 10/28/2019  . AKI (acute kidney injury) (HCC) 10/27/2019  .  Syncope  10/27/2019  . Hypokalemia 10/27/2019  . Essential hypertension 10/27/2019  . Malnutrition of moderate degree 09/07/2019  . Cholecystitis 09/04/2019  . Altered mental status 01/18/2018  . Altered mental state 01/17/2018   PCP:  Chesley Noon, MD Pharmacy:   [facility] [place], [place] - [address] [place] [address] Phone: [phone] Fax: [phone]  Readmission Risk Interventions Readmission Risk Prevention Plan 03/02/2020 02/03/2020  Transportation Screening Complete -  PCP or Specialist Appt within 3-5 Days Not Complete Complete  Not Complete comments poss plan for SNF -  [place] or [place] Complete Complete  Social Work Consult for [place] Planning/Counseling Complete Complete  Palliative Care Screening Complete Not Applicable  Medication Review Press photographer) Referral to Pharmacy Complete  Some recent data might be hidden

## 2020-03-03 MED ORDER — MORPHINE SULFATE (PF) 2 MG/ML IV SOLN
2.0000 mg | Freq: Once | INTRAVENOUS | Status: AC
  Administered 2020-03-04: 2 mg via INTRAVENOUS
  Filled 2020-03-03: qty 1

## 2020-03-03 NOTE — Progress Notes (Signed)
Palliative: Mrs. Teagarden is resting quietly in bed.  She appears chronically ill and frail, cachectic.  She is uncooperative at times and is unwilling to talk with me today.  She is able to make her basic needs known.  There is no family at bedside at this time.   Call to daughter, Myrth Dahan.  Left general VM message requesting return call.    Conference with attending, bedside nursing staff and [facility] team related to patient condition, needs, GOC, outpatient palliative referral.   Plan:   Daughter is requesting rehab, but Mrs. Deleon is very functional.  Wound care can be done by in home hospice team if patient/family elects hospice care.  Mrs. Aarons would benefit from out patient palliative to follow.   25 minutes Lillia Carmel, NP Palliative Medicine Team Team Phone # [phone] Greater than 50% of this time was spent counseling and coordinating care related to the above assessment and plan.

## 2020-03-03 NOTE — Progress Notes (Signed)
Patient ID: Samantha Gonzales, female   DOB: 1941-02-20, 79 y.o.   MRN: [id number]  PROGRESS NOTE    Samantha Gonzales  KKX:[id number] DOB: August 28, 1941 DOA: 02/29/2020 PCP: Chesley Noon, MD   Brief Narrative:  79 year old female with history of small bowel obstruction status post ex lap with lysis of adhesion and small bowel resection on 01/12/2020 with subsequent pelvic fluid collection/abscess which required IR guided percutaneous drainage on 01/30/2020 with cultures growing E. coli and Enterococcus for which she completed antibiotic treatment with Bactrim and Zyvox and subsequent drain removal; also was found to have unstageable pressure injury over the sacrum which required enzymatic debridement and subsequently was discharged on 02/03/2020 presented with generalized weakness, poor appetite and loss of weight.  Assessment & Plan:   Acute kidney injury Very poor oral intake -Baseline creatinine is around 0.4.  Presented with creatinine of 0.9.  Improving.  Treated with IV fluids.  IV fluids discontinued. -Encouraged the patient to increase oral intake.  Bactrim discontinued.  Failure to thrive Severe protein calorie malnutrition Weight loss -Unclear cause.  Follow nutrition recommendations. -Palliative care consult for goals of care discussion appreciated.  As per palliative care discussion, patient does not want artificial feeding tube but daughter wishes that her mother has artificial feeding tube.  Difficult situation; would not pursue artificial feeding tube unless patient wants it. -Recommend home/essential hospice if condition were to not improve  Vitamin B12 deficiency -B12 level 173.  Continue parenteral supplementation while patient is in the hospital and switch to oral once she is ready to be discharged.  Unstageable sacral decubitus ulcer: Present on admission History of small bowel obstruction status post resection with subsequent pelvic abscess requiring IR guided drainage and  antibiotic treatment -Currently afebrile with no leukocytosis.  No signs of infection of these ulcers as per wound care/general surgery evaluation. -General surgery evaluation appreciated.  General surgery has signed off.  Hypokalemia -Improved.  Macrocytic anemia -Hemoglobin stable.  Monitor intermittently  Essential hypertension -Monitor blood pressure.  Continue amlodipine  Goals of care -Overall prognosis is guarded to poor as patient has obvious failure to thrive with loss of weight and poor oral intake.  Palliative care following. -PT/OT recommend SNF placement.  Social worker consulted.  DVT prophylaxis: Lovenox Code Status: Full Family Communication: None at bedside Disposition Plan: Status is: Inpatient  Patient is extremely deconditioned and needs SNF placement.  She is medically stable for discharge to SNF whenever the bed is available.  Dispo: The patient is from: Home              Anticipated d/c is to: SNF              Anticipated d/c date is: As soon as bed is available              Patient currently is medically stable to d/c.  Consultants: General surgery/palliative care  Procedures: None  Antimicrobials: Bactrim discontinued on 03/01/2020   Subjective: Patient seen and examined at bedside.  Extremely poor historian.  Sleepy, wakes up slightly, does not answer most questions.  Patient had episodes of agitation yesterday afternoon.  No overnight fever or vomiting reported. Objective: Vitals:   03/02/20 1632 03/02/20 2152 03/03/20 0531 03/03/20 0811  BP: (!) 133/57 (!) 119/56 (!) 127/46 (!) 147/97  Pulse: 79 81 79   Resp: 15 15 16    Temp: 97.7 F (36.5 C) 98.8 F (37.1 C)    TempSrc: Oral Oral Oral   SpO2: 100%  100% 100%   Weight:      Height:        Intake/Output Summary (Last 24 hours) at 03/03/2020 0814 Last data filed at 03/03/2020 0344 Gross per 24 hour  Intake 500 ml  Output -  Net 500 ml   Filed Weights   02/29/20 1022  Weight: 50.3 kg     Examination:  General exam: No distress.  Looks chronically ill.  Sleepy, wakes up slightly, hardly answers any questions.   Respiratory system: Bilateral decreased breath sounds at bases, some scattered crackles Cardiovascular system: S1-S2 heard, rate controlled Gastrointestinal system: Abdomen is nondistended, soft and minimally tender on the left of midline; midline wound is almost completely healed.  Bowel sounds are heard  extremities: No edema or clubbing  Data Reviewed: I have personally reviewed following labs and imaging studies  CBC: Recent Labs  Lab 02/29/20 1036 03/01/20 0730 03/02/20 0323  WBC 10.6* 9.9 7.9  NEUTROABS 9.0*  --  6.2  HGB 11.7* 10.2* 8.8*  HCT 35.6* 30.8* 26.6*  MCV 104.1* 103.0* 103.1*  PLT 309 276 231   Basic Metabolic Panel: Recent Labs  Lab 02/29/20 1036 03/01/20 0730 03/02/20 0323  NA 142 141 140  K 2.4* 3.1* 3.5  CL 108 107 112*  CO2 20* 21* 22  GLUCOSE 87 82 91  BUN 10 10 7*  CREATININE 0.90 0.78 0.74  CALCIUM 9.4 8.9 8.4*  MG  --   --  1.8   GFR: Estimated Creatinine Clearance: 47.5 mL/min (by C-G formula based on SCr of 0.74 mg/dL). Liver Function Tests: Recent Labs  Lab 02/29/20 1036 03/02/20 0323  AST 15 12*  ALT 12 9  ALKPHOS 69 51  BILITOT 0.6 0.4  PROT 5.9* 5.0*  ALBUMIN 2.9* 2.3*   Recent Labs  Lab 02/29/20 1124  LIPASE 21   Recent Labs  Lab 03/02/20 0323  AMMONIA 36*   Coagulation Profile: No results for input(s): INR, PROTIME in the last 168 hours. Cardiac Enzymes: No results for input(s): CKTOTAL, CKMB, CKMBINDEX, TROPONINI in the last 168 hours. BNP (last 3 results) No results for input(s): PROBNP in the last 8760 hours. HbA1C: No results for input(s): HGBA1C in the last 72 hours. CBG: No results for input(s): GLUCAP in the last 168 hours. Lipid Profile: No results for input(s): CHOL, HDL, LDLCALC, TRIG, CHOLHDL, LDLDIRECT in the last 72 hours. Thyroid Function Tests: Recent Labs     03/02/20 0323  TSH 2.328   Anemia Panel: Recent Labs    03/02/20 0323  VITAMINB12 173*  FOLATE 12.8   Sepsis Labs: No results for input(s): PROCALCITON, LATICACIDVEN in the last 168 hours.  No results found for this or any previous visit (from the past 240 hour(s)).       Radiology Studies: No results found.      Scheduled Meds: . amLODipine  10 mg Oral Daily  . collagenase   Topical Daily  . cyanocobalamin  1,000 mcg Intramuscular Daily  . enoxaparin (LOVENOX) injection  40 mg Subcutaneous Q24H  . feeding supplement (PRO-STAT SUGAR FREE 64)  30 mL Oral TID  . ferrous sulfate  325 mg Oral Q breakfast  . folic acid  1 mg Oral Daily  . gabapentin  200 mg Oral TID  . megestrol  160 mg Oral Daily  . multivitamin with minerals  1 tablet Oral Daily  . potassium chloride  40 mEq Oral BID  . rosuvastatin  20 mg Oral Daily  . sodium chloride flush  3 mL Intravenous Once   Continuous Infusions:         Glade Lloyd, MD [facility] 03/03/2020, 8:14 AM

## 2020-03-03 NOTE — TOC Progression Note (Addendum)
Transition of Care [facility]) - Progression Note    Patient Details  Name: Samantha Gonzales MRN: [id number] Date of Birth: October 03, 1941  Transition of [facility]) CM/SW Contact  Gildardo Griffes, [place] Phone Number: 03/03/2020, 10:39 AM  Clinical Narrative:     CSW called patient's daughter [name] to provide bed offers for choice, no answer, lvm at [phone].   Pending call back for SNF choice.   Expected Discharge Plan: Skilled Nursing Facility Barriers to Discharge: Continued Medical Work up, English as a second language teacher  Expected Discharge Plan and Services Expected Discharge Plan: Skilled Nursing Facility In-house Referral: Clinical Social Work Discharge Planning Services: CM Consult Post Acute Care Choice: Skilled Nursing Facility Living arrangements for the past 2 months: Single Family Home                                       Social Determinants of Health (SDOH) Interventions    Readmission Risk Interventions Readmission Risk Prevention Plan 03/02/2020 02/03/2020  Transportation Screening Complete -  PCP or Specialist Appt within 3-5 Days Not Complete Complete  Not Complete comments poss plan for SNF -  HRI or Home Care Consult Complete Complete  Social Work Consult for Recovery Care Planning/Counseling Complete Complete  Palliative Care Screening Complete Not Applicable  Medication Review Oceanographer) Referral to Pharmacy Complete  Some recent data might be hidden

## 2020-03-04 MED ORDER — ACETAMINOPHEN 325 MG PO TABS
650.0000 mg | ORAL_TABLET | Freq: Four times a day (QID) | ORAL | Status: DC | PRN
  Administered 2020-03-04 – 2020-03-05 (×3): 650 mg via ORAL
  Filled 2020-03-04 (×3): qty 2

## 2020-03-04 NOTE — Progress Notes (Signed)
Patient ID: Samantha Gonzales, female   DOB: 1941/05/26, 79 y.o.   MRN: [id number]  PROGRESS NOTE    Samantha Gonzales  PJA:[id number] DOB: 06-05-1941 DOA: 02/29/2020 PCP: Eartha Inch, MD   Brief Narrative:  79 year old female with history of small bowel obstruction status post ex lap with lysis of adhesion and small bowel resection on 01/12/2020 with subsequent pelvic fluid collection/abscess which required IR guided percutaneous drainage on 01/30/2020 with cultures growing E. coli and Enterococcus for which she completed antibiotic treatment with Bactrim and Zyvox and subsequent drain removal; also was found to have unstageable pressure injury over the sacrum which required enzymatic debridement and subsequently was discharged on 02/03/2020 presented with generalized weakness, poor appetite and loss of weight.  Assessment & Plan:   Acute kidney injury Very poor oral intake -Baseline creatinine is around 0.4.  Presented with creatinine of 0.9.  Improving.  Treated with IV fluids.  IV fluids discontinued. -Encouraged the patient to increase oral intake.  Bactrim discontinued.  Failure to thrive Severe protein calorie malnutrition Weight loss -Unclear cause.  Follow nutrition recommendations. -Palliative care consult for goals of care discussion appreciated.  As per palliative care discussion, patient does not want artificial feeding tube but daughter wishes that her mother has artificial feeding tube.  Difficult situation; would not pursue artificial feeding tube unless patient wants it. -Recommend home/residential hospice if condition were to not improve -Currently remains full code.  Vitamin B12 deficiency -B12 level 173.  Continue parenteral supplementation while patient is in the hospital and switch to oral once she is ready to be discharged.  Unstageable sacral decubitus ulcer: Present on admission History of small bowel obstruction status post resection with subsequent pelvic abscess  requiring IR guided drainage and antibiotic treatment -Currently afebrile with no leukocytosis.  No signs of infection of these ulcers as per wound care/general surgery evaluation. -General surgery evaluation appreciated.  General surgery has signed off.  Hypokalemia -Improved.  No labs today.  Macrocytic anemia -Hemoglobin stable.  Monitor intermittently  Essential hypertension -Monitor blood pressure.  Continue amlodipine  Goals of care -Overall prognosis is guarded to poor as patient has obvious failure to thrive with loss of weight and poor oral intake.  Palliative care following.  Recommend palliative care as an outpatient versus home hospice if patient/family agreeable -PT/OT recommend SNF placement.  Social worker consulted.  DVT prophylaxis: Lovenox Code Status: Full Family Communication: None at bedside Disposition Plan: Status is: Inpatient  Patient is extremely deconditioned and needs SNF placement.  She is medically stable for discharge to SNF whenever the bed is available.  Dispo: The patient is from: Home              Anticipated d/c is to: SNF              Anticipated d/c date is: As soon as bed is available              Patient currently is medically stable to d/c.  Consultants: General surgery/palliative care  Procedures: None  Antimicrobials: Bactrim discontinued on 03/01/2020   Subjective: Patient seen and examined at bedside.  Extremely poor historian.  No overnight fever, vomiting reported.    Objective: Vitals:   03/03/20 0811 03/03/20 1406 03/03/20 2046 03/04/20 0620  BP: (!) 147/97 128/62 113/79 132/64  Pulse:  74 79 76  Resp:  18 14 14   Temp:  97.8 F (36.6 C) 98.8 F (37.1 C) 98.1 F (36.7 C)  TempSrc:  Oral  Oral Oral  SpO2:  100% 100% 100%  Weight:      Height:        Intake/Output Summary (Last 24 hours) at 03/04/2020 0827 Last data filed at 03/03/2020 2129 Gross per 24 hour  Intake 960 ml  Output --  Net 960 ml   Filed Weights    02/29/20 1022  Weight: 50.3 kg    Examination:  General exam: No acute distress.  Looks chronically ill.  Wakes up slightly, does not answer most questions.  Slightly confused. Respiratory system: Bilateral decreased breath sounds at bases, no wheezing cardiovascular system: Rate controlled, S1-S2 heard Gastrointestinal system: Abdomen is nondistended, soft and minimally tender on the left of midline; midline wound is almost completely healed.  Bowel sounds are heard  extremities: No cyanosis or clubbing  Data Reviewed: I have personally reviewed following labs and imaging studies  CBC: Recent Labs  Lab 02/29/20 1036 03/01/20 0730 03/02/20 0323  WBC 10.6* 9.9 7.9  NEUTROABS 9.0*  --  6.2  HGB 11.7* 10.2* 8.8*  HCT 35.6* 30.8* 26.6*  MCV 104.1* 103.0* 103.1*  PLT 309 276 231   Basic Metabolic Panel: Recent Labs  Lab 02/29/20 1036 03/01/20 0730 03/02/20 0323  NA 142 141 140  K 2.4* 3.1* 3.5  CL 108 107 112*  CO2 20* 21* 22  GLUCOSE 87 82 91  BUN 10 10 7*  CREATININE 0.90 0.78 0.74  CALCIUM 9.4 8.9 8.4*  MG  --   --  1.8   GFR: Estimated Creatinine Clearance: 47.5 mL/min (by C-G formula based on SCr of 0.74 mg/dL). Liver Function Tests: Recent Labs  Lab 02/29/20 1036 03/02/20 0323  AST 15 12*  ALT 12 9  ALKPHOS 69 51  BILITOT 0.6 0.4  PROT 5.9* 5.0*  ALBUMIN 2.9* 2.3*   Recent Labs  Lab 02/29/20 1124  LIPASE 21   Recent Labs  Lab 03/02/20 0323  AMMONIA 36*   Coagulation Profile: No results for input(s): INR, PROTIME in the last 168 hours. Cardiac Enzymes: No results for input(s): CKTOTAL, CKMB, CKMBINDEX, TROPONINI in the last 168 hours. BNP (last 3 results) No results for input(s): PROBNP in the last 8760 hours. HbA1C: No results for input(s): HGBA1C in the last 72 hours. CBG: No results for input(s): GLUCAP in the last 168 hours. Lipid Profile: No results for input(s): CHOL, HDL, LDLCALC, TRIG, CHOLHDL, LDLDIRECT in the last 72  hours. Thyroid Function Tests: Recent Labs    03/02/20 0323  TSH 2.328   Anemia Panel: Recent Labs    03/02/20 0323  VITAMINB12 173*  FOLATE 12.8   Sepsis Labs: No results for input(s): PROCALCITON, LATICACIDVEN in the last 168 hours.  No results found for this or any previous visit (from the past 240 hour(s)).       Radiology Studies: No results found.      Scheduled Meds: . amLODipine  10 mg Oral Daily  . collagenase   Topical Daily  . cyanocobalamin  1,000 mcg Intramuscular Daily  . enoxaparin (LOVENOX) injection  40 mg Subcutaneous Q24H  . feeding supplement (PRO-STAT SUGAR FREE 64)  30 mL Oral TID  . ferrous sulfate  325 mg Oral Q breakfast  . folic acid  1 mg Oral Daily  . gabapentin  200 mg Oral TID  . megestrol  160 mg Oral Daily  . multivitamin with minerals  1 tablet Oral Daily  . potassium chloride  40 mEq Oral BID  . rosuvastatin  20 mg Oral  Daily  . sodium chloride flush  3 mL Intravenous Once   Continuous Infusions:         Samantha August, MD [facility] 03/04/2020, 8:27 AM

## 2020-03-04 NOTE — Plan of Care (Signed)
  Problem: Clinical Measurements: Goal: Ability to maintain clinical measurements within normal limits will improve Outcome: Not Progressing   Problem: Coping: Goal: Level of anxiety will decrease Outcome: Not Progressing   Problem: Pain Managment: Goal: General experience of comfort will improve Outcome: Not Progressing   

## 2020-03-04 NOTE — TOC Progression Note (Addendum)
Transition of Care [facility]) - Progression Note    Patient Details  Name: Samantha Gonzales MRN: [id number] Date of Birth: March 29, 1941  [facility] CM/SW Contact  Terrial Rhodes, LCSWA Phone Number: 03/04/2020, 10:42 AM  Clinical Narrative:     CSW left voicemail for patients daughter [name]. CSW also tried to call patients daughter Morton Stall. She told CSW that [name] should be in patients room now. CSW let patients daughter Morton Stall know that she will go by room. CSW went by room and daughter [name] was not in the room.CSW waiting on call back to offer SNF choices. Insurance authorization has been started. Will need to call [employer] when we have facility choice for authorization number. Put in an order for a new covid with physician.  CSW will continue to follow.  CSW will continue to follow.   Expected Discharge Plan: Skilled Nursing Facility Barriers to Discharge: Continued Medical Work up, English as a second language teacher  Expected Discharge Plan and Services Expected Discharge Plan: Skilled Nursing Facility In-house Referral: Clinical Social Work Discharge Planning Services: CM Consult Post Acute Care Choice: Skilled Nursing Facility Living arrangements for the past 2 months: Single Family Home                                       Social Determinants of Health (SDOH) Interventions    Readmission Risk Interventions Readmission Risk Prevention Plan 03/02/2020 02/03/2020  Transportation Screening Complete -  PCP or Specialist Appt within 3-5 Days Not Complete Complete  Not Complete comments poss plan for SNF -  HRI or Home Care Consult Complete Complete  Social Work Consult for Recovery Care Planning/Counseling Complete Complete  Palliative Care Screening Complete Not Applicable  Medication Review Oceanographer) Referral to Pharmacy Complete  Some recent data might be hidden

## 2020-03-05 MED ORDER — MEGESTROL ACETATE 40 MG PO TABS: 160.0000 mg | ORAL_TABLET | Freq: Every day | ORAL | 0 refills | Status: AC

## 2020-03-05 MED ORDER — ACETAMINOPHEN 500 MG PO TABS: 1000.0000 mg | ORAL_TABLET | Freq: Four times a day (QID) | ORAL | Status: AC | PRN

## 2020-03-05 MED ORDER — VITAMIN B-12 1000 MCG PO TABS
1000.0000 ug | ORAL_TABLET | Freq: Every day | ORAL | 0 refills | Status: DC
Start: 2020-03-05 — End: 2021-06-28

## 2020-03-05 NOTE — Progress Notes (Signed)
[facility] Liaison Note  Notified by [facility] manager of patient/family request for [facility] Palliative services at home after discharge.         Writer spoke with patient's daughter [name] to confirm interest and explain services.               [facility] Palliative team will follow up with patient after discharge.         Please call with any hospice or palliative related questions.         Thank you for the opportunity to participate in this pt's care.         Gillian Scarce, BSN, [employer] (in [place]) [phone]  [phone] (24h on call)

## 2020-03-05 NOTE — Care Management (Signed)
    Durable Medical Equipment  (From admission, onward)         Start     Ordered   03/05/20 1046  For home use only DME Hospital bed  Once    Comments: Call daughter Linzie Criss [phone] for delivery  Question Answer Comment  Length of Need Lifetime   Patient has (list medical condition): Unstageable sacral decubitus ulcer: Present on admission   The above medical condition requires: Patient requires the ability to reposition frequently   Head must be elevated greater than: 45 degrees   Bed type Semi-electric   Support Surface: Low Air loss Mattress      03/05/20 1046

## 2020-03-05 NOTE — Discharge Summary (Signed)
Physician Discharge Summary  Samantha Gonzales PPI:[id number] DOB: [birth year] DOA: 02/29/2020  PCP: Eartha Inch, MD  Admit date: 02/29/2020 Discharge date: 03/05/2020  Admitted From: Home Disposition: Home.  Patient/family refused SNF placement  Recommendations for Outpatient Follow-up:  1. Follow up with PCP in 1 week with repeat CBC/BMP 2. Outpatient follow-up with palliative care.  If condition worsens, consider home/residential hospice 3. Follow up in ED if symptoms worsen or new appear   Home Health: Home with PT/RN Equipment/Devices: None  Discharge Condition: Guarded to poor CODE STATUS: Full Diet recommendation: Regular  Brief/Interim Summary: 79 year old female with history of small bowel obstruction status post ex lap with lysis of adhesion and small bowel resection on 01/12/2020 with subsequent pelvic fluid collection/abscess which required IR guided percutaneous drainage on 01/30/2020 with cultures growing E. coli and Enterococcus for which she completed antibiotic treatment with Bactrim and Zyvox and subsequent drain removal; also was found to have unstageable pressure injury over the sacrum which required enzymatic debridement and subsequently was discharged on 02/03/2020 presented with generalized weakness, poor appetite and loss of weight.  She was treated with IV fluids.  Due to overall very poor prognosis, palliative care was consulted.  Palliative care has evaluated the patient and patient remains full code; palliative care recommends outpatient palliative care follow-up.  Vitamin B12 level was 173 for which she was treated with parenteral supplementation and will be discharged on oral vitamin B12.  PT recommended SNF placement.  Patient/family subsequently wanted to go home.  She will be discharged home with home health with outpatient follow-up with palliative care.  Overall prognosis is guarded to poor.  Discharge Diagnoses:   Acute kidney injury Very poor oral  intake -Baseline creatinine is around 0.4.  Presented with creatinine of 0.9.  Improving.  Treated with IV fluids.  IV fluids discontinued. -Encouraged the patient to increase oral intake.  Bactrim discontinued. -Currently afebrile and hemodynamically stable for discharge.  Outpatient follow-up  Failure to thrive Severe protein calorie malnutrition Weight loss -Unclear cause.  Follow nutrition recommendations. -Palliative care consult for goals of care discussion appreciated.  As per palliative care discussion, patient does not want artificial feeding tube but daughter wishes that her mother has artificial feeding tube.  Difficult situation; would not pursue artificial feeding tube unless patient wants it. -Recommend home/residential hospice if condition were to not improve -Currently remains full code.  Vitamin B12 deficiency -B12 level 173.  Currently on parenteral supplementation while patient is in the hospital. -Switch to oral vitamin B12 supplementation on discharge   unstageable sacral decubitus ulcer: Present on admission History of small bowel obstruction status post resection with subsequent pelvic abscess requiring IR guided drainage and antibiotic treatment -Currently afebrile with no leukocytosis.  No signs of infection of these ulcers as per wound care/general surgery evaluation. -General surgery evaluation appreciated.  General surgery has signed off.  Hypokalemia -Improved.  No labs today.  Macrocytic anemia -Hemoglobin stable.  Monitor as an outpatient.  Essential hypertension -Outpatient follow-up.  Continue amlodipine  Goals of care -Overall prognosis is guarded to poor as patient has obvious failure to thrive with loss of weight and poor oral intake.  Palliative care following.  Recommend palliative care as an outpatient versus home/residential hospice if condition were to deteriorate. -PT/OT recommend SNF placement. Patient/family refused SNF  placement  Discharge Instructions   Allergies as of 03/05/2020      Reactions   Penicillins Hives, Itching, Rash   Has patient had a PCN reaction  causing immediate rash, facial/tongue/throat swelling, SOB or lightheadedness with hypotension: Yes Has patient had a PCN reaction causing severe rash involving mucus membranes or skin necrosis: yes Has patient had a PCN reaction that required hospitalization: unk Has patient had a PCN reaction occurring within the last 10 years: unk If all of the above answers are "NO", then may proceed with Cephalosporin use.      Medication List    STOP taking these medications   docusate sodium 100 MG capsule Commonly known as: Colace   feeding supplement (ENSURE ENLIVE) Liqd   sodium chloride flush 0.9 % Soln Commonly known as: NS   sulfamethoxazole-trimethoprim 800-160 MG tablet Commonly known as: BACTRIM DS     TAKE these medications   acetaminophen 500 MG tablet Commonly known as: TYLENOL Take 2 tablets (1,000 mg total) by mouth every 6 (six) hours as needed for moderate pain.   amLODipine 10 MG tablet Commonly known as: NORVASC Take 10 mg by mouth daily.   collagenase ointment Commonly known as: SANTYL Apply topically daily. What changed: how much to take   ferrous sulfate 325 (65 FE) MG tablet Take 1 tablet (325 mg total) by mouth daily with breakfast.   folic acid 1 MG tablet Commonly known as: FOLVITE Take 1 tablet (1 mg total) by mouth daily.   gabapentin 100 MG capsule Commonly known as: NEURONTIN Take 2 capsules (200 mg total) by mouth 3 (three) times daily.   ibuprofen 200 MG tablet Commonly known as: ADVIL You can take 2 tablets every 6 hours as needed for pain.  Follow package directions.  You can buy this over-the-counter at any drugstore.   megestrol 40 MG tablet Commonly known as: MEGACE Take 4 tablets (160 mg total) by mouth daily.   multivitamin with minerals Tabs tablet Take 1 tablet by mouth daily.    Oxycodone HCl 10 MG Tabs Take 0.5-1 tablets (5-10 mg total) by mouth every 6 (six) hours as needed for severe pain. What changed:   how much to take  reasons to take this   polyethylene glycol 17 g packet Commonly known as: MIRALAX / GLYCOLAX Take 17 g by mouth daily as needed for mild constipation.   rosuvastatin 20 MG tablet Commonly known as: CRESTOR Take 20 mg by mouth daily.   vitamin B-12 1000 MCG tablet Commonly known as: CYANOCOBALAMIN Take 1 tablet (1,000 mcg total) by mouth daily.            Durable Medical Equipment  (From admission, onward)         Start     Ordered   03/05/20 1046  For home use only DME Hospital bed  Once    Comments: Call daughter Adanely Reynoso [phone] for delivery  Question Answer Comment  Length of Need Lifetime   Patient has (list medical condition): Unstageable sacral decubitus ulcer: Present on admission   The above medical condition requires: Patient requires the ability to reposition frequently   Head must be elevated greater than: 45 degrees   Bed type Semi-electric   Support Surface: Low Air loss Mattress      03/05/20 1046          Follow-up Information    Eartha Inch, MD. Schedule an appointment as soon as possible for a visit in 1 week(s).   Specialty: Family Medicine Contact information: [address] [place] [address] [phone]        Palliative care. Schedule an appointment as soon as  possible for a visit in 1 week(s).          Allergies  Allergen Reactions  . Penicillins Hives, Itching and Rash    Has patient had a PCN reaction causing immediate rash, facial/tongue/throat swelling, SOB or lightheadedness with hypotension: Yes Has patient had a PCN reaction causing severe rash involving mucus membranes or skin necrosis: yes Has patient had a PCN reaction that required hospitalization: unk Has patient had a PCN reaction occurring within the last 10 years: unk If all of the above  answers are "NO", then may proceed with Cephalosporin use.     Consultations: General surgery/palliative care   Procedures/Studies: CT ABDOMEN PELVIS WO CONTRAST  Result Date: 02/14/2020 CLINICAL DATA:  History of exploratory laparotomy with bowel resection and lysis of adhesions due to small bowel obstruction on 01/12/2020. Patient developed a postoperative pelvic abscess that was treated with percutaneous drain on 01/30/2020. Previous ERCP with sphincterotomy. Post-contrast CT could not be performed today due to poor venous access. EXAM: CT ABDOMEN AND PELVIS WITHOUT CONTRAST TECHNIQUE: Multidetector CT imaging of the abdomen and pelvis was performed following the standard protocol without IV contrast. COMPARISON:  CT abdomen and pelvis 05/31/2020 FINDINGS: Lower chest: Lung bases are clear without pleural effusions. Hepatobiliary: Small amount of pneumobilia compatible with previous sphincterotomy procedure. Cholecystectomy. Again noted is a bilobed cyst in the central aspect of the liver with a combined size of 3.0 cm. Pancreas: Unremarkable. No pancreatic ductal dilatation or surrounding inflammatory changes. Spleen: Normal in size without focal abnormality. Adrenals/Urinary Tract: Adrenal glands are poorly characterized on this noncontrast examination. Again noted are bilateral renal cysts without hydronephrosis. Small amount of fluid in the urinary bladder. Stomach/Bowel: Large amount of stool in the rectum. Gas and stool throughout the colon. Surgical bowel clips in the central lower abdomen and upper pelvic region. Fluid-filled loops of small bowel in left lower abdomen but no evidence for an obstructive process. Vascular/Lymphatic: Abdominal aorta is diffusely calcified without aneurysm. No significant abdominal or pelvic lymphadenopathy. However, there is a soft tissue nodular structure in the right lower abdomen which is similar to the recent comparison examination. This area measures up to  1.6 cm. This could represent a prominent mesenteric lymph node but indeterminate. Reproductive: Poorly characterized on this noncontrast examination. Other: Negative for ascites. Negative for free intraperitoneal air. Focal soft tissue thickening along the anterior abdominal wall near the umbilicus is similar to the previous examination and could represent postoperative changes. The large pelvic fluid collection has resolved following placement of the right transgluteal percutaneous drain. Drainage catheter is positioned in the central aspect of the pelvis. No large residual fluid collections in the pelvis although limited evaluation on this noncontrast examination. Musculoskeletal: There is small amount of gas in the subcutaneous tissues just superficial to the coccyx. Patient has a known decubitus ulceration in this area. Multilevel degenerative facet disease in the lumbar spine. Mild anterolisthesis in lower lumbar spine. Bony cortex of the coccyx adjacent to the decubitus ulceration is grossly intact. IMPRESSION: 1. Large pelvic abscess has resolved with the percutaneous drain. No new large abdominopelvic fluid collections. 2. Large amount of stool throughout the colon, particularly the rectum. Findings are suggestive for constipation. 3. Indeterminate nodule in the right lower quadrant measuring 1.6 cm. This is similar to the recent exam on 01/30/2020. This could represent a prominent mesenteric lymph node. Consider a 3-6 month follow-up CT with IV and oral contrast to ensure stability. 4. Decubitus ulceration adjacent to the coccyx. 5. Postoperative  changes in the abdomen. No evidence for a bowel obstruction. Electronically Signed   By: Markus Daft M.D.   On: 02/14/2020 12:22   Abd 1 View (KUB)  Result Date: 02/29/2020 CLINICAL DATA:  Malnutrition. EXAM: ABDOMEN - 1 VIEW COMPARISON:  January 14, 2020. FINDINGS: The bowel gas pattern is normal. No radio-opaque calculi or other significant radiographic  abnormality are seen. IMPRESSION: No evidence of bowel obstruction or ileus. Electronically Signed   By: Marijo Conception M.D.   On: 02/29/2020 19:24   DG Sinus/Fist Tube Chk-Non GI  Result Date: 02/14/2020 INDICATION: 79 year old with history of abdominal surgery and postoperative abscess. Abscess was treated with a percutaneous drain. Minimal bloody output from the drain. EXAM: DRAIN INJECTION WITH FLUOROSCOPY DRAIN REMOVAL MEDICATIONS: None ANESTHESIA/SEDATION: None COMPLICATIONS: None immediate. PROCEDURE: Patient was placed prone on the fluoroscopic table. Scout image was obtained. Right transgluteal drain was injected with 10 mL Omnipaque 300. Majority of the contrast was aspirated at the end of the procedure. Retention suture was cut. The catheter was cut and completely removed without complication. Small dressing was placed over the old drain site. In addition, the patient has a decubitus ulceration overlying the coccyx and a new overlying bandage was placed. FINDINGS: Drain injection demonstrates a small residual collection around the drain. No evidence for a bowel fistula. Majority of the contrast was aspirated at the end of the procedure. IMPRESSION: 1. Large pelvic abscess collection has resolved based on the recent CT. No evidence for a bowel fistula on the drain injection. 2. Drain was removed. Electronically Signed   By: Markus Daft M.D.   On: 02/14/2020 12:48   IR Radiologist Eval & Mgmt  Result Date: 02/14/2020 Please refer to notes tab for details about interventional procedure. (Op Note)      Subjective: Patient seen and examined at bedside.  Poor historian.  No overnight fever or vomiting reported.  Discharge Exam: Vitals:   03/04/20 2140 03/05/20 0435  BP: (!) 125/54 (!) 117/43  Pulse: (!) 104 79  Resp: 18 17  Temp: 100.1 F (37.8 C) 98.7 F (37.1 C)  SpO2: 100% 100%    General exam: No distress.  Looks chronically ill.  Slightly confused to time.  Respiratory system:  Bilateral decreased breath sounds at bases with some scattered crackles  cardiovascular system: Intermittently tachypneic, S1-S2 heard Gastrointestinal system: Abdomen is nondistended, soft and minimally tender on the left of midline; midline wound is almost completely healed.  Bowel sounds are heard  extremities: No clubbing or edema.   The results of significant diagnostics from this hospitalization (including imaging, microbiology, ancillary and laboratory) are listed below for reference.     Microbiology: No results found for this or any previous visit (from the past 240 hour(s)).   Labs: BNP (last 3 results) No results for input(s): BNP in the last 8760 hours. Basic Metabolic Panel: Recent Labs  Lab 02/29/20 1036 03/01/20 0730 03/02/20 0323  NA 142 141 140  K 2.4* 3.1* 3.5  CL 108 107 112*  CO2 20* 21* 22  GLUCOSE 87 82 91  BUN 10 10 7*  CREATININE 0.90 0.78 0.74  CALCIUM 9.4 8.9 8.4*  MG  --   --  1.8   Liver Function Tests: Recent Labs  Lab 02/29/20 1036 03/02/20 0323  AST 15 12*  ALT 12 9  ALKPHOS 69 51  BILITOT 0.6 0.4  PROT 5.9* 5.0*  ALBUMIN 2.9* 2.3*   Recent Labs  Lab 02/29/20 1124  LIPASE  21   Recent Labs  Lab 03/02/20 0323  AMMONIA 36*   CBC: Recent Labs  Lab 02/29/20 1036 03/01/20 0730 03/02/20 0323  WBC 10.6* 9.9 7.9  NEUTROABS 9.0*  --  6.2  HGB 11.7* 10.2* 8.8*  HCT 35.6* 30.8* 26.6*  MCV 104.1* 103.0* 103.1*  PLT 309 276 231   Cardiac Enzymes: No results for input(s): CKTOTAL, CKMB, CKMBINDEX, TROPONINI in the last 168 hours. BNP: Invalid input(s): POCBNP CBG: No results for input(s): GLUCAP in the last 168 hours. D-Dimer No results for input(s): DDIMER in the last 72 hours. Hgb A1c No results for input(s): HGBA1C in the last 72 hours. Lipid Profile No results for input(s): CHOL, HDL, LDLCALC, TRIG, CHOLHDL, LDLDIRECT in the last 72 hours. Thyroid function studies No results for input(s): TSH, T4TOTAL, T3FREE,  THYROIDAB in the last 72 hours.  Invalid input(s): FREET3 Anemia work up No results for input(s): VITAMINB12, FOLATE, FERRITIN, TIBC, IRON, RETICCTPCT in the last 72 hours. Urinalysis    Component Value Date/Time   COLORURINE YELLOW 01/29/2020 2244   APPEARANCEUR HAZY (A) 01/29/2020 2244   LABSPEC 1.030 01/29/2020 2244   PHURINE 5.0 01/29/2020 2244   GLUCOSEU NEGATIVE 01/29/2020 2244   HGBUR MODERATE (A) 01/29/2020 2244   BILIRUBINUR NEGATIVE 01/29/2020 2244   KETONESUR 5 (A) 01/29/2020 2244   PROTEINUR 30 (A) 01/29/2020 2244   NITRITE NEGATIVE 01/29/2020 2244   LEUKOCYTESUR SMALL (A) 01/29/2020 2244   Sepsis Labs Invalid input(s): PROCALCITONIN,  WBC,  LACTICIDVEN Microbiology No results found for this or any previous visit (from the past 240 hour(s)).   Time coordinating discharge: 35 minutes  SIGNED:   Glade LloydKshitiz Elyce Zollinger, MD  [facility] 03/05/2020, 10:42 AM

## 2020-03-05 NOTE — Care Management Important Message (Signed)
Important Message  Patient Details  Name: Samantha Gonzales MRN: [id number] Date of Birth: 1941-02-27   Medicare Important Message Given:  Yes     Dorena Bodo 03/05/2020, 3:08 PM

## 2020-03-05 NOTE — Care Management (Addendum)
14200 Spoke with [name] , she gets off work at 1700 and will be at hospital by 1730. [name] aware [facility] has accepted referral. [place] with [facility] working on start of care date. [name] aware home health is unable to make daily visits. Bedside nurse will provide wound care education with [name] when she arrives. [name] planning on taking her mother home in private car. [name] will check with her mother regarding card number and call [facility] back for delivery of DME.     1350 Received a message from Zac with [facility]. [facility] called patient's daughter [name] for delivery of DME. [facility] waiting on [name] to call back with a card for [facility] to put on file.   Tiffany with [facility] unable to take referral.  Kandee Keen with [facility] unable to take referral.    Awaiting call back from [place] with [facility]     Patient has unstageable sacral wound. Spoke with daughter Sehaj Kolden [phone] regarding hospital bed and low air loss mattress. [name] in agreement for both. Confirmed face sheet address with [name] for delivery. [name] is contact person for delivery also.   Ordered with Zac with [facility].  Ronny Flurry RN

## 2020-03-05 NOTE — Progress Notes (Signed)
Patient's daughter arrived at [facility] around 1920 and teaching on wound dressing changes on sacral pressure ulcer and abdominal wound was done.  [name], patient's daughter verbalized understanding on wound dressing changes and discharge instructions.  Patient discharge to home via wheelchair accompanied by patient's daughter.

## 2020-03-05 NOTE — Progress Notes (Signed)
Patient ID: Samantha Gonzales, female   DOB: Jul 13, 1941, 79 y.o.   MRN: [id number]  PROGRESS NOTE    Samantha Gonzales  QIH:[id number] DOB: May 18, 1941 DOA: 02/29/2020 PCP: Eartha Inch, MD   Brief Narrative:  79 year old female with history of small bowel obstruction status post ex lap with lysis of adhesion and small bowel resection on 01/12/2020 with subsequent pelvic fluid collection/abscess which required IR guided percutaneous drainage on 01/30/2020 with cultures growing E. coli and Enterococcus for which she completed antibiotic treatment with Bactrim and Zyvox and subsequent drain removal; also was found to have unstageable pressure injury over the sacrum which required enzymatic debridement and subsequently was discharged on 02/03/2020 presented with generalized weakness, poor appetite and loss of weight.  Assessment & Plan:   Acute kidney injury Very poor oral intake -Baseline creatinine is around 0.4.  Presented with creatinine of 0.9.  Improving.  Treated with IV fluids.  IV fluids discontinued. -Encouraged the patient to increase oral intake.  Bactrim discontinued. -Currently afebrile and hemodynamically stable for discharge to SNF.  Failure to thrive Severe protein calorie malnutrition Weight loss -Unclear cause.  Follow nutrition recommendations. -Palliative care consult for goals of care discussion appreciated.  As per palliative care discussion, patient does not want artificial feeding tube but daughter wishes that her mother has artificial feeding tube.  Difficult situation; would not pursue artificial feeding tube unless patient wants it. -Recommend home/residential hospice if condition were to not improve -Currently remains full code.  Vitamin B12 deficiency -B12 level 173.  Continue parenteral supplementation while patient is in the hospital and switch to oral once she is ready to be discharged.  Unstageable sacral decubitus ulcer: Present on admission History of small bowel  obstruction status post resection with subsequent pelvic abscess requiring IR guided drainage and antibiotic treatment -Currently afebrile with no leukocytosis.  No signs of infection of these ulcers as per wound care/general surgery evaluation. -General surgery evaluation appreciated.  General surgery has signed off.  Hypokalemia -Improved.  No labs today.  Macrocytic anemia -Hemoglobin stable.  Monitor intermittently  Essential hypertension -Monitor blood pressure.  Continue amlodipine  Goals of care -Overall prognosis is guarded to poor as patient has obvious failure to thrive with loss of weight and poor oral intake.  Palliative care following.  Recommend palliative care as an outpatient versus home hospice if patient/family agreeable -PT/OT recommend SNF placement.  Social worker consulted.  DVT prophylaxis: Lovenox Code Status: Full Family Communication: None at bedside Disposition Plan: Status is: Inpatient  Patient is extremely deconditioned and needs SNF placement.  She is medically stable for discharge to SNF whenever the bed is available.  Dispo: The patient is from: Home              Anticipated d/c is to: SNF              Anticipated d/c date is: As soon as bed is available              Patient currently is medically stable to d/c.  Consultants: General surgery/palliative care  Procedures: None  Antimicrobials: Bactrim discontinued on 03/01/2020   Subjective: Patient seen and examined at bedside.  Poor historian.  No overnight fever or vomiting reported. Objective: Vitals:   03/04/20 0620 03/04/20 1454 03/04/20 2140 03/05/20 0435  BP: 132/64 (!) 127/59 (!) 125/54 (!) 117/43  Pulse: 76 89 (!) 104 79  Resp: 14 14 18 17   Temp: 98.1 F (36.7 C) 98.3 F (36.8 C)  100.1 F (37.8 C) 98.7 F (37.1 C)  TempSrc: Oral Oral Oral Oral  SpO2: 100% 100% 100% 100%  Weight:      Height:        Intake/Output Summary (Last 24 hours) at 03/05/2020 0825 Last data filed at  03/04/2020 1300 Gross per 24 hour  Intake 477 ml  Output --  Net 477 ml   Filed Weights   02/29/20 1022  Weight: 50.3 kg    Examination:  General exam: No distress.  Looks chronically ill.  Slightly confused to time.  Respiratory system: Bilateral decreased breath sounds at bases with some scattered crackles  cardiovascular system: Intermittently tachypneic, S1-S2 heard Gastrointestinal system: Abdomen is nondistended, soft and minimally tender on the left of midline; midline wound is almost completely healed.  Bowel sounds are heard  extremities: No clubbing or edema.  Data Reviewed: I have personally reviewed following labs and imaging studies  CBC: Recent Labs  Lab 02/29/20 1036 03/01/20 0730 03/02/20 0323  WBC 10.6* 9.9 7.9  NEUTROABS 9.0*  --  6.2  HGB 11.7* 10.2* 8.8*  HCT 35.6* 30.8* 26.6*  MCV 104.1* 103.0* 103.1*  PLT 309 276 220   Basic Metabolic Panel: Recent Labs  Lab 02/29/20 1036 03/01/20 0730 03/02/20 0323  NA 142 141 140  K 2.4* 3.1* 3.5  CL 108 107 112*  CO2 20* 21* 22  GLUCOSE 87 82 91  BUN 10 10 7*  CREATININE 0.90 0.78 0.74  CALCIUM 9.4 8.9 8.4*  MG  --   --  1.8   GFR: Estimated Creatinine Clearance: 47.5 mL/min (by C-G formula based on SCr of 0.74 mg/dL). Liver Function Tests: Recent Labs  Lab 02/29/20 1036 03/02/20 0323  AST 15 12*  ALT 12 9  ALKPHOS 69 51  BILITOT 0.6 0.4  PROT 5.9* 5.0*  ALBUMIN 2.9* 2.3*   Recent Labs  Lab 02/29/20 1124  LIPASE 21   Recent Labs  Lab 03/02/20 0323  AMMONIA 36*   Coagulation Profile: No results for input(s): INR, PROTIME in the last 168 hours. Cardiac Enzymes: No results for input(s): CKTOTAL, CKMB, CKMBINDEX, TROPONINI in the last 168 hours. BNP (last 3 results) No results for input(s): PROBNP in the last 8760 hours. HbA1C: No results for input(s): HGBA1C in the last 72 hours. CBG: No results for input(s): GLUCAP in the last 168 hours. Lipid Profile: No results for input(s):  CHOL, HDL, LDLCALC, TRIG, CHOLHDL, LDLDIRECT in the last 72 hours. Thyroid Function Tests: No results for input(s): TSH, T4TOTAL, FREET4, T3FREE, THYROIDAB in the last 72 hours. Anemia Panel: No results for input(s): VITAMINB12, FOLATE, FERRITIN, TIBC, IRON, RETICCTPCT in the last 72 hours. Sepsis Labs: No results for input(s): PROCALCITON, LATICACIDVEN in the last 168 hours.  No results found for this or any previous visit (from the past 240 hour(s)).       Radiology Studies: No results found.      Scheduled Meds: . amLODipine  10 mg Oral Daily  . collagenase   Topical Daily  . cyanocobalamin  1,000 mcg Intramuscular Daily  . enoxaparin (LOVENOX) injection  40 mg Subcutaneous Q24H  . feeding supplement (PRO-STAT SUGAR FREE 64)  30 mL Oral TID  . ferrous sulfate  325 mg Oral Q breakfast  . folic acid  1 mg Oral Daily  . gabapentin  200 mg Oral TID  . megestrol  160 mg Oral Daily  . multivitamin with minerals  1 tablet Oral Daily  . potassium chloride  40  mEq Oral BID  . rosuvastatin  20 mg Oral Daily  . sodium chloride flush  3 mL Intravenous Once   Continuous Infusions:         Glade Lloyd, MD [facility] 03/05/2020, 8:25 AM

## 2020-03-05 NOTE — Plan of Care (Signed)
  Problem: Clinical Measurements: Goal: Ability to maintain clinical measurements within normal limits will improve Outcome: Progressing   Problem: Activity: Goal: Risk for activity intolerance will decrease Outcome: Progressing   Problem: Nutrition: Goal: Adequate nutrition will be maintained Outcome: Progressing   Problem: Coping: Goal: Level of anxiety will decrease Outcome: Progressing   Problem: Elimination: Goal: Will not experience complications related to urinary retention Outcome: Progressing   Problem: Pain Managment: Goal: General experience of comfort will improve Outcome: Progressing   Problem: Safety: Goal: Ability to remain free from injury will improve Outcome: Progressing

## 2020-03-05 NOTE — TOC Progression Note (Addendum)
Transition of Care [facility]) - Progression Note    Patient Details  Name: Samantha Gonzales MRN: [id number] Date of Birth: 1940-11-04  Transition of [facility]) CM/SW Contact  Doy Hutching, [place] Phone Number: 03/05/2020, 10:33 AM  Clinical Narrative:    10:57am- Pt referral declined by Frances [facility]urbish, referral made with [facility] for PT/RN. Referral accepted by Victorino Dike with [facility].   10:33am- CSW reached pt daughter Samantha Gonzales at [phone]. CSW spoke with Samantha Gonzales about recs and our conversation on Friday. Pt daughter interested in bringing pt home- she has a bedside commode and walker. She states she will transport pt in personal vehicle. Pt daughter interested in [place] for [facility] care. RNCM to look into DME (hospital bed/air mattress). We also discussed PMT recs and pt daughter is open to referral with [facility]- choice given, pt family pref due to proximity.   Referral made to [facility], referral made to [facility] with [employer].    Expected Discharge Plan: Home w Home Health Services(& Palliative [facility]) Barriers to Discharge: Continued Medical Work up, English as a second language teacher  Expected Discharge Plan and Services Expected Discharge Plan: Home w Home Health Services(& Palliative [facility]) In-house Referral: Clinical Social Work Discharge Planning Services: CM Consult Post Acute Care Choice: Home Health(palliative) Living arrangements for the past 2 months: Single Family Home HH Arranged: PT, OT, RN  Readmission Risk Interventions Readmission Risk Prevention Plan 03/02/2020 02/03/2020  Transportation Screening Complete -  PCP or Specialist Appt within 3-5 Days Not Complete Complete  Not Complete comments poss plan for SNF -  HRI or Home Care Consult Complete Complete  Social Work Consult for Recovery Care Planning/Counseling Complete Complete  Palliative Care Screening Complete Not Applicable  Medication Review [employer]) Referral to Pharmacy Complete  Some recent data might be  hidden

## 2020-03-06 ENCOUNTER — Telehealth: Payer: Self-pay | Admitting: Hospice

## 2020-03-15 ENCOUNTER — Telehealth: Payer: Self-pay | Admitting: Hospice

## 2020-03-15 NOTE — Telephone Encounter (Signed)
Called patient's home number (which is the same as the daughter's) to schedule the Palliative Consult, no answer and unable to leave message due to no voicemail set up.  I then called daughter's cell number, no answer - left message with reason for call along with my name and contact number.

## 2020-03-23 ENCOUNTER — Telehealth: Payer: Self-pay | Admitting: Hospice

## 2020-04-12 ENCOUNTER — Telehealth: Payer: Self-pay | Admitting: Hospice

## 2020-04-12 NOTE — Telephone Encounter (Signed)
Called patient's daughter, [name], to offer to schedule the Palliative Consult, no answer.  Left message requesting a call back to let us know if they want to schedule the visit or if they would like to cancel the referral.  Left my name and contact number.

## 2020-04-17 ENCOUNTER — Telehealth: Payer: Self-pay | Admitting: Hospice

## 2020-04-17 NOTE — Telephone Encounter (Signed)
Spoke with daughter regarding scheduling the Palliative Consult and she requested that I call her back on Monday, 04/23/20 - she was at work at the time that I called  and said she would be available to discuss this with me on Monday.

## 2020-04-25 ENCOUNTER — Telehealth: Payer: Self-pay | Admitting: Hospice

## 2020-04-25 NOTE — Telephone Encounter (Signed)
Rec'd message from Triage stating that the daughter, [name], had returned my call and said that her mother is doing much better right now and doesn't need Palliative Care Services.  I will cancel the referral and discharge patient and notify MD as well.

## 2020-04-25 NOTE — Telephone Encounter (Signed)
Called daughter's cell to schedule the Palliative Consult, no answer - left message with my name and contact number requesting a call back.

## 2020-12-31 IMAGING — CT CT ABD-PELV W/ CM
2 of 5 series · 15 of 46 positions shown, 17 images · IV contrast (Omni 300)
Comparison: CT abdomen pelvis 09/28/2015; CT abdomen pelvis
02/20/2011

CLINICAL DATA: Severe right lower quadrant abdominal pain.

EXAM:
CT ABDOMEN AND PELVIS WITH CONTRAST
TECHNIQUE: Multidetector CT imaging of the abdomen and pelvis was performed
using the standard protocol following bolus administration of
intravenous contrast.
CONTRAST:  100mL OMNIPAQUE IOHEXOL 300 MG/ML  SOLN

[Series 3: a/p w/ 5mm · axial · 0.70mm/px · z∈[-660,-260]mm · 12 of 92 slices shown, 14 images]
[im 6/92  soft-tissue]
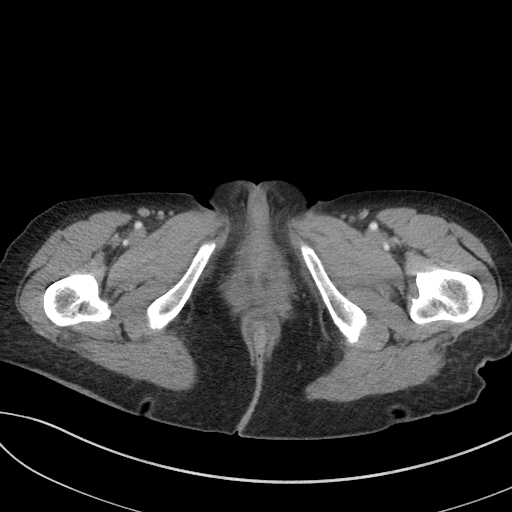
[im 6/92  bone]
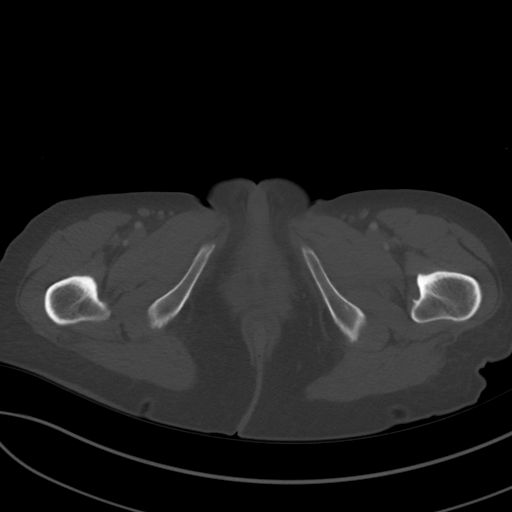
[im 16/92  soft-tissue]
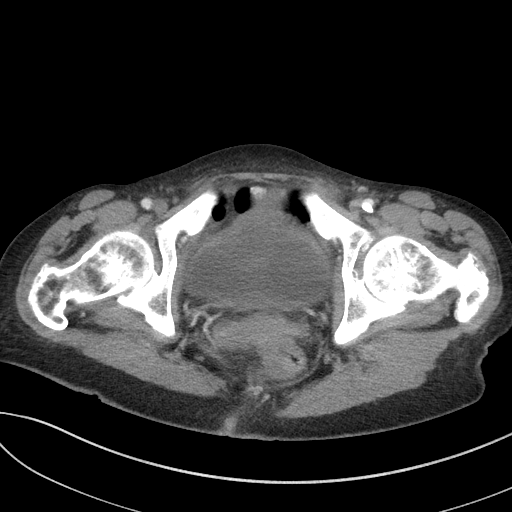
[im 21/92  soft-tissue]
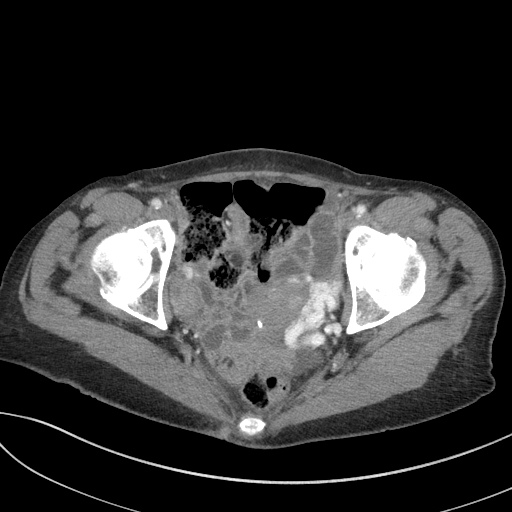
[im 26/92  soft-tissue]
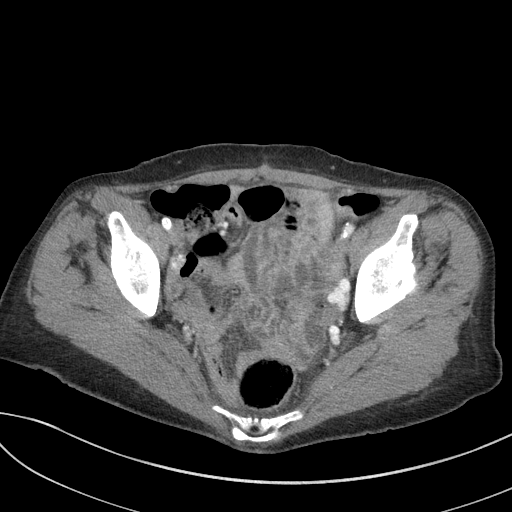
[im 36/92  soft-tissue]
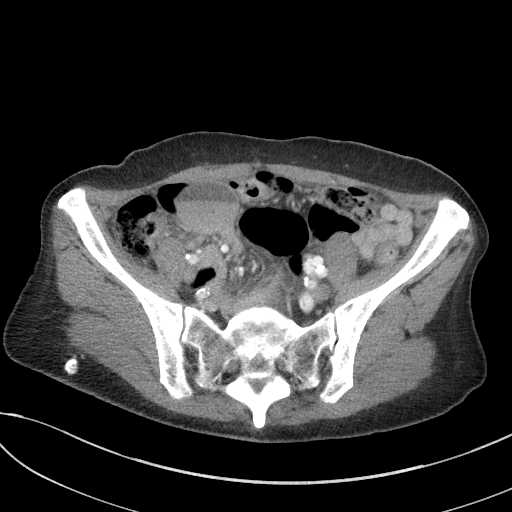
[im 41/92  soft-tissue]
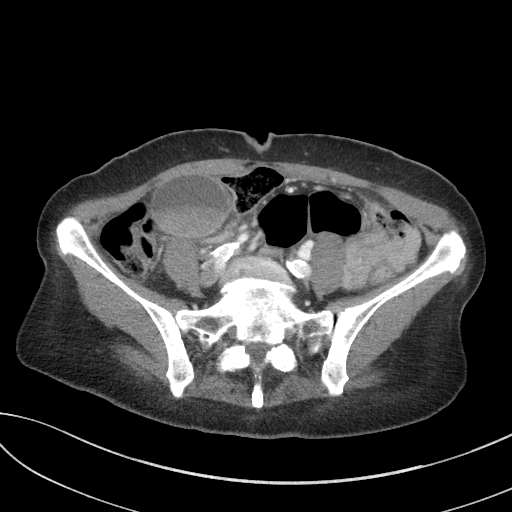
[im 51/92  soft-tissue]
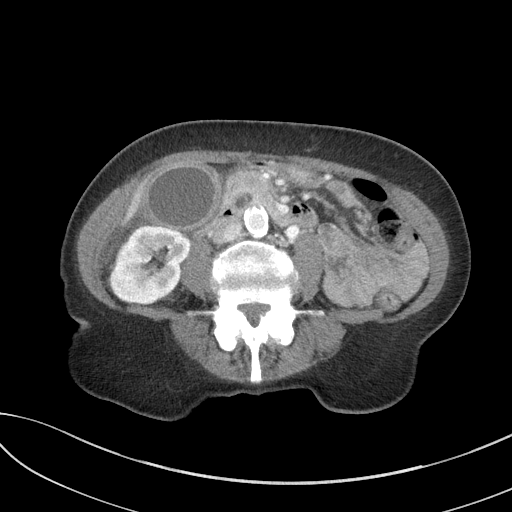
[im 56/92  soft-tissue]
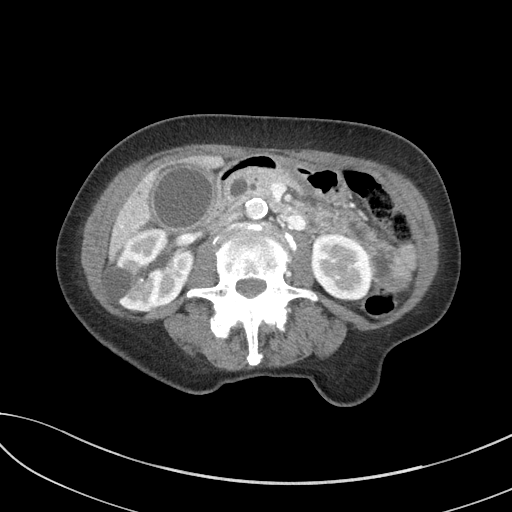
[im 66/92  soft-tissue]
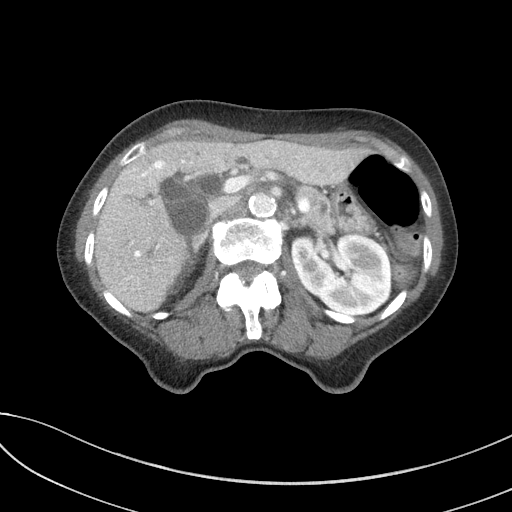
[im 66/92  bone]
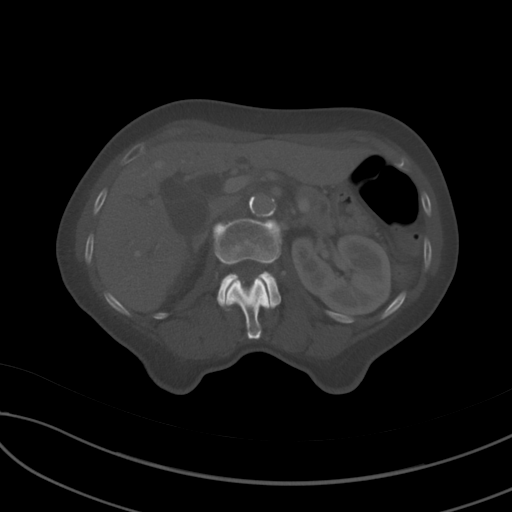
[im 71/92  soft-tissue]
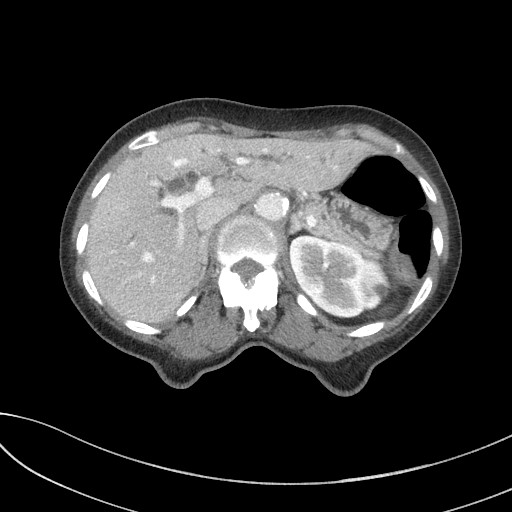
[im 76/92  soft-tissue]
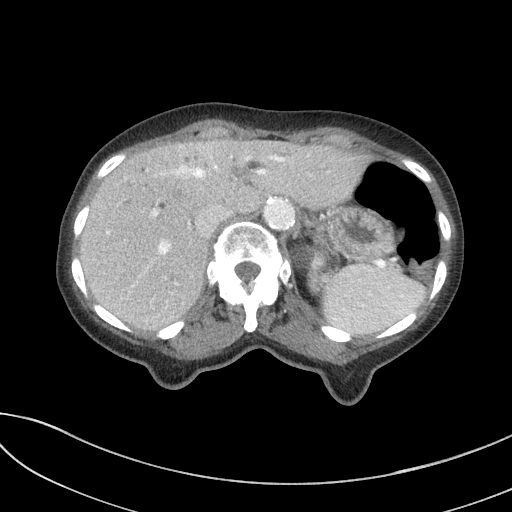
[im 86/92  soft-tissue]
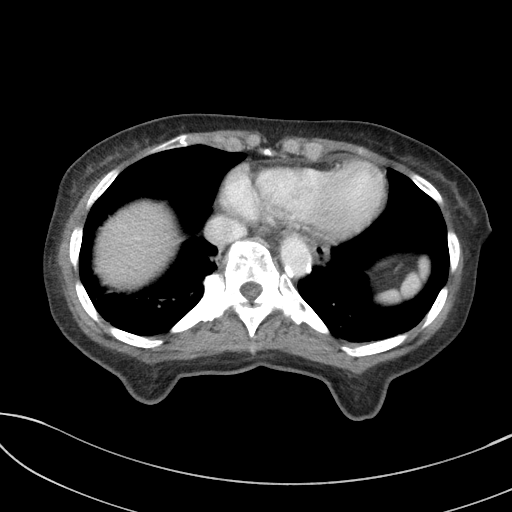

[Series 6: a/p w/ cor · coronal · 0.74mm/px · 3 of 113 slices shown]
[im 38/113  soft-tissue]
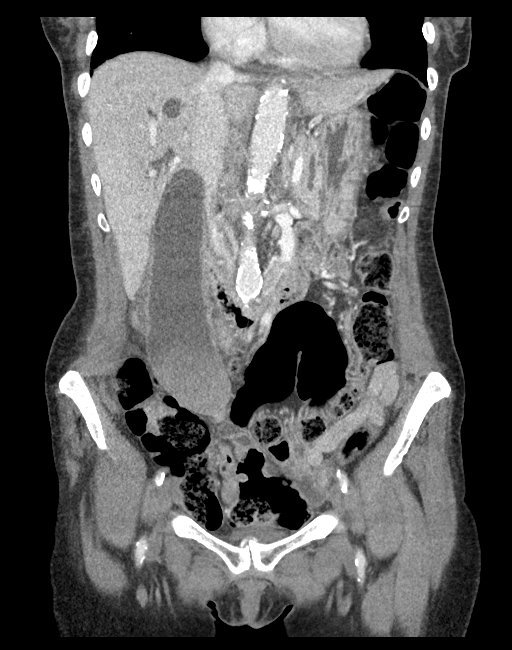
[im 50/113  soft-tissue]
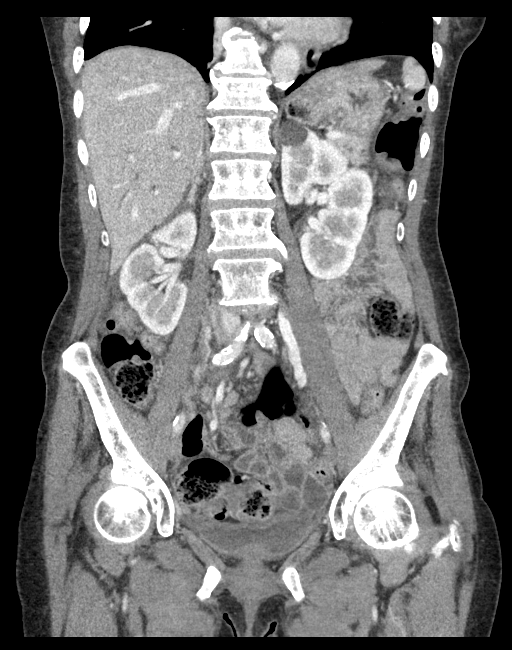
[im 63/113  soft-tissue]
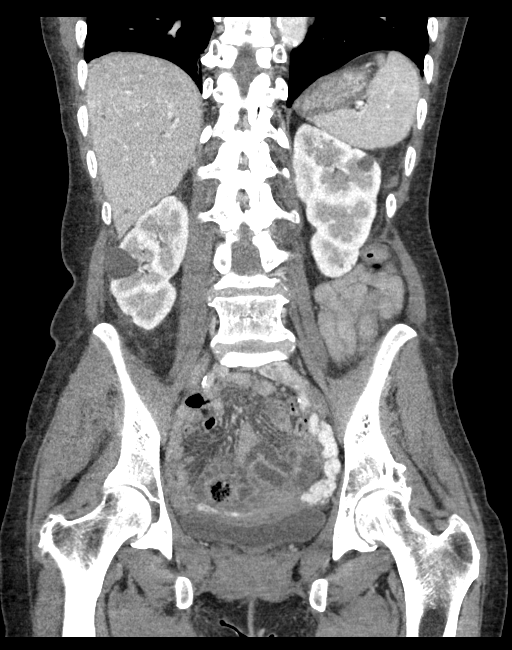

[15 of 46 positions shown; findings below may reference images not displayed]

FINDINGS: Lower chest: Normal heart size. Dependent atelectasis bilateral
lower lobes. No pleural effusion.

Hepatobiliary: Liver is normal in size and contour. Stable cyst
within the central aspect of the liver. Persistent intrahepatic and
extrahepatic biliary ductal dilatation. Common bile duct measures 9
mm distally (image 39; series 3). The gallbladder is markedly
distended measuring up to 18 cm in craniocaudal dimension. There is
mild wall thickening of the mid aspect of the gallbladder. Sludge
within the gallbladder lumen.

Pancreas: Similar-appearing prominence of the pancreatic duct. No
inflammatory change.

Spleen: Unremarkable

Adrenals/Urinary Tract: Normal adrenal glands. Bilateral renal cyst.
Kidneys enhance symmetrically with contrast. No hydronephrosis.
Urinary bladder is unremarkable.

Stomach/Bowel: No abnormal bowel wall thickening or evidence for
bowel obstruction. Normal appendix. No free fluid or free
intraperitoneal air.

Vascular/Lymphatic: Normal caliber abdominal aorta. Peripheral
calcified atherosclerotic plaque. No retroperitoneal
lymphadenopathy.

Reproductive: Reflux of contrast into the left gonadal veins.

Other: None.

Musculoskeletal: Left hip joint degenerative changes. Lumbar spine
degenerative changes. No aggressive or acute appearing osseous
lesions.
IMPRESSION: 1. The gallbladder is hydropic and markedly distended measuring up
to 18 cm, extending caudally into the lower abdomen. There is some
wall thickening of the mid aspect of the gallbladder. Findings raise
the possibility of associated superimposed acute cholecystitis.
2. There is persistent intrahepatic and extrahepatic biliary ductal
dilatation which is similar when compared to CT 02/14/2011.
[DATE]. These results were called by telephone at the time of
interpretation on 09/04/2019 at [DATE] to provider MARDIS
AISATHUSAAMIYAA , who verbally acknowledged these results.

## 2021-01-03 IMAGING — RF DG ERCP WO/W SPHINCTEROTOMY
1 series · 3 of 3 positions shown · non-contrast
Comparison: Intra op cholangiogram 09/05/2019

CLINICAL DATA: Choledocholithiasis, sphincterotomy

EXAM:
ERCP
TECHNIQUE: Multiple spot images obtained with the fluoroscopic device and
submitted for interpretation post-procedure.

[Series 1: unknown protocol · 0.20mm/px · 3 of 3 slices shown]
[im 1/3]
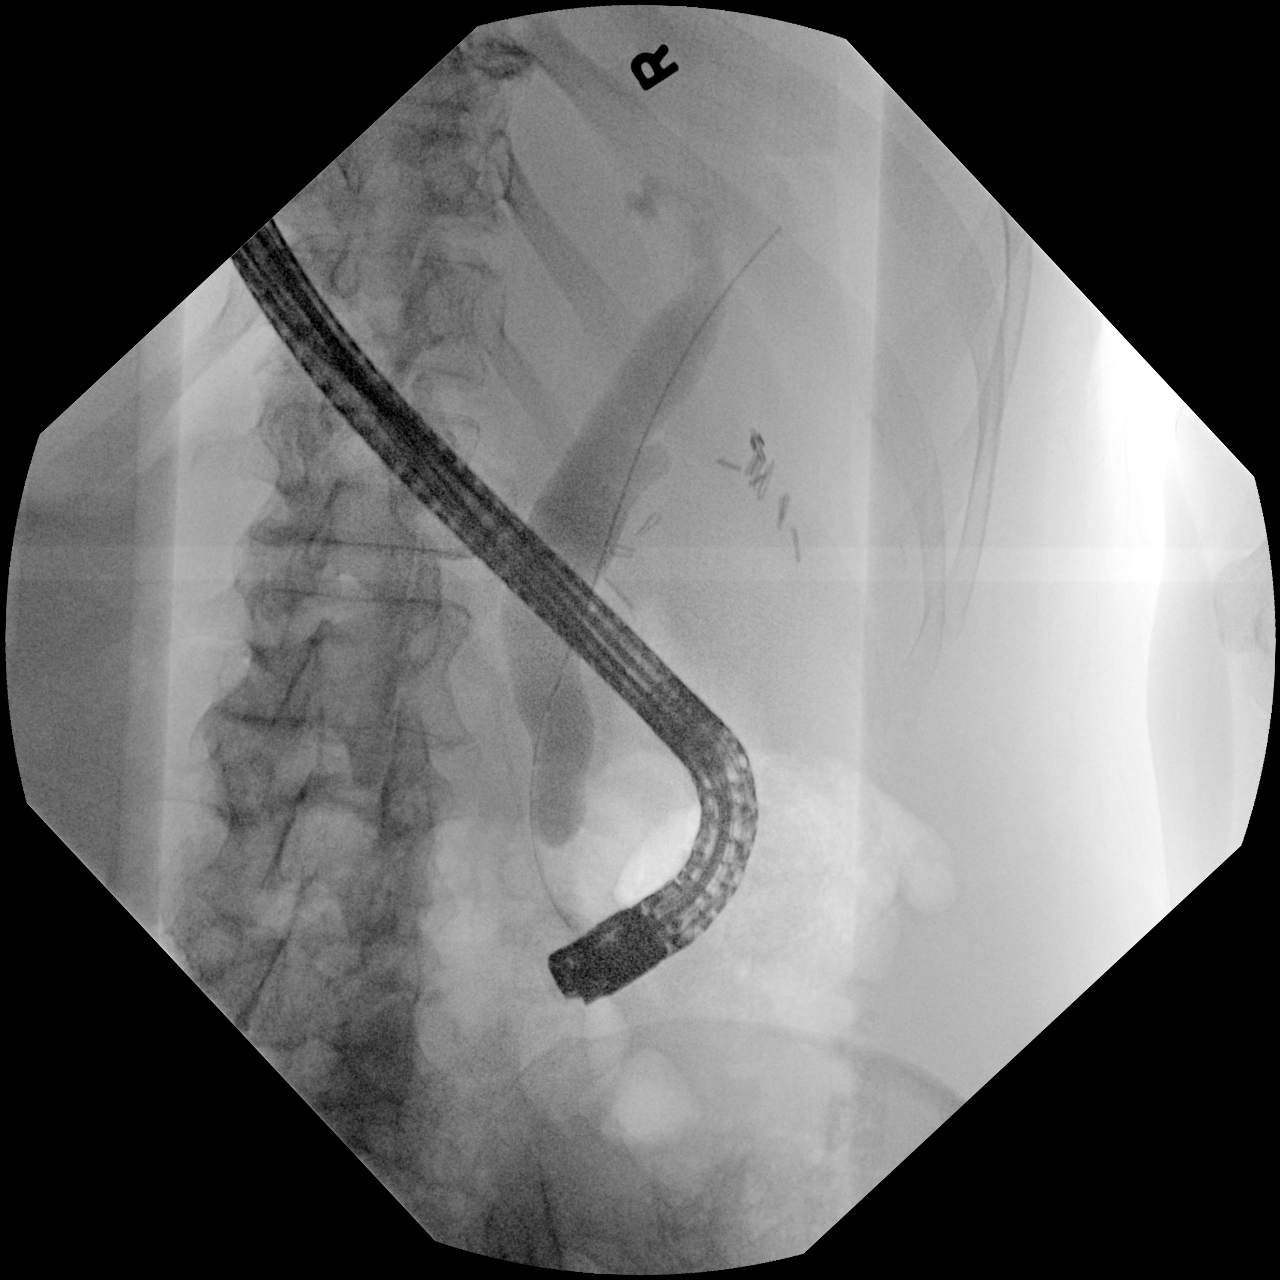
[im 2/3]
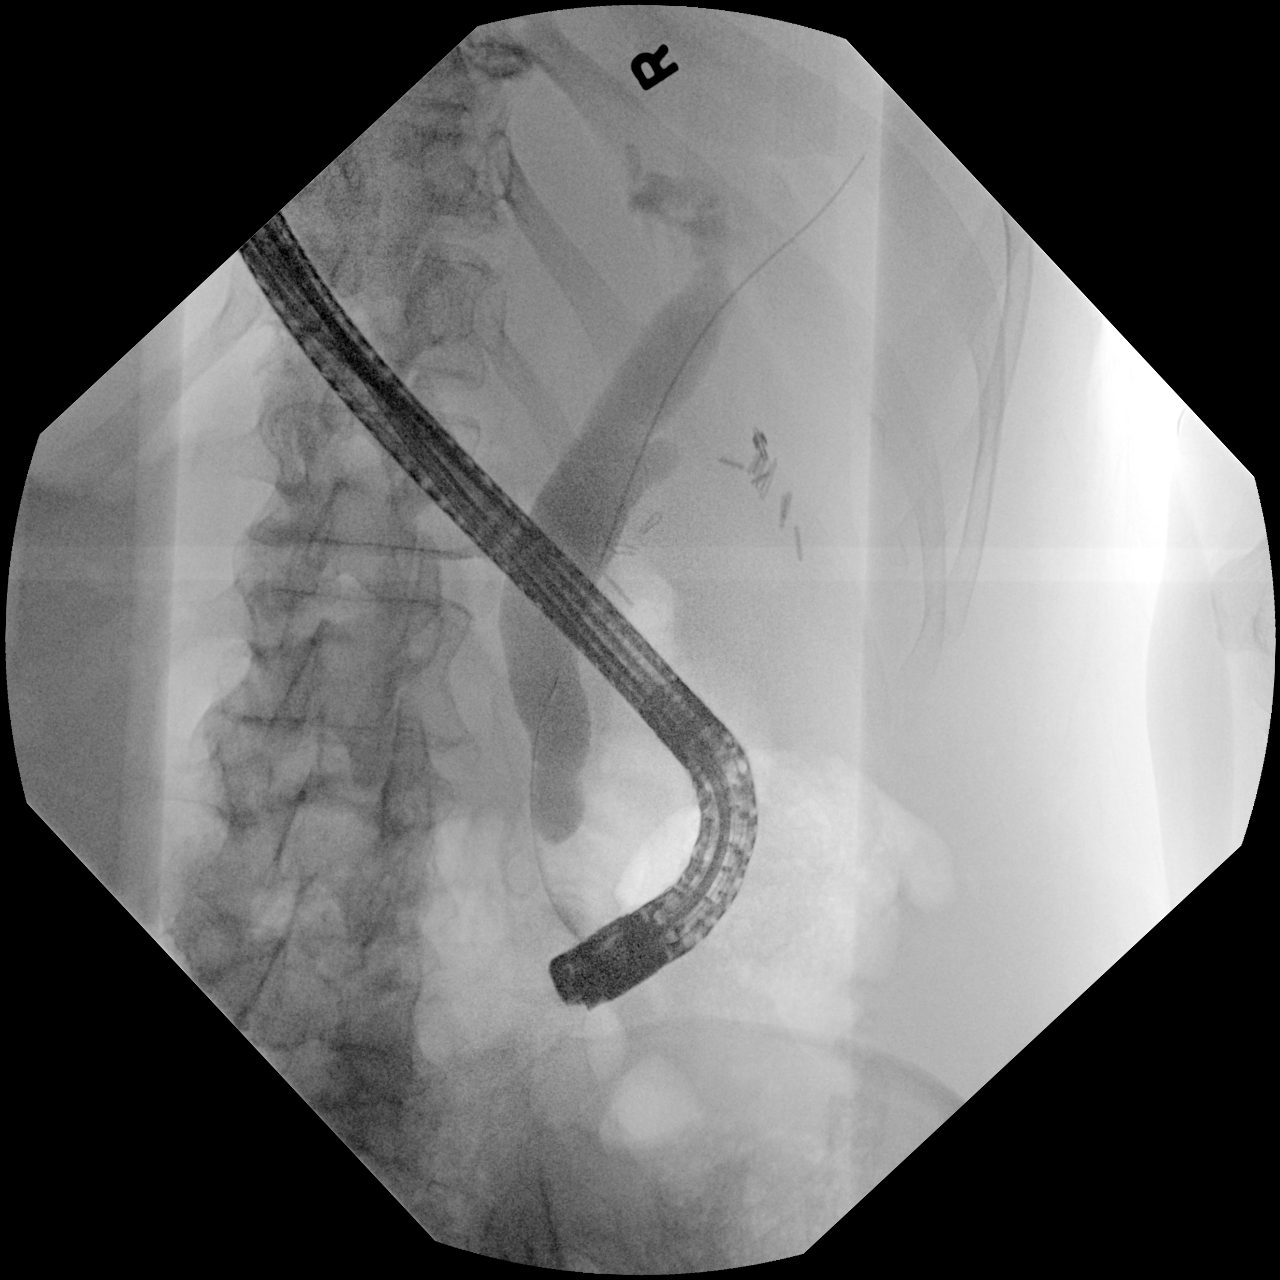
[im 3/3]
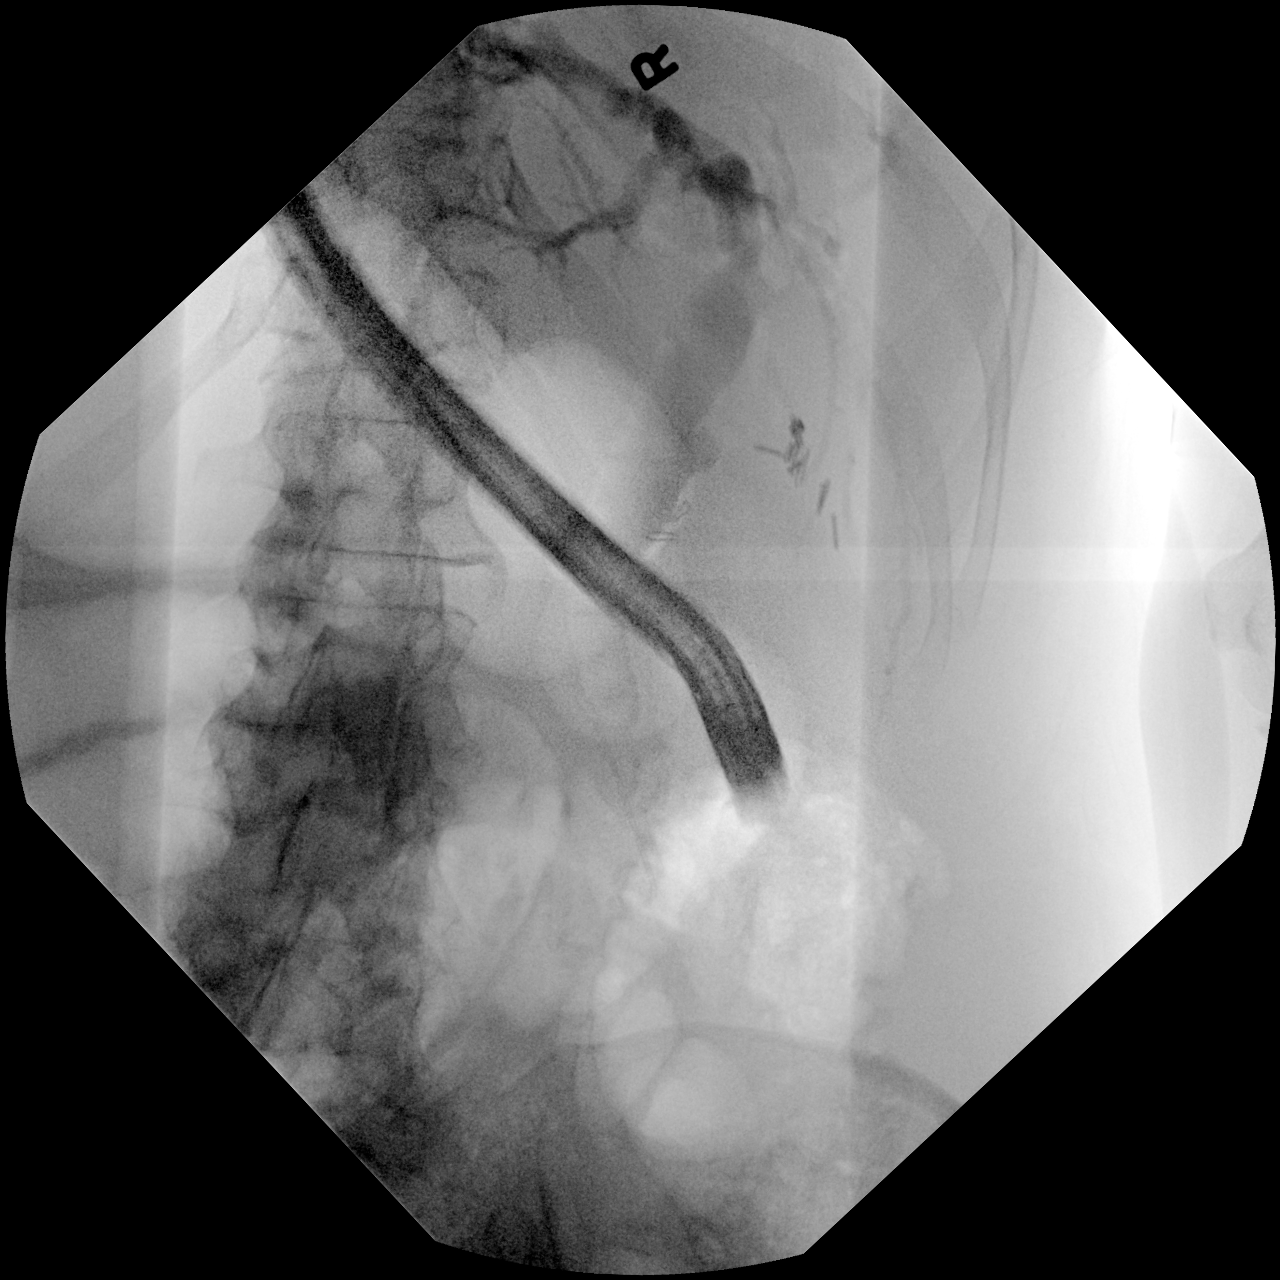

[3 of 3 positions shown; findings below may reference images not displayed]

FINDINGS: A series of fluoroscopic spot images document endoscopic cannulation
and opacification of the CBD. Limited opacification of the
intrahepatic biliary tree, incompletely visualized. Cholecystectomy
clips. No evidence of cystic duct stump leak. No evidence of
extravasation on the final image.
IMPRESSION: Endoscopic CBD cannulation and intervention

These images were submitted for radiologic interpretation only.
Please see the procedural report for the amount of contrast and the
fluoroscopy time utilized.

## 2021-04-30 IMAGING — DX DG ABD PORTABLE 1V
1 series · 1 of 1 positions shown · non-contrast
Comparison: 01/01/2020

CLINICAL DATA: Abdominal pain, distention

EXAM:
PORTABLE ABDOMEN - 1 VIEW

[abdomen kub]
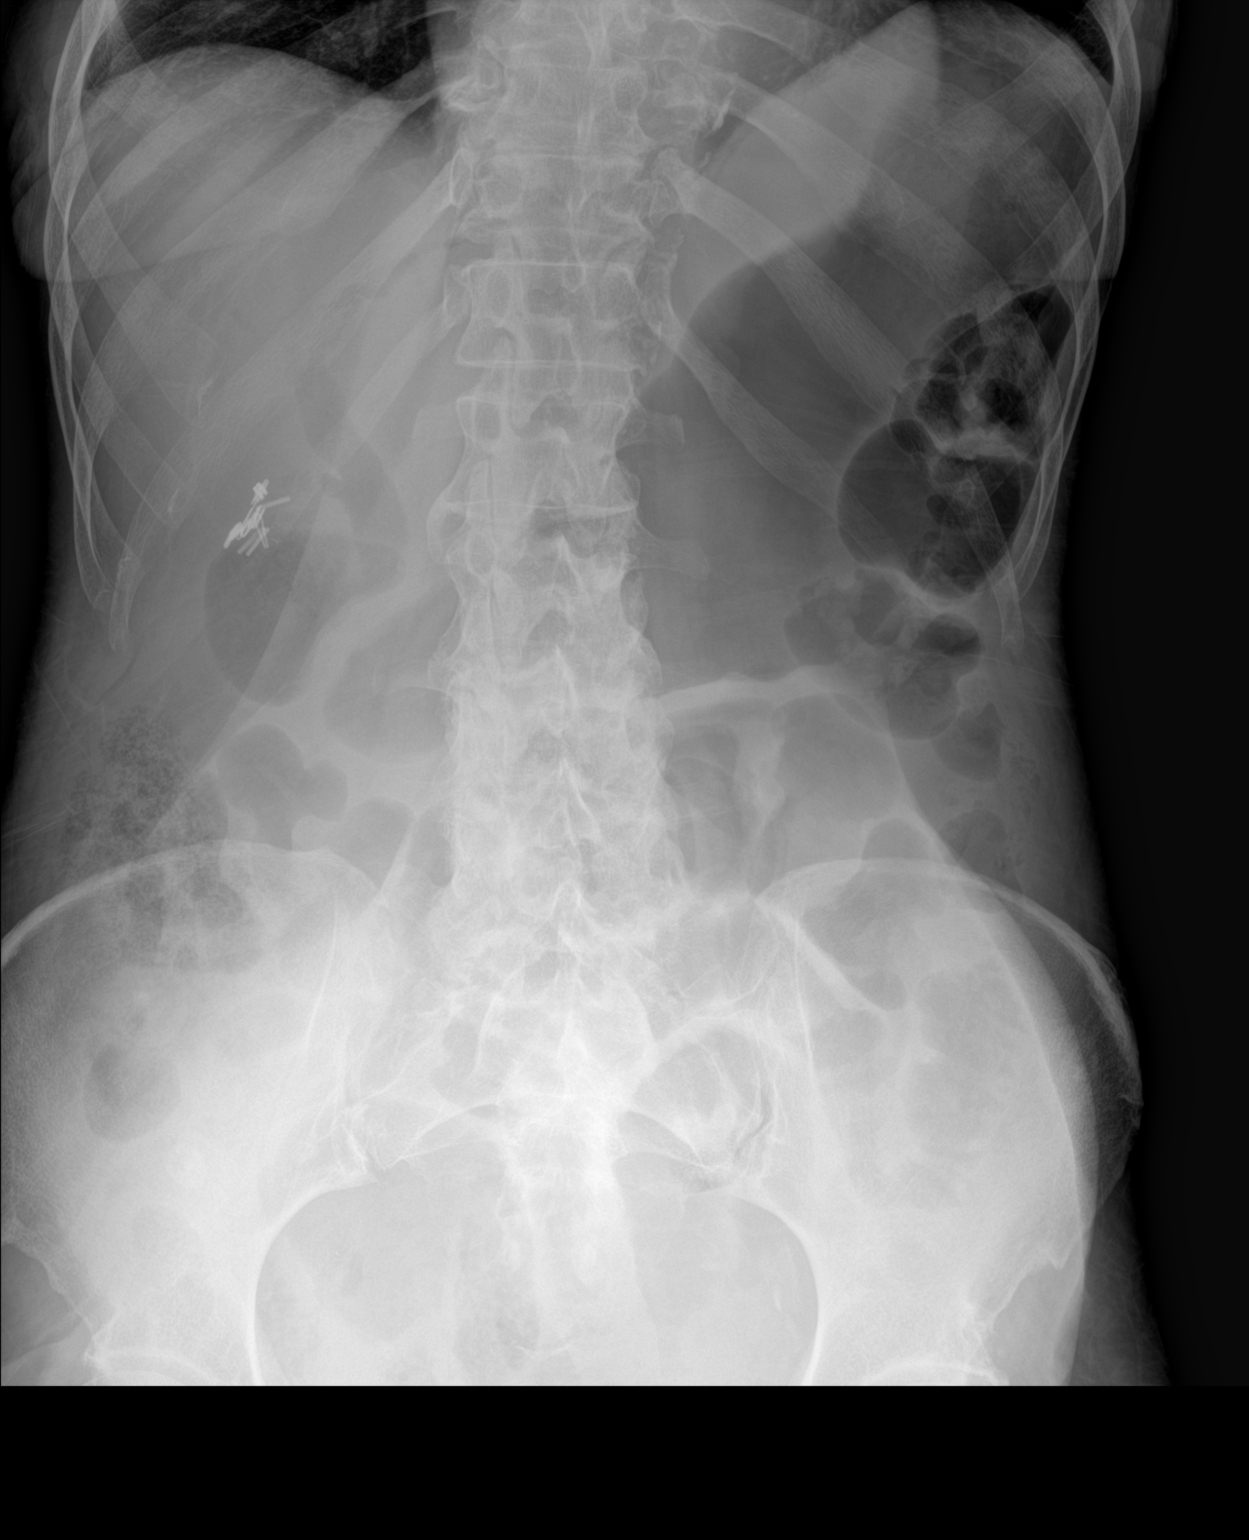

[1 of 1 positions shown; findings below may reference images not displayed]

FINDINGS: Prior cholecystectomy. Pneumobilia noted. Dilated stomach. Gas
throughout mildly prominent large and small bowel loops. No free
air. No suspicious calcification.
IMPRESSION: Mildly dilated stomach. Gas throughout mildly prominent large and
small bowel loops. Overall, there appears to be more colonic gas
than on prior study.

Pneumobilia.

## 2021-06-16 ENCOUNTER — Emergency Department (HOSPITAL_COMMUNITY): Payer: Medicare PPO

## 2021-06-16 ENCOUNTER — Emergency Department (HOSPITAL_COMMUNITY)
Admission: EM | Admit: 2021-06-16 | Discharge: 2021-06-16 | Disposition: A | Discharge status: Home / Self Care | Payer: Medicare PPO | Attending: Emergency Medicine | Admitting: Emergency Medicine

## 2021-06-16 DIAGNOSIS — E876 Hypokalemia: Secondary | ICD-10-CM

## 2021-06-16 DIAGNOSIS — R1013 Epigastric pain: Secondary | ICD-10-CM | POA: Diagnosis not present

## 2021-06-16 DIAGNOSIS — Z79899 Other long term (current) drug therapy: Secondary | ICD-10-CM | POA: Insufficient documentation

## 2021-06-16 DIAGNOSIS — R42 Dizziness and giddiness: Secondary | ICD-10-CM | POA: Diagnosis not present

## 2021-06-16 DIAGNOSIS — F17220 Nicotine dependence, chewing tobacco, uncomplicated: Secondary | ICD-10-CM | POA: Insufficient documentation

## 2021-06-16 DIAGNOSIS — K5900 Constipation, unspecified: Secondary | ICD-10-CM

## 2021-06-16 DIAGNOSIS — R1084 Generalized abdominal pain: Secondary | ICD-10-CM | POA: Diagnosis present

## 2021-06-16 DIAGNOSIS — R1033 Periumbilical pain: Secondary | ICD-10-CM | POA: Insufficient documentation

## 2021-06-16 DIAGNOSIS — F0390 Unspecified dementia without behavioral disturbance: Secondary | ICD-10-CM | POA: Diagnosis not present

## 2021-06-16 DIAGNOSIS — I10 Essential (primary) hypertension: Secondary | ICD-10-CM | POA: Diagnosis not present

## 2021-06-16 LAB — CBC WITH DIFFERENTIAL/PLATELET
Abs Immature Granulocytes: 0.02 10*3/uL (ref 0.00–0.07)
Basophils Absolute: 0 10*3/uL (ref 0.0–0.1)
Basophils Relative: 1 %
Eosinophils Absolute: 0 10*3/uL (ref 0.0–0.5)
Eosinophils Relative: 1 %
HCT: 39.9 % (ref 36.0–46.0)
Hemoglobin: 14.2 g/dL (ref 12.0–15.0)
Immature Granulocytes: 0 %
Lymphocytes Relative: 17 %
Lymphs Abs: 0.8 10*3/uL (ref 0.7–4.0)
MCH: 33.1 pg (ref 26.0–34.0)
MCHC: 35.6 g/dL (ref 30.0–36.0)
MCV: 93 fL (ref 80.0–100.0)
Monocytes Absolute: 0.4 10*3/uL (ref 0.1–1.0)
Monocytes Relative: 8 %
Neutro Abs: 3.6 10*3/uL (ref 1.7–7.7)
Neutrophils Relative %: 73 %
Platelets: 167 10*3/uL (ref 150–400)
RBC: 4.29 MIL/uL (ref 3.87–5.11)
RDW: 13.4 % (ref 11.5–15.5)
WBC: 4.9 10*3/uL (ref 4.0–10.5)
nRBC: 0 % (ref 0.0–0.2)

## 2021-06-16 LAB — URINALYSIS, ROUTINE W REFLEX MICROSCOPIC
Bacteria, UA: NONE SEEN
Bilirubin Urine: NEGATIVE
Glucose, UA: NEGATIVE mg/dL
Ketones, ur: 20 mg/dL — AB
Leukocytes,Ua: NEGATIVE
Nitrite: NEGATIVE
Protein, ur: 30 mg/dL — AB
Specific Gravity, Urine: 1.018 (ref 1.005–1.030)
pH: 5 (ref 5.0–8.0)

## 2021-06-16 LAB — COMPREHENSIVE METABOLIC PANEL
ALT: 16 U/L (ref 0–44)
AST: 33 U/L (ref 15–41)
Albumin: 3.6 g/dL (ref 3.5–5.0)
Alkaline Phosphatase: 65 U/L (ref 38–126)
Anion gap: 14 (ref 5–15)
BUN: 15 mg/dL (ref 8–23)
CO2: 23 mmol/L (ref 22–32)
Calcium: 10.1 mg/dL (ref 8.9–10.3)
Chloride: 99 mmol/L (ref 98–111)
Creatinine, Ser: 1.66 mg/dL — ABNORMAL HIGH (ref 0.44–1.00)
GFR, Estimated: 32 mL/min — ABNORMAL LOW (ref >60)
Glucose, Bld: 88 mg/dL (ref 70–99)
Potassium: 2.5 mmol/L — CL (ref 3.5–5.1)
Sodium: 136 mmol/L (ref 135–145)
Total Bilirubin: 0.9 mg/dL (ref 0.3–1.2)
Total Protein: 6.7 g/dL (ref 6.5–8.1)

## 2021-06-16 LAB — LIPASE, BLOOD: Lipase: 84 U/L — ABNORMAL HIGH (ref 11–51)

## 2021-06-16 MED ORDER — POTASSIUM CHLORIDE CRYS ER 20 MEQ PO TBCR: 20.0000 meq | EXTENDED_RELEASE_TABLET | Freq: Two times a day (BID) | ORAL | 0 refills | Status: AC

## 2021-06-16 MED ORDER — MORPHINE SULFATE (PF) 4 MG/ML IV SOLN
4.0000 mg | Freq: Once | INTRAVENOUS | Status: AC
  Administered 2021-06-16: 4 mg via INTRAVENOUS
  Filled 2021-06-16: qty 1

## 2021-06-16 MED ORDER — IOHEXOL 350 MG/ML SOLN
60.0000 mL | Freq: Once | INTRAVENOUS | Status: AC | PRN
  Administered 2021-06-16: 60 mL via INTRAVENOUS

## 2021-06-16 MED ORDER — SODIUM CHLORIDE 0.9 % IV BOLUS
1000.0000 mL | Freq: Once | INTRAVENOUS | Status: AC
  Administered 2021-06-16: 1000 mL via INTRAVENOUS

## 2021-06-16 MED ORDER — ONDANSETRON HCL 4 MG/2ML IJ SOLN
4.0000 mg | Freq: Once | INTRAMUSCULAR | Status: AC
  Administered 2021-06-16: 4 mg via INTRAVENOUS
  Filled 2021-06-16: qty 2

## 2021-06-16 MED ORDER — POTASSIUM CHLORIDE 10 MEQ/100ML IV SOLN
10.0000 meq | Freq: Once | INTRAVENOUS | Status: AC
  Administered 2021-06-16: 10 meq via INTRAVENOUS
  Filled 2021-06-16: qty 100

## 2021-06-16 MED ORDER — POTASSIUM CHLORIDE CRYS ER 20 MEQ PO TBCR
40.0000 meq | EXTENDED_RELEASE_TABLET | Freq: Once | ORAL | Status: AC
  Administered 2021-06-16: 40 meq via ORAL
  Filled 2021-06-16: qty 2

## 2021-06-16 NOTE — ED Provider Notes (Signed)
[facility] EMERGENCY DEPARTMENT Provider Note   CSN: [id number] Arrival date & time: 06/16/21  0216     History Chief Complaint  Patient presents with   Abdominal Pain   Dizziness    Samantha Gonzales is a 80 y.o. female.  Patient is a 80 year old female with past medical history of hypertension, hyperlipidemia, dementia, cholecystectomy.  Patient presenting today for evaluation of abdominal pain.  She has been having this pain episodically for quite some time, but has worsened over the past few days.  She describes generalized abdominal pain that seems most significant in the periumbilical region and epigastric region.  She denies any fevers or chills.  She denies any bowel complaints.  There was reported to EMS that patient has had dark, malodorous urine for the past few days as well.  The history is provided by the patient.  Abdominal Pain Pain location:  Periumbilical Pain quality: cramping   Pain radiates to:  Does not radiate Pain severity:  Moderate Onset quality:  Gradual Duration:  3 days Timing:  Constant Progression:  Worsening Chronicity:  Recurrent Dizziness     Past Medical History:  Diagnosis Date   Hyperlipidemia    Hypertension    Syncope 10/27/2019    Patient Active Problem List   Diagnosis Date Noted   Severe protein-calorie malnutrition (HCC)    Failure to thrive in adult    Goals of care, counseling/discussion    Palliative care by specialist    DNR (do not resuscitate) discussion    Macrocytic anemia 02/29/2020   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   Protein-calorie malnutrition, severe 01/19/2020   Murmur, heart 01/11/2020   SBO (small bowel obstruction) (HCC) 01/11/2020   Small bowel obstruction (HCC) 12/29/2019   Pressure injury of skin 10/29/2019   Colitis 10/28/2019   AKI (acute kidney injury) (HCC) 10/27/2019   Syncope 10/27/2019   Hypokalemia 10/27/2019   Essential hypertension 10/27/2019   Malnutrition of  moderate degree 09/07/2019   Cholecystitis 09/04/2019   Altered mental status 01/18/2018   Altered mental state 01/17/2018    Past Surgical History:  Procedure Laterality Date   CHOLECYSTECTOMY N/A 09/05/2019   Procedure: LAPAROSCOPIC CHOLECYSTECTOMY WITH INTRAOPERATIVE CHOLANGIOGRAM;  Surgeon: Harriette Bouillon, MD;  Location: [facility] OR;  Service: General;  Laterality: N/A;   ERCP N/A 09/07/2019   Procedure: ENDOSCOPIC RETROGRADE CHOLANGIOPANCREATOGRAPHY (ERCP);  Surgeon: Vida Rigger, MD;  Location: [facility] ENDOSCOPY;  Service: Endoscopy;  Laterality: N/A;   IR RADIOLOGIST EVAL & MGMT  02/14/2020   LAPAROTOMY N/A 01/12/2020   Procedure: EXPLORATORY LAPAROTOMY LYSIS OF ADHESIONS AND SMALL BOWEL OBSTRUCTION;  Surgeon: Harriette Bouillon, MD;  Location: [facility] ORS;  Service: General;  Laterality: N/A;   PANCREATIC STENT PLACEMENT  09/07/2019   Procedure: PANCREATIC STENT PLACEMENT;  Surgeon: Vida Rigger, MD;  Location: [facility] ENDOSCOPY;  Service: Endoscopy;;   REMOVAL OF STONES  09/07/2019   Procedure: REMOVAL OF STONES;  Surgeon: Vida Rigger, MD;  Location: [facility] ENDOSCOPY;  Service: Endoscopy;;   SPHINCTEROTOMY  09/07/2019   Procedure: Dennison Mascot;  Surgeon: Vida Rigger, MD;  Location: [facility] ENDOSCOPY;  Service: Endoscopy;;   TUBAL LIGATION       OB History   No obstetric history on file.     Family History  Problem Relation Age of Onset   Alcoholism Father    CAD Neg Hx     Social History   Tobacco Use   Smoking status: Never   Smokeless tobacco: Current    Types: Sports administrator  Vaping Use   Vaping Use: Never used  Substance Use Topics   Alcohol use: No   Drug use: No    Home Medications Prior to Admission medications   Medication Sig Start Date End Date Taking? Authorizing Provider  acetaminophen (TYLENOL) 500 MG tablet Take 2 tablets (1,000 mg total) by mouth every 6 (six) hours as needed for moderate pain. 03/05/20   Glade Lloyd, MD  amLODipine (NORVASC) 10 MG tablet Take 10 mg by mouth daily.  09/25/19   [provider]  collagenase (SANTYL) ointment Apply topically daily. Patient taking differently: Apply 1 application topically daily.  02/03/20   Meuth, Brooke A, PA-C  ferrous sulfate 325 (65 FE) MG tablet Take 1 tablet (325 mg total) by mouth daily with breakfast. Patient not taking: Reported on 02/29/2020 01/20/20   Juliet Rude, PA-C  folic acid (FOLVITE) 1 MG tablet Take 1 tablet (1 mg total) by mouth daily. Patient not taking: Reported on 02/29/2020 01/20/20   Noralee Stain, DO  gabapentin (NEURONTIN) 100 MG capsule Take 2 capsules (200 mg total) by mouth 3 (three) times daily. Patient not taking: Reported on 02/29/2020 01/23/20   Sherrie George, PA-C  ibuprofen (ADVIL) 200 MG tablet You can take 2 tablets every 6 hours as needed for pain.  Follow package directions.  You can buy this over-the-counter at any drugstore. Patient not taking: Reported on 02/29/2020 01/23/20   Sherrie George, PA-C  Multiple Vitamin (MULTIVITAMIN WITH MINERALS) TABS tablet Take 1 tablet by mouth daily. Patient not taking: Reported on 02/29/2020 01/19/20   Juliet Rude, PA-C  oxyCODONE 10 MG TABS Take 0.5-1 tablets (5-10 mg total) by mouth every 6 (six) hours as needed for severe pain. Patient taking differently: Take 10 mg by mouth every 6 (six) hours as needed (for pain).  02/02/20   Meuth, Brooke A, PA-C  polyethylene glycol (MIRALAX / GLYCOLAX) 17 g packet Take 17 g by mouth daily as needed for mild constipation. 02/02/20   Meuth, Brooke A, PA-C  rosuvastatin (CRESTOR) 20 MG tablet Take 20 mg by mouth daily. 07/30/18   [provider]  vitamin B-12 (CYANOCOBALAMIN) 1000 MCG tablet Take 1 tablet (1,000 mcg total) by mouth daily. 03/05/20   Glade Lloyd, MD    Allergies    Penicillins  Review of Systems   Review of Systems  Gastrointestinal:  Positive for abdominal pain.  Neurological:  Positive for dizziness.  All other systems reviewed and are negative.  Physical Exam Updated  Vital Signs BP 134/68 (BP Location: Right Arm)   Pulse 88   Temp 98.6 F (37 C) (Oral)   Resp 18   SpO2 99%   Physical Exam Vitals and nursing note reviewed.  Constitutional:      General: She is not in acute distress.    Appearance: She is well-developed. She is not diaphoretic.  HENT:     Head: Normocephalic and atraumatic.  Cardiovascular:     Rate and Rhythm: Normal rate and regular rhythm.     Heart sounds: No murmur heard.   No friction rub. No gallop.  Pulmonary:     Effort: Pulmonary effort is normal. No respiratory distress.     Breath sounds: Normal breath sounds. No wheezing.  Abdominal:     General: Bowel sounds are normal. There is no distension.     Palpations: Abdomen is soft.     Tenderness: There is abdominal tenderness in the epigastric area and periumbilical area. There is no right CVA  tenderness, left CVA tenderness, guarding or rebound.  Musculoskeletal:        General: Normal range of motion.     Cervical back: Normal range of motion and neck supple.  Skin:    General: Skin is warm and dry.  Neurological:     General: No focal deficit present.     Mental Status: She is alert and oriented to person, place, and time.    ED Results / Procedures / Treatments   Labs (all labs ordered are listed, but only abnormal results are displayed) Labs Reviewed  COMPREHENSIVE METABOLIC PANEL  CBC WITH DIFFERENTIAL/PLATELET  LIPASE, BLOOD    EKG None  Radiology No results found.  Procedures Procedures   Medications Ordered in ED Medications  sodium chloride 0.9 % bolus 1,000 mL (has no administration in time range)  ondansetron (ZOFRAN) injection 4 mg (has no administration in time range)  morphine 4 MG/ML injection 4 mg (has no administration in time range)    ED Course  I have reviewed the triage vital signs and the nursing notes.  Pertinent labs & imaging results that were available during my care of the patient were reviewed by me and considered  in my medical decision making (see chart for details).    MDM Rules/Calculators/A&P  Patient with past medical history as per HPI presenting with complaints of abdominal pain.  It sounds as though that this has been a problem intermittently for many months, but became worse over the past few days.  There is mild tenderness in the upper abdomen, but no peritoneal signs and abdomen is benign.  Vital signs are stable and she is afebrile.  Patient has no white count.  CT scan shows no acute intra-abdominal process with the exception of perhaps constipation.  I will recommend magnesium citrate for this.  Her metabolic profile does reveal a potassium of 2.5 for which she was given both IV and oral potassium and will be discharged with potassium replacement.  She also has a mild elevation of her creatinine above baseline to 1.66.  She was hydrated with normal saline.  Patient awaiting results of urinalysis.  Patient seems appropriate for discharge pending these results.  I attempted to call patient's daughter, [name] to discuss but she did not answer.  Final Clinical Impression(s) / ED Diagnoses Final diagnoses:  None    Rx / DC Orders ED Discharge Orders     None        Geoffery Lyons, MD 06/16/21 [phone]

## 2021-06-16 NOTE — Discharge Instructions (Addendum)
Begin taking potassium as prescribed.  Magnesium citrate: Drink the entire 10 ounce bottle for relief of constipation.  Continue taking your hydrocodone as previously prescribed as needed for pain.  Return to the emergency department if you develop severe pain, high fevers, bloody stools, or other new and concerning symptoms.

## 2021-06-16 NOTE — ED Triage Notes (Signed)
Brought to ED by [facility] EMS via stretcher with c/o left sided abdominal pain that has been recurrent x past few years accompanied by dizziness that is aggravated by position changes. EMS reports pt has had  dark and foul smelling urine x past few days.  Also noted that early signs of dementia and has been "cranky".

## 2021-06-16 NOTE — ED Notes (Signed)
Pt resting in stretcher, daughter en route to pick up. Pt still c/o abdominal pain, per d/c instructions pt needs to take mag citrate at home. RN explained this to pt, also explaining to daughter. Helped pt get clothes on, put pt in w/c. Pt goes in w/c to exit to daughter's vehicle.

## 2021-06-24 ENCOUNTER — Inpatient Hospital Stay (HOSPITAL_COMMUNITY)
Admission: EM | Admit: 2021-06-24 | Discharge: 2021-06-28 | DRG: 683 | Disposition: A | Discharge status: Home / Self Care | Payer: Medicare PPO | Attending: Internal Medicine | Admitting: Internal Medicine

## 2021-06-24 ENCOUNTER — Emergency Department (HOSPITAL_COMMUNITY): Payer: Medicare PPO

## 2021-06-24 ENCOUNTER — Other Ambulatory Visit: Payer: Self-pay

## 2021-06-24 ENCOUNTER — Encounter (HOSPITAL_COMMUNITY): Payer: Self-pay

## 2021-06-24 DIAGNOSIS — Z9181 History of falling: Secondary | ICD-10-CM | POA: Diagnosis not present

## 2021-06-24 DIAGNOSIS — R109 Unspecified abdominal pain: Secondary | ICD-10-CM

## 2021-06-24 DIAGNOSIS — N179 Acute kidney failure, unspecified: Principal | ICD-10-CM | POA: Diagnosis present

## 2021-06-24 DIAGNOSIS — Z880 Allergy status to penicillin: Secondary | ICD-10-CM | POA: Diagnosis not present

## 2021-06-24 DIAGNOSIS — F0390 Unspecified dementia without behavioral disturbance: Secondary | ICD-10-CM | POA: Diagnosis present

## 2021-06-24 DIAGNOSIS — N390 Urinary tract infection, site not specified: Secondary | ICD-10-CM | POA: Diagnosis present

## 2021-06-24 DIAGNOSIS — M1990 Unspecified osteoarthritis, unspecified site: Secondary | ICD-10-CM

## 2021-06-24 DIAGNOSIS — R002 Palpitations: Secondary | ICD-10-CM | POA: Diagnosis present

## 2021-06-24 DIAGNOSIS — Z681 Body mass index (BMI) 19 or less, adult: Secondary | ICD-10-CM

## 2021-06-24 DIAGNOSIS — E785 Hyperlipidemia, unspecified: Secondary | ICD-10-CM | POA: Diagnosis present

## 2021-06-24 DIAGNOSIS — R1084 Generalized abdominal pain: Secondary | ICD-10-CM

## 2021-06-24 DIAGNOSIS — Z9851 Tubal ligation status: Secondary | ICD-10-CM

## 2021-06-24 DIAGNOSIS — Z888 Allergy status to other drugs, medicaments and biological substances status: Secondary | ICD-10-CM | POA: Diagnosis not present

## 2021-06-24 DIAGNOSIS — R112 Nausea with vomiting, unspecified: Secondary | ICD-10-CM | POA: Diagnosis present

## 2021-06-24 DIAGNOSIS — R636 Underweight: Secondary | ICD-10-CM | POA: Diagnosis present

## 2021-06-24 DIAGNOSIS — R9431 Abnormal electrocardiogram [ECG] [EKG]: Secondary | ICD-10-CM | POA: Diagnosis present

## 2021-06-24 DIAGNOSIS — M1712 Unilateral primary osteoarthritis, left knee: Secondary | ICD-10-CM | POA: Diagnosis not present

## 2021-06-24 DIAGNOSIS — R4587 Impulsiveness: Secondary | ICD-10-CM | POA: Diagnosis present

## 2021-06-24 DIAGNOSIS — Z720 Tobacco use: Secondary | ICD-10-CM

## 2021-06-24 DIAGNOSIS — M25562 Pain in left knee: Secondary | ICD-10-CM | POA: Diagnosis present

## 2021-06-24 DIAGNOSIS — Z79899 Other long term (current) drug therapy: Secondary | ICD-10-CM | POA: Diagnosis not present

## 2021-06-24 DIAGNOSIS — Z885 Allergy status to narcotic agent status: Secondary | ICD-10-CM

## 2021-06-24 DIAGNOSIS — G8929 Other chronic pain: Secondary | ICD-10-CM | POA: Diagnosis present

## 2021-06-24 DIAGNOSIS — I10 Essential (primary) hypertension: Secondary | ICD-10-CM | POA: Diagnosis present

## 2021-06-24 DIAGNOSIS — R42 Dizziness and giddiness: Secondary | ICD-10-CM

## 2021-06-24 DIAGNOSIS — Z20822 Contact with and (suspected) exposure to covid-19: Secondary | ICD-10-CM | POA: Diagnosis present

## 2021-06-24 DIAGNOSIS — D539 Nutritional anemia, unspecified: Secondary | ICD-10-CM | POA: Diagnosis present

## 2021-06-24 DIAGNOSIS — E876 Hypokalemia: Secondary | ICD-10-CM | POA: Diagnosis present

## 2021-06-24 DIAGNOSIS — E860 Dehydration: Secondary | ICD-10-CM | POA: Diagnosis present

## 2021-06-24 LAB — CBC WITH DIFFERENTIAL/PLATELET
Abs Immature Granulocytes: 0.02 10*3/uL (ref 0.00–0.07)
Basophils Absolute: 0 10*3/uL (ref 0.0–0.1)
Basophils Relative: 1 %
Eosinophils Absolute: 0.1 10*3/uL (ref 0.0–0.5)
Eosinophils Relative: 1 %
HCT: 36.8 % (ref 36.0–46.0)
Hemoglobin: 13.2 g/dL (ref 12.0–15.0)
Immature Granulocytes: 0 %
Lymphocytes Relative: 15 %
Lymphs Abs: 0.8 10*3/uL (ref 0.7–4.0)
MCH: 33.4 pg (ref 26.0–34.0)
MCHC: 35.9 g/dL (ref 30.0–36.0)
MCV: 93.2 fL (ref 80.0–100.0)
Monocytes Absolute: 0.4 10*3/uL (ref 0.1–1.0)
Monocytes Relative: 7 %
Neutro Abs: 4.2 10*3/uL (ref 1.7–7.7)
Neutrophils Relative %: 76 %
Platelets: 202 10*3/uL (ref 150–400)
RBC: 3.95 MIL/uL (ref 3.87–5.11)
RDW: 14.1 % (ref 11.5–15.5)
WBC: 5.6 10*3/uL (ref 4.0–10.5)
nRBC: 0 % (ref 0.0–0.2)

## 2021-06-24 LAB — URINALYSIS, ROUTINE W REFLEX MICROSCOPIC
Bilirubin Urine: NEGATIVE
Glucose, UA: NEGATIVE mg/dL
Ketones, ur: 5 mg/dL — AB
Nitrite: NEGATIVE
Protein, ur: 100 mg/dL — AB
Specific Gravity, Urine: 1.01 (ref 1.005–1.030)
pH: 6 (ref 5.0–8.0)

## 2021-06-24 LAB — ACETAMINOPHEN LEVEL: Acetaminophen (Tylenol), Serum: <10 ug/mL — ABNORMAL LOW (ref 10–30)

## 2021-06-24 LAB — COMPREHENSIVE METABOLIC PANEL
ALT: 14 U/L (ref 0–44)
AST: 22 U/L (ref 15–41)
Albumin: 3.7 g/dL (ref 3.5–5.0)
Alkaline Phosphatase: 50 U/L (ref 38–126)
Anion gap: 12 (ref 5–15)
BUN: 13 mg/dL (ref 8–23)
CO2: 26 mmol/L (ref 22–32)
Calcium: 9.9 mg/dL (ref 8.9–10.3)
Chloride: 103 mmol/L (ref 98–111)
Creatinine, Ser: 1.31 mg/dL — ABNORMAL HIGH (ref 0.44–1.00)
GFR, Estimated: 42 mL/min — ABNORMAL LOW (ref >60)
Glucose, Bld: 84 mg/dL (ref 70–99)
Potassium: 2.4 mmol/L — CL (ref 3.5–5.1)
Sodium: 141 mmol/L (ref 135–145)
Total Bilirubin: 0.9 mg/dL (ref 0.3–1.2)
Total Protein: 6.7 g/dL (ref 6.5–8.1)

## 2021-06-24 LAB — MAGNESIUM: Magnesium: 2.4 mg/dL (ref 1.7–2.4)

## 2021-06-24 LAB — AMMONIA: Ammonia: 12 umol/L (ref 9–35)

## 2021-06-24 LAB — LIPASE, BLOOD: Lipase: 80 U/L — ABNORMAL HIGH (ref 11–51)

## 2021-06-24 LAB — ETHANOL: Alcohol, Ethyl (B): <10 mg/dL (ref <10)

## 2021-06-24 MED ORDER — ACETAMINOPHEN 325 MG PO TABS
650.0000 mg | ORAL_TABLET | Freq: Four times a day (QID) | ORAL | Status: DC | PRN
  Administered 2021-06-25 – 2021-06-28 (×3): 650 mg via ORAL
  Filled 2021-06-24 (×4): qty 2

## 2021-06-24 MED ORDER — SODIUM CHLORIDE 0.9 % IV BOLUS
1000.0000 mL | Freq: Once | INTRAVENOUS | Status: AC
  Administered 2021-06-24: 1000 mL via INTRAVENOUS

## 2021-06-24 MED ORDER — POTASSIUM CHLORIDE 10 MEQ/100ML IV SOLN
10.0000 meq | INTRAVENOUS | Status: AC
Start: 2021-06-25 — End: 2021-06-25
  Administered 2021-06-25 (×4): 10 meq via INTRAVENOUS
  Filled 2021-06-24 (×4): qty 100

## 2021-06-24 MED ORDER — HYDROMORPHONE HCL 1 MG/ML IJ SOLN
0.5000 mg | INTRAMUSCULAR | Status: DC | PRN
  Administered 2021-06-25 (×2): 0.5 mg via INTRAVENOUS
  Filled 2021-06-24 (×2): qty 1

## 2021-06-24 MED ORDER — FENTANYL CITRATE PF 50 MCG/ML IJ SOSY
50.0000 ug | PREFILLED_SYRINGE | Freq: Once | INTRAMUSCULAR | Status: AC
Start: 2021-06-24 — End: 2021-06-24
  Administered 2021-06-24: 50 ug via INTRAVENOUS
  Filled 2021-06-24: qty 1

## 2021-06-24 MED ORDER — MECLIZINE HCL 25 MG PO TABS
25.0000 mg | ORAL_TABLET | Freq: Three times a day (TID) | ORAL | Status: DC | PRN
  Administered 2021-06-25: 25 mg via ORAL
  Filled 2021-06-24: qty 1

## 2021-06-24 MED ORDER — FENTANYL CITRATE PF 50 MCG/ML IJ SOSY
50.0000 ug | PREFILLED_SYRINGE | Freq: Once | INTRAMUSCULAR | Status: AC
  Administered 2021-06-24: 50 ug via INTRAVENOUS
  Filled 2021-06-24: qty 1

## 2021-06-24 MED ORDER — SODIUM CHLORIDE 0.9 % IV SOLN
1.0000 g | INTRAVENOUS | Status: DC
  Administered 2021-06-25: 1 g via INTRAVENOUS
  Filled 2021-06-24: qty 1
  Filled 2021-06-24: qty 10

## 2021-06-24 MED ORDER — ROSUVASTATIN CALCIUM 20 MG PO TABS
20.0000 mg | ORAL_TABLET | Freq: Every day | ORAL | Status: DC
  Administered 2021-06-26 – 2021-06-28 (×3): 20 mg via ORAL
  Filled 2021-06-24 (×3): qty 1

## 2021-06-24 MED ORDER — LACTATED RINGERS IV SOLN: INTRAVENOUS | Status: DC

## 2021-06-24 MED ORDER — POTASSIUM CHLORIDE 10 MEQ/100ML IV SOLN: 10.0000 meq | INTRAVENOUS | Status: DC

## 2021-06-24 MED ORDER — POTASSIUM CHLORIDE 10 MEQ/100ML IV SOLN
10.0000 meq | INTRAVENOUS | Status: AC
Start: 2021-06-24 — End: 2021-06-24
  Administered 2021-06-24 (×3): 10 meq via INTRAVENOUS
  Filled 2021-06-24 (×3): qty 100

## 2021-06-24 MED ORDER — ACETAMINOPHEN 650 MG RE SUPP: 650.0000 mg | Freq: Four times a day (QID) | RECTAL | Status: DC | PRN

## 2021-06-24 MED ORDER — SENNOSIDES-DOCUSATE SODIUM 8.6-50 MG PO TABS: 1.0000 | ORAL_TABLET | Freq: Every evening | ORAL | Status: DC | PRN

## 2021-06-24 MED ORDER — AMLODIPINE BESYLATE 10 MG PO TABS
10.0000 mg | ORAL_TABLET | Freq: Every day | ORAL | Status: DC
  Administered 2021-06-26 – 2021-06-28 (×3): 10 mg via ORAL
  Filled 2021-06-24 (×3): qty 1
  Filled 2021-06-24: qty 2

## 2021-06-24 MED ORDER — ENOXAPARIN SODIUM 30 MG/0.3ML IJ SOSY
30.0000 mg | PREFILLED_SYRINGE | INTRAMUSCULAR | Status: DC
  Administered 2021-06-25: 30 mg via SUBCUTANEOUS
  Filled 2021-06-24: qty 0.3

## 2021-06-24 MED ORDER — MORPHINE SULFATE (PF) 4 MG/ML IV SOLN
4.0000 mg | Freq: Once | INTRAVENOUS | Status: AC
  Administered 2021-06-24: 4 mg via INTRAVENOUS
  Filled 2021-06-24: qty 1

## 2021-06-24 MED ORDER — ONDANSETRON HCL 4 MG/2ML IJ SOLN
4.0000 mg | Freq: Once | INTRAMUSCULAR | Status: AC
  Administered 2021-06-24: 4 mg via INTRAVENOUS
  Filled 2021-06-24: qty 2

## 2021-06-24 MED ORDER — SODIUM CHLORIDE 0.9 % IV SOLN
1.0000 g | Freq: Once | INTRAVENOUS | Status: AC
  Administered 2021-06-24: 1 g via INTRAVENOUS
  Filled 2021-06-24: qty 10

## 2021-06-24 MED ORDER — TRIMETHOBENZAMIDE HCL 100 MG/ML IM SOLN: 200.0000 mg | Freq: Three times a day (TID) | INTRAMUSCULAR | Status: DC | PRN

## 2021-06-24 NOTE — H&P (Signed)
History and Physical    Samantha Gonzales BDZ:[id number] DOB: 08/12/1941 DOA: 06/24/2021  PCP: Chesley Noon, MD   Patient coming from: Home  Chief Complaint: Nausea vomiting, dizziness  HPI: Samantha Gonzales is a 80 y.o. female with medical history significant for dementia, HTN, HLD who presents for evaluation of dizziness and being off balance when she stands up and has had falls at home.  She has had symptoms for the last week that have progressively worsened.  She has been having nausea and vomiting for the last week and has not been able to tolerate p.o. intake over that time period.  Her daughter finally made her come to the emergency room.  Daughter is not at bedside at this time.  Patient is not a good historian.  She reports she has some lower abdominal pain and is urinating frequently but does not have dysuria.  States she has not had diarrhea.  She denies any chest pain, cough, shortness of breath, fever or chills.  ED Course: Ms. Leeth hemodynamically stable.  Found to have dehydration and a UTI.  She also has low potassium level.  Magnesium level was normal.  She was given some replacement potassium in the emergency room but was significantly low at 2.4.  141 chloride 103 bicarb 26 creatinine 1.31 from baseline of 0.7, BUN 13.  Magnesium 2.4 alk phos is 50 AST 22 ALT 14 ammonia 12.  CBC unremarkable.  Urinalysis positive for ketones leukocytes with 11-20 WBCs on microscopy.  Swab pending.  The hospitalist service has been asked to admit for further management  Review of Systems:  Unable to obtain secondary to dementia  Past Medical History:  Diagnosis Date   Hyperlipidemia    Hypertension    Syncope 10/27/2019    Past Surgical History:  Procedure Laterality Date   CHOLECYSTECTOMY N/A 09/05/2019   Procedure: LAPAROSCOPIC CHOLECYSTECTOMY WITH INTRAOPERATIVE CHOLANGIOGRAM;  Surgeon: Erroll Luna, MD;  Location: [place];  Service: General;  Laterality: N/A;   ERCP N/A  09/07/2019   Procedure: ENDOSCOPIC RETROGRADE CHOLANGIOPANCREATOGRAPHY (ERCP);  Surgeon: Clarene Essex, MD;  Location: [place];  Service: Endoscopy;  Laterality: N/A;   IR RADIOLOGIST EVAL & MGMT  02/14/2020   LAPAROTOMY N/A 01/12/2020   Procedure: EXPLORATORY LAPAROTOMY LYSIS OF ADHESIONS AND SMALL BOWEL OBSTRUCTION;  Surgeon: Erroll Luna, MD;  Location: [facility] ORS;  Service: General;  Laterality: N/A;   PANCREATIC STENT PLACEMENT  09/07/2019   Procedure: PANCREATIC STENT PLACEMENT;  Surgeon: Clarene Essex, MD;  Location: [facility] ENDOSCOPY;  Service: Endoscopy;;   REMOVAL OF STONES  09/07/2019   Procedure: REMOVAL OF STONES;  Surgeon: Clarene Essex, MD;  Location: [facility] ENDOSCOPY;  Service: Endoscopy;;   SPHINCTEROTOMY  09/07/2019   Procedure: [name];  Surgeon: Clarene Essex, MD;  Location: [place];  Service: Endoscopy;;   TUBAL LIGATION      Social History  reports that she has never smoked. Her smokeless tobacco use includes chew. She reports that she does not drink alcohol and does not use drugs.  Allergies  Allergen Reactions   Guaifenesin-Codeine     Other reaction(s): Unknown   Hydrochlorothiazide     Other reaction(s): severe muscle aches   Penicillins Hives, Itching and Rash    Has patient had a PCN reaction causing immediate rash, facial/tongue/throat swelling, SOB or lightheadedness with hypotension: Yes Has patient had a PCN reaction causing severe rash involving mucus membranes or skin necrosis: yes Has patient had a PCN reaction that required hospitalization: unk Has patient  had a PCN reaction occurring within the last 10 years: unk If all of the above answers are "NO", then may proceed with Cephalosporin use.     Family History  Problem Relation Age of Onset   Alcoholism Father    CAD Neg Hx      Prior to Admission medications   Medication Sig Start Date End Date Taking? Authorizing Provider  acetaminophen (TYLENOL) 500 MG tablet Take 2 tablets (1,000 mg total)  by mouth every 6 (six) hours as needed for moderate pain. 03/05/20   Aline August, MD  amLODipine (NORVASC) 10 MG tablet Take 10 mg by mouth daily. 09/25/19   [provider]  collagenase (SANTYL) ointment Apply topically daily. Patient not taking: Reported on 06/16/2021 02/03/20   Margie Billet A, PA-C  donepezil (ARICEPT) 5 MG tablet Take 5 mg by mouth daily. 05/16/21   [provider]  DULoxetine (CYMBALTA) 30 MG capsule Take 30 mg by mouth daily. 04/30/21   [provider]  ferrous sulfate 325 (65 FE) MG tablet Take 1 tablet (325 mg total) by mouth daily with breakfast. Patient not taking: No sig reported 01/20/20   Norm Parcel, PA-C  folic acid (FOLVITE) 1 MG tablet Take 1 tablet (1 mg total) by mouth daily. Patient not taking: No sig reported 01/20/20   Dessa Phi, DO  gabapentin (NEURONTIN) 100 MG capsule Take 2 capsules (200 mg total) by mouth 3 (three) times daily. Patient not taking: No sig reported 01/23/20   Earnstine Regal, PA-C  ibuprofen (ADVIL) 200 MG tablet You can take 2 tablets every 6 hours as needed for pain.  Follow package directions.  You can buy this over-the-counter at any drugstore. Patient not taking: No sig reported 01/23/20   Earnstine Regal, PA-C  meclizine (ANTIVERT) 25 MG tablet Take 25 mg by mouth 3 (three) times daily as needed for dizziness. 05/28/21   [provider]  Multiple Vitamin (MULTIVITAMIN WITH MINERALS) TABS tablet Take 1 tablet by mouth daily. Patient not taking: No sig reported 01/19/20   Norm Parcel, PA-C  oxyCODONE 10 MG TABS Take 0.5-1 tablets (5-10 mg total) by mouth every 6 (six) hours as needed for severe pain. Patient taking differently: Take 10 mg by mouth every 6 (six) hours as needed (for pain). 02/02/20   Meuth, Brooke A, PA-C  polyethylene glycol (MIRALAX / GLYCOLAX) 17 g packet Take 17 g by mouth daily as needed for mild constipation. 02/02/20   Meuth, Brooke A, PA-C  potassium chloride SA  (KLOR-CON) 20 MEQ tablet Take 1 tablet (20 mEq total) by mouth 2 (two) times daily. 06/16/21   Veryl Speak, MD  rosuvastatin (CRESTOR) 20 MG tablet Take 20 mg by mouth daily. 07/30/18   [provider]  vitamin B-12 (CYANOCOBALAMIN) 1000 MCG tablet Take 1 tablet (1,000 mcg total) by mouth daily. Patient not taking: Reported on 06/16/2021 03/05/20   Aline August, MD    Physical Exam: Vitals:   06/24/21 1359 06/24/21 1416 06/24/21 1703 06/24/21 1958  BP: 135/69  108/72 (!) 125/59  Pulse: 99  73 70  Resp: $Remo'16  20 18  'NLdHk$ Temp: 98 F (36.7 C)     TempSrc: Oral     SpO2: 100%  100% 100%  Weight:  50.3 kg    Height:  6' (1.829 m)      Constitutional: NAD, calm, comfortable Vitals:   06/24/21 1359 06/24/21 1416 06/24/21 1703 06/24/21 1958  BP: 135/69  108/72 (!) 125/59  Pulse: 99  73 70  Resp: $Remo'16  20 18  'fEoDp$ Temp: 98 F (36.7 C)     TempSrc: Oral     SpO2: 100%  100% 100%  Weight:  50.3 kg    Height:  6' (1.829 m)     General: Thin frail elderly female. Alert and oriented to self.  Eyes: EOMI, PERRL, conjunctivae normal.  Sclera nonicteric HENT:  [place]/AT, external ears normal.  Nares patent without epistasis.  Mucous membranes are dry Neck: Soft, normal range of motion, supple, no masses, Trachea midline Respiratory: clear to auscultation bilaterally, no wheezing, no crackles. Normal respiratory effort. No accessory muscle use.  Cardiovascular: Regular rate and rhythm, no murmurs / rubs / gallops. No extremity edema. 2+ pedal pulses. Abdomen: Soft, lower abdomen and suprapubic tenderness, nondistended, no rebound or guarding.  No masses palpated. No hepatosplenomegaly. Bowel sounds normoactive. No CVA tenderness Musculoskeletal: FROM. no cyanosis. No joint deformity upper and lower extremities. no contractures. Normal muscle tone.  Skin: Warm, dry, intact no rashes, lesions, ulcers. No induration Neurologic: CN 2-12 grossly intact. Normal speech.  Sensation intact to touch, patella  DTR +1 bilaterally. Strength 4/5 in all extremities. Unsteady gait. Psychiatric: Normal judgment and insight.  Normal mood.    Labs on Admission: I have personally reviewed following labs and imaging studies  CBC: Recent Labs  Lab 06/24/21 1655  WBC 5.6  NEUTROABS 4.2  HGB 13.2  HCT 36.8  MCV 93.2  PLT 945    Basic Metabolic Panel: Recent Labs  Lab 06/24/21 1655  NA 141  K 2.4*  CL 103  CO2 26  GLUCOSE 84  BUN 13  CREATININE 1.31*  CALCIUM 9.9  MG 2.4    GFR: Estimated Creatinine Clearance: 28.6 mL/min (A) (by C-G formula based on SCr of 1.31 mg/dL (H)).  Liver Function Tests: Recent Labs  Lab 06/24/21 1655  AST 22  ALT 14  ALKPHOS 50  BILITOT 0.9  PROT 6.7  ALBUMIN 3.7    Urine analysis:    Component Value Date/Time   COLORURINE YELLOW 06/24/2021 1357   APPEARANCEUR HAZY (A) 06/24/2021 1357   LABSPEC 1.010 06/24/2021 1357   PHURINE 6.0 06/24/2021 1357   GLUCOSEU NEGATIVE 06/24/2021 1357   HGBUR SMALL (A) 06/24/2021 1357   BILIRUBINUR NEGATIVE 06/24/2021 1357   KETONESUR 5 (A) 06/24/2021 1357   PROTEINUR 100 (A) 06/24/2021 1357   NITRITE NEGATIVE 06/24/2021 1357   LEUKOCYTESUR MODERATE (A) 06/24/2021 1357    Radiological Exams on Admission: DG Abdomen 1 View  Result Date: 06/24/2021 CLINICAL DATA:  Decreased intake and bilateral lower abdominal pain. EXAM: ABDOMEN - 1 VIEW COMPARISON:  Feb 29, 2020, CT of the abdomen and pelvis of August of 2022. FINDINGS: Visualized lung bases are clear. Signs of aortic atherosclerosis and prior cholecystectomy. Also with evidence of prior partial colonic resection in the area of the rectosigmoid colon. No signs of bowel obstruction. Stool and gas seen in the area of the rectum. Degenerative changes of the lumbar spine. No acute regional skeletal process on limited assessment. IMPRESSION: No acute findings in the abdomen. Postoperative changes as described. Aortic atherosclerosis. Spinal degenerative changes  Electronically Signed   By: Zetta Bills M.D.   On: 06/24/2021 15:12   DG Knee Complete 4 Views Left  Result Date: 06/24/2021 CLINICAL DATA:  Left knee pain after falling a few days ago. EXAM: LEFT KNEE - COMPLETE 4+ VIEW COMPARISON:  None. FINDINGS: The mineralization and alignment are normal. There is no evidence of acute fracture  or dislocation. There is moderate medial compartment joint space narrowing with osteophytes. The additional joint spaces are preserved. No joint effusion or focal soft tissue abnormality. IMPRESSION: Medial compartment osteoarthritic changes. No acute osseous findings. Electronically Signed   By: Richardean Sale M.D.   On: 06/24/2021 18:40    EKG: Independently reviewed.  EKG shows normal sinus rhythm with anterior septal nonspecific ST changes but no acute ST elevation or depression.  QTc prolonged at 500  Assessment/Plan Principal Problem:   AKI (acute kidney injury)  Ms. Caldas is admitted to telemetry floor.  IVF hydration with LR at 75 ml/hr Recheck electrolytes and renal function in am.  Mildly dehydrated from decreased po intake and vomiting.  Active Problems:   Hypokalemia Magnesium level normal. Potassium repleted. Monitor on telemetry.  Recheck electrolytes in am    UTI (urinary tract infection) Rocephin for antibiotic coverage.     Essential hypertension Continue Norvasc for HTN. Monitor BP    Nausea & vomiting Tigan as needed for nausea&vomiting IVF hydration.     Prolonged QT interval Avoid medications could further prolong QT interval    Dementia  Chronic.  Aricept held with prolonged QT interval    Vertigo Antivert as needed.  Consult PT in morning    DVT prophylaxis: Lovenox for DVT prophylaxis  Code Status:   Full Code. No family present to discuss code status in dementia patient  Family Communication:  No family at bedside.  Disposition Plan:   Patient is from:  Home  Anticipated DC to:  Home vs rehab, to be  determined.  Anticipated DC date:  Anticipate 2 midnight or more stay in the hospital  Admission status:  Inpatient   Yevonne Aline Krishiv Sandler MD [facility]  How to contact the [facility] Attending or Consulting provider [place] or covering provider during after hours [place], for this patient?   Check the care team in [facility] and look for a) attending/consulting [facility] provider listed and b) the [facility] team listed Log into [email or web address] and use [place]'s universal password to access. If you do not have the password, please contact the hospital operator. Locate the [facility] provider you are looking for under [facility] and page to a number that you can be directly reached. If you still have difficulty reaching the provider, please page the [facility] (Director on Call) for the Hospitalists listed on amion for assistance.  06/24/2021, 10:52 PM

## 2021-06-24 NOTE — ED Triage Notes (Signed)
Per EMS- patient was recently hospitalized. Patient did not pick up prescriptions that were ordered.  Patient c/o bilateral lower abdominal pain and patient's daughter reports that the patient has not been eating or drinking. Patient does not remember when her last BM was

## 2021-06-24 NOTE — ED Provider Notes (Signed)
[facility]-EMERGENCY DEPT Provider Note   CSN: [id number] Arrival date & time: 06/24/21  1342     History Chief Complaint  Patient presents with   Abdominal Pain    Samantha Gonzales is a 80 y.o. female.  The history is provided by the patient and medical records. No language interpreter was used.  Abdominal Pain Pain location:  Generalized Pain quality: aching and sharp   Pain radiates to:  Does not radiate Pain severity:  Severe Onset quality:  Gradual Duration:  1 week Timing:  Constant Progression:  Waxing and waning Chronicity:  Chronic Context: not trauma   Relieved by:  Nothing Worsened by:  Nothing Ineffective treatments:  None tried Associated symptoms: fatigue and nausea   Associated symptoms: no chest pain, no chills, no constipation, no cough, no diarrhea, no dysuria, no fever, no shortness of breath and no vomiting       Past Medical History:  Diagnosis Date   Hyperlipidemia    Hypertension    Syncope 10/27/2019    Patient Active Problem List   Diagnosis Date Noted   Severe protein-calorie malnutrition (HCC)    Failure to thrive in adult    Goals of care, counseling/discussion    Palliative care by specialist    DNR (do not resuscitate) discussion    Macrocytic anemia 02/29/2020   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   Protein-calorie malnutrition, severe 01/19/2020   Murmur, heart 01/11/2020   SBO (small bowel obstruction) (HCC) 01/11/2020   Small bowel obstruction (HCC) 12/29/2019   Pressure injury of skin 10/29/2019   Colitis 10/28/2019   AKI (acute kidney injury) (HCC) 10/27/2019   Syncope 10/27/2019   Hypokalemia 10/27/2019   Essential hypertension 10/27/2019   Malnutrition of moderate degree 09/07/2019   Cholecystitis 09/04/2019   Altered mental status 01/18/2018   Altered mental state 01/17/2018    Past Surgical History:  Procedure Laterality Date   CHOLECYSTECTOMY N/A 09/05/2019   Procedure: LAPAROSCOPIC  CHOLECYSTECTOMY WITH INTRAOPERATIVE CHOLANGIOGRAM;  Surgeon: Harriette Bouillon, MD;  Location: [facility] OR;  Service: General;  Laterality: N/A;   ERCP N/A 09/07/2019   Procedure: ENDOSCOPIC RETROGRADE CHOLANGIOPANCREATOGRAPHY (ERCP);  Surgeon: Vida Rigger, MD;  Location: [facility] ENDOSCOPY;  Service: Endoscopy;  Laterality: N/A;   IR RADIOLOGIST EVAL & MGMT  02/14/2020   LAPAROTOMY N/A 01/12/2020   Procedure: EXPLORATORY LAPAROTOMY LYSIS OF ADHESIONS AND SMALL BOWEL OBSTRUCTION;  Surgeon: Harriette Bouillon, MD;  Location: [facility] ORS;  Service: General;  Laterality: N/A;   PANCREATIC STENT PLACEMENT  09/07/2019   Procedure: PANCREATIC STENT PLACEMENT;  Surgeon: Vida Rigger, MD;  Location: [facility] ENDOSCOPY;  Service: Endoscopy;;   REMOVAL OF STONES  09/07/2019   Procedure: REMOVAL OF STONES;  Surgeon: Vida Rigger, MD;  Location: [facility] ENDOSCOPY;  Service: Endoscopy;;   SPHINCTEROTOMY  09/07/2019   Procedure: Dennison Mascot;  Surgeon: Vida Rigger, MD;  Location: [facility] ENDOSCOPY;  Service: Endoscopy;;   TUBAL LIGATION       OB History   No obstetric history on file.     Family History  Problem Relation Age of Onset   Alcoholism Father    CAD Neg Hx     Social History   Tobacco Use   Smoking status: Never   Smokeless tobacco: Current    Types: Chew  Vaping Use   Vaping Use: Never used  Substance Use Topics   Alcohol use: No   Drug use: No    Home Medications Prior to Admission medications   Medication Sig Start  Date End Date Taking? Authorizing Provider  acetaminophen (TYLENOL) 500 MG tablet Take 2 tablets (1,000 mg total) by mouth every 6 (six) hours as needed for moderate pain. 03/05/20   Glade Lloyd, MD  amLODipine (NORVASC) 10 MG tablet Take 10 mg by mouth daily. 09/25/19   [provider]  collagenase (SANTYL) ointment Apply topically daily. Patient not taking: Reported on 06/16/2021 02/03/20   Carlena Bjornstad A, PA-C  donepezil (ARICEPT) 5 MG tablet Take 5 mg by mouth daily. 05/16/21   [provider]  DULoxetine (CYMBALTA) 30 MG capsule Take 30 mg by mouth daily. 04/30/21   [provider]  ferrous sulfate 325 (65 FE) MG tablet Take 1 tablet (325 mg total) by mouth daily with breakfast. Patient not taking: No sig reported 01/20/20   Juliet Rude, PA-C  folic acid (FOLVITE) 1 MG tablet Take 1 tablet (1 mg total) by mouth daily. Patient not taking: No sig reported 01/20/20   Noralee Stain, DO  gabapentin (NEURONTIN) 100 MG capsule Take 2 capsules (200 mg total) by mouth 3 (three) times daily. Patient not taking: No sig reported 01/23/20   Sherrie George, PA-C  ibuprofen (ADVIL) 200 MG tablet You can take 2 tablets every 6 hours as needed for pain.  Follow package directions.  You can buy this over-the-counter at any drugstore. Patient not taking: No sig reported 01/23/20   Sherrie George, PA-C  meclizine (ANTIVERT) 25 MG tablet Take 25 mg by mouth 3 (three) times daily as needed for dizziness. 05/28/21   [provider]  Multiple Vitamin (MULTIVITAMIN WITH MINERALS) TABS tablet Take 1 tablet by mouth daily. Patient not taking: No sig reported 01/19/20   Juliet Rude, PA-C  oxyCODONE 10 MG TABS Take 0.5-1 tablets (5-10 mg total) by mouth every 6 (six) hours as needed for severe pain. Patient taking differently: Take 10 mg by mouth every 6 (six) hours as needed (for pain). 02/02/20   Meuth, Brooke A, PA-C  polyethylene glycol (MIRALAX / GLYCOLAX) 17 g packet Take 17 g by mouth daily as needed for mild constipation. 02/02/20   Meuth, Brooke A, PA-C  potassium chloride SA (KLOR-CON) 20 MEQ tablet Take 1 tablet (20 mEq total) by mouth 2 (two) times daily. 06/16/21   Geoffery Lyons, MD  rosuvastatin (CRESTOR) 20 MG tablet Take 20 mg by mouth daily. 07/30/18   [provider]  vitamin B-12 (CYANOCOBALAMIN) 1000 MCG tablet Take 1 tablet (1,000 mcg total) by mouth daily. Patient not taking: Reported on 06/16/2021 03/05/20   Glade Lloyd, MD    Allergies     Guaifenesin-codeine, Hydrochlorothiazide, and Penicillins  Review of Systems   Review of Systems  Constitutional:  Positive for fatigue. Negative for chills, diaphoresis and fever.  HENT:  Negative for congestion.   Eyes:  Negative for visual disturbance.  Respiratory:  Negative for cough, chest tightness, shortness of breath and wheezing.   Cardiovascular:  Positive for palpitations. Negative for chest pain and leg swelling.  Gastrointestinal:  Positive for abdominal pain and nausea. Negative for constipation, diarrhea and vomiting.  Genitourinary:  Positive for decreased urine volume and urgency. Negative for dysuria, flank pain and frequency.  Musculoskeletal:  Negative for back pain, neck pain and neck stiffness.  Skin:  Negative for rash and wound.  Neurological:  Positive for light-headedness. Negative for syncope, numbness and headaches.  Psychiatric/Behavioral:  Negative for agitation and confusion.   All other systems reviewed and are negative.  Physical Exam Updated Vital Signs  BP 135/69 (BP Location: Right Arm)   Pulse 99   Temp 98 F (36.7 C) (Oral)   Resp 16   Ht 6' (1.829 m)   Wt 50.3 kg   SpO2 100%   BMI 15.04 kg/m   Physical Exam Vitals and nursing note reviewed.  Constitutional:      General: She is not in acute distress.    Appearance: She is well-developed. She is not ill-appearing, toxic-appearing or diaphoretic.  HENT:     Head: Normocephalic and atraumatic.     Nose: No congestion or rhinorrhea.     Mouth/Throat:     Mouth: Mucous membranes are moist.  Eyes:     Extraocular Movements: Extraocular movements intact.     Conjunctiva/sclera: Conjunctivae normal.  Cardiovascular:     Rate and Rhythm: Normal rate and regular rhythm.     Heart sounds: No murmur heard. Pulmonary:     Effort: Pulmonary effort is normal. No respiratory distress.     Breath sounds: Normal breath sounds. No wheezing, rhonchi or rales.  Chest:     Chest wall: No  tenderness.  Abdominal:     General: Abdomen is flat. Bowel sounds are normal. There is no distension.     Palpations: Abdomen is soft.     Tenderness: There is generalized abdominal tenderness. There is no right CVA tenderness, left CVA tenderness or rebound.  Musculoskeletal:        General: No tenderness.     Cervical back: Neck supple.  Skin:    General: Skin is warm and dry.     Capillary Refill: Capillary refill takes less than 2 seconds.     Coloration: Skin is not pale.     Findings: No rash.  Neurological:     General: No focal deficit present.     Mental Status: She is alert.  Psychiatric:        Mood and Affect: Mood normal.    ED Results / Procedures / Treatments   Labs (all labs ordered are listed, but only abnormal results are displayed) Labs Reviewed  COMPREHENSIVE METABOLIC PANEL - Abnormal; Notable for the following components:      Result Value   Potassium 2.4 (*)    Creatinine, Ser 1.31 (*)    GFR, Estimated 42 (*)    All other components within normal limits  LIPASE, BLOOD - Abnormal; Notable for the following components:   Lipase 80 (*)    All other components within normal limits  URINALYSIS, ROUTINE W REFLEX MICROSCOPIC - Abnormal; Notable for the following components:   APPearance HAZY (*)    Hgb urine dipstick SMALL (*)    Ketones, ur 5 (*)    Protein, ur 100 (*)    Leukocytes,Ua MODERATE (*)    Bacteria, UA RARE (*)    All other components within normal limits  ACETAMINOPHEN LEVEL - Abnormal; Notable for the following components:   Acetaminophen (Tylenol), Serum <10 (*)    All other components within normal limits  RESP PANEL BY RT-PCR (FLU A&B, COVID) ARPGX2  URINE CULTURE  CBC WITH DIFFERENTIAL/PLATELET  MAGNESIUM  ETHANOL  AMMONIA  BASIC METABOLIC PANEL  CBC    EKG EKG Interpretation  Date/Time:  Monday June 24 2021 17:12:40 EDT Ventricular Rate:  67 PR Interval:  186 QRS Duration: 98 QT Interval:  474 QTC  Calculation: 500 R Axis:   53 Text Interpretation: Normal sinus rhythm Minimal voltage criteria for LVH, may be normal variant ( Cornell product )  Anteroseptal infarct , age undetermined Abnormal ECG When compared to prior. longer QTc. No STEMI Confirmed by Theda Belfast ([id number]) on 06/24/2021 6:00:03 PM  Radiology DG Abdomen 1 View  Result Date: 06/24/2021 CLINICAL DATA:  Decreased intake and bilateral lower abdominal pain. EXAM: ABDOMEN - 1 VIEW COMPARISON:  Feb 29, 2020, CT of the abdomen and pelvis of August of 2022. FINDINGS: Visualized lung bases are clear. Signs of aortic atherosclerosis and prior cholecystectomy. Also with evidence of prior partial colonic resection in the area of the rectosigmoid colon. No signs of bowel obstruction. Stool and gas seen in the area of the rectum. Degenerative changes of the lumbar spine. No acute regional skeletal process on limited assessment. IMPRESSION: No acute findings in the abdomen. Postoperative changes as described. Aortic atherosclerosis. Spinal degenerative changes Electronically Signed   By: Donzetta Kohut M.D.   On: 06/24/2021 15:12    Procedures Procedures   CRITICAL CARE Performed by: Canary Brim Kennon Encinas Total critical care time: 35 minutes Critical care time was exclusive of separately billable procedures and treating other patients. Critical care was necessary to treat or prevent imminent or life-threatening deterioration. Critical care was time spent personally by me on the following activities: development of treatment plan with patient and/or surrogate as well as nursing, discussions with consultants, evaluation of patient's response to treatment, examination of patient, obtaining history from patient or surrogate, ordering and performing treatments and interventions, ordering and review of laboratory studies, ordering and review of radiographic studies, pulse oximetry and re-evaluation of patient's condition.  Medications Ordered in  ED Medications  amLODipine (NORVASC) tablet 10 mg (has no administration in time range)  rosuvastatin (CRESTOR) tablet 20 mg (has no administration in time range)  meclizine (ANTIVERT) tablet 25 mg (has no administration in time range)  cefTRIAXone (ROCEPHIN) 1 g in sodium chloride 0.9 % 100 mL IVPB (has no administration in time range)  enoxaparin (LOVENOX) injection 30 mg (has no administration in time range)  lactated ringers infusion ( Intravenous New Bag/Given 06/25/21 0011)  acetaminophen (TYLENOL) tablet 650 mg (has no administration in time range)    Or  acetaminophen (TYLENOL) suppository 650 mg (has no administration in time range)  senna-docusate (Senokot-S) tablet 1 tablet (has no administration in time range)  HYDROmorphone (DILAUDID) injection 0.5 mg (0.5 mg Intravenous Given 06/25/21 0008)  potassium chloride 10 mEq in 100 mL IVPB (10 mEq Intravenous New Bag/Given 06/25/21 0021)  promethazine (PHENERGAN) 12.5 mg in sodium chloride 0.9 % 50 mL IVPB (has no administration in time range)  sodium chloride 0.9 % bolus 1,000 mL (0 mLs Intravenous Stopped 06/24/21 1757)  ondansetron (ZOFRAN) injection 4 mg (4 mg Intravenous Given 06/24/21 1705)  fentaNYL (SUBLIMAZE) injection 50 mcg (50 mcg Intravenous Given 06/24/21 1705)  potassium chloride 10 mEq in 100 mL IVPB (0 mEq Intravenous Stopped 06/24/21 2237)  cefTRIAXone (ROCEPHIN) 1 g in sodium chloride 0.9 % 100 mL IVPB (0 g Intravenous Stopped 06/24/21 2127)  fentaNYL (SUBLIMAZE) injection 50 mcg (50 mcg Intravenous Given 06/24/21 1958)  morphine 4 MG/ML injection 4 mg (4 mg Intravenous Given 06/24/21 2057)    ED Course  I have reviewed the triage vital signs and the nursing notes.  Pertinent labs & imaging results that were available during my care of the patient were reviewed by me and considered in my medical decision making (see chart for details).    MDM Rules/Calculators/A&P  Drema HalonMaxine B Harper is a 80 y.o.  female with a past medical history significant for prior cholecystectomy, prior small bowel obstruction, previous colitis, hypertension, and hyperlipidemia who presents for severe abdominal pain, nausea, palpitations, lightheadedness, fatigue, urinary changes, and left knee pain after fall.  Patient reports that she has chronic abdominal pain that has given her trouble for years but she reports that over the last few days it has worsened.  She reports the pain is severe and will barely let me touch her abdomen.  She was seen 8 days ago and had a CT scan which did not show any concerning findings.  Patient was able to go home with a prescription of oral potassium which she is unsure if she has taken.  She was also found to be hypokalemic with a potassium of 2.5 at that time.  She reports a decreased oral intake and is still having lots of nausea.  She reports her abdominal pain is uncontrolled and 2 days ago she had a fall hurting her left knee.  She reports she has chronic knee pain as well.  She reports that she is still having bowel movements and is passing gas.  She denies any chest pain  or shortness of breath but does report the palpitations.  She denies specific back pain.  On exam, lungs are clear and chest is nontender.  Abdomen is very tender and bowel sounds were appreciated.  No flank or back tenderness.  Left knee is tender to palpation but no laceration or deformity seen.  Intact strength and sensation distally.  Intact distal pulses.  Patient had some screening work-up in triage that is still in process.  She did have a abdominal x-ray which did not show evidence of acute obstruction.  Anticipate reassessment after medications to determine if she needs repeat CT scan given the amount of discomfort she is experiencing.  Given the recent electrolyte abnormalities, will repeat labs as well as a urinalysis given urinary symptoms.  We will give fluids for evidence of dehydration with dry mucous  membranes and will determine if she needs potassium repletion here.  Will get EKG and order telemetry monitoring given the recent palpitations in the low potassium at discharge recently  Anticipate reassessment after work-up to determine disposition.  Patient's urinalysis does show concern for UTI.  We will treat with antibiotics.  It appears she has tolerated Rocephin before so we will use that again.  Potassium had dropped compared to last time and given the palpitations, lightheadedness, and worsening hypokalemia with nausea and decreased oral intake, patient is not safe for discharge home.  I spoke with the daughter who agrees and says that the patient is normally able to get up and get around but over the last week has had several falls and is getting lightheaded whenever she sits up or moves.  They do not feel safe with her going home either.  Patient be admitted for hydration, repletion of electrolytes, telemetry monitoring, and treatment of UTI.  Patient given more pain medicine.  Patient agrees with admission.  Final Clinical Impression(s) / ED Diagnoses Final diagnoses:  Palpitations  Dehydration  Hypokalemia  Urinary tract infection without hematuria, site unspecified  Generalized abdominal pain     Clinical Impression: 1. Hypokalemia   2. Palpitations   3. Dehydration   4. Urinary tract infection without hematuria, site unspecified   5. Generalized abdominal pain     Disposition: Admit  This note was prepared with assistance of Dragon voice recognition  software. Occasional wrong-word or sound-a-like substitutions may have occurred due to the inherent limitations of voice recognition software.     Jimesha Rising, Canary Brim, MD 06/25/21 [phone]

## 2021-06-24 NOTE — ED Notes (Signed)
Pt is upset with staff members and saying "nothing has been done." RN explained that pt has had blood work drawn, given IV fluids, nausea, pain medication and IV potassium. RN explained the importance in getting medication. In addition, pt has also had 2 xrays performed. Pt still upset due to being in hall bed. RN explained that there are currently no rooms available and the same care is being performed despite location. Pt given multiple warm blankets since being placed in hall bed and helped with bathroom needs twice. Pt on cardiac monitor. Pt still upset and requesting a room. Charge RN made aware.

## 2021-06-24 NOTE — ED Notes (Signed)
Provider at bedside

## 2021-06-24 NOTE — ED Notes (Signed)
Pt ambulatory to restroom with walker and one person assist

## 2021-06-24 NOTE — ED Notes (Signed)
Tegler, MD notified of critical K of 2.4.

## 2021-06-24 NOTE — ED Notes (Signed)
Pt proceeded to get out of the bed and layed herself onto the floor. Staff assisted pt back to bed. Pt was instructed to please stay in the bed.

## 2021-06-24 NOTE — ED Provider Notes (Signed)
Emergency Medicine Provider Triage Evaluation Note  Samantha Gonzales , a 80 y.o. female  was evaluated in triage.  Pt complains of abd pain, nausea.  Review of Systems  Positive: Abd pain, nausea Negative: fever  Physical Exam  There were no vitals taken for this visit. Gen:   Awake, no distress   Resp:  Normal effort  MSK:   Moves extremities without difficulty  Other:  Dry heaving, abd ttp diffusely, guarding  Medical Decision Making  Medically screening exam initiated at 1:56 PM.  Appropriate orders placed.  Samantha Gonzales was informed that the remainder of the evaluation will be completed by another provider, this initial triage assessment does not replace that evaluation, and the importance of remaining in the ED until their evaluation is complete.     Samantha Gonzales 06/24/21 1356    Samantha Nick, MD 06/25/21 [phone]

## 2021-06-25 DIAGNOSIS — M1712 Unilateral primary osteoarthritis, left knee: Secondary | ICD-10-CM

## 2021-06-25 DIAGNOSIS — M1990 Unspecified osteoarthritis, unspecified site: Secondary | ICD-10-CM

## 2021-06-25 LAB — CBC
HCT: 28.2 % — ABNORMAL LOW (ref 36.0–46.0)
Hemoglobin: 9.9 g/dL — ABNORMAL LOW (ref 12.0–15.0)
MCH: 33.4 pg (ref 26.0–34.0)
MCHC: 35.1 g/dL (ref 30.0–36.0)
MCV: 95.3 fL (ref 80.0–100.0)
Platelets: 158 10*3/uL (ref 150–400)
RBC: 2.96 MIL/uL — ABNORMAL LOW (ref 3.87–5.11)
RDW: 14.6 % (ref 11.5–15.5)
WBC: 5.1 10*3/uL (ref 4.0–10.5)
nRBC: 0 % (ref 0.0–0.2)

## 2021-06-25 LAB — RESP PANEL BY RT-PCR (FLU A&B, COVID) ARPGX2
Influenza A by PCR: NEGATIVE
Influenza B by PCR: NEGATIVE
SARS Coronavirus 2 by RT PCR: NEGATIVE

## 2021-06-25 LAB — FOLATE: Folate: 3.5 ng/mL — ABNORMAL LOW (ref >5.9)

## 2021-06-25 LAB — VITAMIN B12: Vitamin B-12: 408 pg/mL (ref 180–914)

## 2021-06-25 MED ORDER — LORAZEPAM 2 MG/ML IJ SOLN
0.2500 mg | Freq: Once | INTRAMUSCULAR | Status: AC
  Administered 2021-06-25: 0.25 mg via INTRAVENOUS
  Filled 2021-06-25: qty 1

## 2021-06-25 MED ORDER — KETOROLAC TROMETHAMINE 15 MG/ML IJ SOLN
15.0000 mg | Freq: Four times a day (QID) | INTRAMUSCULAR | Status: DC
  Administered 2021-06-25 – 2021-06-26 (×5): 15 mg via INTRAVENOUS
  Filled 2021-06-25 (×6): qty 1

## 2021-06-25 MED ORDER — PROMETHAZINE HCL 25 MG/ML IJ SOLN
12.5000 mg | Freq: Three times a day (TID) | INTRAMUSCULAR | Status: DC | PRN
Start: 2021-06-25 — End: 2021-06-27
  Filled 2021-06-25: qty 0.5

## 2021-06-25 MED ORDER — OXYCODONE HCL 5 MG PO TABS
5.0000 mg | ORAL_TABLET | ORAL | Status: DC | PRN
Start: 2021-06-25 — End: 2021-06-28
  Administered 2021-06-25 – 2021-06-27 (×11): 5 mg via ORAL
  Filled 2021-06-25 (×11): qty 1

## 2021-06-25 MED ORDER — SODIUM CHLORIDE 0.9 % IV SOLN: INTRAVENOUS | Status: DC | PRN

## 2021-06-25 MED ORDER — FENTANYL CITRATE PF 50 MCG/ML IJ SOSY
25.0000 ug | PREFILLED_SYRINGE | Freq: Once | INTRAMUSCULAR | Status: AC
Start: 2021-06-25 — End: 2021-06-25
  Administered 2021-06-25: 25 ug via INTRAVENOUS
  Filled 2021-06-25: qty 1

## 2021-06-25 NOTE — Progress Notes (Signed)
Progress Note    Samantha Gonzales   HMC:[id number]  DOB: 12-09-1940  DOA: 06/24/2021     1  PCP: Eartha Inch, MD  Initial CC: Falling at home  Hospital Course: Samantha Gonzales is a 80 y.o. female with medical history significant for dementia, HTN, HLD who presented for evaluation of dizziness and being off balance when she stands up and has had falls at home.  She has had symptoms for the last week that have progressively worsened.  She has been having nausea and vomiting for the last week and has not been able to tolerate p.o. intake over that time period.  Her daughter finally made her come to the emergency room.   Patient was a poor historian in setting of her underlying dementia.  She had complaints of lower abdominal pain and chronic pain in her left knee. She had no elicited reports of difficulty with urinating or changes in bowel habits. She underwent work-up in the ER and was considered to be dehydrated with a possible UTI.  Patient is not a good historian.  She reports she has some lower abdominal pain and is urinating frequently but does not have dysuria.  States she has not had diarrhea.  She denies any chest pain, cough, shortness of breath, fever or chills. She was started on fluids and antibiotics and admitted for further work-up.  Interval History:  Seen in the ER this morning.  Patient poor historian.  Main complaint was her weakness and falling as well as pain in her left knee which is chronic.  ROS: Constitutional: negative for chills and fevers, Respiratory: negative for cough and sputum, Cardiovascular: negative for chest pain, and Gastrointestinal: negative for abdominal pain  Assessment & Plan: * AKI (acute kidney injury) (HCC) - baseline creatinine ~ 0.7 - patient presents with increase in creat >0.3 mg/dL above baseline, creat increase >1.5x baseline presumed to have occurred within past 7 days PTA - likely 2/2 poor PO intake - continue IVF - BMP in  am  UTI (urinary tract infection) - No obvious symptoms able to be elicited from patient however dementia may be confounding - UA noted with moderate leukocyte esterase, 11-20 WBCs, hyaline casts, rare bacteria - Continue Rocephin  Dementia (HCC) - Safety sitter needed due to impulsive behavior and patient difficult to redirect with high fall risk  Vertigo - Hold off on further meclizine for now; treating dehydration with fluids first  Macrocytic anemia - Check folate and B12 - Baseline hemoglobin approximately 13 g/dL  Osteoarthritis - Left knee imaging significant for osteoarthritis - Continue encouraging Tylenol use - Toradol also ordered  Prolonged QT interval - Avoiding QT prolonging meds as able  Nausea & vomiting - No obvious etiology - Continue Phenergan as needed  Essential hypertension - BP currently controlled.  Will initiate treatment if needed  Hypokalemia - Replete and recheck as needed   Old records reviewed in assessment of this patient  Antimicrobials: Rocephin 06/24/2021 >> current  DVT prophylaxis: enoxaparin (LOVENOX) injection 30 mg Start: 06/25/21 1000   Code Status:   Code Status: Full Code Family Communication:   Disposition Plan: Status is: Inpatient  Remains inpatient appropriate because:IV treatments appropriate due to intensity of illness or inability to take PO and Inpatient level of care appropriate due to severity of illness  Dispo: The patient is from: Home              Anticipated d/c is to:  Pending PT eval  Patient currently is not medically stable to d/c.   Difficult to place patient No      Risk of unplanned readmission score: Unplanned Admission- Pilot do not use: 11.41   Objective: Blood pressure 137/60, pulse (!) 58, temperature 97.8 F (36.6 C), temperature source Oral, resp. rate 16, height 6' (1.829 m), weight 50.3 kg, SpO2 100 %.  Examination: General appearance: alert, cooperative, no distress,  and slowed mentation Head: Normocephalic, without obvious abnormality, atraumatic Eyes:  EOMI Lungs: clear to auscultation bilaterally Heart: regular rate and rhythm and S1, S2 normal Abdomen: normal findings: bowel sounds normal and soft, non-tender Musculoskeletal: Tenderness to palpation in left knee with decreased passive range of motion in knee joint Extremities:  No edema. Skin: mobility and turgor normal Neurologic: Follows commands, moves all 4 extremities  Consultants:    Procedures:    Data Reviewed: I have personally reviewed following labs and imaging studies Results for orders placed or performed during the hospital encounter of 06/24/21 (from the past 24 hour(s))  Resp Panel by RT-PCR (Flu A&B, Covid) Nasopharyngeal Swab     Status: None   Collection Time: 06/24/21  9:16 PM   Specimen: Nasopharyngeal Swab; Nasopharyngeal(NP) swabs in vial transport medium  Result Value Ref Range   SARS Coronavirus 2 by RT PCR NEGATIVE NEGATIVE   Influenza A by PCR NEGATIVE NEGATIVE   Influenza B by PCR NEGATIVE NEGATIVE  CBC     Status: Abnormal   Collection Time: 06/25/21  4:41 AM  Result Value Ref Range   WBC 5.1 4.0 - 10.5 K/uL   RBC 2.96 (L) 3.87 - 5.11 MIL/uL   Hemoglobin 9.9 (L) 12.0 - 15.0 g/dL   HCT 83.4 (L) 19.6 - 22.2 %   MCV 95.3 80.0 - 100.0 fL   MCH 33.4 26.0 - 34.0 pg   MCHC 35.1 30.0 - 36.0 g/dL   RDW 97.9 89.2 - 11.9 %   Platelets 158 150 - 400 K/uL   nRBC 0.0 0.0 - 0.2 %    Recent Results (from the past 240 hour(s))  Resp Panel by RT-PCR (Flu A&B, Covid) Nasopharyngeal Swab     Status: None   Collection Time: 06/24/21  9:16 PM   Specimen: Nasopharyngeal Swab; Nasopharyngeal(NP) swabs in vial transport medium  Result Value Ref Range Status   SARS Coronavirus 2 by RT PCR NEGATIVE NEGATIVE Final    Comment: (NOTE) SARS-CoV-2 target nucleic acids are NOT DETECTED.  The SARS-CoV-2 RNA is generally detectable in upper respiratory specimens during the acute  phase of infection. The lowest concentration of SARS-CoV-2 viral copies this assay can detect is 138 copies/mL. A negative result does not preclude SARS-Cov-2 infection and should not be used as the sole basis for treatment or other patient management decisions. A negative result may occur with  improper specimen collection/handling, submission of specimen other than nasopharyngeal swab, presence of viral mutation(s) within the areas targeted by this assay, and inadequate number of viral copies(<138 copies/mL). A negative result must be combined with clinical observations, patient history, and epidemiological information. The expected result is Negative.  Fact Sheet for Patients:  BloggerCourse.com  Fact Sheet for Healthcare Providers:  SeriousBroker.it  This test is no t yet approved or cleared by the [place] FDA and  has been authorized for detection and/or diagnosis of SARS-CoV-2 by FDA under an Emergency Use Authorization (EUA). This EUA will remain  in effect (meaning this test can be used) for the duration of the COVID-19 declaration under Section  564(b)(1) of the Act, 21 U.S.C.section 360bbb-3(b)(1), unless the authorization is terminated  or revoked sooner.       Influenza A by PCR NEGATIVE NEGATIVE Final   Influenza B by PCR NEGATIVE NEGATIVE Final    Comment: (NOTE) The Xpert Xpress SARS-CoV-2/FLU/RSV plus assay is intended as an aid in the diagnosis of influenza from Nasopharyngeal swab specimens and should not be used as a sole basis for treatment. Nasal washings and aspirates are unacceptable for Xpert Xpress SARS-CoV-2/FLU/RSV testing.  Fact Sheet for Patients: BloggerCourse.com  Fact Sheet for Healthcare Providers: SeriousBroker.it  This test is not yet approved or cleared by the [place] FDA and has been authorized for detection and/or diagnosis of  SARS-CoV-2 by FDA under an Emergency Use Authorization (EUA). This EUA will remain in effect (meaning this test can be used) for the duration of the COVID-19 declaration under Section 564(b)(1) of the Act, 21 U.S.C. section 360bbb-3(b)(1), unless the authorization is terminated or revoked.  Performed at [facility], [address], [place], [place] [address]      Radiology Studies: DG Abdomen 1 View  Result Date: 06/24/2021 CLINICAL DATA:  Decreased intake and bilateral lower abdominal pain. EXAM: ABDOMEN - 1 VIEW COMPARISON:  Feb 29, 2020, CT of the abdomen and pelvis of August of 2022. FINDINGS: Visualized lung bases are clear. Signs of aortic atherosclerosis and prior cholecystectomy. Also with evidence of prior partial colonic resection in the area of the rectosigmoid colon. No signs of bowel obstruction. Stool and gas seen in the area of the rectum. Degenerative changes of the lumbar spine. No acute regional skeletal process on limited assessment. IMPRESSION: No acute findings in the abdomen. Postoperative changes as described. Aortic atherosclerosis. Spinal degenerative changes Electronically Signed   By: Donzetta Kohut M.D.   On: 06/24/2021 15:12   DG Knee Complete 4 Views Left  Result Date: 06/24/2021 CLINICAL DATA:  Left knee pain after falling a few days ago. EXAM: LEFT KNEE - COMPLETE 4+ VIEW COMPARISON:  None. FINDINGS: The mineralization and alignment are normal. There is no evidence of acute fracture or dislocation. There is moderate medial compartment joint space narrowing with osteophytes. The additional joint spaces are preserved. No joint effusion or focal soft tissue abnormality. IMPRESSION: Medial compartment osteoarthritic changes. No acute osseous findings. Electronically Signed   By: Carey Bullocks M.D.   On: 06/24/2021 18:40   DG Knee Complete 4 Views Left  Final Result    DG Abdomen 1 View  Final Result      Scheduled Meds:  amLODipine  10 mg  Oral Daily   enoxaparin (LOVENOX) injection  30 mg Subcutaneous Q24H   ketorolac  15 mg Intravenous Q6H   rosuvastatin  20 mg Oral Daily   PRN Meds: acetaminophen **OR** acetaminophen, oxyCODONE, promethazine (PHENERGAN) injection (IM or IVPB), senna-docusate Continuous Infusions:  cefTRIAXone (ROCEPHIN)  IV     lactated ringers 75 mL/hr at 06/25/21 0011   promethazine (PHENERGAN) injection (IM or IVPB)       LOS: 1 day  Time spent: Greater than 50% of the 35 minute visit was spent in counseling/coordination of care for the patient as laid out in the A&P.   Lewie Chamber, MD [facility] 06/25/2021, 5:52 PM

## 2021-06-25 NOTE — Plan of Care (Signed)
  Problem: Education: Goal: Knowledge of General Education information will improve Description Including pain rating scale, medication(s)/side effects and non-pharmacologic comfort measures Outcome: Progressing   

## 2021-06-25 NOTE — Assessment & Plan Note (Addendum)
-   baseline creatinine ~ 0.7 - patient presents with increase in creat >0.3 mg/dL above baseline, creat increase >1.5x baseline presumed to have occurred within past 7 days PTA - likely 2/2 poor PO intake -Creatinine has responded well to fluids

## 2021-06-25 NOTE — Assessment & Plan Note (Addendum)
-   Left knee imaging significant for osteoarthritis - Continue encouraging Tylenol use

## 2021-06-25 NOTE — Assessment & Plan Note (Signed)
-   BP currently controlled.  Will initiate treatment if needed

## 2021-06-25 NOTE — ED Notes (Signed)
Pt tucked into bed with new hot blankets and three hot packs

## 2021-06-25 NOTE — Assessment & Plan Note (Addendum)
-   Hold off on further meclizine for now given age

## 2021-06-25 NOTE — Plan of Care (Signed)
  Problem: Education: Goal: Knowledge of General Education information will improve Description: Including pain rating scale, medication(s)/side effects and non-pharmacologic comfort measures 06/25/2021 2033 by Rema Fendt, RN Outcome: Progressing 06/25/2021 2032 by Rema Fendt, RN Outcome: Progressing

## 2021-06-25 NOTE — Hospital Course (Addendum)
Samantha Gonzales is a 80 y.o. female with medical history significant for dementia, HTN, HLD who presented for evaluation of dizziness and being off balance when she stands up and has had falls at home.  She has had symptoms for the last week that have progressively worsened.  She has been having nausea and vomiting for the last week and has not been able to tolerate p.o. intake over that time period.  Her daughter finally made her come to the emergency room.   Patient was a poor historian in setting of her underlying dementia.  She had complaints of lower abdominal pain and chronic pain in her left knee. She had no elicited reports of difficulty with urinating or changes in bowel habits. She underwent work-up in the ER and was considered to be dehydrated with a possible UTI.  Patient is not a good historian.  She reports she has some lower abdominal pain and is urinating frequently but does not have dysuria.  States she has not had diarrhea.  She denies any chest pain, cough, shortness of breath, fever or chills. She was started on fluids and antibiotics and admitted for further work-up.  See below for further A&P

## 2021-06-25 NOTE — Assessment & Plan Note (Addendum)
-   Check folate (low 3.5) and B12 (408) - Baseline hemoglobin approximately 13 g/dL -Start oral folate and multivitamin

## 2021-06-25 NOTE — Assessment & Plan Note (Addendum)
-   No obvious symptoms able to be elicited from patient however dementia may be confounding - UA noted with moderate leukocyte esterase, 11-20 WBCs, hyaline casts, rare bacteria - completed 2 days rocephin and 1 day omnicef; course complete  -Insignificant growth on urine culture.

## 2021-06-25 NOTE — Assessment & Plan Note (Signed)
-  Replete and recheck as needed 

## 2021-06-25 NOTE — Assessment & Plan Note (Signed)
-   No obvious etiology - Continue Phenergan as needed

## 2021-06-25 NOTE — Assessment & Plan Note (Addendum)
-   mild per daughter  - initially needed a safety sitter needed due to impulsive behavior and patient difficult to redirect with high fall risk -Family wishes for patient to return home with family at discharge and declines SNF

## 2021-06-25 NOTE — Assessment & Plan Note (Signed)
-   Avoiding QT prolonging meds as able

## 2021-06-26 ENCOUNTER — Inpatient Hospital Stay (HOSPITAL_COMMUNITY): Payer: Medicare PPO

## 2021-06-26 DIAGNOSIS — R1084 Generalized abdominal pain: Secondary | ICD-10-CM

## 2021-06-26 DIAGNOSIS — F0390 Unspecified dementia without behavioral disturbance: Secondary | ICD-10-CM

## 2021-06-26 DIAGNOSIS — D539 Nutritional anemia, unspecified: Secondary | ICD-10-CM

## 2021-06-26 DIAGNOSIS — R109 Unspecified abdominal pain: Secondary | ICD-10-CM

## 2021-06-26 LAB — BASIC METABOLIC PANEL
Anion gap: 9 (ref 5–15)
BUN: 9 mg/dL (ref 8–23)
CO2: 24 mmol/L (ref 22–32)
Calcium: 9.4 mg/dL (ref 8.9–10.3)
Chloride: 107 mmol/L (ref 98–111)
Creatinine, Ser: 1.03 mg/dL — ABNORMAL HIGH (ref 0.44–1.00)
GFR, Estimated: 56 mL/min — ABNORMAL LOW (ref >60)
Glucose, Bld: 74 mg/dL (ref 70–99)
Potassium: 2.4 mmol/L — CL (ref 3.5–5.1)
Sodium: 140 mmol/L (ref 135–145)

## 2021-06-26 LAB — CBC WITH DIFFERENTIAL/PLATELET
Abs Immature Granulocytes: 0.02 10*3/uL (ref 0.00–0.07)
Basophils Absolute: 0 10*3/uL (ref 0.0–0.1)
Basophils Relative: 1 %
Eosinophils Absolute: 0.1 10*3/uL (ref 0.0–0.5)
Eosinophils Relative: 3 %
HCT: 30.6 % — ABNORMAL LOW (ref 36.0–46.0)
Hemoglobin: 10.9 g/dL — ABNORMAL LOW (ref 12.0–15.0)
Immature Granulocytes: 1 %
Lymphocytes Relative: 26 %
Lymphs Abs: 0.9 10*3/uL (ref 0.7–4.0)
MCH: 33.2 pg (ref 26.0–34.0)
MCHC: 35.6 g/dL (ref 30.0–36.0)
MCV: 93.3 fL (ref 80.0–100.0)
Monocytes Absolute: 0.4 10*3/uL (ref 0.1–1.0)
Monocytes Relative: 10 %
Neutro Abs: 2.1 10*3/uL (ref 1.7–7.7)
Neutrophils Relative %: 59 %
Platelets: 142 10*3/uL — ABNORMAL LOW (ref 150–400)
RBC: 3.28 MIL/uL — ABNORMAL LOW (ref 3.87–5.11)
RDW: 14.3 % (ref 11.5–15.5)
WBC: 3.6 10*3/uL — ABNORMAL LOW (ref 4.0–10.5)
nRBC: 0 % (ref 0.0–0.2)

## 2021-06-26 LAB — URINE CULTURE: Culture: <10000 — AB

## 2021-06-26 LAB — MAGNESIUM: Magnesium: 2 mg/dL (ref 1.7–2.4)

## 2021-06-26 MED ORDER — POTASSIUM CHLORIDE CRYS ER 20 MEQ PO TBCR
40.0000 meq | EXTENDED_RELEASE_TABLET | ORAL | Status: AC
  Administered 2021-06-26 (×3): 40 meq via ORAL
  Filled 2021-06-26 (×3): qty 2

## 2021-06-26 MED ORDER — ENOXAPARIN SODIUM 40 MG/0.4ML IJ SOSY
40.0000 mg | PREFILLED_SYRINGE | INTRAMUSCULAR | Status: DC
  Administered 2021-06-26 – 2021-06-28 (×3): 40 mg via SUBCUTANEOUS
  Filled 2021-06-26 (×3): qty 0.4

## 2021-06-26 MED ORDER — FOLIC ACID 1 MG PO TABS
1.0000 mg | ORAL_TABLET | Freq: Every day | ORAL | Status: DC
  Administered 2021-06-26 – 2021-06-28 (×3): 1 mg via ORAL
  Filled 2021-06-26 (×3): qty 1

## 2021-06-26 MED ORDER — POTASSIUM CHLORIDE CRYS ER 20 MEQ PO TBCR
40.0000 meq | EXTENDED_RELEASE_TABLET | Freq: Once | ORAL | Status: AC
  Administered 2021-06-26: 40 meq via ORAL
  Filled 2021-06-26: qty 2

## 2021-06-26 MED ORDER — HALOPERIDOL LACTATE 5 MG/ML IJ SOLN: 2.0000 mg | Freq: Four times a day (QID) | INTRAMUSCULAR | Status: DC | PRN

## 2021-06-26 MED ORDER — HYDROXYZINE HCL 25 MG PO TABS
25.0000 mg | ORAL_TABLET | Freq: Three times a day (TID) | ORAL | Status: DC | PRN
  Administered 2021-06-26 – 2021-06-28 (×3): 25 mg via ORAL
  Filled 2021-06-26 (×3): qty 1

## 2021-06-26 MED ORDER — ADULT MULTIVITAMIN W/MINERALS CH
1.0000 | ORAL_TABLET | Freq: Every day | ORAL | Status: DC
  Administered 2021-06-26 – 2021-06-28 (×3): 1 via ORAL
  Filled 2021-06-26 (×3): qty 1

## 2021-06-26 NOTE — TOC Initial Note (Signed)
Transition of Care [facility]) - Initial/Assessment Note    Patient Details  Name: Samantha Gonzales MRN: [id number] Date of Birth: 1941-07-01  [facility] CM/SW Contact:    Lanier Clam, RN Phone Number: 06/26/2021, 12:28 PM  Clinical Narrative: Patient has dementia.Spoke to son Ivar Drape about d/c plans-Lives with family-Willie, & dtr Donna-had HHC in past, unsure of agency-await PT recc. Has own transport home.                Expected Discharge Plan: Home w Home Health Services Barriers to Discharge: Continued Medical Work up   Patient Goals and CMS Choice Patient states their goals for this hospitalization and ongoing recovery are:: go home CMS Medicare.gov Compare Post Acute Care list provided to:: Patient Represenative (must comment) Choice offered to / list presented to : Adult Children  Expected Discharge Plan and Services Expected Discharge Plan: Home w Home Health Services   Discharge Planning Services: CM Consult Post Acute Care Choice: Home Health Living arrangements for the past 2 months: Single Family Home                                      Prior Living Arrangements/Services Living arrangements for the past 2 months: Single Family Home Lives with:: Adult Children   Do you feel safe going back to the place where you live?: Yes      Need for Family Participation in Patient Care: No (Comment) Care giver support system in place?: Yes (comment) Current home services: DME (rw) Criminal Activity/Legal Involvement Pertinent to Current Situation/Hospitalization: No - Comment as needed  Activities of Daily Living Home Assistive Devices/Equipment: Walker (specify type), Other (Comment) (tub/shower unit, standard height toilet) ADL Screening (condition at time of admission) Patient's cognitive ability adequate to safely complete daily activities?: Yes Is the patient deaf or have difficulty hearing?: No Does the patient have difficulty seeing, even when  wearing glasses/contacts?: No Does the patient have difficulty concentrating, remembering, or making decisions?: Yes (short term memory loss) Patient able to express need for assistance with ADLs?: Yes Does the patient have difficulty dressing or bathing?: Yes Independently performs ADLs?: No Communication: Independent Dressing (OT): Needs assistance Is this a change from baseline?: Change from baseline, expected to last >3 days Grooming: Needs assistance Is this a change from baseline?: Pre-admission baseline Feeding: Needs assistance Is this a change from baseline?: Pre-admission baseline Bathing: Needs assistance Is this a change from baseline?: Pre-admission baseline Toileting: Needs assistance Is this a change from baseline?: Change from baseline, expected to last >3days In/Out Bed: Needs assistance Is this a change from baseline?: Change from baseline, expected to last >3 days Walks in Home: Needs assistance Is this a change from baseline?: Change from baseline, expected to last >3 days Does the patient have difficulty walking or climbing stairs?: Yes (secondary to weakness) Weakness of Legs: Both Weakness of Arms/Hands: None  Permission Sought/Granted Permission sought to share information with : Case Manager Permission granted to share information with : Yes, Verbal Permission Granted  Share Information with NAME: Care Manager     Permission granted to share info w Relationship: Catharine Kettlewell son [phone]     Emotional Assessment Appearance:: Appears stated age Attitude/Demeanor/Rapport: Gracious Affect (typically observed): Accepting Orientation: : Oriented to Self Alcohol / Substance Use: Not Applicable Psych Involvement: No (comment)  Admission diagnosis:  Palpitations [R00.2] Dehydration [E86.0] Hypokalemia [E87.6] Generalized abdominal pain [  R10.84] Urinary tract infection without hematuria, site unspecified [N39.0] Patient Active Problem List    Diagnosis Date Noted   Osteoarthritis 06/25/2021   UTI (urinary tract infection) 06/24/2021   Nausea & vomiting 06/24/2021   Prolonged QT interval 06/24/2021   Dementia (HCC) 06/24/2021   Vertigo 06/24/2021   Severe protein-calorie malnutrition (HCC)    Failure to thrive in adult    Goals of care, counseling/discussion    Palliative care by specialist    DNR (do not resuscitate) discussion    Macrocytic anemia 02/29/2020   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   Protein-calorie malnutrition, severe 01/19/2020   Murmur, heart 01/11/2020   SBO (small bowel obstruction) (HCC) 01/11/2020   Small bowel obstruction (HCC) 12/29/2019   Pressure injury of skin 10/29/2019   Colitis 10/28/2019   AKI (acute kidney injury) (HCC) 10/27/2019   Syncope 10/27/2019   Hypokalemia 10/27/2019   Essential hypertension 10/27/2019   Malnutrition of moderate degree 09/07/2019   Cholecystitis 09/04/2019   Altered mental status 01/18/2018   Altered mental state 01/17/2018   PCP:  Eartha Inch, MD Pharmacy:   [facility] [phone] [place], [place] - [address] AT [facility] [address] [place] [address] Phone: [phone] Fax: [phone]     Social Determinants of Health (SDOH) Interventions    Readmission Risk Interventions Readmission Risk Prevention Plan 03/02/2020 02/03/2020  Transportation Screening Complete -  PCP or Specialist Appt within 3-5 Days Not Complete Complete  Not Complete comments poss plan for SNF -  HRI or Home Care Consult Complete Complete  Social Work Consult for Recovery Care Planning/Counseling Complete Complete  Palliative Care Screening Complete Not Applicable  Medication Review Oceanographer) Referral to Pharmacy Complete  Some recent data might be hidden

## 2021-06-26 NOTE — Progress Notes (Signed)
Progress Note    TELA KOTECKI   AJG:[id number]  DOB: 1941/07/16  DOA: 06/24/2021     2  PCP: Eartha Inch, MD  Initial CC: Falling at home  Hospital Course: Samantha Gonzales is a 80 y.o. female with medical history significant for dementia, HTN, HLD who presented for evaluation of dizziness and being off balance when she stands up and has had falls at home.  She has had symptoms for the last week that have progressively worsened.  She has been having nausea and vomiting for the last week and has not been able to tolerate p.o. intake over that time period.  Her daughter finally made her come to the emergency room.   Patient was a poor historian in setting of her underlying dementia.  She had complaints of lower abdominal pain and chronic pain in her left knee. She had no elicited reports of difficulty with urinating or changes in bowel habits. She underwent work-up in the ER and was considered to be dehydrated with a possible UTI.  Patient is not a good historian.  She reports she has some lower abdominal pain and is urinating frequently but does not have dysuria.  States she has not had diarrhea.  She denies any chest pain, cough, shortness of breath, fever or chills. She was started on fluids and antibiotics and admitted for further work-up.  Interval History:  No events overnight.  Sitter still present bedside this morning.  Patient still has obvious underlying dementia.  Her biggest complaint is focused on her abdominal pain.  There has been no clear etiology to explain.  She otherwise appears to be resting comfortably in bed in no distress.  Tentative plan is for discharging home with home health into the care of her family.  ROS: Constitutional: negative for chills and fevers, Respiratory: negative for cough and sputum, Cardiovascular: negative for chest pain, and Gastrointestinal: negative for abdominal pain  Assessment & Plan: * AKI (acute kidney injury) (HCC) - baseline  creatinine ~ 0.7 - patient presents with increase in creat >0.3 mg/dL above baseline, creat increase >1.5x baseline presumed to have occurred within past 7 days PTA - likely 2/2 poor PO intake - continue IVF - BMP in am -Creatinine has responded well to fluids  UTI (urinary tract infection) - No obvious symptoms able to be elicited from patient however dementia may be confounding - UA noted with moderate leukocyte esterase, 11-20 WBCs, hyaline casts, rare bacteria - Continue Rocephin -Insignificant growth on urine culture.  Complete course after 3 doses  Abdominal pain - Recurrent issue for patient and she has fixated on this issue.  Underwent CT abdomen/pelvis on 06/16/2021 which showed possible constipation but no other explanation for her abdominal pain.  She has no true focal tenderness nor other obvious etiologies to explain her pain - Repeated abdominal x-ray on 06/26/2021 and shows nonobstructive bowel gas pattern -No further work-up at this time.  She is tolerating a diet well  Dementia (HCC) - Safety sitter needed due to impulsive behavior and patient difficult to redirect with high fall risk -Family wishes for patient to return home with family at discharge and declines SNF it appears  Vertigo - Hold off on further meclizine for now; treating dehydration with fluids first  Macrocytic anemia - Check folate (low 3.5) and B12 (408) - Baseline hemoglobin approximately 13 g/dL -Start oral folate and multivitamin  Osteoarthritis - Left knee imaging significant for osteoarthritis - Continue encouraging Tylenol use - Toradol also  ordered  Prolonged QT interval - Avoiding QT prolonging meds as able  Nausea & vomiting - No obvious etiology - Continue Phenergan as needed  Essential hypertension - BP currently controlled.  Will initiate treatment if needed  Hypokalemia - Replete and recheck as needed   Old records reviewed in assessment of this  patient  Antimicrobials: Rocephin 06/24/2021 >> 06/26/2021  DVT prophylaxis: enoxaparin (LOVENOX) injection 40 mg Start: 06/26/21 1000   Code Status:   Code Status: Full Code Family Communication:   Disposition Plan: Status is: Inpatient  Remains inpatient appropriate because:IV treatments appropriate due to intensity of illness or inability to take PO and Inpatient level of care appropriate due to severity of illness  Dispo: The patient is from: Home              Anticipated d/c is to:  Pending PT eval              Patient currently is not medically stable to d/c.   Difficult to place patient No  Risk of unplanned readmission score: Unplanned Admission- Pilot do not use: 11.95   Objective: Blood pressure (!) 131/52, pulse 71, temperature 98.6 F (37 C), temperature source Oral, resp. rate 16, height 6' (1.829 m), weight 50.3 kg, SpO2 100 %.  Examination: General appearance: alert, cooperative, no distress, and slowed mentation Head: Normocephalic, without obvious abnormality, atraumatic Eyes:  EOMI Lungs: clear to auscultation bilaterally Heart: regular rate and rhythm and S1, S2 normal Abdomen:  Tender to palpation throughout as evidenced by grimacing.  Bowel sounds present.  Soft.  Nondistended. Musculoskeletal: Tenderness to palpation in left knee with decreased passive range of motion in knee joint Extremities:  No edema. Skin: mobility and turgor normal Neurologic: Follows commands, moves all 4 extremities  Consultants:    Procedures:    Data Reviewed: I have personally reviewed following labs and imaging studies Results for orders placed or performed during the hospital encounter of 06/24/21 (from the past 24 hour(s))  Folate     Status: Abnormal   Collection Time: 06/25/21  8:22 PM  Result Value Ref Range   Folate 3.5 (L) >5.9 ng/mL  Vitamin B12     Status: None   Collection Time: 06/25/21  8:22 PM  Result Value Ref Range   Vitamin B-12 408 180 - 914 pg/mL   Basic metabolic panel     Status: Abnormal   Collection Time: 06/26/21  4:51 AM  Result Value Ref Range   Sodium 140 135 - 145 mmol/L   Potassium 2.4 (LL) 3.5 - 5.1 mmol/L   Chloride 107 98 - 111 mmol/L   CO2 24 22 - 32 mmol/L   Glucose, Bld 74 70 - 99 mg/dL   BUN 9 8 - 23 mg/dL   Creatinine, Ser 4.09 (H) 0.44 - 1.00 mg/dL   Calcium 9.4 8.9 - 81.1 mg/dL   GFR, Estimated 56 (L) >60 mL/min   Anion gap 9 5 - 15  CBC with Differential/Platelet     Status: Abnormal   Collection Time: 06/26/21  4:51 AM  Result Value Ref Range   WBC 3.6 (L) 4.0 - 10.5 K/uL   RBC 3.28 (L) 3.87 - 5.11 MIL/uL   Hemoglobin 10.9 (L) 12.0 - 15.0 g/dL   HCT 91.4 (L) 78.2 - 95.6 %   MCV 93.3 80.0 - 100.0 fL   MCH 33.2 26.0 - 34.0 pg   MCHC 35.6 30.0 - 36.0 g/dL   RDW 21.3 08.6 - 57.8 %  Platelets 142 (L) 150 - 400 K/uL   nRBC 0.0 0.0 - 0.2 %   Neutrophils Relative % 59 %   Neutro Abs 2.1 1.7 - 7.7 K/uL   Lymphocytes Relative 26 %   Lymphs Abs 0.9 0.7 - 4.0 K/uL   Monocytes Relative 10 %   Monocytes Absolute 0.4 0.1 - 1.0 K/uL   Eosinophils Relative 3 %   Eosinophils Absolute 0.1 0.0 - 0.5 K/uL   Basophils Relative 1 %   Basophils Absolute 0.0 0.0 - 0.1 K/uL   Immature Granulocytes 1 %   Abs Immature Granulocytes 0.02 0.00 - 0.07 K/uL  Magnesium     Status: None   Collection Time: 06/26/21  4:51 AM  Result Value Ref Range   Magnesium 2.0 1.7 - 2.4 mg/dL    Recent Results (from the past 240 hour(s))  Urine Culture     Status: Abnormal   Collection Time: 06/24/21  4:26 PM   Specimen: Urine, Clean Catch  Result Value Ref Range Status   Specimen Description   Final    URINE, CLEAN CATCH Performed at [facility], [address] [place], [place] [address]    Special Requests   Final    NONE Performed at [facility], [address] [place], [place] [address]    Culture (A)  Final    <10,000 COLONIES/mL INSIGNIFICANT GROWTH Performed at [facility], [address] [place], [place] [address]    Report Status 06/26/2021 FINAL  Final  Resp Panel by RT-PCR (Flu A&B, Covid) Nasopharyngeal Swab     Status: None   Collection Time: 06/24/21  9:16 PM   Specimen: Nasopharyngeal Swab; Nasopharyngeal(NP) swabs in vial transport medium  Result Value Ref Range Status   SARS Coronavirus 2 by RT PCR NEGATIVE NEGATIVE Final    Comment: (NOTE) SARS-CoV-2 target nucleic acids are NOT DETECTED.  The SARS-CoV-2 RNA is generally detectable in upper respiratory specimens during the acute phase of infection. The lowest concentration of SARS-CoV-2 viral copies this assay can detect is 138 copies/mL. A negative result does not preclude SARS-Cov-2 infection and should not be used as the sole basis for treatment or other patient management decisions. A negative result may occur with  improper specimen collection/handling, submission of specimen other than nasopharyngeal swab, presence of viral mutation(s) within the areas targeted by this assay, and inadequate number of viral copies(<138 copies/mL). A negative result must be combined with clinical observations, patient history, and epidemiological information. The expected result is Negative.  Fact Sheet for Patients:  BloggerCourse.comhttps://www.fda.gov/media/152166/download  Fact Sheet for Healthcare Providers:  SeriousBroker.ithttps://www.fda.gov/media/152162/download  This test is no t yet approved or cleared by the [place] FDA and  has been authorized for detection and/or diagnosis of SARS-CoV-2 by FDA under an Emergency Use Authorization (EUA). This EUA will remain  in effect (meaning this test can be used) for the duration of the COVID-19 declaration under Section 564(b)(1) of the Act, 21 U.S.C.section 360bbb-3(b)(1), unless the authorization is terminated  or revoked sooner.       Influenza A by PCR NEGATIVE NEGATIVE Final   Influenza B by PCR NEGATIVE NEGATIVE Final    Comment: (NOTE) The Xpert Xpress  SARS-CoV-2/FLU/RSV plus assay is intended as an aid in the diagnosis of influenza from Nasopharyngeal swab specimens and should not be used as a sole basis for treatment. Nasal washings and aspirates are unacceptable for Xpert Xpress SARS-CoV-2/FLU/RSV testing.  Fact Sheet for Patients: BloggerCourse.comhttps://www.fda.gov/media/152166/download  Fact Sheet for Healthcare Providers: SeriousBroker.it  This test is not yet approved or cleared by the [place] FDA and has been authorized for detection and/or diagnosis of SARS-CoV-2 by FDA under an Emergency Use Authorization (EUA). This EUA will remain in effect (meaning this test can be used) for the duration of the COVID-19 declaration under Section 564(b)(1) of the Act, 21 U.S.C. section 360bbb-3(b)(1), unless the authorization is terminated or revoked.  Performed at [facility], [address], [place], [place] [address]      Radiology Studies: DG Knee Complete 4 Views Left  Result Date: 06/24/2021 CLINICAL DATA:  Left knee pain after falling a few days ago. EXAM: LEFT KNEE - COMPLETE 4+ VIEW COMPARISON:  None. FINDINGS: The mineralization and alignment are normal. There is no evidence of acute fracture or dislocation. There is moderate medial compartment joint space narrowing with osteophytes. The additional joint spaces are preserved. No joint effusion or focal soft tissue abnormality. IMPRESSION: Medial compartment osteoarthritic changes. No acute osseous findings. Electronically Signed   By: Carey Bullocks M.D.   On: 06/24/2021 18:40   DG Abd Portable 1V  Result Date: 06/26/2021 CLINICAL DATA:  Abdominal pain EXAM: PORTABLE ABDOMEN - 1 VIEW COMPARISON:  None. FINDINGS: The bowel gas pattern is normal. Degenerative disc disease of the lower lumbar spine. Aortic atherosclerosis. Cholecystectomy clips. No radio-opaque calculi. IMPRESSION: Nonobstructive bowel gas pattern. Electronically Signed   By:  Allegra Lai M.D.   On: 06/26/2021 12:25   DG Abd Portable 1V  Final Result    DG Knee Complete 4 Views Left  Final Result    DG Abdomen 1 View  Final Result      Scheduled Meds:  amLODipine  10 mg Oral Daily   enoxaparin (LOVENOX) injection  40 mg Subcutaneous Q24H   folic acid  1 mg Oral Daily   ketorolac  15 mg Intravenous Q6H   multivitamin with minerals  1 tablet Oral Daily   potassium chloride  40 mEq Oral Q4H   rosuvastatin  20 mg Oral Daily   PRN Meds: sodium chloride, acetaminophen **OR** acetaminophen, oxyCODONE, promethazine (PHENERGAN) injection (IM or IVPB), senna-docusate Continuous Infusions:  sodium chloride     cefTRIAXone (ROCEPHIN)  IV 1 g (06/25/21 2155)   lactated ringers 75 mL/hr at 06/26/21 1402   promethazine (PHENERGAN) injection (IM or IVPB)       LOS: 2 days  Time spent: Greater than 50% of the 35 minute visit was spent in counseling/coordination of care for the patient as laid out in the A&P.   Lewie Chamber, MD [facility] 06/26/2021, 3:17 PM

## 2021-06-26 NOTE — Assessment & Plan Note (Addendum)
-   Recurrent issue for patient and she has fixated on this issue.  Underwent CT abdomen/pelvis on 06/16/2021 which showed possible constipation but no other explanation for her abdominal pain.  She has no true focal tenderness nor other obvious etiologies to explain her pain - Repeated abdominal x-ray on 06/26/2021 and shows nonobstructive bowel gas pattern -No further work-up at this time.  She is tolerating a diet well -Also discussed with her daughter, [name].  Patient has had this complaint for years with extensive work-up.

## 2021-06-26 NOTE — Progress Notes (Signed)
PT Cancellation Note  Patient Details Name: Samantha Gonzales MRN: [id number] DOB: [birth year]   Cancelled Treatment:    Reason Eval/Treat Not Completed: Other (comment)pt declines, "not today baby, it is not happening". Will continue efforts to complete PT eval     A Rosie Place 06/26/2021, 3:47 PM

## 2021-06-27 DIAGNOSIS — E876 Hypokalemia: Secondary | ICD-10-CM

## 2021-06-27 LAB — CBC WITH DIFFERENTIAL/PLATELET
Abs Immature Granulocytes: 0.02 10*3/uL (ref 0.00–0.07)
Basophils Absolute: 0 10*3/uL (ref 0.0–0.1)
Basophils Relative: 1 %
Eosinophils Absolute: 0.1 10*3/uL (ref 0.0–0.5)
Eosinophils Relative: 2 %
HCT: 29.9 % — ABNORMAL LOW (ref 36.0–46.0)
Hemoglobin: 10.6 g/dL — ABNORMAL LOW (ref 12.0–15.0)
Immature Granulocytes: 1 %
Lymphocytes Relative: 26 %
Lymphs Abs: 0.9 10*3/uL (ref 0.7–4.0)
MCH: 33.4 pg (ref 26.0–34.0)
MCHC: 35.5 g/dL (ref 30.0–36.0)
MCV: 94.3 fL (ref 80.0–100.0)
Monocytes Absolute: 0.3 10*3/uL (ref 0.1–1.0)
Monocytes Relative: 9 %
Neutro Abs: 2.1 10*3/uL (ref 1.7–7.7)
Neutrophils Relative %: 61 %
Platelets: 162 10*3/uL (ref 150–400)
RBC: 3.17 MIL/uL — ABNORMAL LOW (ref 3.87–5.11)
RDW: 14.6 % (ref 11.5–15.5)
WBC: 3.5 10*3/uL — ABNORMAL LOW (ref 4.0–10.5)
nRBC: 0 % (ref 0.0–0.2)

## 2021-06-27 LAB — BASIC METABOLIC PANEL
Anion gap: 6 (ref 5–15)
BUN: 6 mg/dL — ABNORMAL LOW (ref 8–23)
CO2: 25 mmol/L (ref 22–32)
Calcium: 9.7 mg/dL (ref 8.9–10.3)
Chloride: 111 mmol/L (ref 98–111)
Creatinine, Ser: 0.95 mg/dL (ref 0.44–1.00)
GFR, Estimated: >60 mL/min (ref >60)
Glucose, Bld: 99 mg/dL (ref 70–99)
Potassium: 3 mmol/L — ABNORMAL LOW (ref 3.5–5.1)
Sodium: 142 mmol/L (ref 135–145)

## 2021-06-27 LAB — MAGNESIUM: Magnesium: 2 mg/dL (ref 1.7–2.4)

## 2021-06-27 MED ORDER — POTASSIUM CHLORIDE CRYS ER 20 MEQ PO TBCR
40.0000 meq | EXTENDED_RELEASE_TABLET | ORAL | Status: AC
  Administered 2021-06-27 (×2): 40 meq via ORAL
  Filled 2021-06-27 (×2): qty 2

## 2021-06-27 MED ORDER — CEFDINIR 300 MG PO CAPS
600.0000 mg | ORAL_CAPSULE | Freq: Once | ORAL | Status: AC
  Administered 2021-06-27: 600 mg via ORAL
  Filled 2021-06-27: qty 2

## 2021-06-27 MED ORDER — LIDOCAINE VISCOUS HCL 2 % MT SOLN
15.0000 mL | Freq: Once | OROMUCOSAL | Status: AC
  Administered 2021-06-27: 15 mL via ORAL
  Filled 2021-06-27: qty 15

## 2021-06-27 MED ORDER — ALUM & MAG HYDROXIDE-SIMETH 200-200-20 MG/5ML PO SUSP
30.0000 mL | Freq: Once | ORAL | Status: AC
  Administered 2021-06-27: 30 mL via ORAL
  Filled 2021-06-27: qty 30

## 2021-06-27 MED ORDER — HALOPERIDOL LACTATE 5 MG/ML IJ SOLN
2.0000 mg | Freq: Four times a day (QID) | INTRAMUSCULAR | Status: DC | PRN
  Administered 2021-06-27: 2 mg via INTRAMUSCULAR
  Filled 2021-06-27: qty 1

## 2021-06-27 NOTE — Progress Notes (Signed)
Progress Note    Samantha Gonzales   TMA:[id number]  DOB: [birth year]  DOA: 06/24/2021     3  PCP: Eartha Inch, MD  Initial CC: Falling at home  Hospital Course: Samantha Gonzales is a 80 y.o. female with medical history significant for dementia, HTN, HLD who presented for evaluation of dizziness and being off balance when she stands up and has had falls at home.  She has had symptoms for the last week that have progressively worsened.  She has been having nausea and vomiting for the last week and has not been able to tolerate p.o. intake over that time period.  Her daughter finally made her come to the emergency room.   Patient was a poor historian in setting of her underlying dementia.  She had complaints of lower abdominal pain and chronic pain in her left knee. She had no elicited reports of difficulty with urinating or changes in bowel habits. She underwent work-up in the ER and was considered to be dehydrated with a possible UTI.  Patient is not a good historian.  She reports she has some lower abdominal pain and is urinating frequently but does not have dysuria.  States she has not had diarrhea.  She denies any chest pain, cough, shortness of breath, fever or chills. She was started on fluids and antibiotics and admitted for further work-up.  Interval History:  No events overnight.  Patient still complaining of her abdominal pain.  Called and spoke with her daughter this afternoon as well who also stated that patient has complained of this abdominal pain for several years and has had extensive negative work-up previously. Tentative plan is for discharge home tomorrow.   ROS: Constitutional: negative for chills and fevers, Respiratory: negative for cough and sputum, Cardiovascular: negative for chest pain, and Gastrointestinal: negative for abdominal pain  Assessment & Plan: * AKI (acute kidney injury) (HCC) - baseline creatinine ~ 0.7 - patient presents with increase in creat  >0.3 mg/dL above baseline, creat increase >1.5x baseline presumed to have occurred within past 7 days PTA - likely 2/2 poor PO intake - continue IVF - BMP in am -Creatinine has responded well to fluids  UTI (urinary tract infection)-resolved as of 06/27/2021 - No obvious symptoms able to be elicited from patient however dementia may be confounding - UA noted with moderate leukocyte esterase, 11-20 WBCs, hyaline casts, rare bacteria - completed 2 days rocephin and 1 day omnicef; course complete  -Insignificant growth on urine culture.   Abdominal pain - Recurrent issue for patient and she has fixated on this issue.  Underwent CT abdomen/pelvis on 06/16/2021 which showed possible constipation but no other explanation for her abdominal pain.  She has no true focal tenderness nor other obvious etiologies to explain her pain - Repeated abdominal x-ray on 06/26/2021 and shows nonobstructive bowel gas pattern -No further work-up at this time.  She is tolerating a diet well -Also discussed with her daughter, [name].  Patient has had this complaint for years with extensive work-up.  Dementia (HCC) - mild per daughter  - initially needed a safety sitter needed due to impulsive behavior and patient difficult to redirect with high fall risk -Family wishes for patient to return home with family at discharge and declines SNF  Vertigo - Hold off on further meclizine for now; treating dehydration with fluids first  Macrocytic anemia - Check folate (low 3.5) and B12 (408) - Baseline hemoglobin approximately 13 g/dL -Start oral folate and multivitamin  Osteoarthritis - Left knee imaging significant for osteoarthritis - Continue encouraging Tylenol use - Toradol also ordered  Prolonged QT interval - Avoiding QT prolonging meds as able  Nausea & vomiting - No obvious etiology - Continue Phenergan as needed  Essential hypertension - BP currently controlled.  Will initiate treatment if  needed  Hypokalemia - Replete and recheck as needed   Old records reviewed in assessment of this patient  Antimicrobials: Rocephin 06/24/2021 >> 06/25/21 Omnicef 9/1 x 1  DVT prophylaxis: enoxaparin (LOVENOX) injection 40 mg Start: 06/26/21 1000   Code Status:   Code Status: Full Code Family Communication: [name], daughter   Disposition Plan: Status is: Inpatient  Remains inpatient appropriate because:IV treatments appropriate due to intensity of illness or inability to take PO and Inpatient level of care appropriate due to severity of illness  Dispo: The patient is from: Home              Anticipated d/c is to: Home              Patient currently is not medically stable to d/c.   Difficult to place patient No  Risk of unplanned readmission score: Unplanned Admission- Pilot do not use: 12.74   Objective: Blood pressure 112/63, pulse 79, temperature 97.6 F (36.4 C), temperature source Oral, resp. rate 16, height 6' (1.829 m), weight 50.3 kg, SpO2 100 %.  Examination: General appearance: alert, cooperative, no distress, and slowed mentation Head: Normocephalic, without obvious abnormality, atraumatic Eyes:  EOMI Lungs: clear to auscultation bilaterally Heart: regular rate and rhythm and S1, S2 normal Abdomen:  Tender to palpation throughout as evidenced by grimacing.  Bowel sounds present.  Soft.  Nondistended. Musculoskeletal: Tenderness to palpation in left knee with decreased passive range of motion in knee joint Extremities:  No edema. Skin: mobility and turgor normal Neurologic: Follows commands, moves all 4 extremities  Consultants:    Procedures:    Data Reviewed: I have personally reviewed following labs and imaging studies Results for orders placed or performed during the hospital encounter of 06/24/21 (from the past 24 hour(s))  Basic metabolic panel     Status: Abnormal   Collection Time: 06/27/21  4:50 AM  Result Value Ref Range   Sodium 142 135 - 145  mmol/L   Potassium 3.0 (L) 3.5 - 5.1 mmol/L   Chloride 111 98 - 111 mmol/L   CO2 25 22 - 32 mmol/L   Glucose, Bld 99 70 - 99 mg/dL   BUN 6 (L) 8 - 23 mg/dL   Creatinine, Ser 4.94 0.44 - 1.00 mg/dL   Calcium 9.7 8.9 - 49.6 mg/dL   GFR, Estimated >75 >91 mL/min   Anion gap 6 5 - 15  CBC with Differential/Platelet     Status: Abnormal   Collection Time: 06/27/21  4:50 AM  Result Value Ref Range   WBC 3.5 (L) 4.0 - 10.5 K/uL   RBC 3.17 (L) 3.87 - 5.11 MIL/uL   Hemoglobin 10.6 (L) 12.0 - 15.0 g/dL   HCT 63.8 (L) 46.6 - 59.9 %   MCV 94.3 80.0 - 100.0 fL   MCH 33.4 26.0 - 34.0 pg   MCHC 35.5 30.0 - 36.0 g/dL   RDW 35.7 01.7 - 79.3 %   Platelets 162 150 - 400 K/uL   nRBC 0.0 0.0 - 0.2 %   Neutrophils Relative % 61 %   Neutro Abs 2.1 1.7 - 7.7 K/uL   Lymphocytes Relative 26 %   Lymphs Abs 0.9  0.7 - 4.0 K/uL   Monocytes Relative 9 %   Monocytes Absolute 0.3 0.1 - 1.0 K/uL   Eosinophils Relative 2 %   Eosinophils Absolute 0.1 0.0 - 0.5 K/uL   Basophils Relative 1 %   Basophils Absolute 0.0 0.0 - 0.1 K/uL   Immature Granulocytes 1 %   Abs Immature Granulocytes 0.02 0.00 - 0.07 K/uL  Magnesium     Status: None   Collection Time: 06/27/21  4:50 AM  Result Value Ref Range   Magnesium 2.0 1.7 - 2.4 mg/dL    Recent Results (from the past 240 hour(s))  Urine Culture     Status: Abnormal   Collection Time: 06/24/21  4:26 PM   Specimen: Urine, Clean Catch  Result Value Ref Range Status   Specimen Description   Final    URINE, CLEAN CATCH Performed at [facility], [address] [place], [place] [address]    Special Requests   Final    NONE Performed at [facility], [address] [place], [place] [address]    Culture (A)  Final    <10,000 COLONIES/mL INSIGNIFICANT GROWTH Performed at [facility], [address] [place], [place] [address]    Report Status 06/26/2021 FINAL  Final  Resp Panel by RT-PCR (Flu A&B, Covid)  Nasopharyngeal Swab     Status: None   Collection Time: 06/24/21  9:16 PM   Specimen: Nasopharyngeal Swab; Nasopharyngeal(NP) swabs in vial transport medium  Result Value Ref Range Status   SARS Coronavirus 2 by RT PCR NEGATIVE NEGATIVE Final    Comment: (NOTE) SARS-CoV-2 target nucleic acids are NOT DETECTED.  The SARS-CoV-2 RNA is generally detectable in upper respiratory specimens during the acute phase of infection. The lowest concentration of SARS-CoV-2 viral copies this assay can detect is 138 copies/mL. A negative result does not preclude SARS-Cov-2 infection and should not be used as the sole basis for treatment or other patient management decisions. A negative result may occur with  improper specimen collection/handling, submission of specimen other than nasopharyngeal swab, presence of viral mutation(s) within the areas targeted by this assay, and inadequate number of viral copies(<138 copies/mL). A negative result must be combined with clinical observations, patient history, and epidemiological information. The expected result is Negative.  Fact Sheet for Patients:  BloggerCourse.comhttps://www.fda.gov/media/152166/download  Fact Sheet for Healthcare Providers:  SeriousBroker.ithttps://www.fda.gov/media/152162/download  This test is no t yet approved or cleared by the [place] FDA and  has been authorized for detection and/or diagnosis of SARS-CoV-2 by FDA under an Emergency Use Authorization (EUA). This EUA will remain  in effect (meaning this test can be used) for the duration of the COVID-19 declaration under Section 564(b)(1) of the Act, 21 U.S.C.section 360bbb-3(b)(1), unless the authorization is terminated  or revoked sooner.       Influenza A by PCR NEGATIVE NEGATIVE Final   Influenza B by PCR NEGATIVE NEGATIVE Final    Comment: (NOTE) The Xpert Xpress SARS-CoV-2/FLU/RSV plus assay is intended as an aid in the diagnosis of influenza from Nasopharyngeal swab specimens and should not be  used as a sole basis for treatment. Nasal washings and aspirates are unacceptable for Xpert Xpress SARS-CoV-2/FLU/RSV testing.  Fact Sheet for Patients: BloggerCourse.comhttps://www.fda.gov/media/152166/download  Fact Sheet for Healthcare Providers: SeriousBroker.ithttps://www.fda.gov/media/152162/download  This test is not yet approved or cleared by the [place] FDA and has been authorized for detection and/or diagnosis of SARS-CoV-2 by FDA under an Emergency Use Authorization (EUA). This EUA will remain  in effect (meaning this test can be used) for the duration of the COVID-19 declaration under Section 564(b)(1) of the Act, 21 U.S.C. section 360bbb-3(b)(1), unless the authorization is terminated or revoked.  Performed at [facility], [address] [place], [place] [address]      Radiology Studies: DG Abd Portable 1V  Result Date: 06/26/2021 CLINICAL DATA:  Abdominal pain EXAM: PORTABLE ABDOMEN - 1 VIEW COMPARISON:  None. FINDINGS: The bowel gas pattern is normal. Degenerative disc disease of the lower lumbar spine. Aortic atherosclerosis. Cholecystectomy clips. No radio-opaque calculi. IMPRESSION: Nonobstructive bowel gas pattern. Electronically Signed   By: Allegra Lai M.D.   On: 06/26/2021 12:25   DG Abd Portable 1V  Final Result    DG Knee Complete 4 Views Left  Final Result    DG Abdomen 1 View  Final Result      Scheduled Meds:  amLODipine  10 mg Oral Daily   enoxaparin (LOVENOX) injection  40 mg Subcutaneous Q24H   folic acid  1 mg Oral Daily   multivitamin with minerals  1 tablet Oral Daily   rosuvastatin  20 mg Oral Daily   PRN Meds: acetaminophen **OR** acetaminophen, haloperidol lactate, hydrOXYzine, oxyCODONE, senna-docusate Continuous Infusions:     LOS: 3 days  Time spent: Greater than 50% of the 35 minute visit was spent in counseling/coordination of care for the patient as laid out in the A&P.   Lewie Chamber, MD [facility] 06/27/2021, 2:24  PM

## 2021-06-27 NOTE — Evaluation (Signed)
Physical Therapy Evaluation Patient Details Name: Samantha Gonzales MRN: [id number] DOB: [birth year] Today's Date: 06/27/2021   History of Present Illness  80 y.o. female with medical history significant for dementia, HTN, HLD, syncope who presented for evaluation of dizziness and being off balance when she stands up and has had falls at home and admitted 06/24/21 for AKI (acute kidney injury) and UTI.  Clinical Impression  Pt admitted with above diagnosis.  Pt currently with functional limitations due to the deficits listed below (see PT Problem List). Pt will benefit from skilled PT to increase their independence and safety with mobility to allow discharge to the venue listed below.  Pt reluctantly agreeable due to focusing on "doctor said I could go home."  Pt assisted with ambulating in hallway and required min assist for stability.  Pt also reported dizziness (see mobility section below).  Pt reports falls at home which she attempts to hide from her daughter.  Will attempt to assist pt with mobility in preparation for d/c.  Pt with hx of dementia so uncertain if pt will retain information however will recommend HHPT in case family believes this may assist with safely mobilizing at home.  Pt would benefit from 24/7 supervision/assist due to falls.     Follow Up Recommendations Supervision/Assistance - 24 hour;Home health PT    Equipment Recommendations  Rolling walker with 5" wheels    Recommendations for Other Services       Precautions / Restrictions Precautions Precautions: Fall      Mobility  Bed Mobility Overal bed mobility: Modified Independent                  Transfers Overall transfer level: Needs assistance Equipment used: None Transfers: Sit to/from Stand Sit to Stand: Min assist         General transfer comment: assist to steady with rise  Ambulation/Gait Ambulation/Gait assistance: Min assist Gait Distance (Feet): 120 Feet Assistive device: 1 person hand  held assist Gait Pattern/deviations: Step-through pattern;Decreased stride length;Narrow base of support Gait velocity: decr   General Gait Details: min assist for stabilizing pt throughout gait; pt reports dizziness; BP 112/63 mmHg, HR 79 bpm (RN notified)  Stairs            Wheelchair Mobility    Modified Rankin (Stroke Patients Only)       Balance Overall balance assessment: Needs assistance         Standing balance support: Single extremity supported Standing balance-Leahy Scale: Poor Standing balance comment: reliant on external support                             Pertinent Vitals/Pain Pain Assessment: No/denies pain    Home Living Family/patient expects to be discharged to:: Private residence Living Arrangements: Children Available Help at Discharge: Family;Available PRN/intermittently Type of Home: House Home Access: Stairs to enter Entrance Stairs-Rails: Right Entrance Stairs-Number of Steps: 3 Home Layout: One level Home Equipment: Walker - 2 wheels      Prior Function Level of Independence: Needs assistance   Gait / Transfers Assistance Needed: pt denies using assistive device, does report falls but tries to hide them from her family  ADL's / Homemaking Assistance Needed: likely requires cues/assist due to dementia  Comments: pt poor historian; home information taken from previous admission     Hand Dominance        Extremity/Trunk Assessment        Lower Extremity  Assessment Lower Extremity Assessment: Generalized weakness    Cervical / Trunk Assessment Cervical / Trunk Assessment:  (cachectic appearance)  Communication   Communication: Other (comment) (dysarthric speech at times)  Cognition Arousal/Alertness: Awake/alert Behavior During Therapy: Restless Overall Cognitive Status: History of cognitive impairments - at baseline                                 General Comments: hx dementia, pt wants to go  home      General Comments      Exercises     Assessment/Plan    PT Assessment Patient needs continued PT services  PT Problem List Decreased strength;Decreased mobility;Decreased activity tolerance;Decreased balance;Decreased knowledge of use of DME;Decreased safety awareness       PT Treatment Interventions Gait training;DME instruction;Therapeutic exercise;Balance training;Functional mobility training;Therapeutic activities;Patient/family education    PT Goals (Current goals can be found in the Care Plan section)  Acute Rehab PT Goals PT Goal Formulation: Patient unable to participate in goal setting Time For Goal Achievement: 07/11/21 Potential to Achieve Goals: Good    Frequency Min 2X/week   Barriers to discharge        Co-evaluation               AM-PAC PT "6 Clicks" Mobility  Outcome Measure Help needed turning from your back to your side while in a flat bed without using bedrails?: A Little Help needed moving from lying on your back to sitting on the side of a flat bed without using bedrails?: A Little Help needed moving to and from a bed to a chair (including a wheelchair)?: A Little Help needed standing up from a chair using your arms (e.g., wheelchair or bedside chair)?: A Little Help needed to walk in hospital room?: A Little Help needed climbing 3-5 steps with a railing? : A Little 6 Click Score: 18    End of Session Equipment Utilized During Treatment: Gait belt Activity Tolerance: Patient tolerated treatment well Patient left: in bed;with call bell/phone within reach;with nursing/sitter in room (sitting EOB with sitter) Nurse Communication: Mobility status PT Visit Diagnosis: Difficulty in walking, not elsewhere classified (R26.2);Unsteadiness on feet (R26.81)    Time: 1100-1113 PT Time Calculation (min) (ACUTE ONLY): 13 min   Charges:   PT Evaluation $PT Eval Low Complexity: 1 Low        Kati PT, DPT Acute Rehabilitation  Services Pager: [phone] Office: [phone]   Maida Sale E 06/27/2021, 12:23 PM

## 2021-06-27 NOTE — Progress Notes (Signed)
IV team unable to get IV access after 3 attempts. EJ attempted by ED nurse but unsuccessful. NP on call made aware.

## 2021-06-28 LAB — CBC WITH DIFFERENTIAL/PLATELET
Abs Immature Granulocytes: 0.08 10*3/uL — ABNORMAL HIGH (ref 0.00–0.07)
Basophils Absolute: 0.1 10*3/uL (ref 0.0–0.1)
Basophils Relative: 1 %
Eosinophils Absolute: 0.1 10*3/uL (ref 0.0–0.5)
Eosinophils Relative: 2 %
HCT: 32.2 % — ABNORMAL LOW (ref 36.0–46.0)
Hemoglobin: 11.3 g/dL — ABNORMAL LOW (ref 12.0–15.0)
Immature Granulocytes: 2 %
Lymphocytes Relative: 28 %
Lymphs Abs: 1.1 10*3/uL (ref 0.7–4.0)
MCH: 33.3 pg (ref 26.0–34.0)
MCHC: 35.1 g/dL (ref 30.0–36.0)
MCV: 95 fL (ref 80.0–100.0)
Monocytes Absolute: 0.4 10*3/uL (ref 0.1–1.0)
Monocytes Relative: 10 %
Neutro Abs: 2.1 10*3/uL (ref 1.7–7.7)
Neutrophils Relative %: 57 %
Platelets: 147 10*3/uL — ABNORMAL LOW (ref 150–400)
RBC: 3.39 MIL/uL — ABNORMAL LOW (ref 3.87–5.11)
RDW: 15.1 % (ref 11.5–15.5)
WBC: 3.8 10*3/uL — ABNORMAL LOW (ref 4.0–10.5)
nRBC: 0 % (ref 0.0–0.2)

## 2021-06-28 LAB — BASIC METABOLIC PANEL
Anion gap: 5 (ref 5–15)
BUN: 5 mg/dL — ABNORMAL LOW (ref 8–23)
CO2: 22 mmol/L (ref 22–32)
Calcium: 9.5 mg/dL (ref 8.9–10.3)
Chloride: 113 mmol/L — ABNORMAL HIGH (ref 98–111)
Creatinine, Ser: 1.01 mg/dL — ABNORMAL HIGH (ref 0.44–1.00)
GFR, Estimated: 57 mL/min — ABNORMAL LOW (ref >60)
Glucose, Bld: 74 mg/dL (ref 70–99)
Potassium: 4 mmol/L (ref 3.5–5.1)
Sodium: 140 mmol/L (ref 135–145)

## 2021-06-28 LAB — MAGNESIUM: Magnesium: 2 mg/dL (ref 1.7–2.4)

## 2021-06-28 MED ORDER — OXYCODONE HCL 5 MG PO TABS
5.0000 mg | ORAL_TABLET | ORAL | Status: DC | PRN
  Administered 2021-06-28: 10 mg via ORAL
  Administered 2021-06-28: 5 mg via ORAL
  Filled 2021-06-28: qty 1
  Filled 2021-06-28: qty 2

## 2021-06-28 MED ORDER — FOLIC ACID 1 MG PO TABS: 1.0000 mg | ORAL_TABLET | Freq: Every day | ORAL | 2 refills | Status: AC

## 2021-06-28 MED ORDER — FENTANYL CITRATE PF 50 MCG/ML IJ SOSY: 12.5000 ug | PREFILLED_SYRINGE | Freq: Once | INTRAMUSCULAR | Status: DC

## 2021-06-28 NOTE — TOC Transition Note (Signed)
Transition of Care [facility]) - CM/SW Discharge Note   Patient Details  Name: Samantha Gonzales MRN: [id number] Date of Birth: 12/23/1940  Transition of [facility]) CM/SW Contact:  Darleene Cleaver, LCSW Phone Number: 06/28/2021, 12:23 PM   Clinical Narrative:     PT recommended home health, for patient, CSW spoke to patient's daughter [name] and offered home health for patient.  Per [name], they don't feel like they need it right now, but if they do will follow up with her PCP.  CSW signing off please reconsult for other social work needs.   Final next level of care: Home/Self Care Barriers to Discharge: Barriers Unresolved (comment)   Patient Goals and CMS Choice Patient states their goals for this hospitalization and ongoing recovery are:: Patient plans to return back home. CMS Medicare.gov Compare Post Acute Care list provided to:: Patient Represenative (must comment) Choice offered to / list presented to : Adult Children  Discharge Placement                       Discharge Plan and Services   Discharge Planning Services: CM Consult Post Acute Care Choice: Home Health                    HH Arranged: Refused HH          Social Determinants of Health (SDOH) Interventions     Readmission Risk Interventions Readmission Risk Prevention Plan 03/02/2020 02/03/2020  Transportation Screening Complete -  PCP or Specialist Appt within 3-5 Days Not Complete Complete  Not Complete comments poss plan for SNF -  HRI or Home Care Consult Complete Complete  Social Work Consult for Recovery Care Planning/Counseling Complete Complete  Palliative Care Screening Complete Not Applicable  Medication Review Oceanographer) Referral to Pharmacy Complete  Some recent data might be hidden

## 2021-06-28 NOTE — Progress Notes (Signed)
Patient was getting agitated and wondering out in the halls. Nurse gave PRN hydroxyzine. Patient was still getting agitated, wondering the halls, yelling at staff, and getting aggressive. Staff had to call security on patient even with a Recruitment consultant. Notified the on call provider and on call provider gave the okay to give PRN Haldol. Patient was still agitated, but the PRN Haldol final took effect, and patient settled down.

## 2021-06-28 NOTE — Plan of Care (Signed)
  Problem: Activity: Goal: Risk for activity intolerance will decrease Outcome: Progressing   Problem: Elimination: Goal: Will not experience complications related to urinary retention Outcome: Progressing   Problem: Pain Managment: Goal: General experience of comfort will improve Outcome: Progressing   Problem: Safety: Goal: Ability to remain free from injury will improve Outcome: Progressing   Problem: Skin Integrity: Goal: Risk for impaired skin integrity will decrease Outcome: Progressing   

## 2021-06-28 NOTE — Discharge Summary (Signed)
Physician Discharge Summary   Samantha Gonzales KGM:[id number] DOB: [birth year] DOA: 06/24/2021  PCP: Eartha Inch, MD  Admit date: 06/24/2021 Discharge date:  06/28/2021  Admitted From: home Disposition:  home Discharging physician: Lewie Chamber, MD  Recommendations for Outpatient Follow-up:  Follow up folic acid level as needed  Home Health:  Equipment/Devices:   Patient discharged to home in Discharge Condition: stable Risk of unplanned readmission score: Unplanned Admission- Pilot do not use: 13.69  CODE STATUS: Full Diet recommendation:  Diet Orders (From admission, onward)     Start     Ordered   06/26/21 0703  DIET SOFT Room service appropriate? Yes; Fluid consistency: Thin  Diet effective now       Question Answer Comment  Room service appropriate? Yes   Fluid consistency: Thin      06/26/21 0703            Hospital Course: Samantha Gonzales is a 80 y.o. female with medical history significant for dementia, HTN, HLD who presented for evaluation of dizziness and being off balance when she stands up and has had falls at home.  She has had symptoms for the last week that have progressively worsened.  She has been having nausea and vomiting for the last week and has not been able to tolerate p.o. intake over that time period.  Her daughter finally made her come to the emergency room.   Patient was a poor historian in setting of her underlying dementia.  She had complaints of lower abdominal pain and chronic pain in her left knee. She had no elicited reports of difficulty with urinating or changes in bowel habits. She underwent work-up in the ER and was considered to be dehydrated with a possible UTI.  Patient is not a good historian.  She reports she has some lower abdominal pain and is urinating frequently but does not have dysuria.  States she has not had diarrhea.  She denies any chest pain, cough, shortness of breath, fever or chills. She was started on fluids and  antibiotics and admitted for further work-up.  See below for further A&P  * AKI (acute kidney injury) (HCC)-resolved as of 06/28/2021 - baseline creatinine ~ 0.7 - patient presents with increase in creat >0.3 mg/dL above baseline, creat increase >1.5x baseline presumed to have occurred within past 7 days PTA - likely 2/2 poor PO intake -Creatinine has responded well to fluids  UTI (urinary tract infection)-resolved as of 06/27/2021 - No obvious symptoms able to be elicited from patient however dementia may be confounding - UA noted with moderate leukocyte esterase, 11-20 WBCs, hyaline casts, rare bacteria - completed 2 days rocephin and 1 day omnicef; course complete  -Insignificant growth on urine culture.   Abdominal pain - Recurrent issue for patient and she has fixated on this issue.  Underwent CT abdomen/pelvis on 06/16/2021 which showed possible constipation but no other explanation for her abdominal pain.  She has no true focal tenderness nor other obvious etiologies to explain her pain - Repeated abdominal x-ray on 06/26/2021 and shows nonobstructive bowel gas pattern -No further work-up at this time.  She is tolerating a diet well -Also discussed with her daughter, Samantha Gonzales.  Patient has had this complaint for years with extensive work-up.  Dementia (HCC) - mild per daughter  - initially needed a safety sitter needed due to impulsive behavior and patient difficult to redirect with high fall risk -Family wishes for patient to return home with family at discharge and  declines SNF  Vertigo - Hold off on further meclizine for now given age  Macrocytic anemia - Check folate (low 3.5) and B12 (408) - Baseline hemoglobin approximately 13 g/dL -Start oral folate and multivitamin  Osteoarthritis - Left knee imaging significant for osteoarthritis - Continue encouraging Tylenol use  Prolonged QT interval - Avoiding QT prolonging meds as able  Nausea & vomiting - No obvious etiology -  Continue Phenergan as needed  Essential hypertension - BP currently controlled.  Will initiate treatment if needed  Hypokalemia - Replete and recheck as needed    The patient's chronic medical conditions were treated accordingly per the patient's home medication regimen except as noted.  On day of discharge, patient was felt deemed stable for discharge. Patient/family member advised to call PCP or come back to ER if needed.   Principal Diagnosis: AKI (acute kidney injury) [facility])  Discharge Diagnoses: Active Hospital Problems   Diagnosis Date Noted   Abdominal pain 06/26/2021    Priority: Medium   Dementia (HCC) 06/24/2021    Priority: Medium   Vertigo 06/24/2021    Priority: Low   Macrocytic anemia 02/29/2020    Priority: Low   Osteoarthritis 06/25/2021   Nausea & vomiting 06/24/2021   Prolonged QT interval 06/24/2021   Hypokalemia 10/27/2019   Essential hypertension 10/27/2019    Resolved Hospital Problems   Diagnosis Date Noted Date Resolved   AKI (acute kidney injury) (HCC) 10/27/2019 06/28/2021    Priority: High   UTI (urinary tract infection) 06/24/2021 06/27/2021    Priority: High    Discharge Instructions     Increase activity slowly   Complete by: As directed       Allergies as of 06/28/2021       Reactions   Guaifenesin-codeine    Other reaction(s): Unknown   Hydrochlorothiazide    Other reaction(s): severe muscle aches   Penicillins Hives, Itching, Rash   Has patient had a PCN reaction causing immediate rash, facial/tongue/throat swelling, SOB or lightheadedness with hypotension: Yes Has patient had a PCN reaction causing severe rash involving mucus membranes or skin necrosis: yes Has patient had a PCN reaction that required hospitalization: unk Has patient had a PCN reaction occurring within the last 10 years: unk If all of the above answers are "NO", then may proceed with Cephalosporin use.        Medication List     STOP taking these  medications    collagenase ointment Commonly known as: SANTYL   ferrous sulfate 325 (65 FE) MG tablet   gabapentin 100 MG capsule Commonly known as: NEURONTIN   ibuprofen 200 MG tablet Commonly known as: ADVIL   multivitamin with minerals Tabs tablet   vitamin B-12 1000 MCG tablet Commonly known as: CYANOCOBALAMIN       TAKE these medications    acetaminophen 500 MG tablet Commonly known as: TYLENOL Take 2 tablets (1,000 mg total) by mouth every 6 (six) hours as needed for moderate pain.   amLODipine 10 MG tablet Commonly known as: NORVASC Take 10 mg by mouth daily.   donepezil 5 MG tablet Commonly known as: ARICEPT Take 5 mg by mouth daily.   DULoxetine 30 MG capsule Commonly known as: CYMBALTA Take 30 mg by mouth daily.   folic acid 1 MG tablet Commonly known as: FOLVITE Take 1 tablet (1 mg total) by mouth daily.   meclizine 25 MG tablet Commonly known as: ANTIVERT Take 25 mg by mouth 3 (three) times daily as needed for dizziness.  Oxycodone HCl 10 MG Tabs Take 0.5-1 tablets (5-10 mg total) by mouth every 6 (six) hours as needed for severe pain. What changed:  how much to take reasons to take this   polyethylene glycol 17 g packet Commonly known as: MIRALAX / GLYCOLAX Take 17 g by mouth daily as needed for mild constipation.   potassium chloride SA 20 MEQ tablet Commonly known as: KLOR-CON Take 1 tablet (20 mEq total) by mouth 2 (two) times daily.   rosuvastatin 20 MG tablet Commonly known as: CRESTOR Take 20 mg by mouth daily.        Allergies  Allergen Reactions   Guaifenesin-Codeine     Other reaction(s): Unknown   Hydrochlorothiazide     Other reaction(s): severe muscle aches   Penicillins Hives, Itching and Rash    Has patient had a PCN reaction causing immediate rash, facial/tongue/throat swelling, SOB or lightheadedness with hypotension: Yes Has patient had a PCN reaction causing severe rash involving mucus membranes or skin  necrosis: yes Has patient had a PCN reaction that required hospitalization: unk Has patient had a PCN reaction occurring within the last 10 years: unk If all of the above answers are "NO", then may proceed with Cephalosporin use.     Consultations:   Discharge Exam: BP 127/67 (BP Location: Right Arm)   Pulse 72   Temp 98 F (36.7 C) (Oral)   Resp 18   Ht 6' (1.829 m)   Wt 50.3 kg   SpO2 100%   BMI 15.04 kg/m  General appearance: alert, cooperative, no distress Head: Normocephalic, without obvious abnormality, atraumatic Eyes:  EOMI Lungs: clear to auscultation bilaterally Heart: regular rate and rhythm and S1, S2 normal Abdomen:  Tender to palpation throughout as evidenced by grimacing.  Bowel sounds present.  Soft.  Nondistended. Musculoskeletal: Tenderness to palpation in left knee with decreased passive range of motion in knee joint Extremities:  No edema. Skin: mobility and turgor normal Neurologic: Follows commands, moves all 4 extremities  The results of significant diagnostics from this hospitalization (including imaging, microbiology, ancillary and laboratory) are listed below for reference.   Microbiology: Recent Results (from the past 240 hour(s))  Urine Culture     Status: Abnormal   Collection Time: 06/24/21  4:26 PM   Specimen: Urine, Clean Catch  Result Value Ref Range Status   Specimen Description   Final    URINE, CLEAN CATCH Performed at [facility], [address], [place], [place] [address]    Special Requests   Final    NONE Performed at [facility], [address] [place], [place] [address]    Culture (A)  Final    <10,000 COLONIES/mL INSIGNIFICANT GROWTH Performed at [facility], [address] [place], [place] [address]    Report Status 06/26/2021 FINAL  Final  Resp Panel by RT-PCR (Flu A&B, Covid) Nasopharyngeal Swab     Status: None   Collection Time: 06/24/21  9:16 PM   Specimen:  Nasopharyngeal Swab; Nasopharyngeal(NP) swabs in vial transport medium  Result Value Ref Range Status   SARS Coronavirus 2 by RT PCR NEGATIVE NEGATIVE Final    Comment: (NOTE) SARS-CoV-2 target nucleic acids are NOT DETECTED.  The SARS-CoV-2 RNA is generally detectable in upper respiratory specimens during the acute phase of infection. The lowest concentration of SARS-CoV-2 viral copies this assay can detect is 138 copies/mL. A negative result does not preclude SARS-Cov-2 infection and should not be used as the sole  basis for treatment or other patient management decisions. A negative result may occur with  improper specimen collection/handling, submission of specimen other than nasopharyngeal swab, presence of viral mutation(s) within the areas targeted by this assay, and inadequate number of viral copies(<138 copies/mL). A negative result must be combined with clinical observations, patient history, and epidemiological information. The expected result is Negative.  Fact Sheet for Patients:  BloggerCourse.com  Fact Sheet for Healthcare Providers:  SeriousBroker.it  This test is no t yet approved or cleared by the [place] FDA and  has been authorized for detection and/or diagnosis of SARS-CoV-2 by FDA under an Emergency Use Authorization (EUA). This EUA will remain  in effect (meaning this test can be used) for the duration of the COVID-19 declaration under Section 564(b)(1) of the Act, 21 U.S.C.section 360bbb-3(b)(1), unless the authorization is terminated  or revoked sooner.       Influenza A by PCR NEGATIVE NEGATIVE Final   Influenza B by PCR NEGATIVE NEGATIVE Final    Comment: (NOTE) The Xpert Xpress SARS-CoV-2/FLU/RSV plus assay is intended as an aid in the diagnosis of influenza from Nasopharyngeal swab specimens and should not be used as a sole basis for treatment. Nasal washings and aspirates are unacceptable for  Xpert Xpress SARS-CoV-2/FLU/RSV testing.  Fact Sheet for Patients: BloggerCourse.com  Fact Sheet for Healthcare Providers: SeriousBroker.it  This test is not yet approved or cleared by the [place] FDA and has been authorized for detection and/or diagnosis of SARS-CoV-2 by FDA under an Emergency Use Authorization (EUA). This EUA will remain in effect (meaning this test can be used) for the duration of the COVID-19 declaration under Section 564(b)(1) of the Act, 21 U.S.C. section 360bbb-3(b)(1), unless the authorization is terminated or revoked.  Performed at [facility], [address] [place], [place] [address]      Labs: BNP (last 3 results) No results for input(s): BNP in the last 8760 hours. Basic Metabolic Panel: Recent Labs  Lab 06/24/21 1655 06/26/21 0451 06/27/21 0450 06/28/21 0443  NA 141 140 142 140  K 2.4* 2.4* 3.0* 4.0  CL 103 107 111 113*  CO2 GLUCOSE 84 74 99 74  BUN 13 9 6* 5*  CREATININE 1.31* 1.03* 0.95 1.01*  CALCIUM 9.9 9.4 9.7 9.5  MG 2.4 2.0 2.0 2.0   Liver Function Tests: Recent Labs  Lab 06/24/21 1655  AST 22  ALT 14  ALKPHOS 50  BILITOT 0.9  PROT 6.7  ALBUMIN 3.7   Recent Labs  Lab 06/24/21 1655  LIPASE 80*   Recent Labs  Lab 06/24/21 1627  AMMONIA 12   CBC: Recent Labs  Lab 06/24/21 1655 06/25/21 0441 06/26/21 0451 06/27/21 0450 06/28/21 0659  WBC 5.6 5.1 3.6* 3.5* 3.8*  NEUTROABS 4.2  --  2.1 2.1 2.1  HGB 13.2 9.9* 10.9* 10.6* 11.3*  HCT 36.8 28.2* 30.6* 29.9* 32.2*  MCV 93.2 95.3 93.3 94.3 95.0  PLT 202 158 142* 162 147*   Cardiac Enzymes: No results for input(s): CKTOTAL, CKMB, CKMBINDEX, TROPONINI in the last 168 hours. BNP: Invalid input(s): POCBNP CBG: No results for input(s): GLUCAP in the last 168 hours. D-Dimer No results for input(s): DDIMER in the last 72 hours. Hgb A1c No results for input(s): HGBA1C in the  last 72 hours. Lipid Profile No results for input(s): CHOL, HDL, LDLCALC, TRIG, CHOLHDL, LDLDIRECT in the last 72 hours. Thyroid function studies No results for input(s): TSH, T4TOTAL, T3FREE, THYROIDAB in  the last 72 hours.  Invalid input(s): FREET3 Anemia work up Recent Labs    06/25/21 2022  VITAMINB12 408  FOLATE 3.5*   Urinalysis    Component Value Date/Time   COLORURINE YELLOW 06/24/2021 1357   APPEARANCEUR HAZY (A) 06/24/2021 1357   LABSPEC 1.010 06/24/2021 1357   PHURINE 6.0 06/24/2021 1357   GLUCOSEU NEGATIVE 06/24/2021 1357   HGBUR SMALL (A) 06/24/2021 1357   BILIRUBINUR NEGATIVE 06/24/2021 1357   KETONESUR 5 (A) 06/24/2021 1357   PROTEINUR 100 (A) 06/24/2021 1357   NITRITE NEGATIVE 06/24/2021 1357   LEUKOCYTESUR MODERATE (A) 06/24/2021 1357   Sepsis Labs Invalid input(s): PROCALCITONIN,  WBC,  LACTICIDVEN Microbiology Recent Results (from the past 240 hour(s))  Urine Culture     Status: Abnormal   Collection Time: 06/24/21  4:26 PM   Specimen: Urine, Clean Catch  Result Value Ref Range Status   Specimen Description   Final    URINE, CLEAN CATCH Performed at [facility], [address], [place], [place] [address]    Special Requests   Final    NONE Performed at [facility], [address], [place], [place] [address]    Culture (A)  Final    <10,000 COLONIES/mL INSIGNIFICANT GROWTH Performed at [facility], [address] [place], [place] [address]    Report Status 06/26/2021 FINAL  Final  Resp Panel by RT-PCR (Flu A&B, Covid) Nasopharyngeal Swab     Status: None   Collection Time: 06/24/21  9:16 PM   Specimen: Nasopharyngeal Swab; Nasopharyngeal(NP) swabs in vial transport medium  Result Value Ref Range Status   SARS Coronavirus 2 by RT PCR NEGATIVE NEGATIVE Final    Comment: (NOTE) SARS-CoV-2 target nucleic acids are NOT DETECTED.  The SARS-CoV-2 RNA is generally detectable in upper  respiratory specimens during the acute phase of infection. The lowest concentration of SARS-CoV-2 viral copies this assay can detect is 138 copies/mL. A negative result does not preclude SARS-Cov-2 infection and should not be used as the sole basis for treatment or other patient management decisions. A negative result may occur with  improper specimen collection/handling, submission of specimen other than nasopharyngeal swab, presence of viral mutation(s) within the areas targeted by this assay, and inadequate number of viral copies(<138 copies/mL). A negative result must be combined with clinical observations, patient history, and epidemiological information. The expected result is Negative.  Fact Sheet for Patients:  BloggerCourse.com  Fact Sheet for Healthcare Providers:  SeriousBroker.it  This test is no t yet approved or cleared by the [place] FDA and  has been authorized for detection and/or diagnosis of SARS-CoV-2 by FDA under an Emergency Use Authorization (EUA). This EUA will remain  in effect (meaning this test can be used) for the duration of the COVID-19 declaration under Section 564(b)(1) of the Act, 21 U.S.C.section 360bbb-3(b)(1), unless the authorization is terminated  or revoked sooner.       Influenza A by PCR NEGATIVE NEGATIVE Final   Influenza B by PCR NEGATIVE NEGATIVE Final    Comment: (NOTE) The Xpert Xpress SARS-CoV-2/FLU/RSV plus assay is intended as an aid in the diagnosis of influenza from Nasopharyngeal swab specimens and should not be used as a sole basis for treatment. Nasal washings and aspirates are unacceptable for Xpert Xpress SARS-CoV-2/FLU/RSV testing.  Fact Sheet for Patients: BloggerCourse.com  Fact Sheet for Healthcare Providers: SeriousBroker.it  This test is not yet approved or cleared by the [place] FDA and has been  authorized for detection and/or diagnosis of SARS-CoV-2 by FDA under an Emergency Use Authorization (EUA). This EUA will remain in effect (meaning this test can be used) for the duration of the COVID-19 declaration under Section 564(b)(1) of the Act, 21 U.S.C. section 360bbb-3(b)(1), unless the authorization is terminated or revoked.  Performed at [facility], [address] [place], [place] [address]     Procedures/Studies: DG Abdomen 1 View  Result Date: 06/24/2021 CLINICAL DATA:  Decreased intake and bilateral lower abdominal pain. EXAM: ABDOMEN - 1 VIEW COMPARISON:  Feb 29, 2020, CT of the abdomen and pelvis of August of 2022. FINDINGS: Visualized lung bases are clear. Signs of aortic atherosclerosis and prior cholecystectomy. Also with evidence of prior partial colonic resection in the area of the rectosigmoid colon. No signs of bowel obstruction. Stool and gas seen in the area of the rectum. Degenerative changes of the lumbar spine. No acute regional skeletal process on limited assessment. IMPRESSION: No acute findings in the abdomen. Postoperative changes as described. Aortic atherosclerosis. Spinal degenerative changes Electronically Signed   By: Donzetta KohutGeoffrey  Wile M.D.   On: 06/24/2021 15:12   CT ABDOMEN PELVIS W CONTRAST  Result Date: 06/16/2021 CLINICAL DATA:  Left-sided abdominal pain for the past few years. Fall smelling urine. EXAM: CT ABDOMEN AND PELVIS WITH CONTRAST TECHNIQUE: Multidetector CT imaging of the abdomen and pelvis was performed using the standard protocol following bolus administration of intravenous contrast. CONTRAST:  60mL OMNIPAQUE IOHEXOL 350 MG/ML SOLN COMPARISON:  02/14/2020 FINDINGS: Lower chest: Clear lung bases. Normal heart size without pericardial or pleural effusion. Hepatobiliary: Central hepatic 3.0 cm cyst. Scattered tiny bilateral hepatic cysts. Cholecystectomy. Intrahepatic ducts upper normal. Common duct upper normal at 1.0 cm on  coronal image 21, similar. Pancreas: Normal pancreas for age, with mild atrophy and borderline duct prominence. Spleen: Normal in size, without focal abnormality. Adrenals/Urinary Tract: Normal adrenal glands. Bilateral renal cysts including up to 1.9 cm. Normal urinary bladder. Stomach/Bowel: Stomach is diffusely underdistended, limiting evaluation. Colonic stool burden suggests constipation. The appendix is identified on 49/3. Prior enterotomy. Normal small bowel caliber. Vascular/Lymphatic: Advanced aortic and branch vessel atherosclerosis. No abdominopelvic adenopathy. Reproductive: Normal uterus and adnexa. Prominent gonadal veins, primarily on the left. Other: No significant free fluid. Moderate pelvic floor laxity. No free intraperitoneal air. Musculoskeletal: No acute osseous abnormality. IMPRESSION: 1.  Possible constipation.  No other explanation for abdominal pain. 2.  Aortic Atherosclerosis (ICD10-I70.0). 3. 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 Electronically Signed   By: Jeronimo GreavesKyle  Talbot M.D.   On: 06/16/2021 06:01   DG Knee Complete 4 Views Left  Result Date: 06/24/2021 CLINICAL DATA:  Left knee pain after falling a few days ago. EXAM: LEFT KNEE - COMPLETE 4+ VIEW COMPARISON:  None. FINDINGS: The mineralization and alignment are normal. There is no evidence of acute fracture or dislocation. There is moderate medial compartment joint space narrowing with osteophytes. The additional joint spaces are preserved. No joint effusion or focal soft tissue abnormality. IMPRESSION: Medial compartment osteoarthritic changes. No acute osseous findings. Electronically Signed   By: Carey BullocksWilliam  Veazey M.D.   On: 06/24/2021 18:40   DG Abd Portable 1V  Result Date: 06/26/2021 CLINICAL DATA:  Abdominal pain EXAM: PORTABLE ABDOMEN - 1 VIEW COMPARISON:  None. FINDINGS: The bowel gas pattern is normal. Degenerative disc disease of the lower lumbar spine. Aortic atherosclerosis.  Cholecystectomy clips. No radio-opaque calculi. IMPRESSION: Nonobstructive bowel gas pattern. Electronically Signed   By: Tacey RuizLeah  Strickland M.D.   On: 06/26/2021 12:25     Time coordinating discharge: Over 30 minutes    Lewie Chamber, MD  [facility] 06/28/2021, 2:43 PM

## 2021-06-28 NOTE — Care Management Important Message (Signed)
Important Message  Patient Details IM Letter given to the Patient. Name: Samantha Gonzales MRN: [id number] Date of Birth: Nov 23, 1940   Medicare Important Message Given:  Yes     Caren Macadam 06/28/2021, 1:58 PM

## 2021-07-27 DEATH — deceased
